# Patient Record
Sex: Female | Born: 1960 | Race: White | Hispanic: No | Smoking: Current some day smoker
Health system: Southern US, Community
[De-identification: ages and names within clinical notes are randomized; demographics above are authoritative.]

## PROBLEM LIST (undated history)

## (undated) DIAGNOSIS — Z5189 Encounter for other specified aftercare: Secondary | ICD-10-CM

## (undated) DIAGNOSIS — J189 Pneumonia, unspecified organism: Secondary | ICD-10-CM

## (undated) DIAGNOSIS — K279 Peptic ulcer, site unspecified, unspecified as acute or chronic, without hemorrhage or perforation: Secondary | ICD-10-CM

## (undated) DIAGNOSIS — M1990 Unspecified osteoarthritis, unspecified site: Secondary | ICD-10-CM

## (undated) DIAGNOSIS — F319 Bipolar disorder, unspecified: Secondary | ICD-10-CM

## (undated) DIAGNOSIS — G894 Chronic pain syndrome: Secondary | ICD-10-CM

## (undated) DIAGNOSIS — R519 Headache, unspecified: Secondary | ICD-10-CM

## (undated) DIAGNOSIS — B1920 Unspecified viral hepatitis C without hepatic coma: Secondary | ICD-10-CM

## (undated) DIAGNOSIS — D72819 Decreased white blood cell count, unspecified: Secondary | ICD-10-CM

## (undated) DIAGNOSIS — K219 Gastro-esophageal reflux disease without esophagitis: Secondary | ICD-10-CM

## (undated) DIAGNOSIS — I10 Essential (primary) hypertension: Secondary | ICD-10-CM

## (undated) DIAGNOSIS — Z8719 Personal history of other diseases of the digestive system: Secondary | ICD-10-CM

## (undated) DIAGNOSIS — R569 Unspecified convulsions: Secondary | ICD-10-CM

## (undated) DIAGNOSIS — T4145XA Adverse effect of unspecified anesthetic, initial encounter: Secondary | ICD-10-CM

## (undated) DIAGNOSIS — F1920 Other psychoactive substance dependence, uncomplicated: Secondary | ICD-10-CM

## (undated) DIAGNOSIS — F419 Anxiety disorder, unspecified: Secondary | ICD-10-CM

## (undated) DIAGNOSIS — R51 Headache: Secondary | ICD-10-CM

## (undated) DIAGNOSIS — T8859XA Other complications of anesthesia, initial encounter: Secondary | ICD-10-CM

## (undated) DIAGNOSIS — D696 Thrombocytopenia, unspecified: Secondary | ICD-10-CM

## (undated) DIAGNOSIS — M810 Age-related osteoporosis without current pathological fracture: Secondary | ICD-10-CM

## (undated) HISTORY — PX: CHOLECYSTECTOMY: SHX55

## (undated) HISTORY — DX: Thrombocytopenia, unspecified: D69.6

## (undated) HISTORY — DX: Gastro-esophageal reflux disease without esophagitis: K21.9

## (undated) HISTORY — PX: SPINE SURGERY: SHX786

## (undated) HISTORY — DX: Decreased white blood cell count, unspecified: D72.819

## (undated) HISTORY — PX: TONSILECTOMY, ADENOIDECTOMY, BILATERAL MYRINGOTOMY AND TUBES: SHX2538

## (undated) HISTORY — PX: ANTERIOR CERVICAL DECOMP/DISCECTOMY FUSION: SHX1161

## (undated) HISTORY — PX: OTHER SURGICAL HISTORY: SHX169

## (undated) HISTORY — DX: Chronic pain syndrome: G89.4

## (undated) HISTORY — DX: Bipolar disorder, unspecified: F31.9

## (undated) HISTORY — DX: Encounter for other specified aftercare: Z51.89

## (undated) HISTORY — PX: APPENDECTOMY: SHX54

## (undated) HISTORY — PX: ABDOMINAL HYSTERECTOMY: SHX81

## (undated) HISTORY — DX: Age-related osteoporosis without current pathological fracture: M81.0

## (undated) HISTORY — DX: Anxiety disorder, unspecified: F41.9

## (undated) HISTORY — DX: Unspecified osteoarthritis, unspecified site: M19.90

## (undated) HISTORY — DX: Unspecified viral hepatitis C without hepatic coma: B19.20

---

## 1999-04-24 ENCOUNTER — Ambulatory Visit (HOSPITAL_COMMUNITY): Admission: RE | Admit: 1999-04-24 | Discharge: 1999-04-24 | Payer: Self-pay | Admitting: *Deleted

## 2000-02-20 ENCOUNTER — Emergency Department (HOSPITAL_COMMUNITY): Admission: EM | Admit: 2000-02-20 | Discharge: 2000-02-20 | Payer: Self-pay | Admitting: Emergency Medicine

## 2005-11-13 ENCOUNTER — Emergency Department (HOSPITAL_COMMUNITY): Admission: EM | Admit: 2005-11-13 | Discharge: 2005-11-13 | Payer: Self-pay | Admitting: Emergency Medicine

## 2006-11-01 ENCOUNTER — Emergency Department (HOSPITAL_COMMUNITY): Admission: EM | Admit: 2006-11-01 | Discharge: 2006-11-01 | Payer: Self-pay | Admitting: Emergency Medicine

## 2007-04-18 ENCOUNTER — Emergency Department (HOSPITAL_COMMUNITY): Admission: EM | Admit: 2007-04-18 | Discharge: 2007-04-18 | Payer: Self-pay | Admitting: Emergency Medicine

## 2007-05-14 ENCOUNTER — Emergency Department (HOSPITAL_COMMUNITY): Admission: EM | Admit: 2007-05-14 | Discharge: 2007-05-14 | Payer: Self-pay | Admitting: Emergency Medicine

## 2007-06-21 ENCOUNTER — Encounter (INDEPENDENT_AMBULATORY_CARE_PROVIDER_SITE_OTHER): Payer: Self-pay | Admitting: Family Medicine

## 2007-06-21 ENCOUNTER — Ambulatory Visit: Payer: Self-pay | Admitting: Internal Medicine

## 2007-06-21 LAB — CONVERTED CEMR LAB
ALT: 75 units/L — ABNORMAL HIGH (ref 0–35)
AST: 75 units/L — ABNORMAL HIGH (ref 0–37)
Albumin: 4.4 g/dL (ref 3.5–5.2)
Basophils Absolute: 0 10*3/uL (ref 0.0–0.1)
Basophils Relative: 0 % (ref 0–1)
CO2: 16 meq/L — ABNORMAL LOW (ref 19–32)
Calcium: 10 mg/dL (ref 8.4–10.5)
Chloride: 108 meq/L (ref 96–112)
Cholesterol: 163 mg/dL (ref 0–200)
Helicobacter Pylori Antibody-IgG: 0.6
Hemoglobin: 14.8 g/dL (ref 12.0–15.0)
Lymphocytes Relative: 39 % (ref 12–46)
MCHC: 33 g/dL (ref 30.0–36.0)
Monocytes Absolute: 0.7 10*3/uL (ref 0.1–1.0)
Neutro Abs: 4.1 10*3/uL (ref 1.7–7.7)
Neutrophils Relative %: 50 % (ref 43–77)
Potassium: 4.4 meq/L (ref 3.5–5.3)
RBC: 4.46 M/uL (ref 3.87–5.11)
RDW: 13.3 % (ref 11.5–15.5)
Total Protein: 8.5 g/dL — ABNORMAL HIGH (ref 6.0–8.3)

## 2007-07-02 ENCOUNTER — Encounter: Admission: RE | Admit: 2007-07-02 | Discharge: 2007-07-02 | Payer: Self-pay | Admitting: Family Medicine

## 2007-09-01 ENCOUNTER — Ambulatory Visit: Payer: Self-pay | Admitting: Family Medicine

## 2007-09-15 ENCOUNTER — Emergency Department (HOSPITAL_COMMUNITY): Admission: EM | Admit: 2007-09-15 | Discharge: 2007-09-15 | Payer: Self-pay | Admitting: Emergency Medicine

## 2007-10-04 ENCOUNTER — Emergency Department (HOSPITAL_COMMUNITY): Admission: EM | Admit: 2007-10-04 | Discharge: 2007-10-04 | Payer: Self-pay | Admitting: Emergency Medicine

## 2007-10-15 ENCOUNTER — Emergency Department (HOSPITAL_COMMUNITY): Admission: EM | Admit: 2007-10-15 | Discharge: 2007-10-15 | Payer: Self-pay | Admitting: Emergency Medicine

## 2007-10-25 ENCOUNTER — Ambulatory Visit: Payer: Self-pay | Admitting: Family Medicine

## 2007-10-25 ENCOUNTER — Emergency Department (HOSPITAL_COMMUNITY): Admission: EM | Admit: 2007-10-25 | Discharge: 2007-10-25 | Payer: Self-pay | Admitting: Family Medicine

## 2007-12-18 ENCOUNTER — Ambulatory Visit: Payer: Self-pay | Admitting: Internal Medicine

## 2007-12-28 ENCOUNTER — Encounter: Admission: RE | Admit: 2007-12-28 | Discharge: 2007-12-28 | Payer: Self-pay | Admitting: Internal Medicine

## 2008-01-11 ENCOUNTER — Inpatient Hospital Stay (HOSPITAL_COMMUNITY): Admission: RE | Admit: 2008-01-11 | Discharge: 2008-01-13 | Payer: Self-pay | Admitting: Orthopedic Surgery

## 2008-04-24 ENCOUNTER — Emergency Department (HOSPITAL_COMMUNITY): Admission: EM | Admit: 2008-04-24 | Discharge: 2008-04-24 | Payer: Self-pay | Admitting: Emergency Medicine

## 2008-04-26 ENCOUNTER — Ambulatory Visit: Payer: Self-pay | Admitting: *Deleted

## 2008-06-23 ENCOUNTER — Ambulatory Visit: Payer: Self-pay | Admitting: Family Medicine

## 2008-07-31 ENCOUNTER — Ambulatory Visit (HOSPITAL_COMMUNITY): Admission: RE | Admit: 2008-07-31 | Discharge: 2008-07-31 | Payer: Self-pay | Admitting: Internal Medicine

## 2008-08-04 ENCOUNTER — Encounter (INDEPENDENT_AMBULATORY_CARE_PROVIDER_SITE_OTHER): Payer: Self-pay | Admitting: Adult Health

## 2008-08-04 ENCOUNTER — Ambulatory Visit: Payer: Self-pay | Admitting: Internal Medicine

## 2008-08-04 LAB — CONVERTED CEMR LAB
Amphetamine Screen, Ur: NEGATIVE
Benzodiazepines.: NEGATIVE
Cocaine Metabolites: NEGATIVE
Marijuana Metabolite: NEGATIVE
Methadone: NEGATIVE
Phencyclidine (PCP): NEGATIVE

## 2008-08-09 ENCOUNTER — Ambulatory Visit (HOSPITAL_COMMUNITY): Admission: RE | Admit: 2008-08-09 | Discharge: 2008-08-09 | Payer: Self-pay | Admitting: Internal Medicine

## 2008-08-11 ENCOUNTER — Emergency Department (HOSPITAL_COMMUNITY): Admission: EM | Admit: 2008-08-11 | Discharge: 2008-08-12 | Payer: Self-pay | Admitting: *Deleted

## 2008-09-07 ENCOUNTER — Ambulatory Visit: Payer: Self-pay | Admitting: Family Medicine

## 2008-09-11 ENCOUNTER — Emergency Department (HOSPITAL_COMMUNITY): Admission: EM | Admit: 2008-09-11 | Discharge: 2008-09-11 | Payer: Self-pay | Admitting: Emergency Medicine

## 2008-11-01 ENCOUNTER — Encounter (INDEPENDENT_AMBULATORY_CARE_PROVIDER_SITE_OTHER): Payer: Self-pay | Admitting: Adult Health

## 2008-11-01 ENCOUNTER — Ambulatory Visit: Payer: Self-pay | Admitting: Internal Medicine

## 2008-11-01 LAB — CONVERTED CEMR LAB
ALT: 49 units/L — ABNORMAL HIGH (ref 0–35)
CO2: 20 meq/L (ref 19–32)
Calcium: 9.6 mg/dL (ref 8.4–10.5)
Chloride: 106 meq/L (ref 96–112)
Creatinine, Ser: 0.76 mg/dL (ref 0.40–1.20)
Eosinophils Absolute: 0.1 10*3/uL (ref 0.0–0.7)
Glucose, Bld: 88 mg/dL (ref 70–99)
Hemoglobin: 13.8 g/dL (ref 12.0–15.0)
Lymphs Abs: 1.9 10*3/uL (ref 0.7–4.0)
MCV: 99 fL (ref 78.0–100.0)
Monocytes Absolute: 0.5 10*3/uL (ref 0.1–1.0)
Monocytes Relative: 9 % (ref 3–12)
Neutrophils Relative %: 54 % (ref 43–77)
RBC: 3.93 M/uL (ref 3.87–5.11)
Sodium: 140 meq/L (ref 135–145)
TSH: 1.912 microintl units/mL (ref 0.350–4.500)
Total Protein: 8.4 g/dL — ABNORMAL HIGH (ref 6.0–8.3)
WBC: 5.4 10*3/uL (ref 4.0–10.5)

## 2008-11-16 ENCOUNTER — Encounter: Admission: RE | Admit: 2008-11-16 | Discharge: 2008-11-16 | Payer: Self-pay | Admitting: Gastroenterology

## 2009-01-04 ENCOUNTER — Encounter: Admission: RE | Admit: 2009-01-04 | Discharge: 2009-01-04 | Payer: Self-pay | Admitting: Specialist

## 2009-01-15 ENCOUNTER — Ambulatory Visit: Payer: Self-pay | Admitting: Internal Medicine

## 2009-01-15 ENCOUNTER — Encounter (INDEPENDENT_AMBULATORY_CARE_PROVIDER_SITE_OTHER): Payer: Self-pay | Admitting: Adult Health

## 2009-01-15 LAB — CONVERTED CEMR LAB
ALT: 61 units/L — ABNORMAL HIGH (ref 0–35)
Albumin: 4.1 g/dL (ref 3.5–5.2)
CO2: 20 meq/L (ref 19–32)
Calcium: 9.2 mg/dL (ref 8.4–10.5)
Chloride: 110 meq/L (ref 96–112)
Cholesterol: 156 mg/dL (ref 0–200)
HCV Quantitative: 2130000 intl units/mL — ABNORMAL HIGH (ref <43)
INR: 1.1 (ref 0.0–1.5)
Sodium: 142 meq/L (ref 135–145)
Total Protein: 8.1 g/dL (ref 6.0–8.3)

## 2009-01-24 ENCOUNTER — Emergency Department (HOSPITAL_COMMUNITY): Admission: EM | Admit: 2009-01-24 | Discharge: 2009-01-24 | Payer: Self-pay | Admitting: Emergency Medicine

## 2009-02-02 ENCOUNTER — Ambulatory Visit (HOSPITAL_COMMUNITY): Admission: RE | Admit: 2009-02-02 | Discharge: 2009-02-02 | Payer: Self-pay | Admitting: Internal Medicine

## 2009-02-12 ENCOUNTER — Ambulatory Visit: Payer: Self-pay | Admitting: Internal Medicine

## 2009-02-13 ENCOUNTER — Encounter: Admission: RE | Admit: 2009-02-13 | Discharge: 2009-02-13 | Payer: Self-pay | Admitting: Gastroenterology

## 2009-03-01 ENCOUNTER — Ambulatory Visit: Payer: Self-pay | Admitting: Gastroenterology

## 2009-03-06 ENCOUNTER — Ambulatory Visit (HOSPITAL_COMMUNITY): Admission: RE | Admit: 2009-03-06 | Discharge: 2009-03-06 | Payer: Self-pay | Admitting: Internal Medicine

## 2009-03-17 ENCOUNTER — Emergency Department (HOSPITAL_COMMUNITY): Admission: EM | Admit: 2009-03-17 | Discharge: 2009-03-18 | Payer: Self-pay | Admitting: Emergency Medicine

## 2009-03-27 ENCOUNTER — Ambulatory Visit: Payer: Self-pay | Admitting: Internal Medicine

## 2009-03-28 ENCOUNTER — Ambulatory Visit (HOSPITAL_COMMUNITY): Admission: RE | Admit: 2009-03-28 | Discharge: 2009-03-28 | Payer: Self-pay | Admitting: Gastroenterology

## 2009-05-03 ENCOUNTER — Ambulatory Visit: Payer: Self-pay | Admitting: Gastroenterology

## 2009-06-04 ENCOUNTER — Ambulatory Visit: Payer: Self-pay | Admitting: Internal Medicine

## 2009-06-04 ENCOUNTER — Encounter (INDEPENDENT_AMBULATORY_CARE_PROVIDER_SITE_OTHER): Payer: Self-pay | Admitting: Adult Health

## 2009-06-04 LAB — CONVERTED CEMR LAB
Barbiturate Quant, Ur: NEGATIVE
Cocaine Metabolites: NEGATIVE
Creatinine,U: 316.9 mg/dL
Phencyclidine (PCP): NEGATIVE

## 2009-06-14 ENCOUNTER — Ambulatory Visit: Payer: Self-pay | Admitting: Internal Medicine

## 2009-06-14 ENCOUNTER — Ambulatory Visit: Payer: Self-pay | Admitting: Gastroenterology

## 2009-06-14 ENCOUNTER — Encounter (INDEPENDENT_AMBULATORY_CARE_PROVIDER_SITE_OTHER): Payer: Self-pay | Admitting: Adult Health

## 2009-06-14 LAB — CONVERTED CEMR LAB
ALT: 54 units/L — ABNORMAL HIGH (ref 0–35)
CO2: 21 meq/L (ref 19–32)
Chloride: 105 meq/L (ref 96–112)
Sodium: 136 meq/L (ref 135–145)
Total Bilirubin: 0.5 mg/dL (ref 0.3–1.2)
Total Protein: 7.5 g/dL (ref 6.0–8.3)

## 2009-07-31 ENCOUNTER — Ambulatory Visit (HOSPITAL_COMMUNITY): Admission: RE | Admit: 2009-07-31 | Discharge: 2009-07-31 | Payer: Self-pay | Admitting: *Deleted

## 2009-09-20 ENCOUNTER — Inpatient Hospital Stay (HOSPITAL_COMMUNITY): Admission: RE | Admit: 2009-09-20 | Discharge: 2009-09-21 | Payer: Self-pay | Admitting: *Deleted

## 2009-10-09 ENCOUNTER — Encounter: Admission: RE | Admit: 2009-10-09 | Discharge: 2009-10-09 | Payer: Self-pay | Admitting: *Deleted

## 2009-10-30 ENCOUNTER — Ambulatory Visit (HOSPITAL_COMMUNITY): Admission: RE | Admit: 2009-10-30 | Discharge: 2009-10-30 | Payer: Self-pay | Admitting: Gastroenterology

## 2009-11-15 ENCOUNTER — Ambulatory Visit (HOSPITAL_COMMUNITY): Admission: RE | Admit: 2009-11-15 | Discharge: 2009-11-15 | Payer: Self-pay | Admitting: *Deleted

## 2009-11-16 ENCOUNTER — Ambulatory Visit: Payer: Self-pay | Admitting: Gastroenterology

## 2009-12-20 ENCOUNTER — Ambulatory Visit: Payer: Self-pay | Admitting: Internal Medicine

## 2010-02-22 ENCOUNTER — Emergency Department (HOSPITAL_COMMUNITY): Admission: EM | Admit: 2010-02-22 | Discharge: 2010-02-22 | Payer: Self-pay | Admitting: Emergency Medicine

## 2010-02-28 ENCOUNTER — Ambulatory Visit: Payer: Self-pay | Admitting: Cardiology

## 2010-02-28 ENCOUNTER — Observation Stay (HOSPITAL_COMMUNITY): Admission: EM | Admit: 2010-02-28 | Discharge: 2010-03-01 | Payer: Self-pay | Admitting: Emergency Medicine

## 2010-03-07 ENCOUNTER — Emergency Department (HOSPITAL_COMMUNITY)
Admission: EM | Admit: 2010-03-07 | Discharge: 2010-03-07 | Payer: Self-pay | Source: Home / Self Care | Admitting: Emergency Medicine

## 2010-03-21 ENCOUNTER — Ambulatory Visit: Payer: Self-pay | Admitting: Oncology

## 2010-03-22 LAB — CBC WITH DIFFERENTIAL/PLATELET
BASO%: 0.3 % (ref 0.0–2.0)
EOS%: 0.5 % (ref 0.0–7.0)
MCH: 37.9 pg — ABNORMAL HIGH (ref 25.1–34.0)
MCHC: 34.2 g/dL (ref 31.5–36.0)
MCV: 110.9 fL — ABNORMAL HIGH (ref 79.5–101.0)
MONO%: 10.8 % (ref 0.0–14.0)
NEUT#: 1.8 10*3/uL (ref 1.5–6.5)
RBC: 3.93 10*6/uL (ref 3.70–5.45)
RDW: 13.7 % (ref 11.2–14.5)
nRBC: 0 % (ref 0–0)

## 2010-03-22 LAB — MORPHOLOGY

## 2010-03-25 ENCOUNTER — Encounter (INDEPENDENT_AMBULATORY_CARE_PROVIDER_SITE_OTHER): Payer: Self-pay | Admitting: *Deleted

## 2010-03-25 LAB — CONVERTED CEMR LAB
Barbiturate Quant, Ur: POSITIVE — AB
Benzodiazepines.: NEGATIVE
Marijuana Metabolite: NEGATIVE
Methadone: NEGATIVE
Opiate Screen, Urine: POSITIVE — AB
Propoxyphene: NEGATIVE

## 2010-03-26 LAB — COMPREHENSIVE METABOLIC PANEL
Albumin: 4.1 g/dL (ref 3.5–5.2)
BUN: 4 mg/dL — ABNORMAL LOW (ref 6–23)
CO2: 23 mEq/L (ref 19–32)
Calcium: 9.2 mg/dL (ref 8.4–10.5)
Chloride: 107 mEq/L (ref 96–112)
Creatinine, Ser: 0.56 mg/dL (ref 0.40–1.20)
Potassium: 3.9 mEq/L (ref 3.5–5.3)

## 2010-03-26 LAB — PROTEIN ELECTROPHORESIS, SERUM
Albumin ELP: 46.9 % — ABNORMAL LOW (ref 55.8–66.1)
Beta Globulin: 5.5 % (ref 4.7–7.2)
Total Protein, Serum Electrophoresis: 7.6 g/dL (ref 6.0–8.3)

## 2010-03-26 LAB — HEPATITIS B SURFACE ANTIBODY,QUALITATIVE: Hep B S Ab: NEGATIVE

## 2010-03-26 LAB — HEPATITIS B CORE ANTIBODY, TOTAL: Hep B Core Total Ab: NEGATIVE

## 2010-03-26 LAB — LACTATE DEHYDROGENASE: LDH: 155 U/L (ref 94–250)

## 2010-03-26 LAB — VITAMIN B12: Vitamin B-12: 389 pg/mL (ref 211–911)

## 2010-03-26 LAB — HEPATITIS B SURFACE ANTIGEN: Hepatitis B Surface Ag: NEGATIVE

## 2010-04-03 ENCOUNTER — Encounter: Admission: RE | Admit: 2010-04-03 | Discharge: 2010-04-03 | Payer: Self-pay | Admitting: Gastroenterology

## 2010-04-12 ENCOUNTER — Ambulatory Visit (HOSPITAL_COMMUNITY): Admission: RE | Admit: 2010-04-12 | Discharge: 2010-04-12 | Payer: Self-pay | Admitting: Oncology

## 2010-04-29 ENCOUNTER — Ambulatory Visit (HOSPITAL_COMMUNITY)
Admission: RE | Admit: 2010-04-29 | Discharge: 2010-04-29 | Payer: Self-pay | Source: Home / Self Care | Admitting: Family Medicine

## 2010-05-01 ENCOUNTER — Ambulatory Visit (HOSPITAL_BASED_OUTPATIENT_CLINIC_OR_DEPARTMENT_OTHER): Payer: Medicare Other | Admitting: Oncology

## 2010-05-03 LAB — CBC WITH DIFFERENTIAL/PLATELET
Basophils Absolute: 0 10*3/uL (ref 0.0–0.1)
EOS%: 1.2 % (ref 0.0–7.0)
HGB: 12.5 g/dL (ref 11.6–15.9)
MCH: 37.1 pg — ABNORMAL HIGH (ref 25.1–34.0)
MCV: 110.4 fL — ABNORMAL HIGH (ref 79.5–101.0)
MONO%: 13.3 % (ref 0.0–14.0)
RBC: 3.37 10*6/uL — ABNORMAL LOW (ref 3.70–5.45)
RDW: 13.1 % (ref 11.2–14.5)

## 2010-05-26 HISTORY — PX: JOINT REPLACEMENT: SHX530

## 2010-06-16 ENCOUNTER — Encounter: Payer: Self-pay | Admitting: Orthopedic Surgery

## 2010-06-16 ENCOUNTER — Encounter: Payer: Self-pay | Admitting: Gastroenterology

## 2010-06-17 ENCOUNTER — Encounter: Payer: Self-pay | Admitting: Internal Medicine

## 2010-06-17 ENCOUNTER — Encounter: Payer: Self-pay | Admitting: Gastroenterology

## 2010-07-01 ENCOUNTER — Ambulatory Visit: Payer: Medicare Other | Admitting: Oncology

## 2010-07-01 DIAGNOSIS — D696 Thrombocytopenia, unspecified: Secondary | ICD-10-CM

## 2010-07-01 LAB — CBC WITH DIFFERENTIAL/PLATELET
Basophils Absolute: 0 10*3/uL (ref 0.0–0.1)
HCT: 36.8 % (ref 34.8–46.6)
HGB: 12.6 g/dL (ref 11.6–15.9)
MONO#: 0.4 10*3/uL (ref 0.1–0.9)
NEUT#: 4.4 10*3/uL (ref 1.5–6.5)
NEUT%: 69.5 % (ref 38.4–76.8)
RDW: 12.9 % (ref 11.2–14.5)
WBC: 6.3 10*3/uL (ref 3.9–10.3)
lymph#: 1.4 10*3/uL (ref 0.9–3.3)

## 2010-07-01 LAB — MORPHOLOGY: PLT EST: DECREASED

## 2010-07-25 ENCOUNTER — Encounter: Payer: Self-pay | Admitting: Internal Medicine

## 2010-07-25 LAB — CONVERTED CEMR LAB
Creatinine,U: 108.5 mg/dL
Methadone: NEGATIVE
Opiate Screen, Urine: POSITIVE — AB
Phencyclidine (PCP): NEGATIVE
Propoxyphene: NEGATIVE

## 2010-08-06 LAB — CBC
Hemoglobin: 12.4 g/dL (ref 12.0–15.0)
MCHC: 35.2 g/dL (ref 30.0–36.0)
Platelets: 80 10*3/uL — ABNORMAL LOW (ref 150–400)
RDW: 13.3 % (ref 11.5–15.5)

## 2010-08-06 LAB — PROTIME-INR
INR: 1.1 (ref 0.00–1.49)
Prothrombin Time: 14.4 seconds (ref 11.6–15.2)

## 2010-08-07 LAB — POCT CARDIAC MARKERS
CKMB, poc: <1 ng/mL — ABNORMAL LOW (ref 1.0–8.0)
Myoglobin, poc: 50.6 ng/mL (ref 12–200)
Troponin i, poc: <0.05 ng/mL (ref 0.00–0.09)

## 2010-08-08 LAB — COMPREHENSIVE METABOLIC PANEL
ALT: 39 U/L — ABNORMAL HIGH (ref 0–35)
AST: 60 U/L — ABNORMAL HIGH (ref 0–37)
CO2: 26 mEq/L (ref 19–32)
Calcium: 8.7 mg/dL (ref 8.4–10.5)
Creatinine, Ser: 0.6 mg/dL (ref 0.4–1.2)
GFR calc Af Amer: >60 mL/min (ref >60)
GFR calc non Af Amer: >60 mL/min (ref >60)
Sodium: 140 mEq/L (ref 135–145)
Total Protein: 6.8 g/dL (ref 6.0–8.3)

## 2010-08-08 LAB — DIFFERENTIAL
Basophils Relative: 0 % (ref 0–1)
Eosinophils Absolute: 0 10*3/uL (ref 0.0–0.7)
Eosinophils Relative: 1 % (ref 0–5)
Lymphocytes Relative: 51 % — ABNORMAL HIGH (ref 12–46)
Neutrophils Relative %: 39 % — ABNORMAL LOW (ref 43–77)

## 2010-08-08 LAB — CBC
Hemoglobin: 11.6 g/dL — ABNORMAL LOW (ref 12.0–15.0)
Hemoglobin: 13.8 g/dL (ref 12.0–15.0)
MCH: 38.3 pg — ABNORMAL HIGH (ref 26.0–34.0)
MCHC: 34.5 g/dL (ref 30.0–36.0)
MCHC: 34.9 g/dL (ref 30.0–36.0)
Platelets: 77 10*3/uL — ABNORMAL LOW (ref 150–400)
Platelets: 90 10*3/uL — ABNORMAL LOW (ref 150–400)
RBC: 3.56 MIL/uL — ABNORMAL LOW (ref 3.87–5.11)
RDW: 15.5 % (ref 11.5–15.5)

## 2010-08-08 LAB — POCT I-STAT, CHEM 8
BUN: 3 mg/dL — ABNORMAL LOW (ref 6–23)
Calcium, Ion: 1.17 mmol/L (ref 1.12–1.32)
Creatinine, Ser: 0.5 mg/dL (ref 0.4–1.2)
Hemoglobin: 14.6 g/dL (ref 12.0–15.0)
Sodium: 141 mEq/L (ref 135–145)
TCO2: 25 mmol/L (ref 0–100)

## 2010-08-08 LAB — CARDIAC PANEL(CRET KIN+CKTOT+MB+TROPI)
CK, MB: 0.6 ng/mL (ref 0.3–4.0)
CK, MB: 0.7 ng/mL (ref 0.3–4.0)
Relative Index: INVALID (ref 0.0–2.5)
Relative Index: INVALID (ref 0.0–2.5)
Total CK: 58 U/L (ref 7–177)
Total CK: 63 U/L (ref 7–177)
Troponin I: 0.02 ng/mL (ref 0.00–0.06)
Troponin I: 0.02 ng/mL (ref 0.00–0.06)

## 2010-08-08 LAB — MAGNESIUM: Magnesium: 1.7 mg/dL (ref 1.5–2.5)

## 2010-08-08 LAB — POCT CARDIAC MARKERS: Myoglobin, poc: 53 ng/mL (ref 12–200)

## 2010-08-08 LAB — LIPID PANEL
LDL Cholesterol: 48 mg/dL (ref 0–99)
Total CHOL/HDL Ratio: 4.5 RATIO
VLDL: 60 mg/dL — ABNORMAL HIGH (ref 0–40)

## 2010-08-08 LAB — CK TOTAL AND CKMB (NOT AT ARMC): Total CK: 51 U/L (ref 7–177)

## 2010-08-08 LAB — PHOSPHORUS: Phosphorus: 2.6 mg/dL (ref 2.3–4.6)

## 2010-08-13 LAB — CBC
HCT: 40.2 % (ref 36.0–46.0)
Hemoglobin: 14.2 g/dL (ref 12.0–15.0)
MCV: 103.9 fL — ABNORMAL HIGH (ref 78.0–100.0)
RDW: 13.9 % (ref 11.5–15.5)

## 2010-08-13 LAB — URINALYSIS, ROUTINE W REFLEX MICROSCOPIC
Bilirubin Urine: NEGATIVE
Glucose, UA: NEGATIVE mg/dL
Hgb urine dipstick: NEGATIVE
Nitrite: NEGATIVE
Specific Gravity, Urine: 1.019 (ref 1.005–1.030)
pH: 6 (ref 5.0–8.0)

## 2010-08-13 LAB — COMPREHENSIVE METABOLIC PANEL
Alkaline Phosphatase: 73 U/L (ref 39–117)
BUN: 4 mg/dL — ABNORMAL LOW (ref 6–23)
Chloride: 105 mEq/L (ref 96–112)
Creatinine, Ser: 0.63 mg/dL (ref 0.4–1.2)
Glucose, Bld: 95 mg/dL (ref 70–99)
Potassium: 4.3 mEq/L (ref 3.5–5.1)
Total Bilirubin: 0.6 mg/dL (ref 0.3–1.2)

## 2010-08-13 LAB — APTT: aPTT: 31 seconds (ref 24–37)

## 2010-08-13 LAB — PROTIME-INR
INR: 1.08 (ref 0.00–1.49)
Prothrombin Time: 13.9 seconds (ref 11.6–15.2)

## 2010-08-13 LAB — DIFFERENTIAL
Basophils Absolute: 0 10*3/uL (ref 0.0–0.1)
Basophils Relative: 1 % (ref 0–1)
Lymphocytes Relative: 51 % — ABNORMAL HIGH (ref 12–46)
Neutro Abs: 1.1 10*3/uL — ABNORMAL LOW (ref 1.7–7.7)
Neutrophils Relative %: 38 % — ABNORMAL LOW (ref 43–77)

## 2010-08-13 LAB — TYPE AND SCREEN: ABO/RH(D): A POS

## 2010-08-28 LAB — PROTIME-INR
INR: 1.1 (ref 0.00–1.49)
Prothrombin Time: 14.1 seconds (ref 11.6–15.2)

## 2010-08-28 LAB — CBC
HCT: 37.7 % (ref 36.0–46.0)
Hemoglobin: 13.2 g/dL (ref 12.0–15.0)
MCHC: 35 g/dL (ref 30.0–36.0)
MCV: 103.5 fL — ABNORMAL HIGH (ref 78.0–100.0)
RBC: 3.64 MIL/uL — ABNORMAL LOW (ref 3.87–5.11)

## 2010-08-30 LAB — CBC
MCHC: 34.6 g/dL (ref 30.0–36.0)
MCV: 103 fL — ABNORMAL HIGH (ref 78.0–100.0)
Platelets: 108 10*3/uL — ABNORMAL LOW (ref 150–400)
RDW: 13.8 % (ref 11.5–15.5)

## 2010-08-30 LAB — COMPREHENSIVE METABOLIC PANEL
AST: 63 U/L — ABNORMAL HIGH (ref 0–37)
Albumin: 3.8 g/dL (ref 3.5–5.2)
BUN: 6 mg/dL (ref 6–23)
Calcium: 9.8 mg/dL (ref 8.4–10.5)
Creatinine, Ser: 0.64 mg/dL (ref 0.4–1.2)
GFR calc Af Amer: >60 mL/min (ref >60)

## 2010-08-30 LAB — DIFFERENTIAL
Eosinophils Relative: 1 % (ref 0–5)
Lymphocytes Relative: 31 % (ref 12–46)
Lymphs Abs: 1.3 10*3/uL (ref 0.7–4.0)
Monocytes Absolute: 0.4 10*3/uL (ref 0.1–1.0)
Neutro Abs: 2.5 10*3/uL (ref 1.7–7.7)

## 2010-08-30 LAB — URINALYSIS, ROUTINE W REFLEX MICROSCOPIC
Bilirubin Urine: NEGATIVE
Hgb urine dipstick: NEGATIVE
Ketones, ur: NEGATIVE mg/dL
Protein, ur: NEGATIVE mg/dL
Urobilinogen, UA: 0.2 mg/dL (ref 0.0–1.0)

## 2010-08-30 LAB — HEMOCCULT GUIAC POC 1CARD (OFFICE): Fecal Occult Bld: NEGATIVE

## 2010-09-04 LAB — COMPREHENSIVE METABOLIC PANEL
ALT: 40 U/L — ABNORMAL HIGH (ref 0–35)
Alkaline Phosphatase: 83 U/L (ref 39–117)
BUN: 8 mg/dL (ref 6–23)
CO2: 27 mEq/L (ref 19–32)
GFR calc non Af Amer: >60 mL/min (ref >60)
Glucose, Bld: 98 mg/dL (ref 70–99)
Potassium: 3.7 mEq/L (ref 3.5–5.1)
Sodium: 141 mEq/L (ref 135–145)
Total Bilirubin: 1 mg/dL (ref 0.3–1.2)
Total Protein: 8.2 g/dL (ref 6.0–8.3)

## 2010-09-04 LAB — URINALYSIS, ROUTINE W REFLEX MICROSCOPIC
Glucose, UA: NEGATIVE mg/dL
Ketones, ur: 40 mg/dL — AB
Nitrite: NEGATIVE
Protein, ur: NEGATIVE mg/dL
Urobilinogen, UA: 1 mg/dL (ref 0.0–1.0)

## 2010-09-04 LAB — CBC
HCT: 41.8 % (ref 36.0–46.0)
Hemoglobin: 14.6 g/dL (ref 12.0–15.0)
MCHC: 34.8 g/dL (ref 30.0–36.0)
RBC: 4.15 MIL/uL (ref 3.87–5.11)
RDW: 14 % (ref 11.5–15.5)

## 2010-09-04 LAB — DIFFERENTIAL
Basophils Absolute: 0 10*3/uL (ref 0.0–0.1)
Basophils Relative: 0 % (ref 0–1)
Eosinophils Absolute: 0 10*3/uL (ref 0.0–0.7)
Monocytes Relative: 8 % (ref 3–12)
Neutro Abs: 3.2 10*3/uL (ref 1.7–7.7)
Neutrophils Relative %: 61 % (ref 43–77)

## 2010-09-04 LAB — LIPASE, BLOOD: Lipase: 17 U/L (ref 11–59)

## 2010-09-04 LAB — HEMOCCULT GUIAC POC 1CARD (OFFICE): Fecal Occult Bld: POSITIVE

## 2010-09-04 LAB — APTT: aPTT: 28 seconds (ref 24–37)

## 2010-09-05 LAB — POCT CARDIAC MARKERS
Myoglobin, poc: 62.1 ng/mL (ref 12–200)
Troponin i, poc: <0.05 ng/mL (ref 0.00–0.09)

## 2010-09-05 LAB — DIFFERENTIAL
Basophils Relative: 0 % (ref 0–1)
Eosinophils Absolute: 0 10*3/uL (ref 0.0–0.7)
Eosinophils Relative: 1 % (ref 0–5)
Lymphs Abs: 1.4 10*3/uL (ref 0.7–4.0)
Monocytes Relative: 8 % (ref 3–12)

## 2010-09-05 LAB — CBC
HCT: 40.2 % (ref 36.0–46.0)
MCHC: 35 g/dL (ref 30.0–36.0)
MCV: 100.2 fL — ABNORMAL HIGH (ref 78.0–100.0)
Platelets: 128 10*3/uL — ABNORMAL LOW (ref 150–400)
WBC: 4.9 10*3/uL (ref 4.0–10.5)

## 2010-09-05 LAB — BASIC METABOLIC PANEL
BUN: 9 mg/dL (ref 6–23)
CO2: 23 mEq/L (ref 19–32)
Chloride: 103 mEq/L (ref 96–112)
Creatinine, Ser: 0.62 mg/dL (ref 0.4–1.2)
Potassium: 3.6 mEq/L (ref 3.5–5.1)

## 2010-09-12 ENCOUNTER — Encounter (HOSPITAL_BASED_OUTPATIENT_CLINIC_OR_DEPARTMENT_OTHER): Payer: Medicare Other | Admitting: Oncology

## 2010-09-12 ENCOUNTER — Other Ambulatory Visit: Payer: Self-pay | Admitting: Oncology

## 2010-09-12 DIAGNOSIS — K7460 Unspecified cirrhosis of liver: Secondary | ICD-10-CM

## 2010-09-12 DIAGNOSIS — D72819 Decreased white blood cell count, unspecified: Secondary | ICD-10-CM

## 2010-09-12 DIAGNOSIS — D696 Thrombocytopenia, unspecified: Secondary | ICD-10-CM

## 2010-09-12 DIAGNOSIS — B1920 Unspecified viral hepatitis C without hepatic coma: Secondary | ICD-10-CM

## 2010-09-12 LAB — MORPHOLOGY: RBC Comments: NORMAL

## 2010-09-12 LAB — COMPREHENSIVE METABOLIC PANEL
AST: 48 U/L — ABNORMAL HIGH (ref 0–37)
Alkaline Phosphatase: 82 U/L (ref 39–117)
BUN: 6 mg/dL (ref 6–23)
Calcium: 9.4 mg/dL (ref 8.4–10.5)
Chloride: 105 mEq/L (ref 96–112)
Creatinine, Ser: 0.68 mg/dL (ref 0.40–1.20)
Total Bilirubin: 0.5 mg/dL (ref 0.3–1.2)

## 2010-09-12 LAB — CBC WITH DIFFERENTIAL/PLATELET
BASO%: 0.5 % (ref 0.0–2.0)
Eosinophils Absolute: 0.1 10*3/uL (ref 0.0–0.5)
MCHC: 33.9 g/dL (ref 31.5–36.0)
MONO#: 0.3 10*3/uL (ref 0.1–0.9)
NEUT#: 1.9 10*3/uL (ref 1.5–6.5)
RBC: 3.84 10*6/uL (ref 3.70–5.45)
RDW: 14.3 % (ref 11.2–14.5)
WBC: 3.7 10*3/uL — ABNORMAL LOW (ref 3.9–10.3)
lymph#: 1.4 10*3/uL (ref 0.9–3.3)
nRBC: 0 % (ref 0–0)

## 2010-09-12 LAB — CHCC SMEAR

## 2010-10-01 ENCOUNTER — Ambulatory Visit: Payer: No Typology Code available for payment source | Admitting: Physical Therapy

## 2010-10-08 NOTE — Op Note (Signed)
[name], [name]                 ACCOUNT NO.:  [id number]   MEDICAL RECORD NO.:  [id number]          PATIENT TYPE:  INP   LOCATION:  1611                         FACILITY:  [facility]   PHYSICIAN:  Madlyn Frankel. Charlann Boxer, M.D.  DATE OF BIRTH:  [birth year]   DATE OF PROCEDURE:  01/11/2008  DATE OF DISCHARGE:                               OPERATIVE REPORT   PREOPERATIVE DIAGNOSIS:  Right hip avascular necrosis.   POSTOPERATIVE DIAGNOSIS:  Right hip avascular necrosis.   PROCEDURE:  Right total hip replacement.   COMPONENTS USED:  DePuy hip system, size 50 pinnacle cup, 36 metal liner  neutral.  A Tri-Lock size 8 high offset stem with a 36+5 ball.   SURGEON:  Madlyn Frankel. Charlann Boxer, M.D.   ASSISTANT:  Yetta Glassman. Mann, PA.   ANESTHESIA:  General.   BLOOD LOSS:  300 mL.   DRAINS:  One.   COMPLICATIONS:  None.   INDICATION FOR PROCEDURE:  [name] is a 50 year old female who  presented to the office for evaluation of bilateral right greater than  left hip pain with radiographic changes consistent with avascular  necrosis with advanced collapse.  She had significant pain.  She was  initially seen and evaluated earlier this year.  Once she was able to  establish some balance in her life, including a significant reduction in  the narcotic use, getting medical clearance and getting things  straightened, she was ready to proceed with surgery.  We had reviewed  the risks of infection, DVT, component failure, dislocation, need for  revision surgery for any issues.  We discussed her age and the bearing  surfaces available.  She consented for a metal-on-metal total hip  replacement.  Consent obtained.   PROCEDURE IN DETAIL:  The patient was brought to the operative theater.  Once adequate anesthesia, preoperative antibiotics, Ancef, administered,  the patient was positioned in the left lateral decubitus position with  the right side up.  Once positioned and bony prominence padded, she was  identified through a timeout.  The right lateral hip was pre-scrubbed  and prepped and draped in a sterile fashion.  A lateral based incision  was made for a posterior approach to the hip.  The iliotibial band and  gluteus fascia were then incised posteriorly.  The short external  rotators were taken down separate from the posterior capsule.  An L  capsulotomy was made, preserving the posterior leaflet for later  anatomic repair, as well as protection of the sciatic nerve from  retractors.  The hip was dislocated and severe avascular changes noted  in the femoral head.  A neck osteotomy was made based off anatomic  landmarks and preoperative templating.   Attention was first directed to the femur, where I had used a starting  drill to open the canal, irrigated the canal to prevent fat emboli.  I  then began broaching, initially broaching to a size 6.  This was at the  level of my neck cut.  I then used the calcar miller just to debride  some of the anterior bone left.  I  then packed off the femur with a  sponge and now attended to the acetabulum.  Following acetabular  exposure and placement of retractors, the labrum was removed.  I began  reaming with a 44 reamer, then 46, then 47 and 49.  There was very good  bone preparation.  Her acetabulum was a bit on the shallow side, but was  there still good coverage.   At this point, the final 50 mm pinnacle cup was impacted in  approximately 35-40 degrees of abduction and 20 degrees of forward  flexion.  It was beneath the anterior wall anteriorly at the level of  the ischium posteriorly.  With this, I placed two cancellous screws and  a neutral trial liner.  At this point, I did a trial reduction with 6  broach at the level where it sat at the level of the neck cut, using a  high offset neck and a 36+1.5 ball.  The combined anteversion was very  good at 45-50 degrees, but there was a significant amount of shuck  indicating length inequality.   At this point, I went ahead and removed  the trial components.  Based on my combined anteversion that was set, I  went ahead and placed a central hole eliminator in the acetabulum and  impacted the final 36 x 50 mm pinnacle metal liner.   This was done without complication.   At this point, I broached to a 7, which sat basically at the level where  the neck cut was.  Then I used __________needed a little bit of length.  I went ahead and did a trial reduction.  Based on this, I chose the 8  high offset stem.  I then impacted this.  It sat about 2-3 mm proud of  my neck cut, giving me a little bit of length.  I trialed again with a  +1.5 and then a 5 ball.  Again, the stability was very good with a  combined anteversion as previously noted and there was only 1-2 mm of  shuck.  The leg lengths appeared to be very similar to the way they were  positioned at the preoperative portion of the case.  Given this, I chose  a 36+5 ball.  It was impacted onto the clean and dry trunnion.   At this point, the hip was irrigated.  I placed a medium Hemovac drain  deep.  I reapproximated t6 posterior capsule to the superior leaflet  using #1 Ethibond.  The remaining wound was closed with #1 Vicryl on the  iliotibial band and running on the gluteal fascia.  A 2-0 Vicryl was  used in the subcu layer and a running 4-0 Monocryl on the skin.  The  skin was cleaned, dried and dressed sterilely with Steri-Strips and a  sterile wrap.  She was transferred from the recovery room, extubated in  stable condition, tolerating the procedure well.      Madlyn Frankel Charlann Boxer, M.D.  Electronically Signed     MDO/MEDQ  D:  01/11/2008  T:  01/12/2008  Job:  [id number]

## 2010-10-08 NOTE — H&P (Signed)
[name], [name]                 ACCOUNT NO.:  [id number]   MEDICAL RECORD NO.:  [id number]          PATIENT TYPE:  INP   LOCATION:  NA                           FACILITY:  [facility]   PHYSICIAN:  Madlyn Frankel. Charlann Boxer, M.D.  DATE OF BIRTH:  11/06/1960   DATE OF ADMISSION:  DATE OF DISCHARGE:                              HISTORY & PHYSICAL   PROCEDURE:  Right total hip arthroplasty.   ATTENDING PHYSICIAN:  Dr. Durene Romans.   CHIEF COMPLAINTS:  Right hip pain.   HISTORY OF PRESENT ILLNESS:  A 50 year old female with a history of  right hip pain secondary to avascular necrosis refractory to all  conservative treatment.  She has been presurgically assessed by her  primary care physician, Dr. Donia Guiles.   PAST MEDICAL HISTORY:  Includes:  1. Osteoarthritis.  2. Bipolar.   PAST SURGICAL HISTORY:  1. Hysterectomy.  2. Cholecystectomy,.  3. Appendectomy.   FAMILY HISTORY:  Coronary artery disease, breast cancer.   SOCIAL HISTORY:  Divorced.  Primary caregiver after surgery will be  family member in the home.   DRUG ALLERGIES:  ASPIRIN.   CURRENT MEDICATIONS:  Seroquel 400 mg 1 p.o. every night.   REVIEW OF SYSTEMS:  GENERAL:  She has some loss of memory, night sweats  and fatigue.  GASTROINTESTINAL:  She has intermittent heartburn.  MUSCULOSKELETAL:  Multiple joint pains, some back pain, morning  stiffness as well as muscular weakness.  Otherwise see HPI.   PHYSICAL EXAMINATION:  Pulse 72, respirations 16, blood pressure 122/84.  Height 5 feet 8, weight 189 pounds.  GENERAL:  Awake, alert and oriented, well-developed, well-nourished, no  acute distress.  NECK:  Supple.  No carotid bruits.  CHEST:  Lungs are clear to auscultation bilaterally.  BREASTS:  Deferred.  HEART:  Regular rate and rhythm.  S1-S2 distinct.  ABDOMEN:  Soft, nontender, bowel sounds present.  GENITOURINARY:  Deferred.  EXTREMITIES:  Right hip has increased pain with decreased range of  motion.  SKIN:   No cellulitis.  NEUROLOGIC:  Intact distal sensibilities.   Labs, EKG, chest x-ray all pending presurgical testing.   IMPRESSION:  Right hip avascular necrosis.   PLAN OF ACTION:  Right total hip arthroplasty at [facility]  January 11, 2008 by surgeon Dr. Durene Romans.  Risks and complications  were discussed.   Postoperative medications were provided which include Coumadin for DVT  prophylaxis, Robaxin, iron, Colace and MiraLax.  Pain medicines to be  dispensed in conjunction with the pain medicine contract.     ______________________________  [name]. [name], [name]      Madlyn Frankel. Charlann Boxer, M.D.  Electronically Signed    BLM/MEDQ  D:  01/07/2008  T:  01/07/2008  Job:  [id number]   cc:   Dineen Kid. Reche Dixon, M.D.  Fax: [phone]

## 2010-10-10 ENCOUNTER — Ambulatory Visit: Payer: No Typology Code available for payment source | Attending: Orthopedic Surgery | Admitting: Physical Therapy

## 2010-10-10 DIAGNOSIS — M542 Cervicalgia: Secondary | ICD-10-CM | POA: Insufficient documentation

## 2010-10-10 DIAGNOSIS — M2560 Stiffness of unspecified joint, not elsewhere classified: Secondary | ICD-10-CM | POA: Insufficient documentation

## 2010-10-10 DIAGNOSIS — IMO0001 Reserved for inherently not codable concepts without codable children: Secondary | ICD-10-CM | POA: Insufficient documentation

## 2010-10-10 DIAGNOSIS — C228 Malignant neoplasm of liver, primary, unspecified as to type: Secondary | ICD-10-CM | POA: Insufficient documentation

## 2010-10-10 DIAGNOSIS — M545 Low back pain, unspecified: Secondary | ICD-10-CM | POA: Insufficient documentation

## 2010-10-10 DIAGNOSIS — R209 Unspecified disturbances of skin sensation: Secondary | ICD-10-CM | POA: Insufficient documentation

## 2010-10-11 NOTE — Discharge Summary (Signed)
NAMEDALEYZA, GADOMSKI                 ACCOUNT NO.:  [id number]   MEDICAL RECORD NO.:  [id number]          PATIENT TYPE:  INP   LOCATION:  1611                         FACILITY:  [facility]   PHYSICIAN:  Madlyn Frankel. Charlann Boxer, M.D.  DATE OF BIRTH:  12-11-1960   DATE OF ADMISSION:  01/11/2008  DATE OF DISCHARGE:  01/13/2008                               DISCHARGE SUMMARY   ADMITTING DIAGNOSES:  1. Avascular necrosis.  2. Bipolar disease.   DISCHARGE DIAGNOSIS:  1. Avascular necrosis.  2. Bipolar disease.   HISTORY OF PRESENT ILLNESS:  A 50 year old female with a history of  right hip and groin pain secondary to osteoarthritis and avascular  necrosis.  Refractory to all conservative treatment.   CONSULTANTS:  Pharmacy Coumadin.   PROCEDURE:  Was right total hip replacement by surgeon Dr. Durene Romans.  Assistant [employer] PA-C.   LABS UPON ADMISSION:  CBC:  Hemoglobin 14.6, hematocrit 43.1, platelets  102.  At time of discharge her hematocrit was 28.7, platelets 87.  White  cell differential normal.  Her coagulations after starting Coumadin, her  PT was 17, INR was 1.3 prior to discharge.  Routine chemistry all within  normal limits on admission.  At the time of discharge, sodium 139,  potassium 4.2, glucose 129, and creatinine 0.68.  Her kidney function,  GFR greater than 60, calcium was 8.8 at discharge.  Her GI function, AST  60, ALT was 65.  UA was negative.   Cardiology:  EKG showed normal sinus rhythm.  No chest x-ray seen on  chart radiology-wise.  Pelvis 2-view showed right total hip arthroplasty  anatomically aligned.   HOSPITAL COURSE:  The patient underwent right total hip replacementand  admitted to the orthopedic floor.  She remained hemodynamically stable  throughout her course of stay.  Her dressing was changed on a daily  basis.  No significant drainage from the wound.  Hemovac was  discontinued on day 1.  She remained neurovascular intact to right lower  extremity.  She  was weightbearing as tolerated through physical therapy,  made adequate progress and met all functional criteria after 2 days.  Coumadin was started on day 1 for DVT prophylaxis.  Seen on day 2 she  was stable, wound was dry, making good progress with physical therapy.   DISCHARGE DISPOSITION:  Discharged home with in stable and improved  condition with home healthcare physical therapy.   DISCHARGE PHYSICAL THERAPY:  Weightbearing as tolerated.   DISCHARGE DIET:  Regular.   DISCHARGE WOUND CARE:  Keep dry.   DISCHARGE MEDICATIONS:  1. Coumadin 7.5 mg tab 1 daily, INR between 2-3.  2. Robaxin 500 mg p.o. every 6 muscle spasm pain.  3. Iron 325 mg p.o. daily.  4. Colace 100 mg p.o. b.i.d.  5. MiraLax 17 g p.o. daily.  6. Norco 7.5/325 one to two p.o. every 4-6 p.r.n. pain.  7. Seroquel 400 mg p.o. nightly.   DISCHARGE FOLLOWUP:  With Dr. Charlann Boxer at phone number [phone] in 2 weeks  for wound check.     ______________________________  Yetta Glassman. [address], [place]  Madlyn Frankel Charlann Boxer, M.D.  Electronically Signed    BLM/MEDQ  D:  02/14/2008  T:  02/15/2008  Job:  [id number]   cc:   Dineen Kid. Reche Dixon, M.D.  Fax: [phone]

## 2010-10-16 ENCOUNTER — Encounter: Payer: Medicaid Other | Admitting: Physical Therapy

## 2010-10-18 ENCOUNTER — Encounter: Payer: Medicaid Other | Admitting: Physical Therapy

## 2010-10-20 ENCOUNTER — Emergency Department (HOSPITAL_COMMUNITY): Payer: Medicare Other

## 2010-10-20 ENCOUNTER — Emergency Department (HOSPITAL_COMMUNITY)
Admission: EM | Admit: 2010-10-20 | Discharge: 2010-10-20 | Disposition: A | Discharge status: Home / Self Care | Payer: Medicare Other | Attending: Emergency Medicine | Admitting: Emergency Medicine

## 2010-10-20 DIAGNOSIS — R110 Nausea: Secondary | ICD-10-CM | POA: Insufficient documentation

## 2010-10-20 DIAGNOSIS — R000 Tachycardia, unspecified: Secondary | ICD-10-CM | POA: Insufficient documentation

## 2010-10-20 DIAGNOSIS — Z79899 Other long term (current) drug therapy: Secondary | ICD-10-CM | POA: Insufficient documentation

## 2010-10-20 DIAGNOSIS — F988 Other specified behavioral and emotional disorders with onset usually occurring in childhood and adolescence: Secondary | ICD-10-CM | POA: Insufficient documentation

## 2010-10-20 DIAGNOSIS — F319 Bipolar disorder, unspecified: Secondary | ICD-10-CM | POA: Insufficient documentation

## 2010-10-20 DIAGNOSIS — R51 Headache: Secondary | ICD-10-CM | POA: Insufficient documentation

## 2010-10-20 LAB — DIFFERENTIAL
Eosinophils Relative: 1 % (ref 0–5)
Lymphocytes Relative: 38 % (ref 12–46)
Lymphs Abs: 2 10*3/uL (ref 0.7–4.0)
Monocytes Relative: 12 % (ref 3–12)

## 2010-10-20 LAB — BASIC METABOLIC PANEL
CO2: 25 mEq/L (ref 19–32)
Calcium: 9.2 mg/dL (ref 8.4–10.5)
Creatinine, Ser: 0.55 mg/dL (ref 0.4–1.2)
GFR calc Af Amer: >60 mL/min (ref >60)
GFR calc non Af Amer: >60 mL/min (ref >60)
Sodium: 139 mEq/L (ref 135–145)

## 2010-10-20 LAB — CBC
MCH: 36.5 pg — ABNORMAL HIGH (ref 26.0–34.0)
MCHC: 34.7 g/dL (ref 30.0–36.0)
RDW: 14.9 % (ref 11.5–15.5)

## 2010-10-30 ENCOUNTER — Other Ambulatory Visit (HOSPITAL_COMMUNITY): Payer: Self-pay | Admitting: Orthopedic Surgery

## 2010-10-30 DIAGNOSIS — M75100 Unspecified rotator cuff tear or rupture of unspecified shoulder, not specified as traumatic: Secondary | ICD-10-CM

## 2010-10-30 DIAGNOSIS — R52 Pain, unspecified: Secondary | ICD-10-CM

## 2010-11-13 ENCOUNTER — Other Ambulatory Visit: Payer: Self-pay | Admitting: Oncology

## 2010-11-13 ENCOUNTER — Encounter (HOSPITAL_BASED_OUTPATIENT_CLINIC_OR_DEPARTMENT_OTHER): Payer: Medicare Other | Admitting: Oncology

## 2010-11-13 DIAGNOSIS — D696 Thrombocytopenia, unspecified: Secondary | ICD-10-CM

## 2010-11-13 LAB — CBC WITH DIFFERENTIAL/PLATELET
Basophils Absolute: 0 10*3/uL (ref 0.0–0.1)
EOS%: 0.7 % (ref 0.0–7.0)
Eosinophils Absolute: 0 10*3/uL (ref 0.0–0.5)
HGB: 13.8 g/dL (ref 11.6–15.9)
MCH: 36.8 pg — ABNORMAL HIGH (ref 25.1–34.0)
MCV: 106.9 fL — ABNORMAL HIGH (ref 79.5–101.0)
MONO%: 11 % (ref 0.0–14.0)
NEUT#: 2.4 10*3/uL (ref 1.5–6.5)
RBC: 3.75 10*6/uL (ref 3.70–5.45)
RDW: 14.8 % — ABNORMAL HIGH (ref 11.2–14.5)
lymph#: 1.4 10*3/uL (ref 0.9–3.3)
nRBC: 0 % (ref 0–0)

## 2010-11-13 LAB — MORPHOLOGY: RBC Comments: NORMAL

## 2010-11-13 LAB — CHCC SMEAR

## 2010-11-22 ENCOUNTER — Ambulatory Visit (HOSPITAL_COMMUNITY)
Admission: RE | Admit: 2010-11-22 | Discharge: 2010-11-22 | Disposition: A | Discharge status: Home / Self Care | Payer: Medicare Other | Source: Ambulatory Visit | Attending: Orthopedic Surgery | Admitting: Orthopedic Surgery

## 2010-11-22 DIAGNOSIS — M75100 Unspecified rotator cuff tear or rupture of unspecified shoulder, not specified as traumatic: Secondary | ICD-10-CM

## 2010-11-22 DIAGNOSIS — M25519 Pain in unspecified shoulder: Secondary | ICD-10-CM | POA: Insufficient documentation

## 2010-11-22 DIAGNOSIS — R52 Pain, unspecified: Secondary | ICD-10-CM

## 2010-11-28 ENCOUNTER — Emergency Department (HOSPITAL_COMMUNITY)
Admission: EM | Admit: 2010-11-28 | Discharge: 2010-11-28 | Disposition: A | Discharge status: Home / Self Care | Payer: Medicare Other | Attending: Emergency Medicine | Admitting: Emergency Medicine

## 2010-11-28 ENCOUNTER — Emergency Department (HOSPITAL_COMMUNITY): Payer: Medicare Other

## 2010-11-28 DIAGNOSIS — M79609 Pain in unspecified limb: Secondary | ICD-10-CM | POA: Insufficient documentation

## 2010-11-28 DIAGNOSIS — S92309A Fracture of unspecified metatarsal bone(s), unspecified foot, initial encounter for closed fracture: Secondary | ICD-10-CM | POA: Insufficient documentation

## 2010-11-28 DIAGNOSIS — F319 Bipolar disorder, unspecified: Secondary | ICD-10-CM | POA: Insufficient documentation

## 2010-11-28 DIAGNOSIS — Z79899 Other long term (current) drug therapy: Secondary | ICD-10-CM | POA: Insufficient documentation

## 2010-11-28 DIAGNOSIS — Y92009 Unspecified place in unspecified non-institutional (private) residence as the place of occurrence of the external cause: Secondary | ICD-10-CM | POA: Insufficient documentation

## 2010-11-28 DIAGNOSIS — W010XXA Fall on same level from slipping, tripping and stumbling without subsequent striking against object, initial encounter: Secondary | ICD-10-CM | POA: Insufficient documentation

## 2010-11-28 DIAGNOSIS — F988 Other specified behavioral and emotional disorders with onset usually occurring in childhood and adolescence: Secondary | ICD-10-CM | POA: Insufficient documentation

## 2010-12-15 ENCOUNTER — Emergency Department (HOSPITAL_COMMUNITY): Payer: Medicare Other

## 2010-12-15 ENCOUNTER — Inpatient Hospital Stay (HOSPITAL_COMMUNITY)
Admission: EM | Admit: 2010-12-15 | Discharge: 2010-12-20 | DRG: 871 | Disposition: A | Discharge status: Home / Self Care | Payer: Medicare Other | Attending: Internal Medicine | Admitting: Internal Medicine

## 2010-12-15 DIAGNOSIS — D6959 Other secondary thrombocytopenia: Secondary | ICD-10-CM | POA: Diagnosis present

## 2010-12-15 DIAGNOSIS — E876 Hypokalemia: Secondary | ICD-10-CM | POA: Diagnosis not present

## 2010-12-15 DIAGNOSIS — F319 Bipolar disorder, unspecified: Secondary | ICD-10-CM | POA: Diagnosis present

## 2010-12-15 DIAGNOSIS — K2980 Duodenitis without bleeding: Secondary | ICD-10-CM | POA: Diagnosis present

## 2010-12-15 DIAGNOSIS — K921 Melena: Secondary | ICD-10-CM | POA: Diagnosis present

## 2010-12-15 DIAGNOSIS — Z79899 Other long term (current) drug therapy: Secondary | ICD-10-CM

## 2010-12-15 DIAGNOSIS — G43909 Migraine, unspecified, not intractable, without status migrainosus: Secondary | ICD-10-CM | POA: Diagnosis present

## 2010-12-15 DIAGNOSIS — I10 Essential (primary) hypertension: Secondary | ICD-10-CM | POA: Diagnosis present

## 2010-12-15 DIAGNOSIS — J189 Pneumonia, unspecified organism: Secondary | ICD-10-CM | POA: Diagnosis present

## 2010-12-15 DIAGNOSIS — K449 Diaphragmatic hernia without obstruction or gangrene: Secondary | ICD-10-CM | POA: Diagnosis present

## 2010-12-15 DIAGNOSIS — Z96649 Presence of unspecified artificial hip joint: Secondary | ICD-10-CM

## 2010-12-15 DIAGNOSIS — K219 Gastro-esophageal reflux disease without esophagitis: Secondary | ICD-10-CM | POA: Diagnosis present

## 2010-12-15 DIAGNOSIS — A419 Sepsis, unspecified organism: Principal | ICD-10-CM | POA: Diagnosis present

## 2010-12-15 DIAGNOSIS — K319 Disease of stomach and duodenum, unspecified: Secondary | ICD-10-CM | POA: Diagnosis present

## 2010-12-15 DIAGNOSIS — K2960 Other gastritis without bleeding: Secondary | ICD-10-CM | POA: Diagnosis present

## 2010-12-15 DIAGNOSIS — R0902 Hypoxemia: Secondary | ICD-10-CM | POA: Diagnosis present

## 2010-12-15 DIAGNOSIS — I959 Hypotension, unspecified: Secondary | ICD-10-CM | POA: Diagnosis present

## 2010-12-15 DIAGNOSIS — F17200 Nicotine dependence, unspecified, uncomplicated: Secondary | ICD-10-CM | POA: Diagnosis present

## 2010-12-15 DIAGNOSIS — F909 Attention-deficit hyperactivity disorder, unspecified type: Secondary | ICD-10-CM | POA: Diagnosis present

## 2010-12-15 DIAGNOSIS — K766 Portal hypertension: Secondary | ICD-10-CM | POA: Diagnosis present

## 2010-12-15 DIAGNOSIS — M1990 Unspecified osteoarthritis, unspecified site: Secondary | ICD-10-CM | POA: Diagnosis present

## 2010-12-15 DIAGNOSIS — K5900 Constipation, unspecified: Secondary | ICD-10-CM | POA: Diagnosis present

## 2010-12-15 DIAGNOSIS — K7460 Unspecified cirrhosis of liver: Secondary | ICD-10-CM | POA: Diagnosis present

## 2010-12-15 DIAGNOSIS — G8929 Other chronic pain: Secondary | ICD-10-CM | POA: Diagnosis present

## 2010-12-15 DIAGNOSIS — R109 Unspecified abdominal pain: Secondary | ICD-10-CM | POA: Diagnosis present

## 2010-12-15 DIAGNOSIS — B1920 Unspecified viral hepatitis C without hepatic coma: Secondary | ICD-10-CM | POA: Diagnosis present

## 2010-12-15 LAB — DIFFERENTIAL
Basophils Absolute: 0 10*3/uL (ref 0.0–0.1)
Basophils Relative: 0 % (ref 0–1)
Eosinophils Absolute: 0 10*3/uL (ref 0.0–0.7)
Eosinophils Relative: 0 % (ref 0–5)
Lymphs Abs: 1.5 10*3/uL (ref 0.7–4.0)
Metamyelocytes Relative: 0 %
Monocytes Absolute: 0 10*3/uL — ABNORMAL LOW (ref 0.1–1.0)
Myelocytes: 0 %
Neutro Abs: 2.9 10*3/uL (ref 1.7–7.7)
Promyelocytes Absolute: 0 %

## 2010-12-15 LAB — CBC
MCHC: 34.5 g/dL (ref 30.0–36.0)
Platelets: 51 10*3/uL — ABNORMAL LOW (ref 150–400)
RDW: 14.9 % (ref 11.5–15.5)
WBC: 4.4 10*3/uL (ref 4.0–10.5)

## 2010-12-15 LAB — COMPREHENSIVE METABOLIC PANEL
AST: 64 U/L — ABNORMAL HIGH (ref 0–37)
Albumin: 2.8 g/dL — ABNORMAL LOW (ref 3.5–5.2)
Alkaline Phosphatase: 72 U/L (ref 39–117)
BUN: 7 mg/dL (ref 6–23)
Potassium: 3.5 mEq/L (ref 3.5–5.1)
Sodium: 133 mEq/L — ABNORMAL LOW (ref 135–145)
Total Protein: 6.8 g/dL (ref 6.0–8.3)

## 2010-12-15 LAB — MRSA PCR SCREENING: MRSA by PCR: NEGATIVE

## 2010-12-15 LAB — URINALYSIS, ROUTINE W REFLEX MICROSCOPIC
Glucose, UA: NEGATIVE mg/dL
Hgb urine dipstick: NEGATIVE
Ketones, ur: NEGATIVE mg/dL
Leukocytes, UA: NEGATIVE
pH: 5.5 (ref 5.0–8.0)

## 2010-12-15 MED ORDER — IOHEXOL 300 MG/ML  SOLN
125.0000 mL | Freq: Once | INTRAMUSCULAR | Status: AC | PRN
  Administered 2010-12-15: 125 mL via INTRAVENOUS

## 2010-12-15 NOTE — Op Note (Signed)
[name], SIGNER NO.:  [id number]  MEDICAL RECORD NO.:  [id number]  LOCATION:  WLED                         FACILITY:  [facility]  PHYSICIAN:  Tana Felts, MD     DATE OF BIRTH:  Apr 18, 1961  DATE OF PROCEDURE: DATE OF DISCHARGE:                              OPERATIVE REPORT   TEAM:  [facility] [facility]  PRIMARY CARE PHYSICIAN:  None.  HEPATOLOGIST:  Brooke Dare, M.D. at [facility].  CHIEF COMPLAINT:  Left chest and flank pain.  HISTORY OF PRESENT ILLNESS:  This is a 50 year old woman with past medical history significant for bipolar disorder and hepatitis C, who presents today with left-sided chest pain and vomiting.  She says her symptoms started rather abruptly yesterday, became worse around 2:00 a.m. this morning.  She was vomiting most of the night and then developed some substernal chest pain associated with the vomiting.  She also has severe pain mainly in her left flank and also been in her left chest.  She is coughing a bit and some green phlegm comes up when she coughs.  Her grandson has a cold, but she has no other recent contacts. She does get short of breath and in the Emergency Room was noted to be hypoxemic.  She came to the Emergency Room today for workup of the symptoms, was found to be febrile to 102.  She then became hypotensive with MAP down into the 50s, which improved somewhat with fluid such that her last blood pressure was 100/53.  She does get her baseline pressures of about 135/80 and she feels dizzy when stands up, but otherwise is mentating well.  She was started on oxygen and sats remained around 90. Otherwise, she notes that she broke her foot about 1 week ago.  She has headache when she has dry hives, but otherwise 10-system review of system is negative.  PAST MEDICAL HISTORY: 1. Hepatitis C.  She is supposed to start therapy with interferon and     an another drug in September. 2. Bipolar disorder. 3. ADHD. 4. Migraines. 5.  GERD. 6. Osteoarthritis. 7. Degenerative joint disease. 8. History of cervical radiculopathy. 9. Chronic pain. 10.Chronic tobacco use. 11.Thrombocytopenia. 12.Avascular necrosis of the right hip.  SOCIAL HISTORY:  Patient lives alone, though she is on the care of her grandchildren.  She smokes about a pack per day.  No alcohol in the past.  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  She is on disability.  FAMILY HISTORY:  Noncontributory.  PAST SURGICAL HISTORY: 1. Cervical decompression and fusion. 2. Total right hip arthroplasty. 3. Hysterectomy. 4. Cholecystectomy. 5. Appendectomy.  ALLERGIES:  No true allergies, but ASPIRIN causes her GI distress, ADHESIVE TAPE cause a rash.  MEDICATIONS: 1. Seroquel 300 mg every night. 2. Adderall 20 mg daily. 3. Hydrocodone. 4. Acetaminophen 10 mg as needed for her foot.  PHYSICAL EXAMINATION:  VITAL SIGNS:  Temperature on arrival was 102.3, most recently 98 6; pulse 104; blood pressure  100/53; respiratory rate 16; satting about 90% on supplemental oxygen. GENERAL:  This is a well-nourished, well-developed woman, who appears acutely ill.  There was not any distress.  HEENT:  Pupils are equal.  Extraocular movements are intact.  She has no scleral icterus. NECK:  Supple.  She has moist mucous membranes.  No thrush. LUNGS:  Right lung is clear.  Left lung, there was bronchial sounds on the lower two-thirds as well as egophony in that area. CARDIAC:  Regular rate and rhythm.  No murmurs, rubs or gallops. ABDOMEN:  Soft, is tender along the left lateral side, nondistended. Normal bowel sounds, perhaps slightly decreased. EXTREMITIES:  Warm and well-perfused.  No cyanosis, clubbing or edema. She has a bandage on her right foot, which I did not examine closely, now what is the cause of her pain.  She had good pulses in the left foot.  Has a tattoo around that ankle as well as an IV in that place.  LABORATORY DATA:   White count 4.4, hemoglobin 11.8, platelet 51,000. Creatinine 0.73.  Sodium slightly low at 133, glucose 114.  AST 64, ALT 34, alk phos 72.  Total bill 0.7, total protein 6.8, albumin 2.8. Urinalysis was unremarkable.  DIAGNOSTIC STUDIES:  CT scan of the chest revealed a confluent posterior left lower lobe infiltrate consistent with pneumonia, moderate hiatal hernia, cirrhosis and splenomegaly.  IMPRESSION:  This is a 50 year old woman with hepatitis C as well as several other medical problems, who presents today with left chest pain due to lobar pneumonia.  I suspect this is community-acquired.  She has not been in the hospital recently, but she is somewhat immunocompromised due to her liver disease.  She meets criteria for systemic inflammatory response syndrome and as such I think she is to be admitted to the step- down unit at least until her blood pressure and tachycardia improve.  PLAN: 1. Pneumonia:  Patient has been started on ceftriaxone, azithromycin     in the Emergency Room.  We will continue these considering addition     of vancomycin if she does not improve. We will send off sputum     culture, Legionella and Strep pneumoniae urinary antigens and blood     cultures as well. 2. Hypotension:  I believe this is due to sepsis from her infection.     Continue IV fluids and watch her carefully in the MPCU.  Hopefully,     once antibiotics have been initiated and she is getting hydration,     she will improve quickly. 3. Liver disease:  We will check an INR and PTT. Thrombocytopenia and     imaging suggest cirrhosis, but the patient does not have any     evidence of hepatic decompensation at this time. 4. Bipolar disorder and ADHD:  Patient says she is under control and     we will continue her home medications of Adderall and Seroquel. 5. Pain control:  Since the patient is having trouble keeping things     down, we will give her IV Dilaudid and Tylenol as needed for pain,      which can be transitioned back to oral pain medications, so pain     improves.  We will also give her Zofran for nausea.     Tana Felts, MD     NB/MEDQ  D:  12/15/2010  T:  12/15/2010  Job:  [id number]  Electronically Signed by Tana Felts M.D. on 12/15/2010 11:09:38 PM

## 2010-12-16 LAB — COMPREHENSIVE METABOLIC PANEL
ALT: 24 U/L (ref 0–35)
AST: 45 U/L — ABNORMAL HIGH (ref 0–37)
Calcium: 7.8 mg/dL — ABNORMAL LOW (ref 8.4–10.5)
Sodium: 134 mEq/L — ABNORMAL LOW (ref 135–145)
Total Protein: 6.1 g/dL (ref 6.0–8.3)

## 2010-12-16 LAB — URINE CULTURE
Colony Count: 25000
Culture  Setup Time: 201207221122

## 2010-12-16 LAB — PROTIME-INR: Prothrombin Time: 18.3 seconds — ABNORMAL HIGH (ref 11.6–15.2)

## 2010-12-16 LAB — CBC
MCV: 109.1 fL — ABNORMAL HIGH (ref 78.0–100.0)
Platelets: 45 10*3/uL — ABNORMAL LOW (ref 150–400)
RBC: 2.87 MIL/uL — ABNORMAL LOW (ref 3.87–5.11)
WBC: 7.3 10*3/uL (ref 4.0–10.5)

## 2010-12-16 LAB — APTT: aPTT: 52 seconds — ABNORMAL HIGH (ref 24–37)

## 2010-12-16 LAB — RAPID URINE DRUG SCREEN, HOSP PERFORMED
Amphetamines: NOT DETECTED
Tetrahydrocannabinol: NOT DETECTED

## 2010-12-16 LAB — LEGIONELLA ANTIGEN, URINE

## 2010-12-17 LAB — CBC
MCH: 36.2 pg — ABNORMAL HIGH (ref 26.0–34.0)
Platelets: 57 10*3/uL — ABNORMAL LOW (ref 150–400)
RBC: 2.76 MIL/uL — ABNORMAL LOW (ref 3.87–5.11)
WBC: 5.7 10*3/uL (ref 4.0–10.5)

## 2010-12-17 LAB — BASIC METABOLIC PANEL
CO2: 23 mEq/L (ref 19–32)
Calcium: 8 mg/dL — ABNORMAL LOW (ref 8.4–10.5)
Sodium: 137 mEq/L (ref 135–145)

## 2010-12-18 LAB — BASIC METABOLIC PANEL
Calcium: 8.6 mg/dL (ref 8.4–10.5)
GFR calc Af Amer: >60 mL/min (ref >60)
GFR calc non Af Amer: >60 mL/min (ref >60)
Glucose, Bld: 83 mg/dL (ref 70–99)
Potassium: 3.2 mEq/L — ABNORMAL LOW (ref 3.5–5.1)
Sodium: 139 mEq/L (ref 135–145)

## 2010-12-18 LAB — OCCULT BLOOD X 1 CARD TO LAB, STOOL: Fecal Occult Bld: NEGATIVE

## 2010-12-18 LAB — CBC
MCH: 37 pg — ABNORMAL HIGH (ref 26.0–34.0)
MCHC: 35 g/dL (ref 30.0–36.0)
Platelets: 68 10*3/uL — ABNORMAL LOW (ref 150–400)
RDW: 14.9 % (ref 11.5–15.5)

## 2010-12-18 LAB — HEMOGLOBIN AND HEMATOCRIT, BLOOD
HCT: 31.7 % — ABNORMAL LOW (ref 36.0–46.0)
Hemoglobin: 10.9 g/dL — ABNORMAL LOW (ref 12.0–15.0)

## 2010-12-18 NOTE — Consult Note (Signed)
  [name], [name] NO.:  [id number]  MEDICAL RECORD NO.:  [id number]  LOCATION:  1339                         FACILITY:  [facility]  PHYSICIAN:  Graylin Shiver, M.D.   DATE OF BIRTH:  [birth year]  DATE OF CONSULTATION:  12/18/2010 DATE OF DISCHARGE:                                CONSULTATION   REASON FOR CONSULTATION:  The patient is a 50 year old female with a history of hepatitis C, who has been under the care of Dr. Brooke Dare at [facility].  She is supposed to start on treatment in a couple of months for her hepatitis C.  We are asked to see this patient in regards to melena which began yesterday.  She tells me that she started to notice black tarry stools yesterday and has noticed some today as well.  On admission to the hospital, her hemoglobin and hematocrit were 11.8 and 34.2, respectively.  Today, her hemoglobin and hematocrit are 10 and 29.9.  She denies vomiting blood.  She has been experiencing generalized abdominal discomfort.  She has been having heartburn as well.  She states that years ago she had an endoscopy and had multiple ulcers.  PAST HISTORY:  Hepatitis C, bipolar disorder, ADHD, migraine headaches, GERD, osteoarthritis, degenerative joint disease, cervical radiculopathy, history of chronic pain, chronic tobacco use, thrombocytopenia, avascular necrosis of right hip.  SOCIAL HISTORY:  She smokes, does not drink alcohol, used to use cocaine.  PAST SURGICAL HISTORY:  Cervical decompression and fusion, total right hip arthroplasty, hysterectomy, cholecystectomy, appendectomy.  ALLERGIES:  ASPIRIN causes GI ulcer.  ADHESIVE TAPE causes rash.  MEDICATIONS PRIOR TO ADMISSION:  Seroquel, Adderall, hydrocodone, acetaminophen.  REVIEW OF SYSTEMS:  No chest pain.  She has been somewhat short of breath and having some cough.  PHYSICAL EXAMINATION:  GENERAL:  She does not appear in any acute distress. VITAL SIGNS:  Stable. HEENT:   Nonicteric. HEART:  Regular rhythm.  No murmurs. LUNGS:  Decreased breath sounds. ABDOMEN:  Bowel sounds are present.  It is soft.  There is some mild diffuse vague tenderness.  LABORATORY DATA:  CT scan shows a posterior left lobe infiltrate consistent with pneumonia and moderate hiatal hernia, cirrhosis of the liver and splenomegaly.  Most recent platelet count is 68,000 and prothrombin time on July 23 was 18.3 with an INR of 1.49.  IMPRESSION: 1. Cirrhosis of the liver. 2. Hepatitis C. 3. Melena. 4. Pneumonia.  PLAN:  To evaluate her melena at this time.  We will proceed with endoscopy.  I will hold her subcu heparin.  IV Protonix was started today.          ______________________________ Graylin Shiver, M.D.     SFG/MEDQ  D:  12/18/2010  T:  12/18/2010  Job:  [id number]  Electronically Signed by Herbert Moors MD on 12/18/2010 03:44:57 PM

## 2010-12-19 LAB — COMPREHENSIVE METABOLIC PANEL
Alkaline Phosphatase: 72 U/L (ref 39–117)
BUN: 4 mg/dL — ABNORMAL LOW (ref 6–23)
Chloride: 100 mEq/L (ref 96–112)
Creatinine, Ser: <0.47 mg/dL — ABNORMAL LOW (ref 0.50–1.10)
Glucose, Bld: 90 mg/dL (ref 70–99)
Potassium: 3.7 mEq/L (ref 3.5–5.1)
Total Bilirubin: 0.8 mg/dL (ref 0.3–1.2)
Total Protein: 7 g/dL (ref 6.0–8.3)

## 2010-12-19 LAB — MAGNESIUM: Magnesium: 1.6 mg/dL (ref 1.5–2.5)

## 2010-12-19 LAB — CBC
HCT: 32.6 % — ABNORMAL LOW (ref 36.0–46.0)
Hemoglobin: 10.9 g/dL — ABNORMAL LOW (ref 12.0–15.0)
MCHC: 33.4 g/dL (ref 30.0–36.0)
MCV: 107.2 fL — ABNORMAL HIGH (ref 78.0–100.0)

## 2010-12-22 LAB — CULTURE, BLOOD (ROUTINE X 2)
Culture  Setup Time: 201207230100
Culture: NO GROWTH

## 2010-12-22 LAB — CULTURE, RESPIRATORY W GRAM STAIN

## 2010-12-22 NOTE — Discharge Summary (Signed)
NAMELAMIAH, [name] NO.:  [id number]  MEDICAL RECORD NO.:  [id number]  LOCATION:  1339                         FACILITY:  [facility]  PHYSICIAN:  Baltazar Najjar, MD     DATE OF BIRTH:  [birth year]  DATE OF ADMISSION:  12/15/2010 DATE OF DISCHARGE:                              DISCHARGE SUMMARY   FINAL DISCHARGE DIAGNOSES: 1. Community-acquired pneumonia. 2. Seizures, resolved. 3. Nonspecific abdominal pain with negative workup. 4. Liver cirrhosis. 5. Melena with negative esophagogastroduodenoscopy. 6. History of hepatitis C. 7. 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 8. Constipation. 9. Gastropathy/gastritis possibly secondary to portal hypertension. 10.History of bipolar disorder. 11.History of attention-deficit hyperactivity. 12.Hypertension.  CONSULTATIONS:  Gastroenterology.  The patient was seen by Dr. Evette Cristal  RADIOLOGY/IMAGING:  CT abdomen and pelvis showed confluent posterior left lower lobe infiltrate consistent with pneumonia, moderate hiatal hernia, cirrhosis and splenomegaly consistent with portal venous hypertension.  No evidence of ascites or any other acute finding within the abdomen or pelvis.  PROCEDURES:  EGD done on December 15, 2010, showed moderate hiatal hernia. Diffuse erythematous gastropathy.  Erosive antral gastritis.  Mild duodenitis.  No evidence of active bleeding.  BRIEF ADMITTING HISTORY:  Please refer to H and P for more details.  In summary, Ms. [name] is a 50 year old Caucasian woman with history of liver cirrhosis/hepatitis C, followed by Dr. Brooke Dare at [facility], presented to the ER with chief complaint of left chest and left flank pain.  She was also complaining of cough productive of greenish sputum.  HOSPITAL COURSE:  Workup in the ED revealed evidence of pneumonia.  The patient was also hypotensive in the ER.  She was initially admitted to the  ICU and resuscitated with IV fluid.  Her blood pressure  quickly improved and she was covered with antibiotics including Rocephin and Zithromax.  The patient was stabilized and transferred out of the ICU. Her antibiotic was changed to Levaquin.  The patient's symptoms have improved, however, she continued to complain of abdominal pain.  CT scan of the abdomen and pelvis was done on admission that did not show any acute events.  The patient was treated for constipation with a slight improvement in her abdominal pain and she started to complain of black stools, so GI Service was consulted.  The patient underwent EGD, showed the above findings.  She was getting Protonix and there was no plan to do a colonoscopy.  There was no rectal bleeding noted and no active bleeding.  The patient was requesting narcotics for pain, however, I talked with her about the contraindication of Tylenol containing narcotics given her liver cirrhosis.  The patient is followed by [facility] as an outpatient.  We will discharge her on tramadol and for her to follow with [facility] for any further adjustment of her pain regimen.  The patient's pain has currently improved, however, not completely subsided.  She will follow with her PCP on [facility] and with her pain clinic management specialist as well as hepatologist on discharge. 1. Liver cirrhosis/hep C/thrombocytopenia secondary to liver     cirrhosis.  The patient to follow up with her hematologist as  scheduled. 2. Bipolar disorder/ADH.  The patient to continue her medication and     follow with her psychiatrist 3. Chronic pain syndrome.  The patient to continue with tramadol,     avoid any Tylenol containing products and follow with pain     management specialist. 4. Hypertension.  The patient does not have a history of hypertension,     however, her blood pressure was constantly elevated during this     hospitalization.  I started her on amlodipine 2.5 mg daily.  The     patient to  continue and follow with her PCP for further adjustment     of her antihypertensive regimen.  Her blood pressure elevation     could be exacerbated by pain as well.  DISCHARGE MEDICATIONS: 1. Amlodipine 2.5 mg p.o. daily. 2. Robitussin syrup 10 mL p.o. q.4 h p.r.n. 3. Levaquin 750 mg p.o. daily for 4 more days to complete 10 days of     antibiotics. 4. MiraLax 17 g p.o. daily as needed. 5. Tramadol 50 mg 1 tablet p.o. q.8 h p.r.n. for 7 more days p.r.n. 6. Protonix 40 mg p.o. daily. 7. Senokot-S 2 tablets p.o. daily as needed. 8. Adderall 20 mg 1 tablet p.o. twice daily. 9. Seroquel 300 mg 1 tablet p.o. daily at bedtime.  DISCHARGE INSTRUCTIONS: 1. The patient to continue the above medications as prescribed. 2. The patient to follow with her hepatologist, Dr. Brooke Dare, at     [facility] as scheduled. 3. The patient to follow with [facility] as scheduled. 4. The patient to follow with [facility] ASAP.  She does have an     appointment in August. 5. The patient to avoid any Tylenol containing narcotics given her     liver disease. 6. The patient to report any worsening of symptoms or any new symptoms     to her PCP or come to the ER if needed.  CONDITION ON DISCHARGE:  Improved/stable.          ______________________________ Baltazar Najjar, MD     SA/MEDQ  D:  12/20/2010  T:  12/20/2010  Job:  [id number]  cc:   Brooke Dare, MD Fax: [phone]  Clinic [facility] Fax: [phone]  Electronically Signed by Hannah Beat MD on 12/22/2010 01:19:45 PM

## 2011-01-02 NOTE — Discharge Summary (Signed)
  NAMEROSIELEE, CORPORAN NO.:  [id number]  MEDICAL RECORD NO.:  [id number]  LOCATION:  1339                         FACILITY:  [facility]  PHYSICIAN:  Baltazar Najjar, MD     DATE OF BIRTH:  Dec 27, 1960  DATE OF ADMISSION:  12/15/2010 DATE OF DISCHARGE:  12/20/2010                              DISCHARGE SUMMARY   ADDENDUM/CORRECTION: For final discharge diagnoses, correction on problem #2, which was an typed/transcribed in error as seizures, resolved.  However, the correct diagnosis was systemic inflammatory response syndrome(SIRS), resolved.Patient has no seizures during this hospitalization and no history of seizure reported .          ______________________________ Baltazar Najjar, MD     SA/MEDQ  D:  12/31/2010  T:  12/31/2010  Job:  [id number]  Electronically Signed by Hannah Beat MD on 01/02/2011 04:11:39 PM

## 2011-01-10 ENCOUNTER — Other Ambulatory Visit: Payer: Self-pay | Admitting: Oncology

## 2011-01-10 ENCOUNTER — Encounter (HOSPITAL_BASED_OUTPATIENT_CLINIC_OR_DEPARTMENT_OTHER): Payer: Medicare Other | Admitting: Oncology

## 2011-01-10 DIAGNOSIS — K7460 Unspecified cirrhosis of liver: Secondary | ICD-10-CM

## 2011-01-10 DIAGNOSIS — D72819 Decreased white blood cell count, unspecified: Secondary | ICD-10-CM

## 2011-01-10 DIAGNOSIS — D696 Thrombocytopenia, unspecified: Secondary | ICD-10-CM

## 2011-01-10 DIAGNOSIS — B1920 Unspecified viral hepatitis C without hepatic coma: Secondary | ICD-10-CM

## 2011-01-10 LAB — CBC WITH DIFFERENTIAL/PLATELET
Basophils Absolute: 0 10*3/uL (ref 0.0–0.1)
Eosinophils Absolute: 0 10*3/uL (ref 0.0–0.5)
HCT: 33.2 % — ABNORMAL LOW (ref 34.8–46.6)
HGB: 11.4 g/dL — ABNORMAL LOW (ref 11.6–15.9)
MONO#: 0.4 10*3/uL (ref 0.1–0.9)
NEUT%: 44 % (ref 38.4–76.8)
WBC: 3.3 10*3/uL — ABNORMAL LOW (ref 3.9–10.3)
lymph#: 1.4 10*3/uL (ref 0.9–3.3)

## 2011-01-10 LAB — COMPREHENSIVE METABOLIC PANEL
ALT: 29 U/L (ref 0–35)
BUN: 5 mg/dL — ABNORMAL LOW (ref 6–23)
CO2: 23 mEq/L (ref 19–32)
Calcium: 8.8 mg/dL (ref 8.4–10.5)
Chloride: 107 mEq/L (ref 96–112)
Creatinine, Ser: 0.58 mg/dL (ref 0.50–1.10)

## 2011-02-05 NOTE — Op Note (Signed)
  NAMECALIOPE, RUPPERT NO.:  [id number]  MEDICAL RECORD NO.:  [id number]  LOCATION:  1339                         FACILITY:  [facility]  PHYSICIAN:  Graylin Shiver, M.D.   DATE OF BIRTH:  11-09-1960  DATE OF PROCEDURE:  12/18/2010 DATE OF DISCHARGE:                              OPERATIVE REPORT   PROCEDURE PERFORMED:  Esophagogastroduodenoscopy.  INDICATIONS FOR PROCEDURE:  Melena.  CONSENT:  Informed consent was obtained after explanation of the risks of bleeding, infection, and perforation.  PREMEDICATIONS:  Fentanyl 50 mcg IV, Versed 5 mg IV, Benadryl 50 mg IV.  PROCEDURE IN DETAIL:  With the patient in the left lateral decubitus position, the Pentax gastroscope was inserted into the oropharynx and passed into the esophagus.  It was advanced down the esophagus then into the stomach and into the duodenum.  The second portion of the duodenum looked normal.  The bulb of the duodenum showed some erythema, compatible with a mild duodenitis.  The stomach showed antral erosive gastritis but no evidence of active bleeding.  There was diffuse erythematous gastropathy noted with scattered punctate reddish spots, and this is probably a component of portal hypertensive gastropathy. There was a moderate-sized hiatal hernia.  The esophagus looked normal with no evidence of esophageal varices.  She tolerated the procedure well without complications.  IMPRESSION: 1. Moderate hiatal hernia. 2. Diffuse erythematous gastropathy. 3. Erosive antral gastritis. 4. Mild duodenitis. 5. No evidence of active bleeding.          ______________________________ Graylin Shiver, M.D.     SFG/MEDQ  D:  12/19/2010  T:  12/19/2010  Job:  [id number]  Electronically Signed by Herbert Moors MD on 02/05/2011 08:48:47 AM

## 2011-02-20 LAB — POCT I-STAT, CHEM 8
Calcium, Ion: 1.18
Glucose, Bld: 125 — ABNORMAL HIGH
HCT: 41
Hemoglobin: 13.9
Potassium: 3.1 — ABNORMAL LOW
TCO2: 21

## 2011-02-20 LAB — CBC
HCT: 40.2
Hemoglobin: 13.7
Platelets: 103 — ABNORMAL LOW
WBC: 5.4

## 2011-02-20 LAB — POCT CARDIAC MARKERS
CKMB, poc: <1 — ABNORMAL LOW
CKMB, poc: <1 — ABNORMAL LOW
Myoglobin, poc: 38.8

## 2011-02-21 LAB — BASIC METABOLIC PANEL
CO2: 26
Chloride: 108
Glucose, Bld: 86
Potassium: 3.8
Sodium: 142

## 2011-02-21 LAB — DIFFERENTIAL
Basophils Absolute: 0
Basophils Relative: 0
Eosinophils Relative: 2
Monocytes Absolute: 0.5
Monocytes Relative: 10

## 2011-02-21 LAB — APTT: aPTT: 31

## 2011-02-21 LAB — CBC
HCT: 43.1
Hemoglobin: 14.6
MCHC: 33.9
RDW: 12.5

## 2011-02-21 LAB — PROTIME-INR: INR: 1.1

## 2011-02-21 LAB — URINALYSIS, ROUTINE W REFLEX MICROSCOPIC
Bilirubin Urine: NEGATIVE
Glucose, UA: NEGATIVE
Hgb urine dipstick: NEGATIVE
Specific Gravity, Urine: 1.014
pH: 5.5

## 2011-02-21 LAB — HEPATIC FUNCTION PANEL
ALT: 65 — ABNORMAL HIGH
Bilirubin, Direct: 0.1
Indirect Bilirubin: 0.5
Total Bilirubin: 0.6

## 2011-03-04 LAB — DIFFERENTIAL
Basophils Absolute: 0
Basophils Relative: 1
Eosinophils Absolute: 0.1 — ABNORMAL LOW
Eosinophils Relative: 2
Lymphocytes Relative: 39
Lymphs Abs: 2
Monocytes Absolute: 0.5
Monocytes Relative: 10
Neutro Abs: 2.4
Neutrophils Relative %: 48

## 2011-03-04 LAB — COMPREHENSIVE METABOLIC PANEL
AST: 55 — ABNORMAL HIGH
Albumin: 3.2 — ABNORMAL LOW
CO2: 26
Calcium: 9.3
Creatinine, Ser: 0.76
GFR calc Af Amer: >60
GFR calc non Af Amer: >60
Total Protein: 7

## 2011-03-04 LAB — URINALYSIS, ROUTINE W REFLEX MICROSCOPIC
Bilirubin Urine: NEGATIVE
Glucose, UA: NEGATIVE
Hgb urine dipstick: NEGATIVE
Ketones, ur: NEGATIVE
Nitrite: NEGATIVE
Protein, ur: NEGATIVE
Specific Gravity, Urine: 1.028
Urobilinogen, UA: 0.2
pH: 6

## 2011-03-04 LAB — CBC
HCT: 36
Hemoglobin: 12.4
MCHC: 34.5
MCV: 98.2
Platelets: 120 — ABNORMAL LOW
RBC: 3.66 — ABNORMAL LOW
RDW: 13
WBC: 5

## 2011-03-04 LAB — LIPASE, BLOOD: Lipase: 29

## 2011-03-04 LAB — POCT CARDIAC MARKERS
CKMB, poc: <1 — ABNORMAL LOW
Troponin i, poc: <0.05

## 2011-03-13 ENCOUNTER — Other Ambulatory Visit: Payer: Self-pay | Admitting: Oncology

## 2011-03-13 ENCOUNTER — Encounter (HOSPITAL_BASED_OUTPATIENT_CLINIC_OR_DEPARTMENT_OTHER): Payer: Medicare Other | Admitting: Oncology

## 2011-03-13 DIAGNOSIS — D696 Thrombocytopenia, unspecified: Secondary | ICD-10-CM

## 2011-03-13 DIAGNOSIS — K7460 Unspecified cirrhosis of liver: Secondary | ICD-10-CM

## 2011-03-13 DIAGNOSIS — D72819 Decreased white blood cell count, unspecified: Secondary | ICD-10-CM

## 2011-03-13 DIAGNOSIS — B1920 Unspecified viral hepatitis C without hepatic coma: Secondary | ICD-10-CM

## 2011-03-13 LAB — CBC WITH DIFFERENTIAL/PLATELET
BASO%: 0.3 % (ref 0.0–2.0)
Basophils Absolute: 0 10*3/uL (ref 0.0–0.1)
EOS%: 1.4 % (ref 0.0–7.0)
HGB: 12.3 g/dL (ref 11.6–15.9)
MCH: 35.4 pg — ABNORMAL HIGH (ref 25.1–34.0)
MCHC: 33.9 g/dL (ref 31.5–36.0)
RDW: 12.7 % (ref 11.2–14.5)
lymph#: 1.3 10*3/uL (ref 0.9–3.3)

## 2011-05-14 ENCOUNTER — Encounter: Payer: Self-pay | Admitting: *Deleted

## 2011-05-18 ENCOUNTER — Encounter: Payer: Self-pay | Admitting: Oncology

## 2011-05-18 DIAGNOSIS — D72819 Decreased white blood cell count, unspecified: Secondary | ICD-10-CM | POA: Insufficient documentation

## 2011-05-18 DIAGNOSIS — D696 Thrombocytopenia, unspecified: Secondary | ICD-10-CM | POA: Insufficient documentation

## 2011-05-18 DIAGNOSIS — B182 Chronic viral hepatitis C: Secondary | ICD-10-CM | POA: Insufficient documentation

## 2011-05-22 ENCOUNTER — Other Ambulatory Visit: Payer: Self-pay | Admitting: Gastroenterology

## 2011-05-23 ENCOUNTER — Other Ambulatory Visit: Payer: Medicare Other | Admitting: Lab

## 2011-05-23 ENCOUNTER — Ambulatory Visit: Payer: Medicare Other | Admitting: Oncology

## 2011-05-23 ENCOUNTER — Telehealth: Payer: Self-pay | Admitting: Oncology

## 2011-05-23 NOTE — Telephone Encounter (Signed)
lmonvm adviisngh the pt of her r/s appts to [place]. Pt missed her appts for dec

## 2011-05-27 DIAGNOSIS — J189 Pneumonia, unspecified organism: Secondary | ICD-10-CM

## 2011-05-27 HISTORY — DX: Pneumonia, unspecified organism: J18.9

## 2011-06-05 ENCOUNTER — Telehealth: Payer: Self-pay | Admitting: Gastroenterology

## 2011-06-05 ENCOUNTER — Ambulatory Visit (HOSPITAL_COMMUNITY)
Admission: RE | Admit: 2011-06-05 | Discharge: 2011-06-05 | Disposition: A | Discharge status: Home / Self Care | Payer: Medicare Other | Source: Ambulatory Visit | Attending: Oncology | Admitting: Oncology

## 2011-06-05 ENCOUNTER — Ambulatory Visit (HOSPITAL_BASED_OUTPATIENT_CLINIC_OR_DEPARTMENT_OTHER): Payer: Medicare Other | Admitting: Oncology

## 2011-06-05 ENCOUNTER — Other Ambulatory Visit: Payer: Medicare Other | Admitting: Lab

## 2011-06-05 DIAGNOSIS — R05 Cough: Secondary | ICD-10-CM | POA: Insufficient documentation

## 2011-06-05 DIAGNOSIS — D72819 Decreased white blood cell count, unspecified: Secondary | ICD-10-CM

## 2011-06-05 DIAGNOSIS — R071 Chest pain on breathing: Secondary | ICD-10-CM | POA: Insufficient documentation

## 2011-06-05 DIAGNOSIS — B1920 Unspecified viral hepatitis C without hepatic coma: Secondary | ICD-10-CM

## 2011-06-05 DIAGNOSIS — R0602 Shortness of breath: Secondary | ICD-10-CM | POA: Insufficient documentation

## 2011-06-05 DIAGNOSIS — R059 Cough, unspecified: Secondary | ICD-10-CM | POA: Insufficient documentation

## 2011-06-05 DIAGNOSIS — D696 Thrombocytopenia, unspecified: Secondary | ICD-10-CM

## 2011-06-05 DIAGNOSIS — F17200 Nicotine dependence, unspecified, uncomplicated: Secondary | ICD-10-CM | POA: Insufficient documentation

## 2011-06-05 DIAGNOSIS — R0789 Other chest pain: Secondary | ICD-10-CM

## 2011-06-05 LAB — COMPREHENSIVE METABOLIC PANEL
AST: 82 U/L — ABNORMAL HIGH (ref 0–37)
Albumin: 3.7 g/dL (ref 3.5–5.2)
BUN: 8 mg/dL (ref 6–23)
Calcium: 8.5 mg/dL (ref 8.4–10.5)
Chloride: 107 mEq/L (ref 96–112)
Glucose, Bld: 93 mg/dL (ref 70–99)
Potassium: 3.7 mEq/L (ref 3.5–5.3)
Total Protein: 7.5 g/dL (ref 6.0–8.3)

## 2011-06-05 LAB — CBC WITH DIFFERENTIAL/PLATELET
Basophils Absolute: 0 10*3/uL (ref 0.0–0.1)
Eosinophils Absolute: 0.1 10*3/uL (ref 0.0–0.5)
HGB: 11.9 g/dL (ref 11.6–15.9)
NEUT#: 1.5 10*3/uL (ref 1.5–6.5)
RDW: 14 % (ref 11.2–14.5)
WBC: 3.1 10*3/uL — ABNORMAL LOW (ref 3.9–10.3)
lymph#: 1.2 10*3/uL (ref 0.9–3.3)

## 2011-06-05 NOTE — Telephone Encounter (Signed)
I spoke with [name] who has not heard from [facility] since I submitted the prescriptions to them on 05/22/11.  I called and left a voicemail at [facility] and asked that [name] call them and then call me to inform me what she finds out.

## 2011-06-05 NOTE — Progress Notes (Signed)
[facility] OFFICE PROGRESS NOTE  Cc:  Jearld Lesch, MD  DIAGNOSIS:  Hepatitis C-induced pancytopenia.   CURRENT THERAPY: pending start of treatment for HepC.   INTERVAL HISTORY: [name] 51 y.o. female returns for regular follow up. She reports that she has been having sharp left chest wall pain for the last few weeks.  The pain is intermittent, not associated with activity, food.  The pain completely resolves spontaneously after a few minutes without doing anything.  She denies cough, SOB, palpitation, diaphoresis.  With regard to her HepC, she still has not started treatment yet.  She has been under lots of stress of needing to take care of her grandchildren since her daughter is going through a divorce.  She is seeing her therapist twice a week to monitor her Seroquel.  She denies suicidal/homocidal ideation.  She has not been drinking for a while. Beside Methadone, she is not taking any other pain medication.  She still smokes and is not ready to quit at this time.  She could not tolerate nicotine patch or Chantek.  She denies jaundice, fever, mucositis, abdominal pain, abdominal swelling, bleeding sx.    MEDICAL HISTORY: Past Medical History  Diagnosis Date  . Hepatitis C infection   . Chronic pain syndrome   . Cervical radiculopathy   . Osteoarthritis   . Bipolar affective disorder   . GERD (gastroesophageal reflux disease)   . Thrombocytopenia   . Leukocytopenia     SURGICAL HISTORY:  Past Surgical History  Procedure Date  . Tonsilectomy, adenoidectomy, bilateral myringotomy and tubes   . Right knee arthroscopic surgery   . Total hip replacement   . Appendectomy   . Hyperectomy     due to obstructive ovarian cytst    MEDICATIONS: Current Outpatient Prescriptions  Medication Sig Dispense Refill  . amphetamine-dextroamphetamine (ADDERALL) 20 MG tablet Take 20 mg by mouth 2 (two) times daily.        Marland Kitchen METHADONE HCL PO Take 110 mg by mouth daily.        . QUEtiapine Fumarate (SEROQUEL PO) Take 600 mg by mouth at bedtime.          ALLERGIES:  is allergic to aspirin.  REVIEW OF SYSTEMS:  The rest of the 14-point review of system was negative.   Filed Vitals:   06/05/11 1510  BP: 157/83  Pulse: 84  Temp: 98.3 F (36.8 C)   Wt Readings from Last 3 Encounters:  06/05/11 189 lb (85.73 kg)  01/10/11 169 lb 8 oz (76.885 kg)   ECOG Performance status: 1  PHYSICAL EXAMINATION:   General:  well-nourished in no acute distress.  Eyes:  no scleral icterus.  ENT:  There were no oropharyngeal lesions.  Neck was without thyromegaly.  Lymphatics:  Negative cervical, supraclavicular or axillary adenopathy.  Respiratory: lungs were clear bilaterally without wheezing or crackles.  Cardiovascular:  Regular rate and rhythm, S1/S2, without murmur, rub or gallop.  There was no pedal edema.  GI:  abdomen was soft, flat, nontender, nondistended, without organomegaly.  Muscoloskeletal:  no spinal tenderness of palpation of vertebral spine.  Skin exam was without echymosis, petichae.  Neuro exam was nonfocal.  Patient was able to get on and off exam table without assistance.  Gait was normal.  Patient was alerted and oriented.  Attention was good.   Language was appropriate.  Mood was normal without depression.  Speech was not pressured.  Thought content was not tangential.  Breast exam with  Cameo Porter as chaperone did not show abnormal left breast nodule or pain.  There was no left chest wall pain on palpation.        LABORATORY/RADIOLOGY DATA:  Lab Results  Component Value Date   WBC 3.1* 06/05/2011   HGB 11.9 06/05/2011   HCT 34.6* 06/05/2011   PLT 73* 06/05/2011   GLUCOSE 93 06/05/2011   CHOL  Value: 139        ATP III CLASSIFICATION:  <200     mg/dL   Desirable  161-096  mg/dL   Borderline High  >=045    mg/dL   High        40/01/8118   TRIG 302* 03/01/2010   HDL 31* 03/01/2010   LDLCALC  Value: 48        Total Cholesterol/HDL:CHD Risk Coronary Heart  Disease Risk Table                     Men   Women  1/2 Average Risk   3.4   3.3  Average Risk       5.0   4.4  2 X Average Risk   9.6   7.1  3 X Average Risk  23.4   11.0        Use the calculated Patient Ratio above and the CHD Risk Table to determine the patient's CHD Risk.        ATP III CLASSIFICATION (LDL):  <100     mg/dL   Optimal  147-829  mg/dL   Near or Above                    Optimal  130-159  mg/dL   Borderline  562-130  mg/dL   High  >865     mg/dL   Very High 78/08/6960   ALT 42* 06/05/2011   AST 82* 06/05/2011   NA 141 06/05/2011   K 3.7 06/05/2011   CL 107 06/05/2011   CREATININE 0.60 06/05/2011   BUN 8 06/05/2011   CO2 22 06/05/2011   TSH 1.800 03/01/2010   INR 1.49 12/16/2010    Dg Chest 2 View  06/05/2011  *RADIOLOGY REPORT*  Clinical Data: Left chest wall pain, shortness of breath, smoker, cough  CHEST - 2 VIEW  Comparison: March 07, 2010  Findings: The cardiac silhouette, mediastinum, pulmonary vasculature are within normal limits.  Both lungs are clear. There is no acute bony abnormality.  IMPRESSION: There is no evidence of acute cardiac or pulmonary process.  Original Report Authenticated By: Brandon Melnick, M.D.   ASSESSMENT AND PLAN:  1. Depression:  On Seroquel and therapy.  She is trying to optimize her depression control prior to starting therapy for HepC.  2.  HepC:  Again, I urged her to contact Dr. Ferd Hibbs office as soon as possible to start therapy as he deems appropriate.  3.  Cirrhosis:  Compensated.  There is no evidence of encephalopathy, GI bleed, or ascites.  She no longer drinks EtOH.  4.  Pancytopenia:  From cirrhosis, HepC.  Stable without sign of bleeding.  Past BM bx was negative.  Continue observation.  5.  Left chest wall pain:  I referred to PA/lat CXR.  I personally reviewed the images which was negative for infiltrate, edema, effusion, lung mass.  Breast exam was negative.  She said that she is up to date with mammogram which was negative last time.   Her symptoms are not typical of cardiac  origin.  However, I advised her that if her symptoms persist or worsen, she must contact her PCP for further work up as appropriate.    6.  Follow up:  CBC in 4 and 8 months.  I will see her in one year.   The length of time of the face-to-face encounter was 15  minutes. More than 50% of time was spent counseling and coordination of care.

## 2011-06-19 ENCOUNTER — Ambulatory Visit (INDEPENDENT_AMBULATORY_CARE_PROVIDER_SITE_OTHER): Payer: Medicare Other | Admitting: Gastroenterology

## 2011-06-19 DIAGNOSIS — K7460 Unspecified cirrhosis of liver: Secondary | ICD-10-CM

## 2011-06-19 DIAGNOSIS — B182 Chronic viral hepatitis C: Secondary | ICD-10-CM

## 2011-06-19 LAB — HEPATIC FUNCTION PANEL
ALT: 47 U/L — ABNORMAL HIGH (ref 0–35)
Albumin: 3.7 g/dL (ref 3.5–5.2)
Bilirubin, Direct: 0.1 mg/dL (ref 0.0–0.3)
Total Bilirubin: 0.7 mg/dL (ref 0.3–1.2)

## 2011-06-19 NOTE — Progress Notes (Signed)
REFERRING PHYSICIAN: Mia Creek, M.D., [place], [address] [place], [place]  [address] Fax [phone]   PRIMARY CARE PHYSICIAN: Same.   CONSULTING PHYSICIAN: Charna Elizabeth, M.D., [address], [place], [place] [address]   Jethro Bolus, M.D., [facility], [address] [place], [place] [address].   [facility], [address] [place], [place] [address].   Lucile Crater, M.D., [facility], [address], [place], [place] [address].    REASON FOR VISIT: Follow up 1a HCV cirrhosis, commencement of therapy.   HISTORY OF PRESENT ILLNESS: [name] returns unaccompanied and  has no symptoms referrable to her hepatitis C. There are no symptoms of decompensated liver disease or cryoglobulin mediated disease. She has never bled from varices, and was screened for varices on 03/04/11.  She will need another endoscopy in three years.  An ultrasound on 03/04/11, at [facility] showed changes of cirrhosis. She was seen at the [facility] for pancytopenia and her labs are available on Epic.   In terms of treatment of her 1a hepatitis C, she was assessed by Dr Marney Doctor associate, Dr Colvin Caroli, and was thought to be a candidate for treatment if a number of issues that Dr Colvin Caroli mentioned were addressed. Of note she was seen in the pain clinic last month and she attends [facility] every other month.   Having passed through all the assessments, Ms. Castleman is ready to commence treatment of her HCV.   PAST MEDICAL HISTORY:  No significant change.   MEDICATIONS:  Seroquel 300 mg p.o. daily, Adderall 20 mg p.o. b.i.d., methadone 110 mg p.o. daily.   ALLERGIES:  No true allergies.   SMOKING:  1/2 pack per day.   ALCOHOL:  Denies interval consumption.   REVIEW OF SYSTEMS:  All ten systems were reviewed and were negative other than which was mentioned above.  CES-D 22.  PHYSICAL EXAMINATION:  CONSTITUTIONAL: Appears stated age. VITAL SIGNS: Height 67 inches,  weight 187 lbs, blood pressure 151/81, pulse 80, temperature 98.0 F. EAR,  NOSE, MOUTH AND THROAT: Unremarkable oropharynx. No thyromegaly  or neck masses. CHEST: Resonant to percussion. Clear to auscultation.  CARDIOVASCULAR: Heart sounds normal S1, S2, without murmurs or  rubs. There was no peripheral edema. ABDOMEN: Normal bowel sounds.  No masses or tenderness. I could not appreciate liver edge or spleen  tip. There was no ascites or hernias. LYMPHATICS: No cervical or inguinal  lymphadenopathy. CNS: No asterixis or focal neurologic findings. DERMATOLOGIC: Anicteric.  No palmar erythema. EYES: Anicteric sclera. Pupils were equal and reactive to light.   LABORATORY DATA:  Reviewed in EPIC.    ASSESSMENT: Ms Meloy is a 51 year old woman genotype 1a, IL28B CC, cirrhosis by imaging and labs.  There are no absolute contraindications to treating Ms Reinecke according her psychiatric assessment.   In terms of her hepatitis C, with normal synthetic function and other issues being addressed, she will start on therapy on 06/20/11.  She will need 48 weeks because  of her cirrhosis.  In terms of her cirrhosis care, she was screened by EGD on 03/04/11, though she never bled from varices.  A repeat would be needed by 10/15.  She was screened by ultrasound on 03/04/11, and would need a repeat study by 4/13.  There is no encephalopathy or ascites to treat. She is hepatitis A immune from the [place] office but negative at [facility] - will consider her immune. She received  her hepatitis B series at the [place] office in late 2010 to early 2011. She will  need Pneumovax and annual flu shots from her primary MD.   In my discussion today, I discussed treatment of Ms Doxtater 1a HCV. I reviewed our treatment protocol and the success rate. I demonstrated the self injection techniques for the Pegasys.  I discussed dosing of the ribavirin and boceprevir.   I gave her reading material about boceprevir in terms of side effect management and dosing.  We discussed the schedule of lab work.  PLAN:  1. EGD done. 2  Ultrasound to be done in April 2013 in [place], will order at subsequent visit.  3. Hepatitis A immune. Hepatitis B vaccine done in 2010-2011. 4. Will need Pneumovax at her primary if not already done. 5. Baseline labs today. 6. Will return in two weeks for her week 2 on therapy visit.  Her start date will be 06/20/11.  ______________________________   Aris Lot MD, MPH, FRCPC   ADDENDUM Baseline HCV RNA [id number] IU/mL  6.25 log IU/mL

## 2011-06-23 LAB — HEPATITIS C RNA QUANTITATIVE
HCV Quantitative Log: 6.25 {Log} — ABNORMAL HIGH (ref <1.63)
HCV Quantitative: 1770654 IU/mL — ABNORMAL HIGH (ref <43)

## 2011-07-03 ENCOUNTER — Ambulatory Visit (INDEPENDENT_AMBULATORY_CARE_PROVIDER_SITE_OTHER): Payer: Medicare Other | Admitting: Gastroenterology

## 2011-07-03 DIAGNOSIS — B182 Chronic viral hepatitis C: Secondary | ICD-10-CM

## 2011-07-04 LAB — CBC WITH DIFFERENTIAL/PLATELET
Basophils Absolute: 0 10*3/uL (ref 0.0–0.1)
Basophils Relative: 0 % (ref 0–1)
Hemoglobin: 11.9 g/dL — ABNORMAL LOW (ref 12.0–15.0)
MCHC: 32.7 g/dL (ref 30.0–36.0)
Monocytes Relative: 9 % (ref 3–12)
Neutro Abs: 1.6 10*3/uL — ABNORMAL LOW (ref 1.7–7.7)
Neutrophils Relative %: 49 % (ref 43–77)
Platelets: 51 10*3/uL — ABNORMAL LOW (ref 150–400)

## 2011-07-10 ENCOUNTER — Ambulatory Visit: Payer: Medicare Other | Admitting: Gastroenterology

## 2011-07-10 NOTE — Progress Notes (Signed)
[name], [name]  MR#:  [id number]      DATE:  07/03/2011  DOB:  [birth year]    cc: Lucile Crater, MD, [facility], [address], [place], [place] [address]  Manuela Neptune, PMHNP-BC, [facility], [place], [address] [place], [place] [address], Fax [phone]  Consulting Physician:  Naaman Plummer, MD, [address], [place], [place] [address]  Jethro Bolus, MD, [facility], [address] [place], [place], [place] [address]  Primary Care Physician: Same  Referring Physician:  Dineen Kid. Reche Dixon, MD, [address], [address] [place], [place] [address], Fax [phone]    REASON FOR VISIT:  Follow up of genotype 1a hepatitis C cirrhosis, week 2 of therapy.    History:  The patient returns today unaccompanied. Her start date for therapy was 06/20/2011 so that she is now approximately 2 weeks into treatment. Her boceprevir is due to be started on 07/18/2011. She  complains of myalgias in her legs. She is not using anything for this. There is no skin rash. There are no psychiatric complaints. She  reports that she only uses Adderall on a p.r.n. basis. She plans to taper her methadone over the course of next few months.   Past medical history:  No interval change. The patient continues to see Reggie Pile the psychiatric nurse practitioner at [facility] on an ongoing basis.    CURRENT MEDICATIONS:  Adderall 20 mg b.i.d. p.r.n., methadone 10 mg daily, Seroquel 200 mg daily, Pegasys 180 mcg daily, ribavirin 600 mg b.i.d., boceprevir 800 mg q. 8 hours, will start on 07/18/2011.   ALLERGIES:  No true allergies.    Habits:  Smoking, half pack of cigarettes per day. Alcohol denies.   REVIEW OF SYSTEMS:  All 10 systems reviewed today with the patient and they are negative other than which is mentioned above. CES-D was 27.   PHYSICAL EXAMINATION:   Constitutional:  Well-appearing without stigmata of chronic liver  disease. Vital signs: Height 67 inches, weight 188 pounds. Blood pressure 135/75, pulse of 82, temperature 98 Fahrenheit.   ASSESSMENT:  The patient is a 51 year old woman with genotype 1a HCV, IL 28 B CC,  cirrhosis by imaging and labs. She commenced therapy on 06/20/2011 with Pegasys, ribavirin, and boceprevir will be added on 07/18/2011.  She is currently week 2 of treatment. Currently, the patient is tolerating therapy well.  In terms of her cirrhosis care, she was screened by EGD on 03/04/2011, though she has never blood from varices. A repeat would be needed by October 2015. She was screened by ultrasound on 03/04/2011 and a  repeat would be needed by April 2013. There is no encephalopathy or ascites to treat. She is hepatitis A immune from the [place] office but negative at [facility]. I will consider her immune. She received  hepatitis B vaccination through [place] office in late 2010-early 2011. Her primary MD, will need to give her Pneumovax and annual flu shots.  In terms of her hepatitis C care, she is doing well at week 2, with the usual side effects.  In my discussion today, we discussed side effect management. I have explained she can use nonsteroidal antiinflammatory drugs to manage  the myalgias in her legs. We discussed the milestones at which point we will measure her HCV RNA and their significance.   plan:  1. Hepatitis A immune. Hepatitis B vaccination done in 2010/2011. 2. Ultrasound to be done April 2013 in [place]. Will order  at subsequent visit. 3. EGD will not need to be repeated until October 2015. 4. Pneumovax and flu shot through her primary physician if not already done. 5. Standard labs today. 6. Return in 2 weeks' time for week 4 visit at which time, she will need to have her HCV RNA measured.            Brooke Dare, MD   [phone]  D:  Thu Feb 07 17:44:03 2013 ; T:  Thu Feb 07 19:22:09 2013  Job #:  [id number]

## 2011-07-17 ENCOUNTER — Ambulatory Visit (INDEPENDENT_AMBULATORY_CARE_PROVIDER_SITE_OTHER): Payer: Medicare Other | Admitting: Gastroenterology

## 2011-07-17 DIAGNOSIS — B182 Chronic viral hepatitis C: Secondary | ICD-10-CM

## 2011-07-17 LAB — CBC WITH DIFFERENTIAL/PLATELET
Basophils Absolute: 0 10*3/uL (ref 0.0–0.1)
Lymphocytes Relative: 47 % — ABNORMAL HIGH (ref 12–46)
Lymphs Abs: 1.2 10*3/uL (ref 0.7–4.0)
MCV: 102.9 fL — ABNORMAL HIGH (ref 78.0–100.0)
Neutro Abs: 1.2 10*3/uL — ABNORMAL LOW (ref 1.7–7.7)
Neutrophils Relative %: 47 % (ref 43–77)
Platelets: 68 10*3/uL — ABNORMAL LOW (ref 150–400)
RBC: 3.42 MIL/uL — ABNORMAL LOW (ref 3.87–5.11)
RDW: 14.5 % (ref 11.5–15.5)
WBC: 2.5 10*3/uL — ABNORMAL LOW (ref 4.0–10.5)

## 2011-07-17 LAB — HEPATIC FUNCTION PANEL
Albumin: 3.6 g/dL (ref 3.5–5.2)
Bilirubin, Direct: 0.3 mg/dL (ref 0.0–0.3)
Total Bilirubin: 0.9 mg/dL (ref 0.3–1.2)

## 2011-07-21 LAB — HEPATITIS C RNA QUANTITATIVE
HCV Quantitative Log: 5.45 {Log} — ABNORMAL HIGH (ref <1.63)
HCV Quantitative: 284470 IU/mL — ABNORMAL HIGH (ref <43)

## 2011-07-24 NOTE — Progress Notes (Signed)
NAMEALEKHYA, [name]  MR#:  [id number]      DATE:  07/17/2011  DOB:  1960-08-12    cc: Lucile Crater, MD, [facility], [address], [address], [place], [place] [address]  Manuela Neptune, PMHNP-BC, [facility], [place], [address], [place], [place] [address], Fax [phone]   Consulting Physician: Naaman Plummer, MD, [address], [address], [place], [place] [address]  Jethro Bolus, MD, [facility], [address] [place], [place], [place] [address]   Primary Care Physician: Same   Referring Physician: Dineen Kid. Reche Dixon, MD, [address], [address], [place], [place] [address], Fax [phone]     REASON FOR VISIT:  Follow up of genotype 1a hepatitis C, cirrhosis, week 4 of therapy.   History:  The patient returns accompanied by her grandchildren. Her start date for therapy was 06/20/2011, so she is now approximately 4 weeks into treatment. Her boceprevir is to be started tomorrow on 07/16/2011. She  reports today that although I counseled her on the proper dosing of medication for the first week and a half she only took ribavirin 200 mg b.i.d. as opposed to 600 mg b.i.d. She then increased the dose to  the appropriate level. She complains of significant fatigue however, there are no psychiatric complaints nor is there a complaint of rash. She remains on Adderall on a p.r.n. basis.   PAST MEDICAL HISTORY:  No interval change.   CURRENT MEDICATIONS ARE:  Adderall 20 mg b.i.d. p.r.n., methadone 10 mg daily, Seroquel 200 mg daily, Pegasys 180 mcg weekly, ribavirin 600 mg b.i.d., boceprevir 800 mg q. 8 hours.   ALLERGIES:  Denies.   HABITS:  Smokes approximately half pack of cigarettes per day. Alcohol denies interval consumption.   REVIEW OF SYSTEMS:  All 10 systems reviewed today with the patient and they are negative other than which is mentioned above. CES-D was 24.   PHYSICAL EXAMINATION:   Constitutional:  Stated age without  significant peripheral wasting. Vital signs: Height 67 inches, weight 189 pounds up 1 pound from previous.  Blood pressure 136/80, pulse 79, temperature 97.8  Fahrenheit.  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   ASSESSMENT:  The patient is a 51 year old woman with a history of genotype 1a HCV, IL 28 B CC, cirrhosis by imaging labs. Her commencement for therapy was 06/20/2011, of Pegasys and ribavirin, putting her at approximately  week 4 of treatment. Boceprevir will be added tomorrow on 07/16/2011. Will need to draw an HCV RNA at this point for comparison.  Unfortunately, she compromised her dose of ribavirin. There is nothing that can be done to change this at this point. Hopefully, her viral load will be low enough in response to the therapy she received.  In terms of her cirrhosis care, she was screened for varices by EGD on 03/04/2011, and she has never bled from them. Repeat would be needed by October 2015. She was screened by ultrasound on 03/04/2011, and a  repeat ultrasound will be needed by April 2013. There is no encephalopathy or ascites to treat. She was previously hepatitis A immune in the [place] office but negative at [facility]. I previously  considered her immune. She received hepatitis B vaccination through [place] office in early 2010 to early 2011. She will need to receive Pneumovax and flu shot, at her primary MD.  In my discussion today with the patient, I discussed her progress to date. We  discussed the dose adjustments to medications based on side  effects.  She is using polysporin for her stomatitis and I encouraged her to do so.   plan:  1. Hepatitis A immune. Hepatitis B vaccination completed on 07/05/2009. 2. Ultrasound to be done April 1013, which can be ordered in March at a subsequent visit. 3. EGD will not need to be repeated until October 2015. 4. Flu shot and Pneumovax through her primary physician. 5. Standard  labs today on therapy. 6. To return in 2 weeks' time at week 6.            Brooke Dare, MD   ADDENDUM:  Week 4 HCV RNA [id number] IU/mL (5.45)  Baseline [id number] IU/mL (6.25)   [id number]  D:  Thu Feb 21 19:36:50 2013 ; T:  Thu Feb 21 21:33:45 2013  Job #:  [id number]

## 2011-07-25 ENCOUNTER — Other Ambulatory Visit: Payer: Self-pay | Admitting: Oncology

## 2011-07-31 ENCOUNTER — Ambulatory Visit (INDEPENDENT_AMBULATORY_CARE_PROVIDER_SITE_OTHER): Payer: Medicare Other | Admitting: Gastroenterology

## 2011-07-31 DIAGNOSIS — K7460 Unspecified cirrhosis of liver: Secondary | ICD-10-CM

## 2011-07-31 DIAGNOSIS — B182 Chronic viral hepatitis C: Secondary | ICD-10-CM

## 2011-07-31 NOTE — Progress Notes (Signed)
[name], [name]  MR#: [id number]      DATE: 07/31/2011  DOB: 06-Dec-1960    cc:  Mia Crater, MD, [facility], [address], [address], [place], [place] [address]   [name], PMHNP-BC, [facility], [place], [address] [place], [place] [address], Fax [phone]   Consulting Physician: Mia Plummer, MD, [address], [address], [place], [place] [address]   Mia Bolus, MD, [facility], [address] [place], [place], [place] [address]   Primary Care Physician: Same   Referring Physician: Dineen Kid. Reche Dixon, MD, [address], [place], [place] [address], Fax [phone]    REASON FOR VISIT:  Follow up of genotype 1a hepatitis C, cirrhosis, week 6 of therapy.   HISTORY:  The patient returns unaccompanied.  Her start date for therapy was 06/20/2011, so she is now approximately 6 weeks into treatment. Her boceprevir istarted on 07/16/2011.  It should be recalled that that although I  counseled her on the proper dosing of medication for the first week and a half she only took ribavirin 200 mg b.i.d. as opposed to 600 mg b.i.d. She then increased the dose to the appropriate level.  She complains of subjective fevers and myalgias two days after starting the boceprevir, but she has not taken her temperature. She thinks these symptoms are independent of the interferon.  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  She does not have a rash, but has scratches from her dog.  There are no psychiatric complaints. She remains on Adderall on a p.r.n. basis.   PAST MEDICAL HISTORY:  No interval change.   CURRENT MEDICATIONS ARE:  Adderall 20 mg b.i.d. p.r.n., methadone 10 mg daily, Seroquel 200 mg daily, Pegasys 180 mcg weekly, ribavirin 600 mg b.i.d., boceprevir 800 mg q. 8 hours.   ALLERGIES:  Denies.   HABITS:  Smokes approximately half pack of cigarettes per day. Alcohol denies interval consumption.     REVIEW OF SYSTEMS:  All 10 systems reviewed today with the patient and they are negative other than which is mentioned above. CES-D was 24.   PHYSICAL EXAMINATION:  Constitutional: Stated age without significant peripheral wasting. Vital signs: Height 67 inches, weight 191 pounds. Blood pressure 152/80, pulse 86, temperature 97.6 Fahrenheit. The angular stomatitis is better and there is no rash, rather scratches from her dog.  ASSESSMENT:  The patient is a 87 year old woman with a history of genotype 1a HCV, IL 28 B CC, cirrhosis by imaging labs. Her commencement for therapy was 06/20/2011, of Pegasys and ribavirin, putting her at approximately  week 6 of treatment. Boceprevir was added on 07/16/2011.  Week 4 HCV RNA [id number] IU/mL (5.45 log IU/mL), baseline was 8756433 IU/mL (6.25 log IU/mL).  Unfortunately, she compromised her initial dose of ribavirin. There is nothing that can be done to change this at this point, but it may explain the less than 1 log decline in the viral load from baseline.  I doubt the fevers indicate DRESS from the boceprevir because she has no rash.  Will see what her eosinophil count is.  In terms of her cirrhosis care, she was screened for varices by EGD on 03/04/2011, and she has never bled from them. Repeat would be needed by October 2015. She was screened by ultrasound on 03/04/2011, and a repeat ultrasound will be needed by April 2013. There is no encephalopathy or ascites to treat. She was previously hepatitis A immune in the [facility]  office but negative at [facility]. I previously  considered her immune. She received hepatitis B vaccination through [place] office in early 2010 to early 2011. She will need to receive Pneumovax and flu shot, at her primary MD.  In my discussion today with the patient, I discussed her progress to date and her viral kinetics.  I reinforced the need to comply with dosing.  PLAN:  1. Hepatitis A immune. Hepatitis B vaccination completed  on 07/05/2009. 2. Ultrasound to be done April 2013. She wants me to order it at the next visit. 3. EGD will not need to be repeated until October 2015. 4. Flu shot and Pneumovax through her primary physician. 5. CBC today. 6. To return in 2 weeks' time at week 8, at which time another HCV RNA is due.  Brooke Dare, MD   ADDENDUM HgB 9.  Told to reduce the Ribavirin to 400 mg AM 200 mg PM from 600 mg BID on 08/07/11.

## 2011-08-01 LAB — CBC WITH DIFFERENTIAL/PLATELET
Basophils Absolute: 0 10*3/uL (ref 0.0–0.1)
Eosinophils Relative: 0 % (ref 0–5)
HCT: 29.4 % — ABNORMAL LOW (ref 36.0–46.0)
Lymphocytes Relative: 44 % (ref 12–46)
Lymphs Abs: 1 10*3/uL (ref 0.7–4.0)
MCV: 105 fL — ABNORMAL HIGH (ref 78.0–100.0)
Monocytes Absolute: 0.2 10*3/uL (ref 0.1–1.0)
Neutro Abs: 1.2 10*3/uL — ABNORMAL LOW (ref 1.7–7.7)
Platelets: 34 10*3/uL — ABNORMAL LOW (ref 150–400)
RBC: 2.8 MIL/uL — ABNORMAL LOW (ref 3.87–5.11)
RDW: 15.2 % (ref 11.5–15.5)
WBC: 2.4 10*3/uL — ABNORMAL LOW (ref 4.0–10.5)

## 2011-08-12 ENCOUNTER — Ambulatory Visit
Admission: RE | Admit: 2011-08-12 | Discharge: 2011-08-12 | Disposition: A | Discharge status: Home / Self Care | Payer: Self-pay | Source: Ambulatory Visit | Attending: Infectious Diseases | Admitting: Infectious Diseases

## 2011-08-12 ENCOUNTER — Other Ambulatory Visit: Payer: Self-pay | Admitting: Infectious Diseases

## 2011-08-12 DIAGNOSIS — R7611 Nonspecific reaction to tuberculin skin test without active tuberculosis: Secondary | ICD-10-CM

## 2011-08-14 ENCOUNTER — Ambulatory Visit (INDEPENDENT_AMBULATORY_CARE_PROVIDER_SITE_OTHER): Payer: Self-pay | Admitting: Gastroenterology

## 2011-08-14 DIAGNOSIS — B182 Chronic viral hepatitis C: Secondary | ICD-10-CM

## 2011-08-14 DIAGNOSIS — K7460 Unspecified cirrhosis of liver: Secondary | ICD-10-CM

## 2011-08-14 LAB — HEPATIC FUNCTION PANEL
Alkaline Phosphatase: 55 U/L (ref 39–117)
Bilirubin, Direct: 0.3 mg/dL (ref 0.0–0.3)
Indirect Bilirubin: 0.3 mg/dL (ref 0.0–0.9)
Total Protein: 7.1 g/dL (ref 6.0–8.3)

## 2011-08-14 NOTE — Progress Notes (Signed)
[name], [name]  MR#: [id number]      DATE: 08/14/2011  DOB: [birth year]    cc:  Mia Crater, MD, [facility], [address], [address], [place], [place] [address]   [name], PMHNP-BC, [facility], [place], [address] [place], [place] [address], Fax [phone]   Consulting Physician: Mia Plummer, MD, [address], [place], [place] [address]   Mia Bolus, MD, [facility], [address] [place], [place], [place] [address]   Primary Care Physician: Same   Referring Physician: Dineen Kid. Reche Dixon, MD, [address] [place], [place] [address], Fax [phone]    REASON FOR VISIT:  Follow up of genotype 1a hepatitis C, cirrhosis, week 8 of therapy.   HISTORY:  The patient returns unaccompanied. Her start date for therapy was 06/20/2011, so she is now approximately 8 weeks into treatment. Her boceprevir istarted on 07/16/2011. It should be recalled that that although I counseled her on the proper dosing of medication for the first week and a half she only took ribavirin 200 mg b.i.d. as opposed to 600 mg b.i.d. She then increased the dose to the appropriate level.   [name] has no new complaints related to her treatment.  Her skin rash is controlled to her satisfacion.  She has some nausea with the boceprevir but does not want antiemetics.  There are no psychiatric complaints. She remains on Adderall on a p.r.n. basis.  [name] is motivated to remain on therapy.  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  Her CXR was negative so she will not be treated.  CURRENT MEDICATIONS ARE:  Adderall 20 mg b.i.d. p.r.n., methadone 10 mg daily, Seroquel 200 mg daily, Pegasys 180 mcg weekly, ribavirin 400 mg AM and 200 mg PM, boceprevir 800 mg q. 8 hours.   ALLERGIES:  Denies.   HABITS:  Smokes approximately a quarter of a pack of cigarettes per day. Alcohol denies interval consumption.     REVIEW OF SYSTEMS:  All 10 systems reviewed today with the patient and they are negative other than which is mentioned above. CES-D was 29.    PHYSICAL EXAMINATION:  Constitutional: Stated age without significant peripheral wasting. Vital signs: Height 67 inches, weight 183 pounds. Blood pressure 143/81, pulse 89, temperature 96.5 Fahrenheit.    ASSESSMENT:  The patient is a 51 year old woman with a history of genotype 1a HCV, IL 28 B CC, cirrhosis by imaging labs. Her commencement for therapy was 06/20/2011, of Pegasys and ribavirin, putting her at approximately week 8 of treatment. Boceprevir was added on 07/16/2011. Week 4 HCV RNA 284470 IU/mL (5.45 log IU/mL), baseline was 0109323 IU/mL (6.25 log IU/mL). Unfortunately, she compromised her initial dose of ribavirin. There is nothing that can be done to change this at this point, but it may explain the less than 1 log decline in the viral load from baseline. It is time for the week 8 viral load.  In terms of her cirrhosis care, she was screened for varices by EGD on 03/04/2011, and she has never bled from them. Repeat would be needed by October 2015. She was screened by ultrasound on 03/04/2011, and a repeat ultrasound will be needed by April 2013. There is no encephalopathy or ascites to treat. She was previously hepatitis A immune in the [place] office but negative at [facility]. I previously considered her immune. She received hepatitis B vaccination through [place] office in early 2010 to early  2011. She will need to receive Pneumovax and flu shot, at her primary MD.  In my discussion today with the patient, I discussed her progress to date and her viral kinetics.  Marland Kitchen  PLAN:  1. Hepatitis A immune. Hepatitis B vaccination completed on 07/05/2009. 2. Ultrasound ordered for April 2013. 3. EGD will not need to be repeated until October 2015. 4. Flu shot and Pneumovax through her primary physician. 5. CBC today. 6. Week 8 HCV RNA. 7.   Given orders for weekly CBC to be done between visits at [facility].  Mia Dare, MD    ADDENDUM  Week 8 HCV RNA No detectable level of HCV RNA.

## 2011-08-15 LAB — CBC WITH DIFFERENTIAL/PLATELET
Eosinophils Relative: 1 % (ref 0–5)
HCT: 28.7 % — ABNORMAL LOW (ref 36.0–46.0)
Hemoglobin: 8.5 g/dL — ABNORMAL LOW (ref 12.0–15.0)
Lymphocytes Relative: 38 % (ref 12–46)
Lymphs Abs: 0.7 10*3/uL (ref 0.7–4.0)
MCV: 108.7 fL — ABNORMAL HIGH (ref 78.0–100.0)
Monocytes Absolute: 0.1 10*3/uL (ref 0.1–1.0)
Monocytes Relative: 5 % (ref 3–12)
Neutro Abs: 1.1 10*3/uL — ABNORMAL LOW (ref 1.7–7.7)
WBC: 1.9 10*3/uL — ABNORMAL LOW (ref 4.0–10.5)

## 2011-08-18 LAB — HEPATITIS C RNA QUANTITATIVE

## 2011-08-25 ENCOUNTER — Ambulatory Visit (HOSPITAL_COMMUNITY)
Admission: RE | Admit: 2011-08-25 | Discharge: 2011-08-25 | Disposition: A | Discharge status: Home / Self Care | Payer: Medicare Other | Source: Ambulatory Visit | Attending: Gastroenterology | Admitting: Gastroenterology

## 2011-08-25 DIAGNOSIS — B182 Chronic viral hepatitis C: Secondary | ICD-10-CM | POA: Insufficient documentation

## 2011-08-25 DIAGNOSIS — K7460 Unspecified cirrhosis of liver: Secondary | ICD-10-CM | POA: Insufficient documentation

## 2011-08-28 ENCOUNTER — Other Ambulatory Visit: Payer: Self-pay | Admitting: Gastroenterology

## 2011-08-29 LAB — CBC WITH DIFFERENTIAL/PLATELET
Eosinophils Relative: 0 % (ref 0–5)
HCT: 30.7 % — ABNORMAL LOW (ref 36.0–46.0)
Hemoglobin: 9 g/dL — ABNORMAL LOW (ref 12.0–15.0)
Lymphocytes Relative: 36 % (ref 12–46)
Lymphs Abs: 0.8 10*3/uL (ref 0.7–4.0)
MCH: 32.6 pg (ref 26.0–34.0)
MCV: 111.2 fL — ABNORMAL HIGH (ref 78.0–100.0)
Monocytes Absolute: 0.2 10*3/uL (ref 0.1–1.0)
Monocytes Relative: 9 % (ref 3–12)
Platelets: 22 10*3/uL — CL (ref 150–400)
RBC: 2.76 MIL/uL — ABNORMAL LOW (ref 3.87–5.11)
WBC: 2.1 10*3/uL — ABNORMAL LOW (ref 4.0–10.5)

## 2011-09-03 ENCOUNTER — Emergency Department (HOSPITAL_COMMUNITY): Payer: Medicare Other

## 2011-09-03 ENCOUNTER — Encounter (HOSPITAL_COMMUNITY): Payer: Self-pay | Admitting: *Deleted

## 2011-09-03 ENCOUNTER — Inpatient Hospital Stay (HOSPITAL_COMMUNITY)
Admission: EM | Admit: 2011-09-03 | Discharge: 2011-09-08 | DRG: 194 | Disposition: A | Discharge status: Home Health | Payer: Medicare Other | Source: Ambulatory Visit | Attending: Internal Medicine | Admitting: Internal Medicine

## 2011-09-03 DIAGNOSIS — E876 Hypokalemia: Secondary | ICD-10-CM | POA: Diagnosis present

## 2011-09-03 DIAGNOSIS — E871 Hypo-osmolality and hyponatremia: Secondary | ICD-10-CM | POA: Diagnosis present

## 2011-09-03 DIAGNOSIS — K219 Gastro-esophageal reflux disease without esophagitis: Secondary | ICD-10-CM | POA: Diagnosis present

## 2011-09-03 DIAGNOSIS — F17200 Nicotine dependence, unspecified, uncomplicated: Secondary | ICD-10-CM | POA: Diagnosis present

## 2011-09-03 DIAGNOSIS — D649 Anemia, unspecified: Secondary | ICD-10-CM | POA: Diagnosis present

## 2011-09-03 DIAGNOSIS — D72819 Decreased white blood cell count, unspecified: Secondary | ICD-10-CM

## 2011-09-03 DIAGNOSIS — B182 Chronic viral hepatitis C: Secondary | ICD-10-CM | POA: Diagnosis present

## 2011-09-03 DIAGNOSIS — Z96649 Presence of unspecified artificial hip joint: Secondary | ICD-10-CM

## 2011-09-03 DIAGNOSIS — M1990 Unspecified osteoarthritis, unspecified site: Secondary | ICD-10-CM | POA: Diagnosis present

## 2011-09-03 DIAGNOSIS — R5383 Other fatigue: Secondary | ICD-10-CM | POA: Diagnosis present

## 2011-09-03 DIAGNOSIS — D61818 Other pancytopenia: Secondary | ICD-10-CM

## 2011-09-03 DIAGNOSIS — Z79899 Other long term (current) drug therapy: Secondary | ICD-10-CM

## 2011-09-03 DIAGNOSIS — K279 Peptic ulcer, site unspecified, unspecified as acute or chronic, without hemorrhage or perforation: Secondary | ICD-10-CM

## 2011-09-03 DIAGNOSIS — J189 Pneumonia, unspecified organism: Principal | ICD-10-CM | POA: Diagnosis present

## 2011-09-03 DIAGNOSIS — F319 Bipolar disorder, unspecified: Secondary | ICD-10-CM | POA: Diagnosis present

## 2011-09-03 DIAGNOSIS — F1120 Opioid dependence, uncomplicated: Secondary | ICD-10-CM | POA: Diagnosis present

## 2011-09-03 DIAGNOSIS — G894 Chronic pain syndrome: Secondary | ICD-10-CM | POA: Diagnosis present

## 2011-09-03 DIAGNOSIS — M5412 Radiculopathy, cervical region: Secondary | ICD-10-CM | POA: Diagnosis present

## 2011-09-03 DIAGNOSIS — B1920 Unspecified viral hepatitis C without hepatic coma: Secondary | ICD-10-CM

## 2011-09-03 DIAGNOSIS — D696 Thrombocytopenia, unspecified: Secondary | ICD-10-CM | POA: Diagnosis present

## 2011-09-03 HISTORY — DX: Pneumonia, unspecified organism: J18.9

## 2011-09-03 HISTORY — DX: Peptic ulcer, site unspecified, unspecified as acute or chronic, without hemorrhage or perforation: K27.9

## 2011-09-03 LAB — COMPREHENSIVE METABOLIC PANEL
Alkaline Phosphatase: 59 U/L (ref 39–117)
BUN: 6 mg/dL (ref 6–23)
CO2: 24 mEq/L (ref 19–32)
Calcium: 8.6 mg/dL (ref 8.4–10.5)
GFR calc Af Amer: >90 mL/min (ref >90)
GFR calc non Af Amer: >90 mL/min (ref >90)
Glucose, Bld: 97 mg/dL (ref 70–99)
Total Protein: 7.8 g/dL (ref 6.0–8.3)

## 2011-09-03 LAB — URINALYSIS, ROUTINE W REFLEX MICROSCOPIC
Glucose, UA: NEGATIVE mg/dL
Ketones, ur: NEGATIVE mg/dL
Leukocytes, UA: NEGATIVE
Protein, ur: NEGATIVE mg/dL

## 2011-09-03 LAB — AMMONIA: Ammonia: 44 umol/L (ref 11–60)

## 2011-09-03 LAB — CBC
MCH: 33.7 pg (ref 26.0–34.0)
MCHC: 31.4 g/dL (ref 30.0–36.0)
MCV: 107.3 fL — ABNORMAL HIGH (ref 78.0–100.0)
Platelets: 36 10*3/uL — ABNORMAL LOW (ref 150–400)
RDW: 17.2 % — ABNORMAL HIGH (ref 11.5–15.5)

## 2011-09-03 LAB — DIFFERENTIAL
Basophils Absolute: 0 10*3/uL (ref 0.0–0.1)
Eosinophils Absolute: 0 10*3/uL (ref 0.0–0.7)
Lymphs Abs: 0.7 10*3/uL (ref 0.7–4.0)
Neutro Abs: 4.5 10*3/uL (ref 1.7–7.7)

## 2011-09-03 LAB — PROTIME-INR
INR: 1.14 (ref 0.00–1.49)
Prothrombin Time: 14.8 seconds (ref 11.6–15.2)

## 2011-09-03 LAB — RAPID URINE DRUG SCREEN, HOSP PERFORMED
Cocaine: NOT DETECTED
Opiates: NOT DETECTED

## 2011-09-03 MED ORDER — SODIUM CHLORIDE 0.9 % IV SOLN
Freq: Once | INTRAVENOUS | Status: AC
  Administered 2011-09-03: via INTRAVENOUS

## 2011-09-03 MED ORDER — DEXTROSE 5 % IV SOLN
500.0000 mg | Freq: Once | INTRAVENOUS | Status: AC
  Administered 2011-09-04: 500 mg via INTRAVENOUS
  Filled 2011-09-03: qty 500

## 2011-09-03 MED ORDER — SODIUM CHLORIDE 0.9 % IV BOLUS (SEPSIS)
1000.0000 mL | INTRAVENOUS | Status: AC
  Administered 2011-09-03: 1000 mL via INTRAVENOUS

## 2011-09-03 MED ORDER — ONDANSETRON HCL 4 MG/2ML IJ SOLN
4.0000 mg | INTRAMUSCULAR | Status: DC | PRN
  Administered 2011-09-03 – 2011-09-08 (×7): 4 mg via INTRAVENOUS
  Filled 2011-09-03 (×7): qty 2

## 2011-09-03 MED ORDER — CEFTRIAXONE SODIUM 1 G IJ SOLR
1.0000 g | Freq: Once | INTRAMUSCULAR | Status: AC
  Administered 2011-09-04: 1 g via INTRAVENOUS
  Filled 2011-09-03: qty 10

## 2011-09-03 NOTE — ED Notes (Signed)
Pt reports n/v that began today however states she "hasn't felt well all week." Pt w/ hx of liver disease and currently undergoing tx for this. Pt states this past week she has felt weak like her legs were shaky.

## 2011-09-03 NOTE — ED Notes (Signed)
Patient transported to X-ray 

## 2011-09-03 NOTE — ED Notes (Signed)
Pt taking her own prescribed seroquel 300mg  - Dr. Lynelle Doctor EDP aware and ok w/ pt taking her home med.

## 2011-09-03 NOTE — ED Provider Notes (Signed)
History     CSN: [id number]  Arrival date & time 09/03/11  1911   First MD Initiated Contact with Patient 09/03/11 2102      Chief Complaint  Patient presents with  . Emesis    (Consider location/radiation/quality/duration/timing/severity/associated sxs/prior treatment) HPI  Patient is very hard to get to focus and give a size history. Patient relates over the past 5 days she has "felt herbal". She states she can't stand because she is weak. She states she has shortness of breath and nausea and vomiting but only started today. She states she's had diarrhea and a central chest pain and states "everything gets excited and my head is scalding hot". Patient states "I felt like it was killing me" she denies having suicidal ideation. She states "I just wanted to stop" she states she's been very sleepy and shaky. She states she has her usual smoker's cough. She also has a sore throat and some rhinorrhea at times. She states she feels like her ear is her "blowing up". She denies dysuria or frequency.  PCP [facility] management doctor to [facility] Liver doctor Dr. Jacqualine Mau from [facility]  Past Medical History  Diagnosis Date  . Hepatitis C infection   . Chronic pain syndrome   . Cervical radiculopathy   . Osteoarthritis   . Bipolar affective disorder   . GERD (gastroesophageal reflux disease)   . Thrombocytopenia   . Leukocytopenia    ADHD  Past Surgical History  Procedure Date  . Tonsilectomy, adenoidectomy, bilateral myringotomy and tubes   . Right knee arthroscopic surgery   . Total hip replacement   . Appendectomy   . Hyperectomy     due to obstructive ovarian cytst    History reviewed. No pertinent family history.  History   Social History  . Marital Status: Divorced    Spouse Name: N/A    Number of Children: N/A  . Years of Education: N/A   Occupational History  . Not on file.   Social History Main Topics  . Smoking status:  smokes one half pack a day   .  Smokeless tobacco: Not on file  . Alcohol Use: No  . Drug Use: Not on file  . Sexually Active: Not on file  Patient on disability for "crazy"    OB History    Grav Para Term Preterm Abortions TAB SAB Ect Mult Living                  Review of Systems  All other systems reviewed and are negative.    Allergies  Aspirin  Home Medications   Current Outpatient Rx  Name Route Sig Dispense Refill  . AMPHETAMINE-DEXTROAMPHETAMINE 20 MG PO TABS Oral Take 20 mg by mouth 2 (two) times daily as needed. For add    . BOCEPREVIR 200 MG PO CAPS Oral Take 800 mg by mouth every 8 (eight) hours.    . METHADONE HCL 10 MG/ML PO CONC Oral Take 110 mg by mouth daily.    Marland Kitchen PEGINTERFERON ALFA-2A 180 MCG/ML [place] SOLN Subcutaneous Inject 180 mcg into the skin every 7 (seven) days. Monday    . QUETIAPINE FUMARATE 300 MG PO TABS Oral Take 600 mg by mouth at bedtime.    Marland Kitchen RIBAVIRIN 200 MG PO CAPS Oral Take 200-400 mg by mouth 2 (two) times daily. 400mg  in the morning; 200mg  in the evening      BP 144/85  Pulse 103  Temp(Src) 101.9 F (38.8 C) (Oral)  Resp 16  SpO2 92%  Vital signs normal except tachycardia   Physical Exam  Nursing note and vitals reviewed. Constitutional: She is oriented to person, place, and time. She appears well-developed and well-nourished.  Non-toxic appearance. She does not appear ill. No distress.       Patient seems very restless.  HENT:  Head: Normocephalic and atraumatic.  Right Ear: External ear normal.  Left Ear: External ear normal.  Nose: Nose normal. No mucosal edema or rhinorrhea.  Mouth/Throat: Mucous membranes are normal. No dental abscesses or uvula swelling.       Mucous membranes are dry  Eyes: Conjunctivae and EOM are normal. Pupils are equal, round, and reactive to light.  Neck: Normal range of motion and full passive range of motion without pain. Neck supple.  Cardiovascular: Normal rate, regular rhythm and normal heart sounds.  Exam reveals no gallop  and no friction rub.   No murmur heard. Pulmonary/Chest: Effort normal and breath sounds normal. No respiratory distress. She has no wheezes. She has no rhonchi. She has no rales. She exhibits no tenderness and no crepitus.       Despite patient denying coughing she is noted to have a very deep cough  Abdominal: Soft. Normal appearance and bowel sounds are normal. She exhibits no distension. There is no tenderness. There is no rebound and no guarding.  Musculoskeletal: Normal range of motion. She exhibits no edema and no tenderness.       Moves all extremities well.   Neurological: She is alert and oriented to person, place, and time. She has normal strength. No cranial nerve deficit.  Skin: Skin is warm, dry and intact. No rash noted. No erythema. No pallor.  Psychiatric: She has a normal mood and affect. Her speech is normal and behavior is normal. Her mood appears not anxious.       Patient seems very restless and agitated at times. She states she thinks her methadone dose is too low states she's been on it for the past year so that she doesn't take pain medicines off the street.    ED Course  Procedures (including critical care time)   Medications  0.9 %  sodium chloride infusion (not administered)  ondansetron (ZOFRAN) injection 4 mg (4 mg Intravenous Given 09/03/11 2203)  cefTRIAXone (ROCEPHIN) 1 g in dextrose 5 % 50 mL IVPB (not administered)  azithromycin (ZITHROMAX) 500 mg in dextrose 5 % 250 mL IVPB (not administered)  sodium chloride 0.9 % bolus 1,000 mL (1000 mL Intravenous Given 09/03/11 2203)     Patient given IV fluids and Zofran for nausea. When she was rechecked at 2300 she appears to be more lucid and able to talk more succinctly. I reviewed her chest x-ray she appears to have a left lower lobe infiltrate. She was started on appropriate antibiotics. Patient denies vaginal bleeding, rectal bleeding. Her hemoglobin in the first part of February was 11.0 and gradually was  getting lower until the marked drop this visit.  23:35 Dr Adela Glimpse in ED and will admit.   Results for orders placed during the hospital encounter of 09/03/11  CBC      Component Value Range   WBC 5.6  4.0 - 10.5 (K/uL)   RBC 2.05 (*) 3.87 - 5.11 (MIL/uL)   Hemoglobin 6.9 (*) 12.0 - 15.0 (g/dL)   HCT 63.8 (*) 75.6 - 46.0 (%)   MCV 107.3 (*) 78.0 - 100.0 (fL)   MCH 33.7  26.0 - 34.0 (pg)  MCHC 31.4  30.0 - 36.0 (g/dL)   RDW 04.5 (*) 40.9 - 15.5 (%)   Platelets 36 (*) 150 - 400 (K/uL)  DIFFERENTIAL      Component Value Range   Neutrophils Relative 79 (*) 43 - 77 (%)   Lymphocytes Relative 13  12 - 46 (%)   Monocytes Relative 8  3 - 12 (%)   Eosinophils Relative 0  0 - 5 (%)   Basophils Relative 0  0 - 1 (%)   nRBC 1 (*) 0 (/100 WBC)   Neutro Abs 4.5  1.7 - 7.7 (K/uL)   Lymphs Abs 0.7  0.7 - 4.0 (K/uL)   Monocytes Absolute 0.4  0.1 - 1.0 (K/uL)   Eosinophils Absolute 0.0  0.0 - 0.7 (K/uL)   Basophils Absolute 0.0  0.0 - 0.1 (K/uL)   RBC Morphology POLYCHROMASIA PRESENT     WBC Morphology INCREASED BANDS (>20% BANDS)    COMPREHENSIVE METABOLIC PANEL      Component Value Range   Sodium 132 (*) 135 - 145 (mEq/L)   Potassium 3.4 (*) 3.5 - 5.1 (mEq/L)   Chloride 98  96 - 112 (mEq/L)   CO2 24  19 - 32 (mEq/L)   Glucose, Bld 97  70 - 99 (mg/dL)   BUN 6  6 - 23 (mg/dL)   Creatinine, Ser 8.11  0.50 - 1.10 (mg/dL)   Calcium 8.6  8.4 - 91.4 (mg/dL)   Total Protein 7.8  6.0 - 8.3 (g/dL)   Albumin 3.3 (*) 3.5 - 5.2 (g/dL)   AST 30  0 - 37 (U/L)   ALT 14  0 - 35 (U/L)   Alkaline Phosphatase 59  39 - 117 (U/L)   Total Bilirubin 0.7  0.3 - 1.2 (mg/dL)   GFR calc non Af Amer >90  >90 (mL/min)   GFR calc Af Amer >90  >90 (mL/min)  AMMONIA      Component Value Range   Ammonia 44  11 - 60 (umol/L)  APTT      Component Value Range   aPTT 34  24 - 37 (seconds)  PROTIME-INR      Component Value Range   Prothrombin Time 14.8  11.6 - 15.2 (seconds)   INR 1.14  0.00 - 1.49   URINALYSIS,  ROUTINE W REFLEX MICROSCOPIC      Component Value Range   Color, Urine AMBER (*) YELLOW    APPearance CLEAR  CLEAR    Specific Gravity, Urine 1.019  1.005 - 1.030    pH 6.5  5.0 - 8.0    Glucose, UA NEGATIVE  NEGATIVE (mg/dL)   Hgb urine dipstick NEGATIVE  NEGATIVE    Bilirubin Urine SMALL (*) NEGATIVE    Ketones, ur NEGATIVE  NEGATIVE (mg/dL)   Protein, ur NEGATIVE  NEGATIVE (mg/dL)   Urobilinogen, UA >7.8 (*) 0.0 - 1.0 (mg/dL)   Nitrite NEGATIVE  NEGATIVE    Leukocytes, UA NEGATIVE  NEGATIVE   URINE RAPID DRUG SCREEN (HOSP PERFORMED)      Component Value Range   Opiates NONE DETECTED  NONE DETECTED    Cocaine NONE DETECTED  NONE DETECTED    Benzodiazepines NONE DETECTED  NONE DETECTED    Amphetamines NONE DETECTED  NONE DETECTED    Tetrahydrocannabinol NONE DETECTED  NONE DETECTED    Barbiturates NONE DETECTED  NONE DETECTED    Dg Chest 1 View  08/12/2011  *RADIOLOGY REPORT*  Clinical Data: Positive PPD, history of liver disease  CHEST -  1 VIEW  Comparison: Chest x-ray of 06/05/2011  Findings: The lungs are clear.  The heart is within upper limits of normal.  The retrocardiac opacity probably represents a small hiatal hernia.  A lower anterior cervical spine fusion plate is present.  IMPRESSION: No active lung disease.  Probable small hiatal hernia.  Original Report Authenticated By: Juline Patch, M.D.    No results found.   1. Community acquired pneumonia   2. Anemia     Plan admission   Devoria Albe, MD, FACEP  MDM          Ward Givens, MD 09/03/11 [phone]

## 2011-09-03 NOTE — ED Notes (Signed)
Pt reports N/V since yesterday.  Pt noted to be asleep in triage.  Pt also reports a productive cough that is brown.  Pt very vague reports that 'she just doesn't feel good'.

## 2011-09-03 NOTE — H&P (Signed)
PCP:   Jearld Lesch, MD, MD    Chief Complaint:   weakness  HPI: [name] is a 52 y.o. female   has a past medical history of Hepatitis C infection; Chronic pain syndrome; Cervical radiculopathy; Osteoarthritis; Bipolar affective disorder; GERD (gastroesophageal reflux disease); Thrombocytopenia; Leukocytopenia; Peptic ulcer disease; and Pneumonia.   Presented with  Feeling poorly for few weeks, coughing in the past few days she developed fever. She is currently being treated by Dr. Marnee Spring and ks at [facility] for hepatitis C  with interferon injections.  Her PPD was positive 2 weeks ago but CXR was negative. She had remote history of peptic ulcer disease but not currently denies any bases. She denies melena or hematemesis. But her hemoglobin has trended down to 6.9. She had been feeling weak and poorly for the past few weeks. Of note she regularly gets methadone on a daily basis 110 mg in the morning. Methadone clinic number [phone]  Review of Systems:    Pertinent positives include:Fevers, chills, fatigue, vomiting,  Constitutional:  No weight loss, night sweats, , weight loss  HEENT:  No headaches, Difficulty swallowing,Tooth/dental problems,Sore throat,  No sneezing, itching, ear ache, nasal congestion, post nasal drip,  Cardio-vascular:  No chest pain, Orthopnea, PND, anasarca, dizziness, palpitations.no Bilateral lower extremity swelling  GI:  No heartburn, indigestion, abdominal pain, nausea,  diarrhea, change in bowel habits, loss of appetite, melena, blood in stool, hematoemesis Resp:  no shortness of breath at rest. No dyspnea on exertion, No excess mucus, no productive cough, No non-productive cough, No coughing up of blood.No change in color of mucus.No wheezing. Skin:  no rash or lesions. No jaundice GU:  no dysuria, change in color of urine, no urgency or frequency. No straining to urinate.  No flank pain.  Musculoskeletal:  No joint pain or no joint  swelling. No decreased range of motion. No back pain.  Psych:  No change in mood or affect. No depression or anxiety. No memory loss.  Neuro: no localizing neurological complaints, no tingling, no weakness, no double vision, no gait abnormality, no slurred speech, no confusion  Otherwise ROS are negative except for above, 10 systems were reviewed  Past Medical History: Past Medical History  Diagnosis Date  . Hepatitis C infection   . Chronic pain syndrome   . Cervical radiculopathy   . Osteoarthritis   . Bipolar affective disorder   . GERD (gastroesophageal reflux disease)   . Thrombocytopenia   . Leukocytopenia   . Peptic ulcer disease   . Pneumonia    Past Surgical History  Procedure Date  . Tonsilectomy, adenoidectomy, bilateral myringotomy and tubes   . Right knee arthroscopic surgery   . Total hip replacement   . Appendectomy   . Hyperectomy     due to obstructive ovarian cytst     Medications: Prior to Admission medications   Medication Sig Start Date End Date Taking? Authorizing Provider  amphetamine-dextroamphetamine (ADDERALL) 20 MG tablet Take 20 mg by mouth 2 (two) times daily as needed. For add   Yes Historical Provider, MD  Boceprevir 200 MG CAPS Take 800 mg by mouth every 8 (eight) hours.   Yes Historical Provider, MD  methadone (DOLOPHINE) 10 MG/ML solution Take 110 mg by mouth daily.   Yes Historical Provider, MD  peginterferon alfa-2a (PEGASYS) 180 MCG/ML injection Inject 180 mcg into the skin every 7 (seven) days. Monday   Yes Historical Provider, MD  QUEtiapine (SEROQUEL) 300 MG tablet Take 600  mg by mouth at bedtime.   Yes Historical Provider, MD  ribavirin (REBETOL) 200 MG capsule Take 200-400 mg by mouth 2 (two) times daily. 400mg  in the morning; 200mg  in the evening   Yes Historical Provider, MD    Allergies:   Allergies  Allergen Reactions  . Aspirin     Vomits blood    Social History:  Ambulatory  Independently  Lives at  home   reports  that she has been smoking.  She does not have any smokeless tobacco history on file. She reports that she does not drink alcohol or use illicit drugs.   Family History: family history includes Breast cancer in her sister.    Physical Exam: Patient Vitals for the past 24 hrs:  BP Temp Temp src Pulse Resp SpO2  09/03/11 2225 119/64 mmHg 100.2 F (37.9 C) Oral 96  20  95 %  09/03/11 1928 - - - - - 92 %  09/03/11 1925 144/85 mmHg 101.9 F (38.8 C) Oral 103  16  90 %    1. General:  in No Acute distress 2. Psychological: Alert and  Oriented slightly agitated 3. Head/ENT:    Dry Mucous Membranes                          Head Non traumatic, neck supple                          Normal  Dentition 4. SKIN:  decreased Skin turgor,  Skin clean Dry and intact no rash 5. Heart: Regular rate and rhythm no Murmur, Rub or gallop 6. Lungs: Occasional crackles   7. Abdomen: Soft, non-tender, Non distended obese no ascites present 8. Lower extremities: no clubbing, cyanosis, or edema 9. Neurologically Grossly intact, moving all 4 extremities equally 10. MSK: Normal range of motion  body mass index is unknown because there is no height or weight on file.   Labs on Admission:   [facility] 09/03/11 2201  NA 132*  K 3.4*  CL 98  CO2 24  GLUCOSE 97  BUN 6  CREATININE 0.66  CALCIUM 8.6  MG --  PHOS --    Basename 09/03/11 2201  AST 30  ALT 14  ALKPHOS 59  BILITOT 0.7  PROT 7.8  ALBUMIN 3.3*   No results found for this basename: LIPASE:2,AMYLASE:2 in the last 72 hours  Basename 09/03/11 2201  WBC 5.6  NEUTROABS 4.5  HGB 6.9*  HCT 22.0*  MCV 107.3*  PLT 36*   No results found for this basename: CKTOTAL:3,CKMB:3,CKMBINDEX:3,TROPONINI:3 in the last 72 hours No results found for this basename: TSH,T4TOTAL,FREET3,T3FREE,THYROIDAB in the last 72 hours No results found for this basename: VITAMINB12:2,FOLATE:2,FERRITIN:2,TIBC:2,IRON:2,RETICCTPCT:2 in the last 72 hours No results found  for this basename: HGBA1C    The CrCl is unknown because both a height and weight (above a minimum accepted value) are required for this calculation. ABG    Component Value Date/Time   TCO2 25 02/28/2010 1603     Lab Results  Component Value Date   DDIMER  Value: 0.28        AT THE INHOUSE ESTABLISHED CUTOFF VALUE OF 0.48 ug/mL FEU, THIS ASSAY HAS BEEN DOCUMENTED IN THE LITERATURE TO HAVE 10/25/2007     Other results:  I have pearsonaly reviewed this: ECG REPORT  Rate: 99  Rhythm: Sinus rhythm ST&T Change: No ischemic changes  UA no evidence of infection  Ammonia 44  Cultures:    Component Value Date/Time   SDES SPUTUM 12/19/2010 0747   SPECREQUEST NONE 12/19/2010 0747   CULT NORMAL OROPHARYNGEAL FLORA 12/19/2010 0747   REPTSTATUS 12/22/2010 FINAL 12/19/2010 0747       Radiological Exams on Admission: Dg Chest 2 View  09/04/2011  *RADIOLOGY REPORT*  Clinical Data: Fever, cough and congestion; body aches.  History of smoking.  CHEST - 2 VIEW  Comparison: Chest radiograph performed 08/12/2011  Findings: The lungs are well-aerated.  Dense retrocardiac and patchy bilateral airspace opacification raises concern for multifocal pneumonia.  There is no evidence of pleural effusion or pneumothorax.  The heart is normal in size; calcification is noted in the aortic arch.  Cervical spinal fusion hardware is partially imaged.  No acute osseous abnormalities are seen.  IMPRESSION: Dense retrocardiac and patchy bilateral airspace opacities raise concern for multifocal pneumonia.  Original Report Authenticated By: Tonia Ghent, M.D.    Assessment/Plan  This is a 51 year old female history of hepatitis C on interferon presents with fatigue and fever likely secondary to pneumonia but also progressive anemia with hemoglobin down to 6.9.  Present on Admission:  .Pneumonia - for right now and treat as community-acquired pneumonia with Rocephin and azythro this could be father broadend if needed    .Thrombocytopenia - chronic and stable  .Hepatitis C infection - continue home medications  .Hyponatremia - likely related to chronic liver disease but will give gentle fluids and check urine lites  .Hypokalemia - will replace  .Anemia - given elevated MCV obtain an anemia panel will Hemoccult stools, transfuse 2 units given her hemoglobin is below 7, she may benefit from GI evaluation if there's any evidence of Hemoccult positive stools Methadone use chronic - in the morning please contact The methadone clinic to confirm her dose and she could receive her regular schedule methadone while in the hospital   Prophylaxis: SCD  Protonix  CODE STATUS: FULL CODE  I have spent a total of 65 min on this admition  Patricio Popwell 09/03/2011, 11:50 PM

## 2011-09-03 NOTE — ED Notes (Signed)
Pt refusing to take medication for her fever.  Reports that she does not want any medication because she does not feel well.

## 2011-09-04 ENCOUNTER — Encounter (HOSPITAL_COMMUNITY): Payer: Self-pay | Admitting: General Practice

## 2011-09-04 ENCOUNTER — Inpatient Hospital Stay (HOSPITAL_COMMUNITY): Payer: Medicare Other

## 2011-09-04 ENCOUNTER — Ambulatory Visit: Payer: Medicare Other | Admitting: Gastroenterology

## 2011-09-04 DIAGNOSIS — D649 Anemia, unspecified: Secondary | ICD-10-CM | POA: Diagnosis present

## 2011-09-04 DIAGNOSIS — E876 Hypokalemia: Secondary | ICD-10-CM | POA: Diagnosis present

## 2011-09-04 DIAGNOSIS — E871 Hypo-osmolality and hyponatremia: Secondary | ICD-10-CM | POA: Diagnosis present

## 2011-09-04 LAB — DIFFERENTIAL
Blasts: 0 %
Eosinophils Absolute: 0 10*3/uL (ref 0.0–0.7)
Eosinophils Relative: 0 % (ref 0–5)
Lymphocytes Relative: 21 % (ref 12–46)
Lymphs Abs: 1.2 10*3/uL (ref 0.7–4.0)
Monocytes Absolute: 0.1 10*3/uL (ref 0.1–1.0)
Monocytes Relative: 2 % — ABNORMAL LOW (ref 3–12)
Neutro Abs: 4.3 10*3/uL (ref 1.7–7.7)
Neutrophils Relative %: 77 % (ref 43–77)
nRBC: 1 /100 WBC — ABNORMAL HIGH

## 2011-09-04 LAB — CBC
MCV: 102 fL — ABNORMAL HIGH (ref 78.0–100.0)
Platelets: 30 10*3/uL — ABNORMAL LOW (ref 150–400)
RBC: 2.48 MIL/uL — ABNORMAL LOW (ref 3.87–5.11)
RDW: 19.8 % — ABNORMAL HIGH (ref 11.5–15.5)
WBC: 5.6 10*3/uL (ref 4.0–10.5)

## 2011-09-04 LAB — IRON AND TIBC
Iron: 26 ug/dL — ABNORMAL LOW (ref 42–135)
Saturation Ratios: 12 % — ABNORMAL LOW (ref 20–55)
TIBC: 225 ug/dL — ABNORMAL LOW (ref 250–470)
UIBC: 199 ug/dL (ref 125–400)

## 2011-09-04 LAB — URINALYSIS, MICROSCOPIC ONLY
Bilirubin Urine: NEGATIVE
Glucose, UA: NEGATIVE mg/dL
Hgb urine dipstick: NEGATIVE
Ketones, ur: NEGATIVE mg/dL
Protein, ur: NEGATIVE mg/dL
pH: 6 (ref 5.0–8.0)

## 2011-09-04 LAB — COMPREHENSIVE METABOLIC PANEL
Albumin: 2.6 g/dL — ABNORMAL LOW (ref 3.5–5.2)
Alkaline Phosphatase: 48 U/L (ref 39–117)
BUN: 5 mg/dL — ABNORMAL LOW (ref 6–23)
Chloride: 98 mEq/L (ref 96–112)
Creatinine, Ser: 0.61 mg/dL (ref 0.50–1.10)
GFR calc Af Amer: >90 mL/min (ref >90)
Glucose, Bld: 89 mg/dL (ref 70–99)
Potassium: 3.6 mEq/L (ref 3.5–5.1)
Total Bilirubin: 0.6 mg/dL (ref 0.3–1.2)

## 2011-09-04 LAB — TRANSFUSION REACTION
DAT C3: NEGATIVE
Post RXN DAT IgG: NEGATIVE

## 2011-09-04 LAB — FERRITIN: Ferritin: 435 ng/mL — ABNORMAL HIGH (ref 10–291)

## 2011-09-04 LAB — URINE CULTURE
Colony Count: 10000
Culture  Setup Time: 201304102200

## 2011-09-04 LAB — OSMOLALITY, URINE: Osmolality, Ur: 370 mOsm/kg — ABNORMAL LOW (ref 390–1090)

## 2011-09-04 LAB — RETICULOCYTES: Retic Ct Pct: 2.3 % (ref 0.4–3.1)

## 2011-09-04 LAB — PREPARE RBC (CROSSMATCH)

## 2011-09-04 MED ORDER — VANCOMYCIN HCL IN DEXTROSE 1-5 GM/200ML-% IV SOLN
1000.0000 mg | Freq: Three times a day (TID) | INTRAVENOUS | Status: DC
  Administered 2011-09-04 – 2011-09-06 (×7): 1000 mg via INTRAVENOUS
  Filled 2011-09-04 (×11): qty 200

## 2011-09-04 MED ORDER — BOCEPREVIR 200 MG PO CAPS
800.0000 mg | ORAL_CAPSULE | Freq: Three times a day (TID) | ORAL | Status: DC
  Administered 2011-09-04 – 2011-09-08 (×5): 800 mg via ORAL
  Filled 2011-09-04 (×8): qty 1

## 2011-09-04 MED ORDER — RIBAVIRIN 200 MG PO CAPS
200.0000 mg | ORAL_CAPSULE | Freq: Every day | ORAL | Status: DC
  Administered 2011-09-04: 200 mg via ORAL
  Filled 2011-09-04 (×2): qty 1

## 2011-09-04 MED ORDER — PANTOPRAZOLE SODIUM 40 MG PO TBEC
40.0000 mg | DELAYED_RELEASE_TABLET | Freq: Two times a day (BID) | ORAL | Status: DC
  Administered 2011-09-05 – 2011-09-08 (×5): 40 mg via ORAL
  Filled 2011-09-04 (×6): qty 1

## 2011-09-04 MED ORDER — ACETAMINOPHEN 325 MG PO TABS
650.0000 mg | ORAL_TABLET | Freq: Once | ORAL | Status: AC
  Administered 2011-09-06: 650 mg via ORAL
  Filled 2011-09-04: qty 2

## 2011-09-04 MED ORDER — PIPERACILLIN-TAZOBACTAM 3.375 G IVPB
3.3750 g | Freq: Three times a day (TID) | INTRAVENOUS | Status: DC
  Administered 2011-09-04 – 2011-09-06 (×6): 3.375 g via INTRAVENOUS
  Filled 2011-09-04 (×11): qty 50

## 2011-09-04 MED ORDER — AZITHROMYCIN 500 MG PO TABS
500.0000 mg | ORAL_TABLET | Freq: Every day | ORAL | Status: DC
  Filled 2011-09-04: qty 1

## 2011-09-04 MED ORDER — DEXTROSE 5 % IV SOLN
500.0000 mg | INTRAVENOUS | Status: AC
  Administered 2011-09-04 – 2011-09-06 (×3): 500 mg via INTRAVENOUS
  Filled 2011-09-04 (×3): qty 500

## 2011-09-04 MED ORDER — METHADONE HCL 40 MG PO TBSO: 120.0000 mg | ORAL_TABLET | Freq: Every day | ORAL | Status: DC

## 2011-09-04 MED ORDER — PANTOPRAZOLE SODIUM 40 MG IV SOLR
40.0000 mg | Freq: Two times a day (BID) | INTRAVENOUS | Status: DC
  Filled 2011-09-04 (×2): qty 40

## 2011-09-04 MED ORDER — METHADONE HCL 10 MG PO TABS
120.0000 mg | ORAL_TABLET | Freq: Every day | ORAL | Status: DC
  Administered 2011-09-04: 120 mg via ORAL
  Filled 2011-09-04: qty 12

## 2011-09-04 MED ORDER — POTASSIUM CHLORIDE 20 MEQ/15ML (10%) PO LIQD
40.0000 meq | Freq: Once | ORAL | Status: DC
  Filled 2011-09-04: qty 30

## 2011-09-04 MED ORDER — QUETIAPINE FUMARATE 300 MG PO TABS
600.0000 mg | ORAL_TABLET | Freq: Every day | ORAL | Status: DC
  Administered 2011-09-04: 600 mg via ORAL
  Administered 2011-09-05: 150 mg via ORAL
  Filled 2011-09-04 (×3): qty 2

## 2011-09-04 MED ORDER — POTASSIUM CHLORIDE CRYS ER 20 MEQ PO TBCR
40.0000 meq | EXTENDED_RELEASE_TABLET | Freq: Once | ORAL | Status: DC
  Filled 2011-09-04: qty 2

## 2011-09-04 MED ORDER — SODIUM CHLORIDE 0.9 % IJ SOLN
10.0000 mL | INTRAMUSCULAR | Status: DC | PRN
  Administered 2011-09-07 (×2): 10 mL

## 2011-09-04 MED ORDER — PIPERACILLIN-TAZOBACTAM 3.375 G IVPB 30 MIN: 3.3750 g | Freq: Three times a day (TID) | INTRAVENOUS | Status: DC

## 2011-09-04 MED ORDER — DEXTROSE 5 % IV SOLN: 1.0000 g | Freq: Every day | INTRAVENOUS | Status: DC

## 2011-09-04 MED ORDER — AMPHETAMINE-DEXTROAMPHETAMINE 20 MG PO TABS: 20.0000 mg | ORAL_TABLET | Freq: Two times a day (BID) | ORAL | Status: DC

## 2011-09-04 MED ORDER — SODIUM CHLORIDE 0.9 % IV SOLN
INTRAVENOUS | Status: DC
  Administered 2011-09-06: 20 mL/h via INTRAVENOUS

## 2011-09-04 MED ORDER — RIBAVIRIN 200 MG PO CAPS
400.0000 mg | ORAL_CAPSULE | Freq: Every day | ORAL | Status: DC
  Filled 2011-09-04 (×3): qty 2

## 2011-09-04 MED ORDER — ONDANSETRON HCL 4 MG/2ML IJ SOLN: 4.0000 mg | Freq: Four times a day (QID) | INTRAMUSCULAR | Status: AC | PRN

## 2011-09-04 MED ORDER — METHADONE HCL 5 MG PO TABS: 120.0000 mg | ORAL_TABLET | Freq: Every day | ORAL | Status: DC

## 2011-09-04 NOTE — Progress Notes (Signed)
Utilization Review Completed.[name] T4/03/2012   

## 2011-09-04 NOTE — Progress Notes (Signed)
[name] is a 51 y.o. female admitted with chills and cough, and was found to have multilobar pna. She is on immunosuppressants for hep c. She says her ppd was negative(although there is suggestion in notes that it was positive. I have reviewed her chart, seen and examined her at bed side. She says she has been occasionally confused in the last 2 weeks. She feels about the same this morning. She developed fever during prbc transfusion but this seems likely just pna related fever.    1. Community acquired pneumonia   2. Anemia     Past Medical History  Diagnosis Date  . Hepatitis C infection   . Chronic pain syndrome   . Cervical radiculopathy   . Osteoarthritis   . Bipolar affective disorder   . GERD (gastroesophageal reflux disease)   . Thrombocytopenia   . Leukocytopenia   . Peptic ulcer disease   . Pneumonia    Current Facility-Administered Medications  Medication Dose Route Frequency Provider Last Rate Last Dose  . 0.9 %  sodium chloride infusion   Intravenous Once Ward Givens, MD 100 mL/hr at 09/03/11 2333    . 0.9 %  sodium chloride infusion   Intravenous Continuous Viki Carrera, MD      . acetaminophen (TYLENOL) tablet 650 mg  650 mg Oral Once Ward Givens, MD      . amphetamine-dextroamphetamine (ADDERALL) tablet 20 mg  20 mg Oral BID WC Therisa Doyne, MD      . azithromycin (ZITHROMAX) 500 mg in dextrose 5 % 250 mL IVPB  500 mg Intravenous Once Ward Givens, MD   500 mg at 09/04/11 0108  . azithromycin (ZITHROMAX) 500 mg in dextrose 5 % 250 mL IVPB  500 mg Intravenous Q24H Azaylah Stailey, MD      . Boceprevir CAPS 800 mg  800 mg Oral Q8H Anastassia Doutova, MD      . cefTRIAXone (ROCEPHIN) 1 g in dextrose 5 % 50 mL IVPB  1 g Intravenous Once Ward Givens, MD   1 g at 09/04/11 0015  . methadone (METHADOSE) disintegrating tablet 120 mg  120 mg Oral Daily Karl Knarr, MD      . ondansetron (ZOFRAN) injection 4 mg  4 mg Intravenous Q20 Min PRN Ward Givens, MD   4 mg at  09/03/11 2203  . ondansetron (ZOFRAN) injection 4 mg  4 mg Intravenous Q6H PRN Ward Givens, MD      . pantoprazole (PROTONIX) injection 40 mg  40 mg Intravenous Q12H Therisa Doyne, MD      . piperacillin-tazobactam (ZOSYN) IVPB 3.375 g  3.375 g Intravenous Q8H Medha Pippen, MD      . potassium chloride 20 MEQ/15ML (10%) liquid 40 mEq  40 mEq Oral Once Melquisedec Journey, MD      . potassium chloride SA (K-DUR,KLOR-CON) CR tablet 40 mEq  40 mEq Oral Once Therisa Doyne, MD      . QUEtiapine (SEROQUEL) tablet 600 mg  600 mg Oral QHS Therisa Doyne, MD      . ribavirin (REBETOL) capsule 200 mg  200 mg Oral QHS Adalina Dopson, MD      . ribavirin (REBETOL) capsule 400 mg  400 mg Oral Daily Anastassia Doutova, MD      . sodium chloride 0.9 % bolus 1,000 mL  1,000 mL Intravenous STAT Ward Givens, MD   1,000 mL at 09/03/11 2203  . DISCONTD: azithromycin (ZITHROMAX) tablet 500 mg  500 mg  Oral QHS Therisa Doyne, MD      . DISCONTD: cefTRIAXone (ROCEPHIN) 1 g in dextrose 5 % 50 mL IVPB  1 g Intravenous QHS Therisa Doyne, MD      . DISCONTD: piperacillin-tazobactam (ZOSYN) IVPB 3.375 g  3.375 g Intravenous Q8H Analea Muller, MD       Allergies  Allergen Reactions  . Aspirin     Vomits blood   Principal Problem:  *Pneumonia Active Problems:  Thrombocytopenia  Hepatitis C infection  Fatigue  Hyponatremia  Hypokalemia  Anemia   Vital signs in last 24 hours: Temp:  [98.3 F (36.8 C)-102.3 F (39.1 C)] 101.2 F (38.4 C) (04/11 0715) Pulse Rate:  [74-103] 74  (04/11 0715) Resp:  [16-20] 18  (04/11 0715) BP: (110-144)/(53-85) 117/65 mmHg (04/11 0715) SpO2:  [90 %-97 %] 93 % (04/11 0602) Weight:  [81 kg (178 lb 9.2 oz)] 81 kg (178 lb 9.2 oz) (04/11 0132) Weight change:     Intake/Output from previous day: 04/10 0701 - 04/11 0700 In: 340 [Blood:340] Out: -  Intake/Output this shift:    Lab Results:  Basename 09/04/11 0520 09/03/11 2201  WBC 5.6 5.6  HGB 8.2* 6.9*    HCT 25.3* 22.0*  PLT PENDING 36*   BMET  Basename 09/04/11 0520 09/03/11 2201  NA 132* 132*  K 3.6 3.4*  CL 98 98  CO2 24 24  GLUCOSE 89 97  BUN 5* 6  CREATININE 0.61 0.66  CALCIUM 7.8* 8.6    Studies/Results: Dg Chest 2 View  09/04/2011  *RADIOLOGY REPORT*  Clinical Data: Fever, cough and congestion; body aches.  History of smoking.  CHEST - 2 VIEW  Comparison: Chest radiograph performed 08/12/2011  Findings: The lungs are well-aerated.  Dense retrocardiac and patchy bilateral airspace opacification raises concern for multifocal pneumonia.  There is no evidence of pleural effusion or pneumothorax.  The heart is normal in size; calcification is noted in the aortic arch.  Cervical spinal fusion hardware is partially imaged.  No acute osseous abnormalities are seen.  IMPRESSION: Dense retrocardiac and patchy bilateral airspace opacities raise concern for multifocal pneumonia.  Original Report Authenticated By: Tonia Ghent, M.D.    Medications: I have reviewed the patient's current medications.   Physical exam GENERAL- alert HEAD- normal atraumatic, no neck masses, normal thyroid, no jvd RESPIRATORY-basilar rhochi, more on the right side.CVS- regular rate and rhythm, S1, S2 normal, no murmur, click, rub or gallop ABDOMEN- abdomen is soft without significant tenderness, masses, organomegaly or guarding NEURO- Grossly normal EXTREMITIES- extremities normal, atraumatic, no cyanosis or edema  Plan   * Pneumonia- multilobar, in immunocompromised. Still febrile. Will change abx to vanc/zosyn/zithromax, then deescalate with improvement. Ppd was negative per patient.  * Pancytopenia- hep c related. Transfuse as necessary.  * AMS- unclear cause. ?cerebral edema. Ct brain/ rpr/folllow tsh/vitamin b12.  * Hepatitis C infection- on ribavirin per gi  * Hyponatremia/ Hypokalemia- related to cirrhosis. 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 * Poor iv access- place PICC line. Condition very closely guarded.    Royalti Schauf 09/04/2011 7:41 AM Pager: [phone].

## 2011-09-04 NOTE — Progress Notes (Signed)
ANTIBIOTIC CONSULT NOTE - INITIAL  Pharmacy Consult for Vancomycin Indication: pneumonia  Allergies  Allergen Reactions  . Aspirin     Vomits blood    Patient Measurements: Weight: 178 lb 9.2 oz (81 kg)  Vital Signs: Temp: 101.2 F (38.4 C) (04/11 0715) Temp src: Oral (04/11 0640) BP: 117/65 mmHg (04/11 0715) Pulse Rate: 74  (04/11 0715) Intake/Output from previous day: 04/10 0701 - 04/11 0700 In: 340 [Blood:340] Out: 275 [Urine:275]  Labs:  [facility] 09/04/11 0520 09/03/11 2201  WBC 5.6 5.6  HGB 8.2* 6.9*  PLT 30* 36*  LABCREA -- --  CREATININE 0.61 0.66   The CrCl is unknown because both a height and weight (above a minimum accepted value) are required for this calculation. No results found for this basename: VANCOTROUGH:2,VANCOPEAK:2,VANCORANDOM:2,GENTTROUGH:2,GENTPEAK:2,GENTRANDOM:2,TOBRATROUGH:2,TOBRAPEAK:2,TOBRARND:2,AMIKACINPEAK:2,AMIKACINTROU:2,AMIKACIN:2, in the last 72 hours   Microbiology: No results found for this or any previous visit (from the past 720 hour(s)).  Medical History: Past Medical History  Diagnosis Date  . Hepatitis C infection   . Chronic pain syndrome   . Cervical radiculopathy   . Osteoarthritis   . Bipolar affective disorder   . GERD (gastroesophageal reflux disease)   . Thrombocytopenia   . Leukocytopenia   . Peptic ulcer disease   . Pneumonia     Assessment: [name] presenting with weakness/fatigue, chills, and fever. CXR demonstrated multifocal community acquire PNA.  Pharmacy consulted to dose vanc in patient who is immunocompromised from treatment for Hep C.  Patient with stable renal function.  Vanc 4/11 >> Zosyn 4/11 >> Azith 4/11 >> Rocephin 4/11 > 4/11  4/10 blood cx - pending 4/10 urine cx - pending   Goal of Therapy:  Vancomycin trough level 15-20 mcg/ml   Plan:  - Begin vancomycin 1000 mg IV Q8H - Continue Zosyn 3.375gm IV Q8H, azithromycin 500mg  IV Q24H - Monitor temperature, other s/sx of infection,  clinical course to de-escalate abx - Monitor vanc level if indicated, renal function, C/S    [name], PharmD Candidate [name], PharmD, BCPS 09/04/2011,8:47 AM

## 2011-09-04 NOTE — Progress Notes (Signed)
Late entry  Patient spiked fever 101.2 oral. Blood stopped, MD notified. Orders to stop blood and proceed with blood transfusion reaction protocol. At this time Dr Venetia Constable at bedside.

## 2011-09-05 LAB — TYPE AND SCREEN
ABO/RH(D): A POS
Antibody Screen: NEGATIVE
Unit division: 0
Unit division: 0

## 2011-09-05 LAB — COMPREHENSIVE METABOLIC PANEL
ALT: 14 U/L (ref 0–35)
Albumin: 2.8 g/dL — ABNORMAL LOW (ref 3.5–5.2)
Alkaline Phosphatase: 54 U/L (ref 39–117)
Calcium: 8.5 mg/dL (ref 8.4–10.5)
Chloride: 94 mEq/L — ABNORMAL LOW (ref 96–112)
Total Protein: 7.2 g/dL (ref 6.0–8.3)

## 2011-09-05 LAB — CBC
HCT: 28.7 % — ABNORMAL LOW (ref 36.0–46.0)
Hemoglobin: 9 g/dL — ABNORMAL LOW (ref 12.0–15.0)
MCH: 32.3 pg (ref 26.0–34.0)
MCV: 102.9 fL — ABNORMAL HIGH (ref 78.0–100.0)
Platelets: 27 10*3/uL — CL (ref 150–400)
RBC: 2.79 MIL/uL — ABNORMAL LOW (ref 3.87–5.11)
WBC: 7 10*3/uL (ref 4.0–10.5)

## 2011-09-05 LAB — MAGNESIUM: Magnesium: 1.9 mg/dL (ref 1.5–2.5)

## 2011-09-05 MED ORDER — RIBAVIRIN 200 MG PO CAPS
400.0000 mg | ORAL_CAPSULE | Freq: Every day | ORAL | Status: DC
  Administered 2011-09-08: 400 mg via ORAL

## 2011-09-05 MED ORDER — RIBAVIRIN 200 MG PO CAPS: 200.0000 mg | ORAL_CAPSULE | Freq: Every day | ORAL | Status: DC

## 2011-09-05 MED ORDER — METHADONE HCL 10 MG PO TABS
110.0000 mg | ORAL_TABLET | Freq: Every day | ORAL | Status: DC
  Administered 2011-09-06 – 2011-09-08 (×3): 110 mg via ORAL
  Filled 2011-09-05 (×3): qty 11

## 2011-09-05 NOTE — Progress Notes (Signed)
SUBJECTIVE Feels ok, no complaints.   1. Community acquired pneumonia   2. Anemia     Past Medical History  Diagnosis Date  . Hepatitis C infection   . Chronic pain syndrome   . Cervical radiculopathy   . Osteoarthritis   . Bipolar affective disorder   . GERD (gastroesophageal reflux disease)   . Thrombocytopenia   . Leukocytopenia   . Peptic ulcer disease   . Pneumonia    Current Facility-Administered Medications  Medication Dose Route Frequency Provider Last Rate Last Dose  . 0.9 %  sodium chloride infusion   Intravenous Continuous Romello Hoehn, MD      . acetaminophen (TYLENOL) tablet 650 mg  650 mg Oral Once Ward Givens, MD      . amphetamine-dextroamphetamine (ADDERALL) tablet 20 mg  20 mg Oral BID WC Therisa Doyne, MD      . azithromycin (ZITHROMAX) 500 mg in dextrose 5 % 250 mL IVPB  500 mg Intravenous Q24H Taitum Menton, MD   500 mg at 09/04/11 0929  . Boceprevir CAPS 800 mg  800 mg Oral Q8H Therisa Doyne, MD   800 mg at 09/04/11 2302  . methadone (DOLOPHINE) tablet 120 mg  120 mg Oral Daily Kelon Easom, MD   120 mg at 09/04/11 1103  . ondansetron (ZOFRAN) injection 4 mg  4 mg Intravenous Q20 Min PRN Ward Givens, MD   4 mg at 09/04/11 2321  . ondansetron (ZOFRAN) injection 4 mg  4 mg Intravenous Q6H PRN Ward Givens, MD      . pantoprazole (PROTONIX) EC tablet 40 mg  40 mg Oral BID WC Casyn Becvar, MD   40 mg at 09/05/11 0644  . piperacillin-tazobactam (ZOSYN) IVPB 3.375 g  3.375 g Intravenous Q8H Tinsley Lomas, MD   3.375 g at 09/05/11 0449  . potassium chloride 20 MEQ/15ML (10%) liquid 40 mEq  40 mEq Oral Once Yarely Bebee, MD      . potassium chloride SA (K-DUR,KLOR-CON) CR tablet 40 mEq  40 mEq Oral Once Therisa Doyne, MD      . QUEtiapine (SEROQUEL) tablet 600 mg  600 mg Oral QHS Therisa Doyne, MD   600 mg at 09/04/11 2303  . ribavirin (REBETOL) capsule 200 mg  200 mg Oral QHS Maela Takeda, MD   200 mg at 09/04/11 2303  . ribavirin (REBETOL)  capsule 400 mg  400 mg Oral Daily Therisa Doyne, MD      . sodium chloride 0.9 % injection 10-40 mL  10-40 mL Intracatheter PRN Lolamae Voisin, MD      . vancomycin (VANCOCIN) IVPB 1000 mg/200 mL premix  1,000 mg Intravenous Q8H Thuy Dien Dang, PHARMD   1,000 mg at 09/05/11 0224  . DISCONTD: methadone (DOLOPHINE) tablet 120 mg  120 mg Oral Daily Angeleigh Chiasson, MD      . DISCONTD: pantoprazole (PROTONIX) injection 40 mg  40 mg Intravenous Q12H Therisa Doyne, MD       Allergies  Allergen Reactions  . Aspirin     Vomits blood   Principal Problem:  *Pneumonia Active Problems:  Thrombocytopenia  Hepatitis C infection  Fatigue  Hyponatremia  Hypokalemia  Anemia   Vital signs in last 24 hours: Temp:  [96.9 F (36.1 C)-99.6 F (37.6 C)] 96.9 F (36.1 C) (04/12 0513) Pulse Rate:  [82-95] 95  (04/12 0513) Resp:  [18-22] 18  (04/12 0513) BP: (128-148)/(62-72) 142/72 mmHg (04/12 0513) SpO2:  [92 %-95 %] 93 % (04/12 0513) Weight change:  Last BM Date: 09/05/11  Intake/Output from previous day: 04/11 0701 - 04/12 0700 In: 0  Out: 550 [Urine:550] Intake/Output this shift:    Lab Results:  Basename 09/05/11 0520 09/04/11 0520  WBC 7.0 5.6  HGB 9.0* 8.2*  HCT 28.7* 25.3*  PLT 27* 30*   BMET  Basename 09/05/11 0520 09/04/11 0520  NA 131* 132*  K 3.7 3.6  CL 94* 98  CO2 27 24  GLUCOSE 122* 89  BUN 7 5*  CREATININE 0.59 0.61  CALCIUM 8.5 7.8*    Studies/Results: Dg Chest 2 View  09/04/2011  *RADIOLOGY REPORT*  Clinical Data: Fever, cough and congestion; body aches.  History of smoking.  CHEST - 2 VIEW  Comparison: Chest radiograph performed 08/12/2011  Findings: The lungs are well-aerated.  Dense retrocardiac and patchy bilateral airspace opacification raises concern for multifocal pneumonia.  There is no evidence of pleural effusion or pneumothorax.  The heart is normal in size; calcification is noted in the aortic arch.  Cervical spinal fusion hardware is  partially imaged.  No acute osseous abnormalities are seen.  IMPRESSION: Dense retrocardiac and patchy bilateral airspace opacities raise concern for multifocal pneumonia.  Original Report Authenticated By: Tonia Ghent, M.D.   Ct Head Wo Contrast  09/04/2011  *RADIOLOGY REPORT*  Clinical Data: Altered mental status  CT HEAD WITHOUT CONTRAST  Technique:  Contiguous axial images were obtained from the base of the skull through the vertex without contrast.  Comparison: 10/20/2010  Findings: Motion degraded images.  No evidence of parenchymal hemorrhage or extra-axial fluid collection. No mass lesion, mass effect, or midline shift.  No CT evidence of acute infarction.  Intracranial atherosclerosis.  Cerebral volume is age appropriate.  No ventriculomegaly.  Mild mucosal thickening in the bilateral sphenoid sinuses.  The mastoid air cells are clear.  No evidence of calvarial fracture.  IMPRESSION: Motion degraded images.  No evidence of acute intracranial abnormality.  Original Report Authenticated By: Charline Bills, M.D.   Dg Chest Port 1 View  09/04/2011  *RADIOLOGY REPORT*  Clinical Data: Line placement.  PORTABLE CHEST - 1 VIEW  Comparison: 09/03/2011.  Findings: Lower cervical ACDF noted.  Right upper extremity PICC is present with the tip in the upper SVC.  Lower lung volumes with more prominent basilar atelectasis.  Basilar opacity is little changed compared to prior allowing for volumes.  IMPRESSION: Interval placement of right upper extremity PICC with the tip in the upper SVC.  Lower lung volumes with basilar atelectasis superimposed on previously seen basilar opacities.  Original Report Authenticated By: Andreas Newport, M.D.    Medications: I have reviewed the patient's current medications.   Physical exam GENERAL- somnolent. HEAD- normal atraumatic, no neck masses, normal thyroid, no jvd RESPIRATORY- appears well, vitals normal, no respiratory distress, acyanotic, normal RR, ear and throat  exam is normal, neck free of mass or lymphadenopathy, chest clear, no wheezing, crepitations, rhonchi, normal symmetric air entry CVS- regular rate and rhythm, S1, S2 normal, no murmur, click, rub or gallop ABDOMEN- abdomen is soft without significant tenderness, masses, organomegaly or guarding NEURO- Grossly normal EXTREMITIES- extremities normal, atraumatic, no cyanosis or edema  Plan  * Pneumonia- multilobar, in immunocompromised. Seems defervescing. Continue current abx coverage, follow septic w/u. * Pancytopenia- hep c related. Transfuse as necessary.  * AMS-ct brain negative. May be meds related. * Hepatitis C infection- on ribavirin per gi  * Hyponatremia/ Hypokalemia- related to cirrhosis. Replenish as necessary.  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  Condition very closely guarded.      Kazuko Clemence 09/05/2011 9:26 AM Pager: [phone].

## 2011-09-05 NOTE — Progress Notes (Signed)
Pt very difficult to arouse. Will open eyes when her name is called. Unsafe to give medications to at present time. Dr. Paged aware of pts current state. Vs stable will continue to monitor.

## 2011-09-06 LAB — BASIC METABOLIC PANEL
BUN: 11 mg/dL (ref 6–23)
CO2: 29 mEq/L (ref 19–32)
GFR calc non Af Amer: 70 mL/min — ABNORMAL LOW (ref >90)
Glucose, Bld: 91 mg/dL (ref 70–99)
Potassium: 3.6 mEq/L (ref 3.5–5.1)

## 2011-09-06 LAB — CBC
HCT: 28.1 % — ABNORMAL LOW (ref 36.0–46.0)
Hemoglobin: 9 g/dL — ABNORMAL LOW (ref 12.0–15.0)
MCHC: 32 g/dL (ref 30.0–36.0)
RBC: 2.74 MIL/uL — ABNORMAL LOW (ref 3.87–5.11)

## 2011-09-06 MED ORDER — ENSURE COMPLETE PO LIQD
237.0000 mL | Freq: Three times a day (TID) | ORAL | Status: DC
  Administered 2011-09-06 – 2011-09-08 (×3): 237 mL via ORAL

## 2011-09-06 MED ORDER — VANCOMYCIN HCL IN DEXTROSE 1-5 GM/200ML-% IV SOLN
1000.0000 mg | Freq: Two times a day (BID) | INTRAVENOUS | Status: DC
  Administered 2011-09-07: 1000 mg via INTRAVENOUS
  Filled 2011-09-06 (×2): qty 200

## 2011-09-06 MED ORDER — QUETIAPINE FUMARATE 300 MG PO TABS
300.0000 mg | ORAL_TABLET | Freq: Every day | ORAL | Status: DC
  Administered 2011-09-06 – 2011-09-07 (×2): 150 mg via ORAL
  Filled 2011-09-06 (×3): qty 1

## 2011-09-06 MED ORDER — LEVOFLOXACIN IN D5W 750 MG/150ML IV SOLN
750.0000 mg | INTRAVENOUS | Status: DC
  Administered 2011-09-06: 750 mg via INTRAVENOUS
  Filled 2011-09-06 (×2): qty 150

## 2011-09-06 NOTE — Progress Notes (Signed)
ANTIBIOTIC CONSULT NOTE - FOLLOW UP  Pharmacy Consult for Vanco/Zosyn  Indication: pneumonia  Allergies  Allergen Reactions  . Aspirin     Vomits blood    Patient Measurements: Height: 5' 6.93" (170 cm) Weight: 178 lb 9.2 oz (81 kg) IBW/kg (Calculated) : 61.44  Adjusted Body Weight:   Vital Signs: Temp: 98.5 F (36.9 C) (04/13 0500) Temp src: Oral (04/13 0500) BP: 150/71 mmHg (04/13 0500) Pulse Rate: 80  (04/13 0500) Intake/Output from previous day: 04/12 0701 - 04/13 0700 In: 120 [P.O.:120] Out: 300 [Urine:300] Intake/Output from this shift:    Labs:  Basename 09/06/11 0500 09/05/11 0520 09/04/11 0750 09/04/11 0520  WBC 5.1 7.0 -- 5.6  HGB 9.0* 9.0* -- 8.2*  PLT 26* 27* -- 30*  LABCREA -- -- 84.20 --  CREATININE 0.94 0.59 -- 0.61   Estimated Creatinine Clearance: 78.2 ml/min (by C-G formula based on Cr of 0.94).  Basename 09/06/11 1300  VANCOTROUGH 22.8*  VANCOPEAK --  Drue Dun --  GENTTROUGH --  GENTPEAK --  GENTRANDOM --  TOBRATROUGH --  TOBRAPEAK --  TOBRARND --  AMIKACINPEAK --  AMIKACINTROU --  AMIKACIN --     Microbiology: Recent Results (from the past 720 hour(s))  URINE CULTURE     Status: Normal   Collection Time   09/03/11  9:37 PM      Component Value Range Status Comment   Specimen Description URINE, CLEAN CATCH   Final    Special Requests NONE   Final    Culture  Setup Time [id number]   Final    Colony Count 10,000 COLONIES/ML   Final    Culture     Final    Value: Multiple bacterial morphotypes present, none predominant. Suggest appropriate recollection if clinically indicated.   Report Status 09/04/2011 FINAL   Final   CULTURE, BLOOD (ROUTINE X 2)     Status: Normal (Preliminary result)   Collection Time   09/03/11 10:02 PM      Component Value Range Status Comment   Specimen Description BLOOD HAND LEFT   Final    Special Requests BOTTLES DRAWN AEROBIC ONLY 2CC   Final    Culture  Setup Time [id number]   Final    Culture      Final    Value:        BLOOD CULTURE RECEIVED NO GROWTH TO DATE CULTURE WILL BE HELD FOR 5 DAYS BEFORE ISSUING A FINAL NEGATIVE REPORT   Report Status PENDING   Incomplete   CULTURE, BLOOD (ROUTINE X 2)     Status: Normal (Preliminary result)   Collection Time   09/03/11 10:17 PM      Component Value Range Status Comment   Specimen Description BLOOD HAND RIGHT   Final    Special Requests BOTTLES DRAWN AEROBIC ONLY 5CC   Final    Culture  Setup Time [id number]   Final    Culture     Final    Value:        BLOOD CULTURE RECEIVED NO GROWTH TO DATE CULTURE WILL BE HELD FOR 5 DAYS BEFORE ISSUING A FINAL NEGATIVE REPORT   Report Status PENDING   Incomplete     Anti-infectives     Start     Dose/Rate Route Frequency Ordered Stop   09/06/11 1000   ribavirin (REBETOL) capsule 400 mg        400 mg Oral Daily 09/05/11 1014     09/05/11 2200   ribavirin (REBETOL) capsule  200 mg        200 mg Oral Daily at bedtime 09/05/11 1014     09/04/11 2200   cefTRIAXone (ROCEPHIN) 1 g in dextrose 5 % 50 mL IVPB  Status:  Discontinued        1 g 100 mL/hr over 30 Minutes Intravenous Daily at bedtime 09/04/11 0355 09/04/11 0737   09/04/11 2200   azithromycin (ZITHROMAX) tablet 500 mg  Status:  Discontinued        500 mg Oral Daily at bedtime 09/04/11 0355 09/04/11 0737   09/04/11 2200   ribavirin (REBETOL) capsule 200 mg  Status:  Discontinued        200 mg Oral Daily at bedtime 09/04/11 0410 09/05/11 1014   09/04/11 1100   vancomycin (VANCOCIN) IVPB 1000 mg/200 mL premix        1,000 mg 200 mL/hr over 60 Minutes Intravenous Every 8 hours 09/04/11 0946     09/04/11 1000   ribavirin (REBETOL) capsule 400 mg  Status:  Discontinued        400 mg Oral Daily 09/04/11 0355 09/05/11 1014   09/04/11 0900   azithromycin (ZITHROMAX) 500 mg in dextrose 5 % 250 mL IVPB        500 mg 250 mL/hr over 60 Minutes Intravenous Every 24 hours 09/04/11 0737 09/06/11 1143   09/04/11 0900  piperacillin-tazobactam  (ZOSYN) IVPB 3.375 g       3.375 g 12.5 mL/hr over 240 Minutes Intravenous Every 8 hours 09/04/11 0739     09/04/11 0745   piperacillin-tazobactam (ZOSYN) IVPB 3.375 g  Status:  Discontinued        3.375 g 100 mL/hr over 30 Minutes Intravenous 3 times per day 09/04/11 0737 09/04/11 0738   09/04/11 0600   Boceprevir CAPS 800 mg        800 mg Oral 3 times per day 09/04/11 0355     09/03/11 2315   cefTRIAXone (ROCEPHIN) 1 g in dextrose 5 % 50 mL IVPB        1 g 100 mL/hr over 30 Minutes Intravenous  Once 09/03/11 2315 09/04/11 0045   09/03/11 2315   azithromycin (ZITHROMAX) 500 mg in dextrose 5 % 250 mL IVPB        500 mg 250 mL/hr over 60 Minutes Intravenous  Once 09/03/11 2315 09/04/11 0208          Assessment: Admit Complaint: 51 yo presenting with weakness/fatigue, feeling poorly for weeks, fever.Pt. immunocompromised d/t treatment for Hep C.  ID: Vanc Rx#34 Zosyn/Azith MD#4 for CAP, Afebrile. WBC down to 5.1, Scr up to 0.94. -CXR shows dense retrocardiac+patchy bilateral airspace opacities indicative of possible multifocal CAP, immunocompromised, -Hx of Hep C infection being tx at [facility] w/ interferon injections+ribavirin+boceprevir. Negative PPD per pt.-Bocepravir 800 tid, ribivirin 400/200 (dose reduced in march d/t compliance issues/AEs) Pegasys q mondays(follow for inpt order and have pt bring in if here Monday) negative ppd, immunizations UTD. -Vanco trough 22.8 >goal  Vanc 1gm q8h 4/11 >> Zosyn 4/11 >> Azith 4/11 >> Rocephin 4/11 > 4/11  4/10 blood cx - NGTD 4/10 urine cx -NEG   Goal of Therapy:  Vancomycin trough level 15-20 mcg/ml  Plan:  Hold Vanco x 1 dose and change to 1g IV q12h.  Hunner Garcon S. Merilynn Finland, PharmD, BCPS Clinical Staff Pharmacist Pager [phone]  09/06/2011,2:15 PM

## 2011-09-06 NOTE — Progress Notes (Signed)
ANTIBIOTIC CONSULT NOTE - Initial Consult Pharmacy Consult for Levaquin  Indication: pneumonia  Allergies  Allergen Reactions  . Aspirin     Vomits blood    Patient Measurements: Height: 5' 6.93" (170 cm) Weight: 178 lb 9.2 oz (81 kg) IBW/kg (Calculated) : 61.44    Vital Signs: Temp: 98.5 F (36.9 C) (04/13 0500) Temp src: Oral (04/13 0500) BP: 150/71 mmHg (04/13 0500) Pulse Rate: 80  (04/13 0500) Intake/Output from previous day: 04/12 0701 - 04/13 0700 In: 120 [P.O.:120] Out: 300 [Urine:300] Intake/Output from this shift:    Labs:  Basename 09/06/11 0500 09/05/11 0520 09/04/11 0750 09/04/11 0520  WBC 5.1 7.0 -- 5.6  HGB 9.0* 9.0* -- 8.2*  PLT 26* 27* -- 30*  LABCREA -- -- 84.20 --  CREATININE 0.94 0.59 -- 0.61   Estimated Creatinine Clearance: 78.2 ml/min (by C-G formula based on Cr of 0.94).  Basename 09/06/11 1300  VANCOTROUGH 22.8*  VANCOPEAK --  Drue Dun --  GENTTROUGH --  GENTPEAK --  GENTRANDOM --  TOBRATROUGH --  TOBRAPEAK --  TOBRARND --  AMIKACINPEAK --  AMIKACINTROU --  AMIKACIN --     Microbiology: Recent Results (from the past 720 hour(s))  URINE CULTURE     Status: Normal   Collection Time   09/03/11  9:37 PM      Component Value Range Status Comment   Specimen Description URINE, CLEAN CATCH   Final    Special Requests NONE   Final    Culture  Setup Time [id number]   Final    Colony Count 10,000 COLONIES/ML   Final    Culture     Final    Value: Multiple bacterial morphotypes present, none predominant. Suggest appropriate recollection if clinically indicated.   Report Status 09/04/2011 FINAL   Final   CULTURE, BLOOD (ROUTINE X 2)     Status: Normal (Preliminary result)   Collection Time   09/03/11 10:02 PM      Component Value Range Status Comment   Specimen Description BLOOD HAND LEFT   Final    Special Requests BOTTLES DRAWN AEROBIC ONLY 2CC   Final    Culture  Setup Time [id number]   Final    Culture     Final    Value:         BLOOD CULTURE RECEIVED NO GROWTH TO DATE CULTURE WILL BE HELD FOR 5 DAYS BEFORE ISSUING A FINAL NEGATIVE REPORT   Report Status PENDING   Incomplete   CULTURE, BLOOD (ROUTINE X 2)     Status: Normal (Preliminary result)   Collection Time   09/03/11 10:17 PM      Component Value Range Status Comment   Specimen Description BLOOD HAND RIGHT   Final    Special Requests BOTTLES DRAWN AEROBIC ONLY 5CC   Final    Culture  Setup Time [id number]   Final    Culture     Final    Value:        BLOOD CULTURE RECEIVED NO GROWTH TO DATE CULTURE WILL BE HELD FOR 5 DAYS BEFORE ISSUING A FINAL NEGATIVE REPORT   Report Status PENDING   Incomplete     Anti-infectives     Start     Dose/Rate Route Frequency Ordered Stop   09/07/11 0230   vancomycin (VANCOCIN) IVPB 1000 mg/200 mL premix        1,000 mg 200 mL/hr over 60 Minutes Intravenous Every 12 hours 09/06/11 1425     09/06/11 1630  levofloxacin (LEVAQUIN) IVPB 750 mg        750 mg 100 mL/hr over 90 Minutes Intravenous Every 24 hours 09/06/11 1519     09/06/11 1000   ribavirin (REBETOL) capsule 400 mg        400 mg Oral Daily 09/05/11 1014     09/05/11 2200   ribavirin (REBETOL) capsule 200 mg        200 mg Oral Daily at bedtime 09/05/11 1014     09/04/11 2200   cefTRIAXone (ROCEPHIN) 1 g in dextrose 5 % 50 mL IVPB  Status:  Discontinued        1 g 100 mL/hr over 30 Minutes Intravenous Daily at bedtime 09/04/11 0355 09/04/11 0737   09/04/11 2200   azithromycin (ZITHROMAX) tablet 500 mg  Status:  Discontinued        500 mg Oral Daily at bedtime 09/04/11 0355 09/04/11 0737   09/04/11 2200   ribavirin (REBETOL) capsule 200 mg  Status:  Discontinued        200 mg Oral Daily at bedtime 09/04/11 0410 09/05/11 1014   09/04/11 1100   vancomycin (VANCOCIN) IVPB 1000 mg/200 mL premix  Status:  Discontinued        1,000 mg 200 mL/hr over 60 Minutes Intravenous Every 8 hours 09/04/11 0946 09/06/11 1425   09/04/11 1000   ribavirin (REBETOL)  capsule 400 mg  Status:  Discontinued        400 mg Oral Daily 09/04/11 0355 09/05/11 1014   09/04/11 0900   azithromycin (ZITHROMAX) 500 mg in dextrose 5 % 250 mL IVPB        500 mg 250 mL/hr over 60 Minutes Intravenous Every 24 hours 09/04/11 0737 09/06/11 1143   09/04/11 0900   piperacillin-tazobactam (ZOSYN) IVPB 3.375 g  Status:  Discontinued        3.375 g 12.5 mL/hr over 240 Minutes Intravenous Every 8 hours 09/04/11 0739 09/06/11 1450   09/04/11 0745   piperacillin-tazobactam (ZOSYN) IVPB 3.375 g  Status:  Discontinued        3.375 g 100 mL/hr over 30 Minutes Intravenous 3 times per day 09/04/11 0737 09/04/11 0738   09/04/11 0600   Boceprevir CAPS 800 mg        800 mg Oral 3 times per day 09/04/11 0355     09/03/11 2315   cefTRIAXone (ROCEPHIN) 1 g in dextrose 5 % 50 mL IVPB        1 g 100 mL/hr over 30 Minutes Intravenous  Once 09/03/11 2315 09/04/11 0045   09/03/11 2315   azithromycin (ZITHROMAX) 500 mg in dextrose 5 % 250 mL IVPB        500 mg 250 mL/hr over 60 Minutes Intravenous  Once 09/03/11 2315 09/04/11 0208          Assessment: PNA (CAP).  Afebrile for more than 48 hours.  Continuing IV Vancomycin D#3.  MD has discontiuned the Zosyn and Azithromycin. Changing to Levaquin.   CrCl ~ 56ml/min but SCr increased from 0.59 to 0.94 today. No adjustment needed for liver dysfunction.   Vanc 4/11 >> Zosyn 4/11 >>DC'd 4/13 Azith 4/11 >>DC'd 4/13 Rocephin 4/11 > 4/11  4/10 blood CX x 2 - NGTD 4/10 urine cx -NEG   Goal of Therapy:  Vancomycin trough level 15-20 mcg/ml  Plan:  Levaquin 750 mg IV q24hr  Noah Delaine, RPh 09/06/2011,3:19 PM

## 2011-09-06 NOTE — Progress Notes (Signed)
Pt. Refused to take full dose of seroquel 600mg . She only took 150mg  . She refused to take the other 450mg  of seroquel.  Kamiyah Kindel E

## 2011-09-06 NOTE — Progress Notes (Addendum)
PATIENT DETAILS Name: [name] Age: 51 y.o. Sex: female Date of Birth: 1960-05-28 Admit Date: 09/03/2011 ZOX:[id number] M, MD, MD  Subjective: Still feels weak, but better than what she came in with. "The Hep C medications are messing with me Doc"  Objective: Vital signs in last 24 hours: Filed Vitals:   09/05/11 0513 09/05/11 1300 09/05/11 2255 09/06/11 0500  BP: 142/72 138/66 111/66 150/71  Pulse: 95 93 63 80  Temp: 96.9 F (36.1 C) 97.4 F (36.3 C) 98.6 F (37 C) 98.5 F (36.9 C)  TempSrc: Oral Oral Oral Oral  Resp: 18 16 20 20   Height:      Weight:      SpO2: 93% 93% 97% 97%    Weight change:   Body mass index is 28.03 kg/(m^2).  Intake/Output from previous day:  Intake/Output Summary (Last 24 hours) at 09/06/11 1446 Last data filed at 09/06/11 0900  Gross per 24 hour  Intake      0 ml  Output      0 ml  Net      0 ml    PHYSICAL EXAM: Gen Exam: Awake and alert with clear speech. Neck: Supple, No JVD.  Chest: B/L Clear.   CVS: S1 S2 Regular, no murmurs.  Abdomen: soft, BS +, non tender, non distended.  Extremities: no edema, lower extremities warm to touch. Neurologic: Non Focal.   Skin: No Rash.   Wounds: N/A.    CONSULTS:  None  LAB RESULTS: CBC  Lab 09/06/11 0500 09/05/11 0520 09/04/11 0520 09/03/11 2201  WBC 5.1 7.0 5.6 5.6  HGB 9.0* 9.0* 8.2* 6.9*  HCT 28.1* 28.7* 25.3* 22.0*  PLT 26* 27* 30* 36*  MCV 102.6* 102.9* 102.0* 107.3*  MCH 32.8 32.3 33.1 33.7  MCHC 32.0 31.4 32.4 31.4  RDW 18.3* 19.5* 19.8* 17.2*  LYMPHSABS -- -- 1.2 0.7  MONOABS -- -- 0.1 0.4  EOSABS -- -- 0.0 0.0  BASOSABS -- -- 0.0 0.0  BANDABS -- -- -- --    Chemistries   Lab 09/06/11 0500 09/05/11 0520 09/04/11 0520 09/03/11 2201  NA 134* 131* 132* 132*  K 3.6 3.7 3.6 3.4*  CL 96 94* 98 98  CO2 29 27 24 24   GLUCOSE 91 122* 89 97  BUN 11 7 5* 6  CREATININE 0.94 0.59 0.61 0.66  CALCIUM 8.9 8.5 7.8* 8.6  MG -- 1.9 -- --    GFR Estimated  Creatinine Clearance: 78.2 ml/min (by C-G formula based on Cr of 0.94).  Coagulation profile  Lab 09/03/11 2201  INR 1.14  PROTIME --    Cardiac Enzymes No results found for this basename: CK:3,CKMB:3,TROPONINI:3,MYOGLOBIN:3 in the last 168 hours  No components found with this basename: POCBNP:3 No results found for this basename: DDIMER:2 in the last 72 hours No results found for this basename: HGBA1C:2 in the last 72 hours No results found for this basename: CHOL:2,HDL:2,LDLCALC:2,TRIG:2,CHOLHDL:2,LDLDIRECT:2 in the last 72 hours No results found for this basename: TSH,T4TOTAL,FREET3,T3FREE,THYROIDAB in the last 72 hours  Basename 09/04/11 0520  VITAMINB12 521  FOLATE >20.0  FERRITIN 435*  TIBC 225*  IRON 26*  RETICCTPCT 2.3   No results found for this basename: LIPASE:2,AMYLASE:2 in the last 72 hours  Urine Studies No results found for this basename: UACOL:2,UAPR:2,USPG:2,UPH:2,UTP:2,UGL:2,UKET:2,UBIL:2,UHGB:2,UNIT:2,UROB:2,ULEU:2,UEPI:2,UWBC:2,URBC:2,UBAC:2,CAST:2,CRYS:2,UCOM:2,BILUA:2 in the last 72 hours  MICROBIOLOGY: Recent Results (from the past 240 hour(s))  URINE CULTURE     Status: Normal   Collection Time   09/03/11  9:37 PM  Component Value Range Status Comment   Specimen Description URINE, CLEAN CATCH   Final    Special Requests NONE   Final    Culture  Setup Time [id number]   Final    Colony Count 10,000 COLONIES/ML   Final    Culture     Final    Value: Multiple bacterial morphotypes present, none predominant. Suggest appropriate recollection if clinically indicated.   Report Status 09/04/2011 FINAL   Final   CULTURE, BLOOD (ROUTINE X 2)     Status: Normal (Preliminary result)   Collection Time   09/03/11 10:02 PM      Component Value Range Status Comment   Specimen Description BLOOD HAND LEFT   Final    Special Requests BOTTLES DRAWN AEROBIC ONLY 2CC   Final    Culture  Setup Time [id number]   Final    Culture     Final    Value:         BLOOD CULTURE RECEIVED NO GROWTH TO DATE CULTURE WILL BE HELD FOR 5 DAYS BEFORE ISSUING A FINAL NEGATIVE REPORT   Report Status PENDING   Incomplete   CULTURE, BLOOD (ROUTINE X 2)     Status: Normal (Preliminary result)   Collection Time   09/03/11 10:17 PM      Component Value Range Status Comment   Specimen Description BLOOD HAND RIGHT   Final    Special Requests BOTTLES DRAWN AEROBIC ONLY 5CC   Final    Culture  Setup Time [id number]   Final    Culture     Final    Value:        BLOOD CULTURE RECEIVED NO GROWTH TO DATE CULTURE WILL BE HELD FOR 5 DAYS BEFORE ISSUING A FINAL NEGATIVE REPORT   Report Status PENDING   Incomplete     RADIOLOGY STUDIES/RESULTS: Dg Chest 1 View  08/12/2011  *RADIOLOGY REPORT*  Clinical Data: Positive PPD, history of liver disease  CHEST - 1 VIEW  Comparison: Chest x-ray of 06/05/2011  Findings: The lungs are clear.  The heart is within upper limits of normal.  The retrocardiac opacity probably represents a small hiatal hernia.  A lower anterior cervical spine fusion plate is present.  IMPRESSION: No active lung disease.  Probable small hiatal hernia.  Original Report Authenticated By: Juline Patch, M.D.   Dg Chest 2 View  09/04/2011  *RADIOLOGY REPORT*  Clinical Data: Fever, cough and congestion; body aches.  History of smoking.  CHEST - 2 VIEW  Comparison: Chest radiograph performed 08/12/2011  Findings: The lungs are well-aerated.  Dense retrocardiac and patchy bilateral airspace opacification raises concern for multifocal pneumonia.  There is no evidence of pleural effusion or pneumothorax.  The heart is normal in size; calcification is noted in the aortic arch.  Cervical spinal fusion hardware is partially imaged.  No acute osseous abnormalities are seen.  IMPRESSION: Dense retrocardiac and patchy bilateral airspace opacities raise concern for multifocal pneumonia.  Original Report Authenticated By: Tonia Ghent, M.D.   Ct Head Wo Contrast  09/04/2011   *RADIOLOGY REPORT*  Clinical Data: Altered mental status  CT HEAD WITHOUT CONTRAST  Technique:  Contiguous axial images were obtained from the base of the skull through the vertex without contrast.  Comparison: 10/20/2010  Findings: Motion degraded images.  No evidence of parenchymal hemorrhage or extra-axial fluid collection. No mass lesion, mass effect, or midline shift.  No CT evidence of acute infarction.  Intracranial atherosclerosis.  Cerebral volume is age  appropriate.  No ventriculomegaly.  Mild mucosal thickening in the bilateral sphenoid sinuses.  The mastoid air cells are clear.  No evidence of calvarial fracture.  IMPRESSION: Motion degraded images.  No evidence of acute intracranial abnormality.  Original Report Authenticated By: Charline Bills, M.D.   US Abdomen Complete  08/25/2011  *RADIOLOGY REPORT*  Clinical Data:  Chronic hepatitis C, cirrhosis  COMPLETE ABDOMINAL ULTRASOUND  Comparison:  CT scan 12/15/2010  Findings:  Gallbladder:  Surgically absent  Common bile duct:  Measures 10.8 mm in diameter mild prominent size  Liver:    Within normal limits in parenchymal echogenicity.There is focal fatty infiltration in the porta hepatic region.  No intrahepatic biliary ductal dilatation.  IVC:  Appears normal.  Pancreas:  No focal abnormality seen.  Spleen:  Measures 12.6 cm and borderline enlarged.  Normal echogenicity.  Right Kidney:  Measures 10 cm in length.  No hydronephrosis, mass or diagnostic renal calculus  Left Kidney:  Measures 10 cm in length.  No hydronephrosis, mass or diagnostic renal calculus  Abdominal aorta:  No aneurysm identified. Measures up to 1.6 cm in diameter.  IMPRESSION: 1.  Surgically absent gallbladder.  Mild prominent in size CBD measures 10.8 mm in diameter. 2.  No focal hepatic mass.  Mild focal fatty infiltration in the porta hepatis region. 3.  No hydronephrosis or diagnostic renal calculus. 4.  Borderline splenomegaly.  Original Report Authenticated By: Natasha Mead,  M.D.   Dg Chest Port 1 View  09/04/2011  *RADIOLOGY REPORT*  Clinical Data: Line placement.  PORTABLE CHEST - 1 VIEW  Comparison: 09/03/2011.  Findings: Lower cervical ACDF noted.  Right upper extremity PICC is present with the tip in the upper SVC.  Lower lung volumes with more prominent basilar atelectasis.  Basilar opacity is little changed compared to prior allowing for volumes.  IMPRESSION: Interval placement of right upper extremity PICC with the tip in the upper SVC.  Lower lung volumes with basilar atelectasis superimposed on previously seen basilar opacities.  Original Report Authenticated By: Andreas Newport, M.D.    MEDICATIONS: Scheduled Meds:   . acetaminophen  650 mg Oral Once  . amphetamine-dextroamphetamine  20 mg Oral BID WC  . azithromycin  500 mg Intravenous Q24H  . Boceprevir  800 mg Oral Q8H  . feeding supplement  237 mL Oral TID WC  . methadone  110 mg Oral Daily  . pantoprazole  40 mg Oral BID WC  . piperacillin-tazobactam (ZOSYN)  IV  3.375 g Intravenous Q8H  . potassium chloride  40 mEq Oral Once  . potassium chloride  40 mEq Oral Once  . QUEtiapine  600 mg Oral QHS  . ribavirin  200 mg Oral QHS  . ribavirin  400 mg Oral Daily  . vancomycin  1,000 mg Intravenous Q12H  . DISCONTD: vancomycin  1,000 mg Intravenous Q8H   Continuous Infusions:   . sodium chloride 20 mL/hr (09/06/11 0609)   PRN Meds:.ondansetron (ZOFRAN) IV, sodium chloride  Antibiotics: Anti-infectives     Start     Dose/Rate Route Frequency Ordered Stop   09/07/11 0230   vancomycin (VANCOCIN) IVPB 1000 mg/200 mL premix        1,000 mg 200 mL/hr over 60 Minutes Intravenous Every 12 hours 09/06/11 1425     09/06/11 1000   ribavirin (REBETOL) capsule 400 mg        400 mg Oral Daily 09/05/11 1014     09/05/11 2200   ribavirin (REBETOL) capsule 200 mg  200 mg Oral Daily at bedtime 09/05/11 1014     09/04/11 2200   cefTRIAXone (ROCEPHIN) 1 g in dextrose 5 % 50 mL IVPB  Status:   Discontinued        1 g 100 mL/hr over 30 Minutes Intravenous Daily at bedtime 09/04/11 0355 09/04/11 0737   09/04/11 2200   azithromycin (ZITHROMAX) tablet 500 mg  Status:  Discontinued        500 mg Oral Daily at bedtime 09/04/11 0355 09/04/11 0737   09/04/11 2200   ribavirin (REBETOL) capsule 200 mg  Status:  Discontinued        200 mg Oral Daily at bedtime 09/04/11 0410 09/05/11 1014   09/04/11 1100   vancomycin (VANCOCIN) IVPB 1000 mg/200 mL premix  Status:  Discontinued        1,000 mg 200 mL/hr over 60 Minutes Intravenous Every 8 hours 09/04/11 0946 09/06/11 1425   09/04/11 1000   ribavirin (REBETOL) capsule 400 mg  Status:  Discontinued        400 mg Oral Daily 09/04/11 0355 09/05/11 1014   09/04/11 0900   azithromycin (ZITHROMAX) 500 mg in dextrose 5 % 250 mL IVPB        500 mg 250 mL/hr over 60 Minutes Intravenous Every 24 hours 09/04/11 0737 09/06/11 1143   09/04/11 0900  piperacillin-tazobactam (ZOSYN) IVPB 3.375 g       3.375 g 12.5 mL/hr over 240 Minutes Intravenous Every 8 hours 09/04/11 0739     09/04/11 0745   piperacillin-tazobactam (ZOSYN) IVPB 3.375 g  Status:  Discontinued        3.375 g 100 mL/hr over 30 Minutes Intravenous 3 times per day 09/04/11 0737 09/04/11 0738   09/04/11 0600   Boceprevir CAPS 800 mg        800 mg Oral 3 times per day 09/04/11 0355     09/03/11 2315   cefTRIAXone (ROCEPHIN) 1 g in dextrose 5 % 50 mL IVPB        1 g 100 mL/hr over 30 Minutes Intravenous  Once 09/03/11 2315 09/04/11 0045   09/03/11 2315   azithromycin (ZITHROMAX) 500 mg in dextrose 5 % 250 mL IVPB        500 mg 250 mL/hr over 60 Minutes Intravenous  Once 09/03/11 2315 09/04/11 0208          Assessment/Plan: Patient Active Hospital Problem List: Pneumonia -Afebrile for more than 48 hours now -Continue with Vancomycin, change Zosyn to Levaquin 750 mg -Encourage patient to ambulate and eat, will follow clinical course    Thrombocytopenia  -chronic from Hep  C/Cirrhosis, but ?worsened by Ribavirin therapy -no signs of bleeding -will need to monitor closely   Hepatitis C infection -is getting treatment with Ribavirin, Boceprevir and Pegasys -currently claims she cannot tolerate these meds-and is not motivated to continue on them, I have advised her to see her Hepatologist Dr Timothy Lasso at the next appointment, and take up the matter with him. She seems agreeable to this  Anemia -chronic, no signs of bleeding or hemolysis -is S/P 2 units of PRBC transfusion this admit, monitor H/H this admit  Fatigue -?related to depression (since on Anti Hep C meds) vs meds -will get PT eval -Psych eval either this admit or as outpatient  Depression -see's Psych as outpatient -takes 300mg  of Seroquel and not 600mg , will decrease to 300 mg -psych eval as needed, but currently not suicidal or homicidal  Chronic Pain Syndrome -continue with Methadone  Hyponatremia -mild -monitor periodically  Disposition: Remain inpatient  DVT Prophylaxis: B/L SCD's given thrombocytopenia  Code Status: Full Code  Maretta Bees, MD. 09/06/2011, 2:46 PM  Addendum  -Spoke with Dr Susanne Borders Fellow on call at [facility], labs and current meds discussed in detail, advised to continue with current management.

## 2011-09-07 DIAGNOSIS — F1120 Opioid dependence, uncomplicated: Secondary | ICD-10-CM

## 2011-09-07 LAB — CBC
MCHC: 31.4 g/dL (ref 30.0–36.0)
Platelets: 25 10*3/uL — CL (ref 150–400)
RDW: 17.6 % — ABNORMAL HIGH (ref 11.5–15.5)
WBC: 2.8 10*3/uL — ABNORMAL LOW (ref 4.0–10.5)

## 2011-09-07 MED ORDER — LEVOFLOXACIN IN D5W 250 MG/50ML IV SOLN
250.0000 mg | INTRAVENOUS | Status: DC
  Administered 2011-09-07: 250 mg via INTRAVENOUS
  Filled 2011-09-07 (×2): qty 50

## 2011-09-07 MED ORDER — SODIUM CHLORIDE 0.9 % IV SOLN
750.0000 mg | Freq: Two times a day (BID) | INTRAVENOUS | Status: DC
  Administered 2011-09-07 – 2011-09-08 (×2): 750 mg via INTRAVENOUS
  Filled 2011-09-07 (×4): qty 750

## 2011-09-07 NOTE — Progress Notes (Signed)
ANTIBIOTIC CONSULT NOTE - Initial Consult Pharmacy Consult for Levaquin  Indication: pneumonia  Allergies  Allergen Reactions  . Aspirin     Vomits blood    Patient Measurements: Height: 5' 6.93" (170 cm) Weight: 178 lb 9.2 oz (81 kg) IBW/kg (Calculated) : 61.44    Vital Signs: Temp: 98.2 F (36.8 C) (04/14 0525) Temp src: Oral (04/14 0525) BP: 131/73 mmHg (04/14 0525) Pulse Rate: 78  (04/14 0525) Intake/Output from previous day: 04/13 0701 - 04/14 0700 In: 827 [I.V.:477; IV Piggyback:350] Out: -  Intake/Output from this shift:    Labs:  Basename 09/07/11 0658 09/06/11 0500 09/05/11 0520  WBC 2.8* 5.1 7.0  HGB 8.9* 9.0* 9.0*  PLT 25* 26* 27*  LABCREA -- -- --  CREATININE 1.47* 0.94 0.59   Estimated Creatinine Clearance: 50 ml/min (by C-G formula based on Cr of 1.47).  Basename 09/06/11 1300  VANCOTROUGH 22.8*  VANCOPEAK --  Drue Dun --  GENTTROUGH --  GENTPEAK --  GENTRANDOM --  TOBRATROUGH --  TOBRAPEAK --  TOBRARND --  AMIKACINPEAK --  AMIKACINTROU --  AMIKACIN --     Microbiology: Recent Results (from the past 720 hour(s))  URINE CULTURE     Status: Normal   Collection Time   09/03/11  9:37 PM      Component Value Range Status Comment   Specimen Description URINE, CLEAN CATCH   Final    Special Requests NONE   Final    Culture  Setup Time [id number]   Final    Colony Count 10,000 COLONIES/ML   Final    Culture     Final    Value: Multiple bacterial morphotypes present, none predominant. Suggest appropriate recollection if clinically indicated.   Report Status 09/04/2011 FINAL   Final   CULTURE, BLOOD (ROUTINE X 2)     Status: Normal (Preliminary result)   Collection Time   09/03/11 10:02 PM      Component Value Range Status Comment   Specimen Description BLOOD HAND LEFT   Final    Special Requests BOTTLES DRAWN AEROBIC ONLY 2CC   Final    Culture  Setup Time [id number]   Final    Culture     Final    Value:        BLOOD CULTURE  RECEIVED NO GROWTH TO DATE CULTURE WILL BE HELD FOR 5 DAYS BEFORE ISSUING A FINAL NEGATIVE REPORT   Report Status PENDING   Incomplete   CULTURE, BLOOD (ROUTINE X 2)     Status: Normal (Preliminary result)   Collection Time   09/03/11 10:17 PM      Component Value Range Status Comment   Specimen Description BLOOD HAND RIGHT   Final    Special Requests BOTTLES DRAWN AEROBIC ONLY 5CC   Final    Culture  Setup Time [id number]   Final    Culture     Final    Value:        BLOOD CULTURE RECEIVED NO GROWTH TO DATE CULTURE WILL BE HELD FOR 5 DAYS BEFORE ISSUING A FINAL NEGATIVE REPORT   Report Status PENDING   Incomplete     Anti-infectives     Start     Dose/Rate Route Frequency Ordered Stop   09/07/11 1800   vancomycin (VANCOCIN) 750 mg in sodium chloride 0.9 % 150 mL IVPB        750 mg 150 mL/hr over 60 Minutes Intravenous Every 12 hours 09/07/11 1234     09/07/11 1630  Levofloxacin (LEVAQUIN) IVPB 250 mg        250 mg 50 mL/hr over 60 Minutes Intravenous Every 24 hours 09/07/11 1235     09/07/11 0230   vancomycin (VANCOCIN) IVPB 1000 mg/200 mL premix  Status:  Discontinued        1,000 mg 200 mL/hr over 60 Minutes Intravenous Every 12 hours 09/06/11 1425 09/07/11 1235   09/06/11 1630   levofloxacin (LEVAQUIN) IVPB 750 mg  Status:  Discontinued        750 mg 100 mL/hr over 90 Minutes Intravenous Every 24 hours 09/06/11 1519 09/07/11 1236   09/06/11 1000   ribavirin (REBETOL) capsule 400 mg        400 mg Oral Daily 09/05/11 1014     09/05/11 2200   ribavirin (REBETOL) capsule 200 mg        200 mg Oral Daily at bedtime 09/05/11 1014     09/04/11 2200   cefTRIAXone (ROCEPHIN) 1 g in dextrose 5 % 50 mL IVPB  Status:  Discontinued        1 g 100 mL/hr over 30 Minutes Intravenous Daily at bedtime 09/04/11 0355 09/04/11 0737   09/04/11 2200   azithromycin (ZITHROMAX) tablet 500 mg  Status:  Discontinued        500 mg Oral Daily at bedtime 09/04/11 0355 09/04/11 0737   09/04/11  2200   ribavirin (REBETOL) capsule 200 mg  Status:  Discontinued        200 mg Oral Daily at bedtime 09/04/11 0410 09/05/11 1014   09/04/11 1100   vancomycin (VANCOCIN) IVPB 1000 mg/200 mL premix  Status:  Discontinued        1,000 mg 200 mL/hr over 60 Minutes Intravenous Every 8 hours 09/04/11 0946 09/06/11 1425   09/04/11 1000   ribavirin (REBETOL) capsule 400 mg  Status:  Discontinued        400 mg Oral Daily 09/04/11 0355 09/05/11 1014   09/04/11 0900   azithromycin (ZITHROMAX) 500 mg in dextrose 5 % 250 mL IVPB        500 mg 250 mL/hr over 60 Minutes Intravenous Every 24 hours 09/04/11 0737 09/06/11 1143   09/04/11 0900   piperacillin-tazobactam (ZOSYN) IVPB 3.375 g  Status:  Discontinued        3.375 g 12.5 mL/hr over 240 Minutes Intravenous Every 8 hours 09/04/11 0739 09/06/11 1450   09/04/11 0745   piperacillin-tazobactam (ZOSYN) IVPB 3.375 g  Status:  Discontinued        3.375 g 100 mL/hr over 30 Minutes Intravenous 3 times per day 09/04/11 0737 09/04/11 0738   09/04/11 0600   Boceprevir CAPS 800 mg        800 mg Oral 3 times per day 09/04/11 0355     09/03/11 2315   cefTRIAXone (ROCEPHIN) 1 g in dextrose 5 % 50 mL IVPB        1 g 100 mL/hr over 30 Minutes Intravenous  Once 09/03/11 2315 09/04/11 0045   09/03/11 2315   azithromycin (ZITHROMAX) 500 mg in dextrose 5 % 250 mL IVPB        500 mg 250 mL/hr over 60 Minutes Intravenous  Once 09/03/11 2315 09/04/11 0208          Assessment: Vanc Rx#4 /levaquin -Rx D#2 (D4 ABX) for CAP, Afeb. WBC down to 2.8 <-- 5.1, Scr up 1.47 (<--0.94<-- 0.59). Estimated CrCl decreased to ~38ml/min in this 51 y.o. Immunocompromised female due to treatment for Hepatitis C  infection. Afebrile. 09/03/11 blood cultures x2 No growth to date. Urine cx 4/10 was negative.  Will adjust Levaquin and vancomycin doses for reduced renal function.   Goal of Therapy:  Vancomycin trough level 15-20 mcg/ml  Plan:  Reduce Levaquin to 250 mg IV q24h  and reduce Vancomycin to 750mg  IV q12h (CrCL ~27ml/min, wt 81kg).  Monitor steady state vancomycin trough level early this week.  Noah Delaine, RPh 09/07/2011 12:38PM

## 2011-09-07 NOTE — Progress Notes (Signed)
PATIENT DETAILS Name: [name] Age: 51 y.o. Sex: female Date of Birth: 1960/07/25 Admit Date: 09/03/2011 ZOX:[id number] M, MD, MD  Subjective: Continues to feel weak Refused Boceprevir and Ribavirin this am  Objective: Vital signs in last 24 hours: Filed Vitals:   09/06/11 0500 09/06/11 1500 09/06/11 2200 09/07/11 0525  BP: 150/71 118/67 116/64 131/73  Pulse: 80 65 72 78  Temp: 98.5 F (36.9 C) 97.6 F (36.4 C) 98.2 F (36.8 C) 98.2 F (36.8 C)  TempSrc: Oral Axillary Oral Oral  Resp: 20 20 18 20   Height:      Weight:      SpO2: 97% 95% 96% 92%    Weight change:   Body mass index is 28.03 kg/(m^2).  Intake/Output from previous day:  Intake/Output Summary (Last 24 hours) at 09/07/11 1422 Last data filed at 09/07/11 0600  Gross per 24 hour  Intake    827 ml  Output      0 ml  Net    827 ml    PHYSICAL EXAM: Gen Exam: Awake and alert with clear speech. Neck: Supple, No JVD.  Chest: B/L Clear.   CVS: S1 S2 Regular, no murmurs.  Abdomen: soft, BS +, non tender, non distended.  Extremities: no edema, lower extremities warm to touch. Neurologic: Non Focal.   Skin: No Rash.   Wounds: N/A.    CONSULTS:  None  LAB RESULTS: CBC  Lab 09/07/11 0658 09/06/11 0500 09/05/11 0520 09/04/11 0520 09/03/11 2201  WBC 2.8* 5.1 7.0 5.6 5.6  HGB 8.9* 9.0* 9.0* 8.2* 6.9*  HCT 28.3* 28.1* 28.7* 25.3* 22.0*  PLT 25* 26* 27* 30* 36*  MCV 102.2* 102.6* 102.9* 102.0* 107.3*  MCH 32.1 32.8 32.3 33.1 33.7  MCHC 31.4 32.0 31.4 32.4 31.4  RDW 17.6* 18.3* 19.5* 19.8* 17.2*  LYMPHSABS -- -- -- 1.2 0.7  MONOABS -- -- -- 0.1 0.4  EOSABS -- -- -- 0.0 0.0  BASOSABS -- -- -- 0.0 0.0  BANDABS -- -- -- -- --    Chemistries   Lab 09/07/11 0658 09/06/11 0500 09/05/11 0520 09/04/11 0520 09/03/11 2201  NA 137 134* 131* 132* 132*  K 3.4* 3.6 3.7 3.6 3.4*  CL 100 96 94* 98 98  CO2 29 29 27 24 24   GLUCOSE 74 91 122* 89 97  BUN 13 11 7  5* 6  CREATININE 1.47* 0.94 0.59 0.61  0.66  CALCIUM 9.0 8.9 8.5 7.8* 8.6  MG -- -- 1.9 -- --    GFR Estimated Creatinine Clearance: 50 ml/min (by C-G formula based on Cr of 1.47).  Coagulation profile  Lab 09/03/11 2201  INR 1.14  PROTIME --    Cardiac Enzymes No results found for this basename: CK:3,CKMB:3,TROPONINI:3,MYOGLOBIN:3 in the last 168 hours  No components found with this basename: POCBNP:3 No results found for this basename: DDIMER:2 in the last 72 hours No results found for this basename: HGBA1C:2 in the last 72 hours No results found for this basename: CHOL:2,HDL:2,LDLCALC:2,TRIG:2,CHOLHDL:2,LDLDIRECT:2 in the last 72 hours No results found for this basename: TSH,T4TOTAL,FREET3,T3FREE,THYROIDAB in the last 72 hours No results found for this basename: VITAMINB12:2,FOLATE:2,FERRITIN:2,TIBC:2,IRON:2,RETICCTPCT:2 in the last 72 hours No results found for this basename: LIPASE:2,AMYLASE:2 in the last 72 hours  Urine Studies No results found for this basename: UACOL:2,UAPR:2,USPG:2,UPH:2,UTP:2,UGL:2,UKET:2,UBIL:2,UHGB:2,UNIT:2,UROB:2,ULEU:2,UEPI:2,UWBC:2,URBC:2,UBAC:2,CAST:2,CRYS:2,UCOM:2,BILUA:2 in the last 72 hours  MICROBIOLOGY: Recent Results (from the past 240 hour(s))  URINE CULTURE     Status: Normal   Collection Time   09/03/11  9:37 PM  Component Value Range Status Comment   Specimen Description URINE, CLEAN CATCH   Final    Special Requests NONE   Final    Culture  Setup Time [id number]   Final    Colony Count 10,000 COLONIES/ML   Final    Culture     Final    Value: Multiple bacterial morphotypes present, none predominant. Suggest appropriate recollection if clinically indicated.   Report Status 09/04/2011 FINAL   Final   CULTURE, BLOOD (ROUTINE X 2)     Status: Normal (Preliminary result)   Collection Time   09/03/11 10:02 PM      Component Value Range Status Comment   Specimen Description BLOOD HAND LEFT   Final    Special Requests BOTTLES DRAWN AEROBIC ONLY 2CC   Final    Culture   Setup Time [id number]   Final    Culture     Final    Value:        BLOOD CULTURE RECEIVED NO GROWTH TO DATE CULTURE WILL BE HELD FOR 5 DAYS BEFORE ISSUING A FINAL NEGATIVE REPORT   Report Status PENDING   Incomplete   CULTURE, BLOOD (ROUTINE X 2)     Status: Normal (Preliminary result)   Collection Time   09/03/11 10:17 PM      Component Value Range Status Comment   Specimen Description BLOOD HAND RIGHT   Final    Special Requests BOTTLES DRAWN AEROBIC ONLY 5CC   Final    Culture  Setup Time [id number]   Final    Culture     Final    Value:        BLOOD CULTURE RECEIVED NO GROWTH TO DATE CULTURE WILL BE HELD FOR 5 DAYS BEFORE ISSUING A FINAL NEGATIVE REPORT   Report Status PENDING   Incomplete     RADIOLOGY STUDIES/RESULTS: Dg Chest 1 View  08/12/2011  *RADIOLOGY REPORT*  Clinical Data: Positive PPD, history of liver disease  CHEST - 1 VIEW  Comparison: Chest x-ray of 06/05/2011  Findings: The lungs are clear.  The heart is within upper limits of normal.  The retrocardiac opacity probably represents a small hiatal hernia.  A lower anterior cervical spine fusion plate is present.  IMPRESSION: No active lung disease.  Probable small hiatal hernia.  Original Report Authenticated By: Juline Patch, M.D.   Dg Chest 2 View  09/04/2011  *RADIOLOGY REPORT*  Clinical Data: Fever, cough and congestion; body aches.  History of smoking.  CHEST - 2 VIEW  Comparison: Chest radiograph performed 08/12/2011  Findings: The lungs are well-aerated.  Dense retrocardiac and patchy bilateral airspace opacification raises concern for multifocal pneumonia.  There is no evidence of pleural effusion or pneumothorax.  The heart is normal in size; calcification is noted in the aortic arch.  Cervical spinal fusion hardware is partially imaged.  No acute osseous abnormalities are seen.  IMPRESSION: Dense retrocardiac and patchy bilateral airspace opacities raise concern for multifocal pneumonia.  Original Report  Authenticated By: Tonia Ghent, M.D.   Ct Head Wo Contrast  09/04/2011  *RADIOLOGY REPORT*  Clinical Data: Altered mental status  CT HEAD WITHOUT CONTRAST  Technique:  Contiguous axial images were obtained from the base of the skull through the vertex without contrast.  Comparison: 10/20/2010  Findings: Motion degraded images.  No evidence of parenchymal hemorrhage or extra-axial fluid collection. No mass lesion, mass effect, or midline shift.  No CT evidence of acute infarction.  Intracranial atherosclerosis.  Cerebral volume is age  appropriate.  No ventriculomegaly.  Mild mucosal thickening in the bilateral sphenoid sinuses.  The mastoid air cells are clear.  No evidence of calvarial fracture.  IMPRESSION: Motion degraded images.  No evidence of acute intracranial abnormality.  Original Report Authenticated By: Charline Bills, M.D.   US Abdomen Complete  08/25/2011  *RADIOLOGY REPORT*  Clinical Data:  Chronic hepatitis C, cirrhosis  COMPLETE ABDOMINAL ULTRASOUND  Comparison:  CT scan 12/15/2010  Findings:  Gallbladder:  Surgically absent  Common bile duct:  Measures 10.8 mm in diameter mild prominent size  Liver:    Within normal limits in parenchymal echogenicity.There is focal fatty infiltration in the porta hepatic region.  No intrahepatic biliary ductal dilatation.  IVC:  Appears normal.  Pancreas:  No focal abnormality seen.  Spleen:  Measures 12.6 cm and borderline enlarged.  Normal echogenicity.  Right Kidney:  Measures 10 cm in length.  No hydronephrosis, mass or diagnostic renal calculus  Left Kidney:  Measures 10 cm in length.  No hydronephrosis, mass or diagnostic renal calculus  Abdominal aorta:  No aneurysm identified. Measures up to 1.6 cm in diameter.  IMPRESSION: 1.  Surgically absent gallbladder.  Mild prominent in size CBD measures 10.8 mm in diameter. 2.  No focal hepatic mass.  Mild focal fatty infiltration in the porta hepatis region. 3.  No hydronephrosis or diagnostic renal calculus.  4.  Borderline splenomegaly.  Original Report Authenticated By: Natasha Mead, M.D.   Dg Chest Port 1 View  09/04/2011  *RADIOLOGY REPORT*  Clinical Data: Line placement.  PORTABLE CHEST - 1 VIEW  Comparison: 09/03/2011.  Findings: Lower cervical ACDF noted.  Right upper extremity PICC is present with the tip in the upper SVC.  Lower lung volumes with more prominent basilar atelectasis.  Basilar opacity is little changed compared to prior allowing for volumes.  IMPRESSION: Interval placement of right upper extremity PICC with the tip in the upper SVC.  Lower lung volumes with basilar atelectasis superimposed on previously seen basilar opacities.  Original Report Authenticated By: Andreas Newport, M.D.    MEDICATIONS: Scheduled Meds:    . amphetamine-dextroamphetamine  20 mg Oral BID WC  . Boceprevir  800 mg Oral Q8H  . feeding supplement  237 mL Oral TID WC  . levofloxacin (LEVAQUIN) IV  250 mg Intravenous Q24H  . methadone  110 mg Oral Daily  . pantoprazole  40 mg Oral BID WC  . potassium chloride  40 mEq Oral Once  . potassium chloride  40 mEq Oral Once  . QUEtiapine  300 mg Oral QHS  . ribavirin  200 mg Oral QHS  . ribavirin  400 mg Oral Daily  . vancomycin  750 mg Intravenous Q12H  . DISCONTD: levofloxacin (LEVAQUIN) IV  750 mg Intravenous Q24H  . DISCONTD: piperacillin-tazobactam (ZOSYN)  IV  3.375 g Intravenous Q8H  . DISCONTD: QUEtiapine  600 mg Oral QHS  . DISCONTD: vancomycin  1,000 mg Intravenous Q8H  . DISCONTD: vancomycin  1,000 mg Intravenous Q12H   Continuous Infusions:    . DISCONTD: sodium chloride 20 mL/hr (09/06/11 0609)   PRN Meds:.ondansetron (ZOFRAN) IV, sodium chloride  Antibiotics: Anti-infectives     Start     Dose/Rate Route Frequency Ordered Stop   09/07/11 1800   vancomycin (VANCOCIN) 750 mg in sodium chloride 0.9 % 150 mL IVPB        750 mg 150 mL/hr over 60 Minutes Intravenous Every 12 hours 09/07/11 1234     09/07/11 1630   Levofloxacin (  LEVAQUIN)  IVPB 250 mg        250 mg 50 mL/hr over 60 Minutes Intravenous Every 24 hours 09/07/11 1235     09/07/11 0230   vancomycin (VANCOCIN) IVPB 1000 mg/200 mL premix  Status:  Discontinued        1,000 mg 200 mL/hr over 60 Minutes Intravenous Every 12 hours 09/06/11 1425 09/07/11 1235   09/06/11 1630   levofloxacin (LEVAQUIN) IVPB 750 mg  Status:  Discontinued        750 mg 100 mL/hr over 90 Minutes Intravenous Every 24 hours 09/06/11 1519 09/07/11 1236   09/06/11 1000   ribavirin (REBETOL) capsule 400 mg        400 mg Oral Daily 09/05/11 1014     09/05/11 2200   ribavirin (REBETOL) capsule 200 mg        200 mg Oral Daily at bedtime 09/05/11 1014     09/04/11 2200   cefTRIAXone (ROCEPHIN) 1 g in dextrose 5 % 50 mL IVPB  Status:  Discontinued        1 g 100 mL/hr over 30 Minutes Intravenous Daily at bedtime 09/04/11 0355 09/04/11 0737   09/04/11 2200   azithromycin (ZITHROMAX) tablet 500 mg  Status:  Discontinued        500 mg Oral Daily at bedtime 09/04/11 0355 09/04/11 0737   09/04/11 2200   ribavirin (REBETOL) capsule 200 mg  Status:  Discontinued        200 mg Oral Daily at bedtime 09/04/11 0410 09/05/11 1014   09/04/11 1100   vancomycin (VANCOCIN) IVPB 1000 mg/200 mL premix  Status:  Discontinued        1,000 mg 200 mL/hr over 60 Minutes Intravenous Every 8 hours 09/04/11 0946 09/06/11 1425   09/04/11 1000   ribavirin (REBETOL) capsule 400 mg  Status:  Discontinued        400 mg Oral Daily 09/04/11 0355 09/05/11 1014   09/04/11 0900   azithromycin (ZITHROMAX) 500 mg in dextrose 5 % 250 mL IVPB        500 mg 250 mL/hr over 60 Minutes Intravenous Every 24 hours 09/04/11 0737 09/06/11 1143   09/04/11 0900   piperacillin-tazobactam (ZOSYN) IVPB 3.375 g  Status:  Discontinued        3.375 g 12.5 mL/hr over 240 Minutes Intravenous Every 8 hours 09/04/11 0739 09/06/11 1450   09/04/11 0745   piperacillin-tazobactam (ZOSYN) IVPB 3.375 g  Status:  Discontinued        3.375 g 100  mL/hr over 30 Minutes Intravenous 3 times per day 09/04/11 0737 09/04/11 0738   09/04/11 0600   Boceprevir CAPS 800 mg        800 mg Oral 3 times per day 09/04/11 0355     09/03/11 2315   cefTRIAXone (ROCEPHIN) 1 g in dextrose 5 % 50 mL IVPB        1 g 100 mL/hr over 30 Minutes Intravenous  Once 09/03/11 2315 09/04/11 0045   09/03/11 2315   azithromycin (ZITHROMAX) 500 mg in dextrose 5 % 250 mL IVPB        500 mg 250 mL/hr over 60 Minutes Intravenous  Once 09/03/11 2315 09/04/11 0208          Assessment/Plan: Patient Active Hospital Problem List: Pneumonia -Afebrile for more than 72 hours  -Continue with Vancomycin and Levaquin 750 mg -Encourage patient to ambulate and eat, will follow clinical course    Thrombocytopenia  -chronic from Hep  C/Cirrhosis, but ?worsened by Ribavirin therapy -no signs of bleeding -will need to monitor closely   Hepatitis C infection -is getting treatment with Ribavirin, Boceprevir and Pegasys -currently claims she cannot tolerate these meds-and is not motivated to continue on them, I have advised her to see her Hepatologist Dr Timothy Lasso at the next appointment, and take up the matter with him. She seems agreeable to this -I will try to get in touch with Dr Timothy Lasso in am  Anemia -chronic, no signs of bleeding or hemolysis -is S/P 2 units of PRBC transfusion this admit, monitor H/H this admit  Fatigue -?related to depression (since on Anti Hep C meds) vs meds -await PT eval -Psych eval either this admit or as outpatient  Depression -see's Psych as outpatient -takes 300mg  of Seroquel and not 600mg , will decrease to 300 mg -psych eval as needed, but currently not suicidal or homicidal  Chronic Pain Syndrome -continue with Methadone-patient wants to see if she can come off this-I have advised her to take this issue up with her pain/methadone clinic  Hyponatremia -resolved -monitor periodically  Disposition: Remain inpatient  DVT  Prophylaxis: B/L SCD's given thrombocytopenia  Code Status: Full Code  Maretta Bees, MD. 09/07/2011, 2:22 PM

## 2011-09-07 NOTE — Evaluation (Signed)
Physical Therapy Evaluation Patient Details Name: [name] MRN: [id number] DOB: [birth year] Today's Date: 09/07/2011  Problem List:  Patient Active Problem List  Diagnoses  . Thrombocytopenia  . Leukocytopenia  . Hepatitis C infection  . Fatigue  . Peptic ulcer disease  . Hyponatremia  . Hypokalemia  . Pneumonia  . Anemia    Past Medical History:  Past Medical History  Diagnosis Date  . Hepatitis C infection   . Chronic pain syndrome   . Cervical radiculopathy   . Osteoarthritis   . Bipolar affective disorder   . GERD (gastroesophageal reflux disease)   . Thrombocytopenia   . Leukocytopenia   . Peptic ulcer disease   . Pneumonia    Past Surgical History:  Past Surgical History  Procedure Date  . Tonsilectomy, adenoidectomy, bilateral myringotomy and tubes   . Right knee arthroscopic surgery   . Total hip replacement   . Appendectomy   . Hyperectomy     due to obstructive ovarian cytst    PT Assessment/Plan/Recommendation PT Assessment Clinical Impression Statement: 51 year old admitted for Pneumonia, multiple medical complications. Pt also with very low platelets and WBC, evaluation proceeded with caution. Limited ambulation in room, PT donned mask for precaution. Pt presents with fatigue, nausea, and dizziness. Pt has reports of room spinning, would like to evaluate for vestibular dysfunction once pt able to tolerate (unable today). Dizziness likely medicine related however would like to assess to see if we can decreased symptoms. Will seek to also address weakness, balance, and endurance deficits with acute and f/u PT.   PT Recommendation/Assessment: Patient will need skilled PT in the acute care venue PT Problem List: Decreased strength;Decreased activity tolerance;Decreased balance;Decreased mobility;Decreased knowledge of use of DME Barriers to Discharge Comments: Would be best if pt could locate some support at home, as of now she just has daughter when off of  work.  PT Therapy Diagnosis : Abnormality of gait;Difficulty walking;Generalized weakness PT Plan PT Frequency: Min 3X/week PT Treatment/Interventions: DME instruction;Gait training;Functional mobility training;Therapeutic activities;Therapeutic exercise;Balance training;Patient/family education PT Recommendation Follow Up Recommendations: Home health PT;Supervision - Intermittent Equipment Recommended: Tub/shower seat;Toilet riser (riser with handles and 3n1 will not fit per pt.) PT Goals  Acute Rehab PT Goals PT Goal Formulation: With patient Time For Goal Achievement: 2 weeks Pt will go Sit to Stand: Independently PT Goal: Sit to Stand - Progress: Goal set today Pt will go Stand to Sit: Independently PT Goal: Stand to Sit - Progress: Goal set today Pt will Ambulate: >150 feet;Independently;with least restrictive assistive device PT Goal: Ambulate - Progress: Goal set today Additional Goals Additional Goal #1: Score >/= 52/56 on Berg Balance Test to indicated decreased risk for falls. PT Goal: Additional Goal #1 - Progress: Goal set today  PT Evaluation Precautions/Restrictions  Precautions Precautions: Fall Precaution Comments: Dizziness in standing Prior Functioning  Home Living Lives With: Alone Type of Home: Apartment Home Access: Level entry Home Layout: One level Bathroom Shower/Tub: Tub/shower unit;Curtain Firefighter: Standard Bathroom Accessibility: No Home Adaptive Equipment: Walker - rolling Prior Function Level of Independence: Independent Able to Take Stairs?: Yes Driving: Yes Vocation: On disability Cognition Cognition Arousal/Alertness: Awake/alert Overall Cognitive Status: Appears within functional limits for tasks assessed Orientation Level: Oriented X4 Sensation/Coordination Sensation Light Touch: Appears Intact Proprioception: Appears Intact Coordination Gross Motor Movements are Fluid and Coordinated: Yes Fine Motor Movements are Fluid  and Coordinated: Yes Extremity Assessment RLE Assessment RLE Assessment: Within Functional Limits (functional weakness evident) LLE Assessment LLE Assessment:  Within Functional Limits (functional weakness evident) Mobility (including Balance) Bed Mobility Bed Mobility: Yes Supine to Sit: 6: Modified independent (Device/Increase time) Sit to Supine: 6: Modified independent (Device/Increase time) Transfers Transfers: Yes Sit to Stand: 5: Supervision;From chair/3-in-1;From bed Sit to Stand Details (indicate cue type and reason): Supervision for safety secondary to pt reports of weakness and feeling dizzy. No physical assist needed. Pt prefers 3n1 over toilet.  Stand to Sit: 5: Supervision;To chair/3-in-1;To bed Stand to Sit Details: Supervision for safety, pt with decreased control of descent onto 3n1 Ambulation/Gait Ambulation/Gait: Yes Ambulation/Gait Assistance: 5: Supervision Ambulation/Gait Assistance Details (indicate cue type and reason): Supervision for safety secondary to slight increase in sway. Limited secondary to pt feeling nauseated.  Ambulation Distance (Feet): 30 Feet Assistive device: None Gait Pattern: Step-through pattern;Decreased stride length (limited foot clearance bilaterally) Stairs: No  Posture/Postural Control Posture/Postural Control: No significant limitations Balance Balance Assessed: Yes Static Standing Balance Static Standing - Balance Support: No upper extremity supported Static Standing - Level of Assistance: Other (comment) (min-guard assist) Static Standing - Comment/# of Minutes: Unable to fully assess secondary to pt refusal, desire to return to supine.  Exercise   Pt declines x1 viewing exercises End of Session PT - End of Session Equipment Utilized During Treatment: Gait belt Activity Tolerance: Patient limited by fatigue;Other (comment) (nauea) Patient left: in bed;with call bell in reach (pt declines chair) Nurse Communication: Mobility  status for ambulation;Other (comment) (pt request for nausea medication) General Behavior During Session: Flat affect Cognition: WFL for tasks performed Patient Class: Inpatient Wilhemina Bonito 09/07/2011, 6:34 PM  Sherie Don) Carleene Mains PT, DPT Acute Rehabilitation [phone]

## 2011-09-07 NOTE — Consult Note (Signed)
Reason for Consult:Depression Referring Physician: Dr. Jetty Peeks is an 51 y.o. female.  HPI: [name] is a 51 y.o. female  has a past medical history of Hepatitis C infection; Chronic pain syndrome; Cervical radiculopathy; Osteoarthritis; reported Bipolar affective disorder; Opiods Dep, GERD (gastroesophageal reflux disease); Thrombocytopenia; Leukocytopenia; Peptic ulcer disease; and Pneumonia.  Presented with feeling more tired for few weeks, coughing in the past few days she developed fever. She is currently being treated by Dr. Marnee Spring and ks at [facility] for hepatitis C with interferon injections. Her PPD was positive 2 weeks ago but CXR was negative.   Per she is admitted for Pneumonia this time. Follows at [facility] for her mental illness and reports hx of Bipolar disorder. Currently says she is depressed for last many years but not on any antidepressants. Per pt she never got any benefits from antidepressant but likes Seroquel and it helps her depression and mood. More sad these days secondary to her chronic medical problems (Hep C,  Chronic pain), her prognoses outcome and methadone dependence. Wanted to stop methadone now. Follows with methadone clinic right now in [place]. No SI,  HI, NO AVH. Was non complaints with her meds before this admission and cold not tell why. No manic or psychotic symptoms right now.   Past Medical History  Diagnosis Date  . Hepatitis C infection   . Chronic pain syndrome   . Cervical radiculopathy   . Osteoarthritis   . Bipolar affective disorder   . GERD (gastroesophageal reflux disease)   . Thrombocytopenia   . Leukocytopenia   . Peptic ulcer disease   . Pneumonia     Past Surgical History  Procedure Date  . Tonsilectomy, adenoidectomy, bilateral myringotomy and tubes   . Right knee arthroscopic surgery   . Total hip replacement   . Appendectomy   . Hyperectomy     due to obstructive ovarian cytst    Family History    Problem Relation Age of Onset  . Breast cancer Sister     Social History:  reports that she has been smoking Cigarettes.  She has a 10 pack-year smoking history. She has never used smokeless tobacco. She reports that she does not drink alcohol or use illicit drugs.  Allergies:  Allergies  Allergen Reactions  . Aspirin     Vomits blood    Medications: I have reviewed the patient's current medications.  Results for orders placed during the hospital encounter of 09/03/11 (from the past 48 hour(s))  CBC     Status: Abnormal   Collection Time   09/06/11  5:00 AM      Component Value Range Comment   WBC 5.1  4.0 - 10.5 (K/uL)    RBC 2.74 (*) 3.87 - 5.11 (MIL/uL)    Hemoglobin 9.0 (*) 12.0 - 15.0 (g/dL)    HCT 16.1 (*) 09.6 - 46.0 (%)    MCV 102.6 (*) 78.0 - 100.0 (fL)    MCH 32.8  26.0 - 34.0 (pg)    MCHC 32.0  30.0 - 36.0 (g/dL)    RDW 04.5 (*) 40.9 - 15.5 (%)    Platelets 26 (*) 150 - 400 (K/uL)   BASIC METABOLIC PANEL     Status: Abnormal   Collection Time   09/06/11  5:00 AM      Component Value Range Comment   Sodium 134 (*) 135 - 145 (mEq/L)    Potassium 3.6  3.5 - 5.1 (mEq/L)  Chloride 96  96 - 112 (mEq/L)    CO2 29  19 - 32 (mEq/L)    Glucose, Bld 91  70 - 99 (mg/dL)    BUN 11  6 - 23 (mg/dL)    Creatinine, Ser 1.61  0.50 - 1.10 (mg/dL) DELTA CHECK NOTED   Calcium 8.9  8.4 - 10.5 (mg/dL)    GFR calc non Af Amer 70 (*) >90 (mL/min)    GFR calc Af Amer 81 (*) >90 (mL/min)   VANCOMYCIN, TROUGH     Status: Abnormal   Collection Time   09/06/11  1:00 PM      Component Value Range Comment   Vancomycin Tr 22.8 (*) 10.0 - 20.0 (ug/mL)   COMPREHENSIVE METABOLIC PANEL     Status: Abnormal   Collection Time   09/07/11  6:58 AM      Component Value Range Comment   Sodium 137  135 - 145 (mEq/L)    Potassium 3.4 (*) 3.5 - 5.1 (mEq/L)    Chloride 100  96 - 112 (mEq/L)    CO2 29  19 - 32 (mEq/L)    Glucose, Bld 74  70 - 99 (mg/dL)    BUN 13  6 - 23 (mg/dL)    Creatinine,  Ser 0.96 (*) 0.50 - 1.10 (mg/dL)    Calcium 9.0  8.4 - 10.5 (mg/dL)    Total Protein 6.8  6.0 - 8.3 (g/dL)    Albumin 2.6 (*) 3.5 - 5.2 (g/dL)    AST 38 (*) 0 - 37 (U/L)    ALT 14  0 - 35 (U/L)    Alkaline Phosphatase 40  39 - 117 (U/L)    Total Bilirubin 0.6  0.3 - 1.2 (mg/dL)    GFR calc non Af Amer 41 (*) >90 (mL/min)    GFR calc Af Amer 47 (*) >90 (mL/min)   CBC     Status: Abnormal   Collection Time   09/07/11  6:58 AM      Component Value Range Comment   WBC 2.8 (*) 4.0 - 10.5 (K/uL)    RBC 2.77 (*) 3.87 - 5.11 (MIL/uL)    Hemoglobin 8.9 (*) 12.0 - 15.0 (g/dL)    HCT 04.5 (*) 40.9 - 46.0 (%)    MCV 102.2 (*) 78.0 - 100.0 (fL)    MCH 32.1  26.0 - 34.0 (pg)    MCHC 31.4  30.0 - 36.0 (g/dL)    RDW 81.1 (*) 91.4 - 15.5 (%)    Platelets 25 (*) 150 - 400 (K/uL)     No results found.  ROS Blood pressure 129/70, pulse 75, temperature 97.9 F (36.6 C), temperature source Oral, resp. rate 18, height 5' 6.93" (1.7 m), weight 81 kg (178 lb 9.2 oz), SpO2 91.00%. Physical Exam   MSE:  Objective: Appearance: Fairly Groomed Psychomotor Activity: Normal Eye Contact:: Fair Speech: Clear and Coherent Volume: slow Mood:  depressed Affect: ristricted Thought Process: goal oriented Orientation: Full Thought Content: Suicidal Thoughts: yes no specific plans Homicidal Thoughts: No Judgement: fair Insight: poor  Axis I: Opiod Dep, r/o mood d/o secondary to Opiods, hx of Bipolar d/o  Axis II: Deferred Axis III: see med hx Axis IV: non-cpmpliant with meds Axis V: 50    Plan:   1. Recommend out psy follow up and therapy. Pt is following at [facility] and her last follow up was about 1 month ago. Pt agreed. Her worsening of depression may be secondary to mutiple factors like being  non-compliants with meds, methadone use or antivirals.   2. Continue current psychotorpics and will recommend to follow up at [facility] to adjust meds as needed  3. Recommend to talk with  Methadone clinic for stopping the [facility] and also with [facility] for that to see the available resources. Pt agreed  4. Will sign off. plz re-consult if needed.    Wonda Cerise 09/07/2011, 8:45 PM

## 2011-09-08 DIAGNOSIS — D61818 Other pancytopenia: Secondary | ICD-10-CM

## 2011-09-08 LAB — COMPREHENSIVE METABOLIC PANEL
ALT: 14 U/L (ref 0–35)
Alkaline Phosphatase: 40 U/L (ref 39–117)
BUN: 13 mg/dL (ref 6–23)
CO2: 29 mEq/L (ref 19–32)
Chloride: 100 mEq/L (ref 96–112)
GFR calc Af Amer: 47 mL/min — ABNORMAL LOW (ref >90)
GFR calc non Af Amer: 41 mL/min — ABNORMAL LOW (ref >90)
Glucose, Bld: 74 mg/dL (ref 70–99)
Potassium: 3.4 mEq/L — ABNORMAL LOW (ref 3.5–5.1)
Sodium: 137 mEq/L (ref 135–145)
Total Bilirubin: 0.6 mg/dL (ref 0.3–1.2)
Total Protein: 6.8 g/dL (ref 6.0–8.3)

## 2011-09-08 LAB — CBC
Hemoglobin: 8.9 g/dL — ABNORMAL LOW (ref 12.0–15.0)
MCH: 33.6 pg (ref 26.0–34.0)
Platelets: 25 10*3/uL — CL (ref 150–400)
RBC: 2.65 MIL/uL — ABNORMAL LOW (ref 3.87–5.11)
WBC: 2.2 10*3/uL — ABNORMAL LOW (ref 4.0–10.5)

## 2011-09-08 MED ORDER — LEVOFLOXACIN 500 MG PO TABS: 500.0000 mg | ORAL_TABLET | Freq: Every day | ORAL | Status: AC

## 2011-09-08 MED ORDER — AMPHETAMINE-DEXTROAMPHETAMINE 10 MG PO TABS
20.0000 mg | ORAL_TABLET | Freq: Two times a day (BID) | ORAL | Status: DC
  Filled 2011-09-08: qty 2

## 2011-09-08 MED ORDER — LEVOFLOXACIN 500 MG PO TABS
500.0000 mg | ORAL_TABLET | Freq: Every day | ORAL | Status: DC
  Administered 2011-09-08: 500 mg via ORAL
  Filled 2011-09-08: qty 1

## 2011-09-08 MED ORDER — POTASSIUM CHLORIDE CRYS ER 20 MEQ PO TBCR
40.0000 meq | EXTENDED_RELEASE_TABLET | Freq: Once | ORAL | Status: AC
  Administered 2011-09-08: 40 meq via ORAL

## 2011-09-08 NOTE — Progress Notes (Signed)
   CARE MANAGEMENT NOTE 09/08/2011  Patient:  [name], [name]   Account Number:  [id number]  Date Initiated:  09/04/2011  Documentation initiated by:  MAYO,HENRIETTA  Subjective/Objective Assessment:   51 yr-old female adm with PNA; lives alone, independent PTA.     Action/Plan:   Anticipated DC Date:  09/08/2011   Anticipated DC Plan:  HOME W HOME HEALTH SERVICES      DC Planning Services  CM consult      Choice offered to / List presented to:  C-1 Patient   DME arranged  [employer]      DME agency  [facility]     HH arranged  HH-3 OT  HH-2 PT  HH-6 SOCIAL WORKER      HH agency  [facility]   Status of service:  Completed, signed off Medicare Important Message given?   (If response is "NO", the following Medicare IM given date fields will be blank) Date Medicare IM given:   Date Additional Medicare IM given:    Discharge Disposition:  HOME W HOME HEALTH SERVICES  Per UR Regulation:    If discussed at Long Length of Stay Meetings, dates discussed:    Comments:  PCP:  Dr. Lerry Liner  09/08/11 11:56 Letha Cape RN, BSN [phone] patient lives alone, per physical therapy recs hhpt, patient chose [facility] to work with.  Referral made to [facility] for hhpt/ot Metallurgist.  Patient for dc today.  Soc will begin 24-48 hrs post discharge. Made referral to [employer] , but patient does not meet criteria.

## 2011-09-08 NOTE — Progress Notes (Signed)
PICC Line dc'd per MD order. Line intact at 41 cm. Manual pressure held x 5 minutes. No active bleeding noted. Vaseline gauze pressure dressing applied and secured. Pt. Instructed to keep dressing dry and intact x 24 hours.

## 2011-09-08 NOTE — Progress Notes (Signed)
Chart review complete. Thank you to Letha Cape RN CM for this referral.  [name] does not meet admission criteria for [facility] Care Management services at this time. For any additional questions or new referrals please contact Anibal Henderson BSN RN [facility] Liaison at [phone].

## 2011-09-08 NOTE — Discharge Summary (Signed)
Patient ID: [name] MRN: [id number] DOB/AGE: [birth year] 51 y.o.  Admit date: 09/03/2011 Discharge date: 09/08/2011  Primary Care Physician:  Mia Lesch, MD, MD  Discharge Diagnoses:   Present on Admission Principal Problem:  *Pneumonia Active Problems:  Thrombocytopenia  Hepatitis C infection  Fatigue  Hyponatremia  Hypokalemia  Anemia  Pancytopenia   Medication List  As of 09/08/2011  2:48 PM   TAKE these medications         amphetamine-dextroamphetamine 20 MG tablet   Commonly known as: ADDERALL   Take 20 mg by mouth 2 (two) times daily as needed. For add      Boceprevir 200 MG Caps   Take 800 mg by mouth every 8 (eight) hours.      levofloxacin 500 MG tablet   Commonly known as: LEVAQUIN   Take 1 tablet (500 mg total) by mouth daily.      methadone 10 MG/ML solution   Commonly known as: DOLOPHINE   Take 110 mg by mouth daily.      peginterferon alfa-2a 180 MCG/ML injection   Commonly known as: PEGASYS   Inject 180 mcg into the skin every 7 (seven) days. Monday      QUEtiapine 300 MG tablet   Commonly known as: SEROQUEL   Take 600 mg by mouth at bedtime.      ribavirin 200 MG capsule   Commonly known as: REBETOL   Take 200-400 mg by mouth 2 (two) times daily. 400mg  in the morning; 200mg  in the evening            Consultation:  Psychiatry, Mia. Wonda [name] Procedures:  Transfusion 2 units Packed Red Blood Cells without complication.  Brief H and P, from the admission note:[name] is a 51 y.o. female has a past medical history of Hepatitis C infection; Chronic pain syndrome; Cervical radiculopathy; Osteoarthritis; Bipolar affective disorder; GERD (gastroesophageal reflux disease); Thrombocytopenia; Leukocytopenia; Peptic ulcer disease; and Pneumonia.  Presented with feeling poorly for few weeks, coughing in the past few days she developed fever. She is currently being treated by Mia. Jacqualine [name] at [facility] for hepatitis C with interferon  injections. Her PPD was positive 2 weeks ago but CXR was negative.  Her hemoglobin has trended down to 6.9. Of note, she regularly gets methadone on a daily basis 110 mg in the morning.   Hospital Course: Pneumonia  Chest xray at the time of admission demonstrated dense retrocardiac and patchy bilateral airspace opacities. The dense retrocardiac opacity was seen previously but the bilateral opacities were felt to be pneumonia.  The patient was treated with broad spectrum IV antibiotics and quickly improved.  Her antibiotics were narrowed to Vancomycin and Levaquin.  She will be discharged to home on oral Levaquin for 5 more days - for a total of 10 days of antibiotic therapy.  She has be afebrile for over 72 hours, and is able to get up and move about the room easily.  Thrombocytopenia/Pancytopenia Chronic from Hep C/Cirrhosis, but possibly worsened by Hep C medications.  Current platelet count is 25.  This is monitored by Mia. Brooke [name] who will see her in 1 week in follow up. -no signs of bleeding   Hepatitis C infection  Receiving treatment with Ribavirin, Boceprevir and Pegasys managed by Mia. Jacqualine [name] -[name] was consulted on the phone, and he advised to continue on current regimen, I did tell him that the patient was not very motivated to continue treatment. He also reviewed patient's labs-and  advised no changes in the dosing of these meds as well. He will follow up patient this Thursday and talk with patient. He will also follow her CBC as well.Since her platelets were holding steady at >20 thousand, no further recommendation were made by him.   Anemia  Chronic, no signs of bleeding or hemolysis  She received 2 units of PRBC transfusion this admit, hemoglobin has remained stable after transfusion.  Depression  Patient was evaluated by psychiatry who recommended continued outpatient follow up at [facility].  It was felt that the worsening of her depression may be secondary to mutiple factors  like being non-compliants with meds, methadone use or antivirals. Continuation of current psychotropics was recommended. Further they recommend that the patient talk with the methadone clinic ([facility]) and [facility] about stopping the methadone to find out what resources are available to help her through the process.  Chronic Pain Syndrome  Continue with Methadone-patient wants to see if she can come off of it.  We have advised her to continue the methadone until she can take this issue up with her pain/methadone clinic.   Hyponatremia -  Was mild, now resolved.  Physical Exam on Discharge: General: Alert, awake, oriented x3, in no acute distress. Gets up and walks to the bathroom with no difficulty. HEENT: No bruits, no goiter. Heart: Regular rate and rhythm, without murmurs, rubs, gallops. Lungs: Clear to auscultation bilaterally. Abdomen: obese Soft, nontender, nondistended, positive bowel sounds. Extremities: No clubbing cyanosis or edema with positive pedal pulses. Neuro: Grossly intact, nonfocal.  Filed Vitals:   09/07/11 2114 09/08/11 0238 09/08/11 0444 09/08/11 1425  BP: 136/74 128/74 130/75 106/64  Pulse: 70 66 63 78  Temp: 98.3 F (36.8 C) 97.9 F (36.6 C) 98 F (36.7 C) 98 F (36.7 C)  TempSrc: Oral Oral Oral Oral  Resp: 18 18 18 18   Height:      Weight:      SpO2: 92% 91% 94% 95%     Intake/Output Summary (Last 24 hours) at 09/08/11 1448 Last data filed at 09/08/11 1425  Gross per 24 hour  Intake 1210.67 ml  Output      0 ml  Net 1210.67 ml    Basic Metabolic Panel:  Lab 09/07/11 1610 09/06/11 0500 09/05/11 0520  NA 137 134* --  K 3.4* 3.6 --  CL 100 96 --  CO2 29 29 --  GLUCOSE 74 91 --  BUN 13 11 --  CREATININE 1.47* 0.94 --  CALCIUM 9.0 8.9 --  MG -- -- 1.9  PHOS -- -- --   Liver Function Tests:  Lab 09/07/11 0658 09/05/11 0520  AST 38* 35  ALT 14 14  ALKPHOS 40 54  BILITOT 0.6 0.7  PROT 6.8 7.2  ALBUMIN 2.6* 2.8*    Lab 09/03/11 2202    AMMONIA 44   CBC:  Lab 09/08/11 0835 09/07/11 0658 09/04/11 0520 09/03/11 2201  WBC 2.2* 2.8* -- --  NEUTROABS -- -- 4.3 4.5  HGB 8.9* 8.9* -- --  HCT 27.3* 28.3* -- --  MCV 103.0* 102.2* -- --  PLT 25* 25* -- --   Coagulation:  Lab 09/03/11 2201  LABPROT 14.8  INR 1.14   Anemia Panel:  Lab 09/04/11 0520  VITAMINB12 521  FOLATE >20.0  FERRITIN 435*  TIBC 225*  IRON 26*  RETICCTPCT 2.3   Urine Drug Screen: Drugs of Abuse     Component Value Date/Time   LABOPIA NONE DETECTED 09/03/2011 2137   LABOPIA POS*  07/25/2010 2038   COCAINSCRNUR NONE DETECTED 09/03/2011 2137   COCAINSCRNUR NEG 07/25/2010 2038   LABBENZ NONE DETECTED 09/03/2011 2137   LABBENZ NEG 07/25/2010 2038   AMPHETMU NONE DETECTED 09/03/2011 2137   AMPHETMU POS* 07/25/2010 2038   THCU NONE DETECTED 09/03/2011 2137   LABBARB NONE DETECTED 09/03/2011 2137      Significant Diagnostic Studies:    Dg Chest 2 View  09/04/2011  *RADIOLOGY REPORT*  Clinical Data: Fever, cough and congestion; body aches.  History of smoking.  CHEST - 2 VIEW  Comparison: Chest radiograph performed 08/12/2011  Findings: The lungs are well-aerated.  Dense retrocardiac and patchy bilateral airspace opacification raises concern for multifocal pneumonia.  There is no evidence of pleural effusion or pneumothorax.  The heart is normal in size; calcification is noted in the aortic arch.  Cervical spinal fusion hardware is partially imaged.  No acute osseous abnormalities are seen.  IMPRESSION: Dense retrocardiac and patchy bilateral airspace opacities raise concern for multifocal pneumonia.  Original Report Authenticated By: Tonia Ghent, M.D.   Ct Head Wo Contrast  09/04/2011  *RADIOLOGY REPORT*  Clinical Data: Altered mental status  CT HEAD WITHOUT CONTRAST  Technique:  Contiguous axial images were obtained from the base of the skull through the vertex without contrast.  Comparison: 10/20/2010  Findings: Motion degraded images.  No evidence of  parenchymal hemorrhage or extra-axial fluid collection. No mass lesion, mass effect, or midline shift.  No CT evidence of acute infarction.  Intracranial atherosclerosis.  Cerebral volume is age appropriate.  No ventriculomegaly.  Mild mucosal thickening in the bilateral sphenoid sinuses.  The mastoid air cells are clear.  No evidence of calvarial fracture.  IMPRESSION: Motion degraded images.  No evidence of acute intracranial abnormality.  Original Report Authenticated By: Charline Bills, M.D.    Dg Chest Port 1 View  09/04/2011  *RADIOLOGY REPORT*  Clinical Data: Line placement.  PORTABLE CHEST - 1 VIEW  Comparison: 09/03/2011.  Findings: Lower cervical ACDF noted.  Right upper extremity PICC is present with the tip in the upper SVC.  Lower lung volumes with more prominent basilar atelectasis.  Basilar opacity is little changed compared to prior allowing for volumes.  IMPRESSION: Interval placement of right upper extremity PICC with the tip in the upper SVC.  Lower lung volumes with basilar atelectasis superimposed on previously seen basilar opacities.  Original Report Authenticated By: Andreas Newport, M.D.    Disposition and Follow-up: Stable for discharge to home.  Will request home health PT/OT.  Patient has transportation difficulties and has been given the information on transportation provided via medicaid as a back up if her family is not available to take her to follow up with Mia. Jacqualine [name], and [name].  Discharge Orders    Future Appointments: Provider: Department: Dept Phone: Center:   10/03/2011 10:00 AM [name] [facility] [phone] None   01/29/2012 1:00 PM [name] [facility] [phone] None   06/04/2012 10:00 AM [name] [facility] [phone] None   06/04/2012 10:30 AM Mia Parody, MD [facility] [phone] None     Future Orders Please Complete By Expires   Diet - low sodium heart healthy      Increase activity slowly      Discharge  instructions      Comments:   Follow up with Mia. Jacqualine [name] regarding blood counts and Hepatitis C medications.   Follow up with [facility] regarding depression and discontinuation of Methadone.  Follow up with [facility]  regarding tapering off Methodone.     Follow-up Information    Schedule an appointment as soon as possible for a visit with Mia Lesch, MD.   Contact information:   [phone]  [place] [address] [phone]       Follow up with ZACKS,STEVEN L, MD. Schedule an appointment as soon as possible for a visit on 09/11/2011.   Contact information:   [address] [place] [address] [phone]          Time spent on Discharge: 40  Min.  SignedJeoffrey Massed 09/08/2011, 2:48 PM [phone]

## 2011-09-08 NOTE — Progress Notes (Signed)
09/08/11 NSG 1530 Patient discharged to home with family, discharged instructions given and reviewed with patient.  Patient verbalized understanding, care notes given for new meds and pertinent education. Skin intact at discharge, IV discharged and intact. Patient escorted to car via wheelchair by NT.

## 2011-09-10 LAB — CULTURE, BLOOD (ROUTINE X 2)
Culture  Setup Time: 201304110132
Culture: NO GROWTH

## 2011-09-11 ENCOUNTER — Ambulatory Visit (INDEPENDENT_AMBULATORY_CARE_PROVIDER_SITE_OTHER): Payer: Medicare Other | Admitting: Gastroenterology

## 2011-09-11 DIAGNOSIS — B182 Chronic viral hepatitis C: Secondary | ICD-10-CM

## 2011-09-11 MED ORDER — PROMETHAZINE HCL 12.5 MG PO TABS: 25.0000 mg | ORAL_TABLET | Freq: Four times a day (QID) | ORAL | Status: DC | PRN

## 2011-09-13 LAB — CBC WITH DIFFERENTIAL/PLATELET
Basophils Relative: 0 % (ref 0–1)
Eosinophils Absolute: 0 10*3/uL (ref 0.0–0.7)
Eosinophils Relative: 1 % (ref 0–5)
Hemoglobin: 8.8 g/dL — ABNORMAL LOW (ref 12.0–15.0)
Lymphs Abs: 0.8 10*3/uL (ref 0.7–4.0)
MCH: 32.8 pg (ref 26.0–34.0)
MCHC: 30.9 g/dL (ref 30.0–36.0)
MCV: 106.3 fL — ABNORMAL HIGH (ref 78.0–100.0)
Monocytes Relative: 11 % (ref 3–12)
Neutrophils Relative %: 46 % (ref 43–77)

## 2011-09-13 LAB — HEPATIC FUNCTION PANEL
ALT: 9 U/L (ref 0–35)
Alkaline Phosphatase: 46 U/L (ref 39–117)
Bilirubin, Direct: 0.2 mg/dL (ref 0.0–0.3)
Indirect Bilirubin: 0.4 mg/dL (ref 0.0–0.9)
Total Protein: 7 g/dL (ref 6.0–8.3)

## 2011-09-15 LAB — PATHOLOGIST SMEAR REVIEW

## 2011-09-16 LAB — HEPATITIS C RNA QUANTITATIVE

## 2011-09-18 NOTE — Progress Notes (Signed)
[name], [name]  MR#:  [id number]      DATE:  09/11/2011  DOB:  02/01/1961    cc: Referring Physician: Dineen Kid. Reche Dixon, MD, [address], [place], [place] [address], Fax [phone]   Primary Care Physician: Same   Lucile Crater, MD, [facility], [address] [place] [address], [place], [place] [address]   Consulting Physicians: Manuela Neptune, PMHNP-BC, [facility] [place], [address], [place], [place] [address], Fax [phone]   Naaman Plummer, MD, [address], [address], [place], [place] [address]   Jethro Bolus, MD, [facility], [address] [place], [place], [place] [address]     REASON FOR VISIT:  Follow up of genotype 1a hepatitis C, cirrhosis, week 12 of therapy.   HISTORY:  The patient returns today accompanied by her daughter. Her start date for therapy was 06/20/2011. She is approximately week 12 (rounded up) into treatment. Her boceprevir started on 07/16/2011. It should be recalled that her initial dosing of ribavirin was 200 mg b.i.d., despite being counseled on 600 mg b.i.d., for the first few weeks.   Since last being seen on 08/14/2011, the patient was hospitalized at [facility] from 09/03/2011-09/08/2011 with "pneumonia." It was also noted that she had the typical cytopenias as a consequence of treatment. The patient was treated with vancomycin and Levaquin in hospital, and Levaquin as an outpatient. She also was given 2 units of packed cells during the hospitalization. During hospitalization, she was expressed a lack of motivation to continue with treatment, and may have missed 1 days' worth of medication. The hospitalist, who was caring for her, contacted me and we discussed this by phone. He reported that by the time he contacted me that she wanted to continue with therapy, particularly after being told that her HCV RNA was negative. Week 8 HCV RNA was negative. Today again the patient confirmed that she only  missed dose 1 days' worth of ribavirin and boceprevir in the hospital. She has, while back on medications, she has nausea and retroorbital headache. She has not tried anything for either at this time. Otherwise, there are no new complaints.   PAST MEDICAL HISTORY:  There has been no interval change.   CURRENT MEDICATIONS:  1. Boceprevir 800 mg every 8 hours.  2. Ribavirin 400 mg a.m., 200 mg p.m. 3. Quetiapine 600 mg at bedtime.  4. Pegasys 180 mcg weekly.   5. Methadone 110 mg daily.  6. Levothyroxine 500 mg daily, that is due to finish.  7. Adderall 20 mg b.i.d. p.r.n.   ALLERGIES:  Denies.   HABITS:  Smoking a quarter pack of cigarettes per day. Alcohol denies interval consumption.   REVIEW OF SYSTEMS:  All 10 systems reviewed today with the patient and they are negative other than which was mentioned above. CES-D was 47.   PHYSICAL EXAMINATION:  Constitutional: Stated age. There is some pallor. There is no significant wasting. Vital Signs: Height 67 inches, weight 177 pounds, down 6 pounds from previous, blood pressure 130/77, pulse 76, temperature 98.2 Fahrenheit.   LABORATORIES:  I have reviewed her labs within [facility]. Ultrasound on 08/25/2011, was as expected.   ASSESSMENT:  The patient is a 51 year old woman with history of genotype 1a HCV, IL28B CC, cirrhosis by imaging and labs. Her commencement date for therapy was 06/20/2011, with Pegasys and ribavirin, putting her at week approximately 12 of treatment. Boceprevir was added on02/20/2013. Her week 4 HCV RNA was 284,470 international units  per mL (5.45 log international units per mL) with a baseline of 1,610,960 international units per mL (6.25 log international units per mL). Her week 8 HCV RNA was undetectable despite her compromised dose of ribavirin.   More recently with her hospitalization, she was significantly anemic, as well as generally pancytopenic. She has dose reduced her ribavirin. I may need to use of  erythropoietin, as well depending on how she does. Because of cirrhosis, despite the negative HCV RNA at week 8, she will need to stay on therapy for 48 weeks in total. Hopefully her HCV RNA at this point will be still negative despite dose interruptions. If this is positive, she will need to stop treatment.   In terms of her cirrhosis care, she was screened for varices by EGD on 03/04/2011, and she has never bled. She would need a repeat in October 2015. She was screened by imaging on 08/25/2011, and would need a repeat ultrasound by October 2013. There is no encephalopathy or ascites to treat. She was previously hepatitis A immune in the [facility] office, but negative at [facility] [place]. I consider her to be immune. She has also received hepatitis B vaccination through [facility] in early 2010 to early 2011. She needs need to receive Pneumovax and flu shot through her primary physician.    In my discussion today with the patient and her daughter, we discussed management of her symptoms. We discussed using Phenergan, which she is agreeable to. We discussed the significance of viral loads and at which time points. I have asked her to now start getting her CBC done on a weekly basis with a standing order to [facility] rather than every other week as she was doing.   PLAN:  1. CBC with liver enzymes, and HCV RNA for week 12 today.  2. Hepatitis A immune and hepatitis B vaccination completed on 07/05/2009.  3. Ultrasound due by October 2013.  4. EGD, we will need to repeat October 2015.  5. Flu shot and Pneumovax through primary physician.  6. She is to go on a weekly basis for CBC through [facility].  7. Return in 2 weeks' time.              Brooke Dare, MD  ADDENDUM Week 12 HCV RNA undetectable.   HgB 8.8 Will initiate Procrit.   403 .[id number]  D:  Thu Apr 18 14:14:55 2013 ; T:  Thu Apr 18 20:07:17 2013  Job #:  [id number]

## 2011-09-19 ENCOUNTER — Other Ambulatory Visit: Payer: Self-pay | Admitting: Gastroenterology

## 2011-09-19 ENCOUNTER — Telehealth: Payer: Self-pay | Admitting: Gastroenterology

## 2011-09-19 DIAGNOSIS — R112 Nausea with vomiting, unspecified: Secondary | ICD-10-CM

## 2011-09-19 MED ORDER — ONDANSETRON HCL 4 MG PO TABS
8.0000 mg | ORAL_TABLET | Freq: Three times a day (TID) | ORAL | Status: DC | PRN
Start: 2011-09-19 — End: 2012-05-24

## 2011-09-19 NOTE — Telephone Encounter (Signed)
Verified insurance benefits to file for prior auth for Procrit.  [employer] rx bin [phone] rx pcn meddadv rx grp  rxcvsd Issuer [id number]: [id number] ID [id number] Name [name]   Also because of the time it will take to get the Procrit, I instructed Ms Herbig to reduce the ribavirin to 200 mg AM.  She reports that the boceprevir is still making her nauseated despite the phenergan.  So I have submitted a prescription to [facility] for ondansetron 8 mg Q8H PRN

## 2011-09-21 ENCOUNTER — Telehealth: Payer: Self-pay | Admitting: Gastroenterology

## 2011-09-21 NOTE — Telephone Encounter (Signed)
I spoke with Ms Tuel, who says she was approved for half the ondansetron I ordered but she does not need it at this time, so she has not obtained it from her pharmacy.  Ms Hair thought her symptoms were worse on Friday because of the effect of the interferon.  Also she has not heard from [facility] regarding the Procrit, a prescription for which I faxed to [facility] with the prior auth letter on 09/19/11.  I explained that Ms. Matte should self administer the Procrit on the day of receipt.  She will also present for her labs this week ahead of her appt next Thursday.

## 2011-09-22 ENCOUNTER — Other Ambulatory Visit: Payer: Self-pay | Admitting: Gastroenterology

## 2011-09-22 LAB — CBC WITH DIFFERENTIAL/PLATELET
Basophils Absolute: 0 10*3/uL (ref 0.0–0.1)
Basophils Relative: 0 % (ref 0–1)
Eosinophils Absolute: 0.1 10*3/uL (ref 0.0–0.7)
Eosinophils Relative: 3 % (ref 0–5)
HCT: 30.1 % — ABNORMAL LOW (ref 36.0–46.0)
Hemoglobin: 9.2 g/dL — ABNORMAL LOW (ref 12.0–15.0)
MCH: 32.6 pg (ref 26.0–34.0)
MCHC: 30.6 g/dL (ref 30.0–36.0)
MCV: 106.7 fL — ABNORMAL HIGH (ref 78.0–100.0)
Monocytes Absolute: 0.3 10*3/uL (ref 0.1–1.0)
Monocytes Relative: 14 % — ABNORMAL HIGH (ref 3–12)
Neutro Abs: 0.6 10*3/uL — ABNORMAL LOW (ref 1.7–7.7)
RDW: 17.1 % — ABNORMAL HIGH (ref 11.5–15.5)

## 2011-09-25 ENCOUNTER — Ambulatory Visit (INDEPENDENT_AMBULATORY_CARE_PROVIDER_SITE_OTHER): Payer: Medicare Other | Admitting: Gastroenterology

## 2011-09-25 DIAGNOSIS — B182 Chronic viral hepatitis C: Secondary | ICD-10-CM

## 2011-10-02 ENCOUNTER — Other Ambulatory Visit: Payer: Self-pay | Admitting: Gastroenterology

## 2011-10-02 NOTE — Progress Notes (Signed)
[name], [name]  MR#:  [id number]      DATE:  09/25/2011  DOB:  March 07, 1961    cc: Referring Physician: Dineen Kid. Reche Dixon, MD, [address] [place], [place] [address], Fax [phone]   Primary Care Physician: Same   Lucile Crater, MD, [facility], [address], [place], [place] [address]   Consulting Physicians: Manuela Neptune, PMHNP-BC, [facility], [place], [address], [place], [place] [address], Fax [phone]   Naaman Plummer, MD, [address], [address], [place], [place] [address]   Jethro Bolus, MD, [facility], [address] [place], [place], [place] [address]     REASON FOR VISIT:  Follow up of genotype 1a hepatitis C, cirrhosis, week 14 of therapy.   HISTORY:  The patient returns today unaccompanied. Her start date for therapy was 06/20/2011. She is approximately week 14 into treatment. Her boceprevir started on 07/16/2011. It should be recalled that her initial dosing of ribavirin was 200 mg b.i.d., despite being counseled on 600 mg b.i.d. for the first 2 weeks of therapy.   Since I last saw her on 09/11/2011, the patient has continued to have significant anemia most likely as a result of the ribavirin with an element of boceprevir. I contacted her last week on 09/19/2011, with instructions to dose reduce the ribavirin to 200 mg daily. In addition, I obtained prior authorization from her insurer for erythropoietin at 60,000 units weekly and faxed it to [facility] within an hour of receiving the authorization on 09/19/2011. Today, however, the patient states that she is still not taken delivery of the erythropoietin. It is suppose to come tomorrow. In addition, when I spoke to her that day she complained of significant nausea. She reported this was not responding to Phenergan, because of this I obtaining authorization for ondansetron. She reports that her insurer would only approved 15 tablets a month of  8 mg q. 8 hours p.r.n.   Today, the patient reports that a lot of the symptoms she is experiencing, on 09/19/2011, is a result of the acute worsening symptoms she experiences right after the interferon. I had contacted her after injection last Friday. Today, the patient feels that her energy is feeling somewhat better. Her nausea is significantly better even without using the ondansetron. However, she still complains of significant fatigue. It will be recalled that she was told that she had "pneumonia" when she was first hospitalized at [facility]. She has a cough that is still somewhat productive of sputum, but has no fever, chills, or chest pain. She has shortness of breath. Of course, this is also with fatigue.   PAST MEDICAL HISTORY:  There is no other interval change.   CURRENT MEDICATIONS:  1. Boceprevir 800 mg every 8 hours.  2. Ribavirin 200 mg daily.  3. Quetiapine 600 mg at bedtime. 4. Pegasys 180 mcg weekly. 5. Methadone 110 mg daily.  6. Adderall 20 mg b.i.d. p.r.n.  7. Ondansetron 8 mg q. 8 hours p.r.n.  8. Erythropoietin as Procrit 60,000 units subcu weekly, suppose to start tomorrow.   ALLERGIES:  Denies.   HABITS:  Smoking quarter pack of cigarettes per day. Alcohol denies interval consumption.   REVIEW OF SYSTEMS:  All 10 systems reviewed today with the patient and they are negative other than which is mentioned above. Her CES-D was 48.   PHYSICAL EXAMINATION:  Constitutional: Pallor without significant peripheral wasting. Vital signs: Height 67 inches, weight 174 pounds, blood  pressure 105/64, pulse of 88, temperature 98.1 Fahrenheit. Ears, nose, mouth and throat: Unremarkable oropharynx. No thyromegaly or neck masses. Chest resonant to percussion. Clear to auscultation. Cardiovascular: Heart sounds normal S1, S2 without murmurs, rubs. There was no peripheral edema. Abdominal: Normal bowel sounds. No masses or tenderness. I could not appreciate liver edge or spleen tip.     ASSESSMENT:  This is a 51 year old woman with history of genotype 1a hepatitis C, IL28B CC, cirrhosis by imaging and labs. Her commencement date for therapy was 06/20/2011, with Pegasys and ribavirin. She is at approximately week 14 of treatment. Her boceprevir was added on 07/16/2011. Her week 4 HCV RNA was 284,470 international units per mL or 5.45 log international units per mL with a baseline of 1,610,960 international units per mL, or 6.25 log international units per mL. Her week 8 HCV RNA was undetectable, despite her compromised dose of ribavirin. Her week 12 HCV RNA was also undetectable. However, because of imaging to suggest cirrhosis, despite the virologic response today, I do not think she will qualify for response-guided therapy to reduce the duration of treatment below 48 weeks.   In terms of anemia, this is the result of the ribavirin principally. I have dose reduced her ribavirin, all that I would like to. I am afraid that if I eliminate the ribavirin altogether, it will compromise her chance of responding. When she starts on erythropoietin, again, I can gradually bring the ribavirin up to 600 mg daily. The CBC this week showed some slight increase in the hemoglobin to 9.2 from 8.8, suggesting that reduction of ribavirin may be helping.  In terms of her cirrhosis care, she was screened for varices by EGD on 03/04/2011, and she has never bled. She would need a repeat EGD in October 2015. She was screened by imaging on 08/25/2011, and would need another ultrasound in October 2013. There is no ascites or encephalopathy to treat. She was previously hepatitis A immune in [place], but negative at [facility] [place]. I previously established, based on this, I would consider her immune. She has also received hepatitis B vaccine through [place] in early 2010-2011. She will need Pneumovax and flu shot through her primary physician, if not already done, as well as annual influenza  vaccine.   In my discussion today with the patient, we discussed her progress to date. We discussed the management of her anemia. I have reassured her that I do not think there is any evidence to suggest active pulmonary infection, but that she is symptomatically anemic.    plan: 1. Continue with weekly CBCs next week. A standing order was placed in EPIC.  2. Hepatitis A immune, hepatitis B vaccination completed 07/05/2009.  3. Ultrasound due by October 2015.  4. EGD to be repeated by October 2015.  5. Pneumovax and flu shot through primary physician.  6. She will return in approximately 2 weeks' time.  7. I have reassured her that her chest symptoms are likely related to anemia, and that she does not need another course of antibiotics for chronic obstructive lung disease.               Brooke Dare, MD   [facility] 10/02/11 CBC today pending.  [phone] .[id number]  D:  Thu May 02 14:28:46 2013 ; T:  Thu May 02 20:41:34 2013  Job #:  [id number]

## 2011-10-03 ENCOUNTER — Other Ambulatory Visit: Payer: Medicare Other | Admitting: Lab

## 2011-10-03 LAB — CBC WITH DIFFERENTIAL/PLATELET
Basophils Absolute: 0 10*3/uL (ref 0.0–0.1)
Eosinophils Absolute: 0 10*3/uL (ref 0.0–0.7)
Eosinophils Relative: 1 % (ref 0–5)
HCT: 25.7 % — ABNORMAL LOW (ref 36.0–46.0)
Lymphocytes Relative: 61 % — ABNORMAL HIGH (ref 12–46)
MCH: 32.5 pg (ref 26.0–34.0)
MCHC: 29.2 g/dL — ABNORMAL LOW (ref 30.0–36.0)
MCV: 111 fL — ABNORMAL HIGH (ref 78.0–100.0)
Monocytes Absolute: 0.1 10*3/uL (ref 0.1–1.0)
Platelets: 24 10*3/uL — CL (ref 150–400)
RDW: 18.4 % — ABNORMAL HIGH (ref 11.5–15.5)
WBC: 1.8 10*3/uL — ABNORMAL LOW (ref 4.0–10.5)

## 2011-10-06 ENCOUNTER — Encounter (HOSPITAL_COMMUNITY): Payer: Self-pay | Admitting: Emergency Medicine

## 2011-10-06 ENCOUNTER — Emergency Department (HOSPITAL_COMMUNITY)
Admission: EM | Admit: 2011-10-06 | Discharge: 2011-10-06 | Disposition: A | Discharge status: Home / Self Care | Payer: Medicare Other | Attending: Emergency Medicine | Admitting: Emergency Medicine

## 2011-10-06 DIAGNOSIS — M542 Cervicalgia: Secondary | ICD-10-CM | POA: Insufficient documentation

## 2011-10-06 DIAGNOSIS — M549 Dorsalgia, unspecified: Secondary | ICD-10-CM | POA: Insufficient documentation
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
## 2011-10-07 ENCOUNTER — Emergency Department (HOSPITAL_COMMUNITY)
Admission: EM | Admit: 2011-10-07 | Discharge: 2011-10-07 | Disposition: A | Discharge status: Home / Self Care | Payer: No Typology Code available for payment source | Attending: Emergency Medicine | Admitting: Emergency Medicine

## 2011-10-07 ENCOUNTER — Encounter (HOSPITAL_COMMUNITY): Payer: Self-pay

## 2011-10-07 DIAGNOSIS — M545 Low back pain, unspecified: Secondary | ICD-10-CM | POA: Insufficient documentation

## 2011-10-07 DIAGNOSIS — F319 Bipolar disorder, unspecified: Secondary | ICD-10-CM | POA: Insufficient documentation

## 2011-10-07 DIAGNOSIS — T148XXA Other injury of unspecified body region, initial encounter: Secondary | ICD-10-CM

## 2011-10-07 DIAGNOSIS — M542 Cervicalgia: Secondary | ICD-10-CM | POA: Insufficient documentation

## 2011-10-07 DIAGNOSIS — M546 Pain in thoracic spine: Secondary | ICD-10-CM | POA: Insufficient documentation

## 2011-10-07 DIAGNOSIS — G894 Chronic pain syndrome: Secondary | ICD-10-CM | POA: Insufficient documentation

## 2011-10-07 DIAGNOSIS — B1920 Unspecified viral hepatitis C without hepatic coma: Secondary | ICD-10-CM | POA: Insufficient documentation

## 2011-10-07 DIAGNOSIS — K219 Gastro-esophageal reflux disease without esophagitis: Secondary | ICD-10-CM | POA: Insufficient documentation

## 2011-10-07 HISTORY — DX: Unspecified osteoarthritis, unspecified site: M19.90

## 2011-10-07 HISTORY — DX: Other psychoactive substance dependence, uncomplicated: F19.20

## 2011-10-07 MED ORDER — IPRATROPIUM BROMIDE 0.02 % IN SOLN
RESPIRATORY_TRACT | Status: AC
  Filled 2011-10-07: qty 2.5

## 2011-10-07 MED ORDER — CYCLOBENZAPRINE HCL 10 MG PO TABS: 10.0000 mg | ORAL_TABLET | Freq: Two times a day (BID) | ORAL | Status: AC | PRN

## 2011-10-07 MED ORDER — ALBUTEROL SULFATE (5 MG/ML) 0.5% IN NEBU
INHALATION_SOLUTION | RESPIRATORY_TRACT | Status: AC
  Filled 2011-10-07: qty 1
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
## 2011-10-07 NOTE — ED Notes (Signed)
Spoke with EDPA Melvenia Beam about breathing TX- wrong pt
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
## 2011-10-08 NOTE — ED Provider Notes (Signed)
Medical screening examination/treatment/procedure(s) were performed by non-physician practitioner and as supervising physician I was immediately available for consultation/collaboration.  Juliet Rude. Rubin Payor, MD 10/08/11 1536

## 2011-10-09 ENCOUNTER — Other Ambulatory Visit: Payer: Self-pay | Admitting: Gastroenterology

## 2011-10-10 LAB — CBC WITH DIFFERENTIAL/PLATELET
Basophils Absolute: 0 10*3/uL (ref 0.0–0.1)
Basophils Relative: 0 % (ref 0–1)
Eosinophils Absolute: 0 10*3/uL (ref 0.0–0.7)
HCT: 25.8 % — ABNORMAL LOW (ref 36.0–46.0)
Hemoglobin: 7.5 g/dL — ABNORMAL LOW (ref 12.0–15.0)
MCH: 33.9 pg (ref 26.0–34.0)
MCHC: 31.6 g/dL (ref 30.0–36.0)
Monocytes Absolute: 0.1 10*3/uL (ref 0.1–1.0)
Monocytes Relative: 4 % (ref 3–12)
Neutro Abs: 0.4 10*3/uL — ABNORMAL LOW (ref 1.7–7.7)
Neutrophils Relative %: 33 % — ABNORMAL LOW (ref 43–77)
RDW: 19 % — ABNORMAL HIGH (ref 11.5–15.5)

## 2011-10-16 ENCOUNTER — Ambulatory Visit (INDEPENDENT_AMBULATORY_CARE_PROVIDER_SITE_OTHER): Payer: Medicare Other | Admitting: Gastroenterology

## 2011-10-16 DIAGNOSIS — B182 Chronic viral hepatitis C: Secondary | ICD-10-CM

## 2011-10-16 LAB — CBC WITH DIFFERENTIAL/PLATELET
Basophils Absolute: 0 10*3/uL (ref 0.0–0.1)
Basophils Relative: 0 % (ref 0–1)
Eosinophils Absolute: 0 10*3/uL (ref 0.0–0.7)
Eosinophils Relative: 1 % (ref 0–5)
HCT: 26.3 % — ABNORMAL LOW (ref 36.0–46.0)
MCHC: 33.1 g/dL (ref 30.0–36.0)
MCV: 104.8 fL — ABNORMAL HIGH (ref 78.0–100.0)
Monocytes Absolute: 0.2 10*3/uL (ref 0.1–1.0)
Platelets: 19 10*3/uL — CL (ref 150–400)
RDW: 16.8 % — ABNORMAL HIGH (ref 11.5–15.5)

## 2011-10-16 LAB — HEPATIC FUNCTION PANEL
ALT: 16 U/L (ref 0–35)
Albumin: 3.4 g/dL — ABNORMAL LOW (ref 3.5–5.2)
Total Protein: 6.8 g/dL (ref 6.0–8.3)

## 2011-10-16 NOTE — Patient Instructions (Signed)
1. Take the Pegasys syringe and push the plunger down to 135 (waste the small amount that comes out), and then inject the 135.  By reducing the Pegasys this will help with the fatigue and anemia 2.  Stop the ribavirin completely as of now until told otherwise 3.  Continue with weekly CBCs 4.  Take the ondansetron for nausea in morning before it gets bad 5.  Return in four weeks

## 2011-10-23 ENCOUNTER — Other Ambulatory Visit: Payer: Self-pay | Admitting: Gastroenterology

## 2011-10-23 NOTE — Progress Notes (Signed)
[name], [name]  MR#:  [id number]      DATE:  10/16/2011  DOB:  [birth year]    cc: Referring Physician: Dineen Kid. Reche Dixon, MD, [address], [place], [place] [address], Fax [phone]   Primary Care Physician: Same   Mia Crater, MD, [facility], [address], [place], [place] [address]   Consulting Physicians: [name], PMHNP-BC, [facility], [place], [address] [place], [place] [address], Fax [phone]   Mia Plummer, MD, [address], [place], [place] [address]   Mia Bolus, MD, [facility] [place], [address] 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   HISTORY: The patient returns today unaccompanied. She complains of significant fatigue as well as nausea. She is not using the ondansetron. She admits that she waits too long before taking anything for nausea. Otherwise, she denies any psychiatric side effects of therapy. There are no symptoms to suggest cryoglobulin mediated or decompensated liver disease.   It will be recalled that she has developed significant anemia on therapy. Therefore, I dose-reduced her ribavirin and added erythropoietin at 60,000 units weekly starting on 09/26/2011. In addition, the patient continues to have weekly CBCs.   PAST MEDICAL HISTORY: No interval change.   CURRENT MEDICATIONS:  1. Boceprevir 800 mg q.8 h. 2. Ribavirin 200 mg daily. 3. Quetiapine 600 mg at bedtime.  4. Pegasys 180 mcg weekly.  5. Methadone 110 mg daily.  6. Adderall 20 mg b.i.d. p.r.n.  7. Ondansetron 8 mg q.8 h. p.r.n.  8. Erythropoietin 60,000 units subcu weekly on Fridays.   ALLERGIES: Denies.   HABITS: Smoking: One quarter pack of cigarettes per day. Alcohol: Denies interval consumption.   REVIEW OF SYSTEMS: All 10 systems reviewed today with the patient and they are negative other which  is mentioned above. Her CES-D was 48.   ALLERGIES: Denies.   PHYSICAL EXAMINATION: Constitutional: Significant pallor. There is no significant peripheral wasting. Vital signs: Height 67 inches, weight 177 pounds, blood pressure 126/73, pulse of 76, temperature 98.1 Fahrenheit.   ASSESSMENT: The patient is a 51 year old woman with a history of genotype 1a hepatitis C, IL28B CC, cirrhosis by imaging and labs. Her commencement date for therapy was 06/20/2011 with Pegasys and ribavirin. She is at approximately week 16 of treatment. Her boceprevir started on 07/16/2011. Her week 4 HCV RNA was 284,470 international units per mL or 5.45 log international units per mL with a baseline of 3.329518 international units per mL, or 6.25 log international units per mL, representing a 0.8 log international unit decline within the first month. Her week 8 HCV RNA was undetectable, despite her compromised dose of ribavirin, as was her week 12 HCV RNA.  She will need another one at week 24. Because she has cirrhosis she will need a full 48 weeks of therapy.   In terms of the anemia, it continues to be a result of the ribavirin principally. She continues to have significant anemia despite the injection of Procrit, which may have only stabilized things. I think, therefore, I have to hold the ribavirin temporarily and drop the Pegasys to 135 from 180. Of course, this may compromise her chance of SVR, but on the other hand she is quite symptomatic from anemia.   In terms of her cirrhosis care, she was screened for varices on 03/04/2011, and she has never bled. Repeat  EGD in 02/2014. She was screened by imaging on 08/25/2011 and needs another ultrasound in 02/2012. There is no ascites or encephalopathy to treat. She was previously hepatitis A immune in [place] and negative at [facility]. I would consider her immune, however. She received hepatitis B vaccine through [place] in early 2010 to 2011. She will need a flu shot and  Pneumovax through her primary physician if not already done.   In my discussion today with the patient, we discussed side effect management. We discussed the continuing use of the erythropoietin while holding the ribavirin altogether, as well as dropping the Pegasys dose until her hemoglobin recovers.   PLAN:  1. Will reduce the Pegasys to 135 mcg weekly from 180.  2. Hold the ribavirin altogether until otherwise told.  3. Continue on boceprevir at full dose, as this cannot be reduced.  4. CBC with differential and liver enzymes today.  5. Encouraged to take ondansetron in the morning before the nausea becomes a problem.  6. She is to continue with weekly CBCs.  7. Repeat imaging of liver in 02/2014.  8. Repeat EGD in 02/2014.  9. Return to clinic in 4 weeks' time for week 20 visit.               Mia Dare, MD   ADDENDUM HgB 8.7  On 10/23/11, I spoke to [name] and she is feeling much better believing this to be because she was off the ribavirin.  A CBC is pending from today.  I told her that she will need to resume ribavirin based on today's CBC even though she was reluctant.  [id number] .[id number]  D:  Thu May 23 21:29:53 2013 ; T:  Fri May 24 12:41:16 2013  Job #:  [id number]

## 2011-10-24 LAB — CBC WITH DIFFERENTIAL/PLATELET
Eosinophils Absolute: 0 10*3/uL (ref 0.0–0.7)
Lymphocytes Relative: 59 % — ABNORMAL HIGH (ref 12–46)
Lymphs Abs: 0.8 10*3/uL (ref 0.7–4.0)
MCH: 34.6 pg — ABNORMAL HIGH (ref 26.0–34.0)
Neutro Abs: 0.4 10*3/uL — ABNORMAL LOW (ref 1.7–7.7)
Neutrophils Relative %: 32 % — ABNORMAL LOW (ref 43–77)
Platelets: 19 10*3/uL — CL (ref 150–400)
RBC: 2.6 MIL/uL — ABNORMAL LOW (ref 3.87–5.11)
WBC: 1.4 10*3/uL — ABNORMAL LOW (ref 4.0–10.5)

## 2011-10-27 ENCOUNTER — Telehealth: Payer: Self-pay | Admitting: Gastroenterology

## 2011-10-27 NOTE — Telephone Encounter (Signed)
Left a message on voicemail.  I would like to resume Ribavirin at 200 mg daily and obtain weekly CBC's as already ordered.

## 2011-11-06 ENCOUNTER — Telehealth: Payer: Self-pay | Admitting: Gastroenterology

## 2011-11-06 ENCOUNTER — Other Ambulatory Visit: Payer: Self-pay | Admitting: Gastroenterology

## 2011-11-06 NOTE — Telephone Encounter (Signed)
Called and left a voicemail at the number to call either [facility] or [place] back.

## 2011-11-07 LAB — CBC WITH DIFFERENTIAL/PLATELET
Basophils Absolute: 0 10*3/uL (ref 0.0–0.1)
HCT: 33.3 % — ABNORMAL LOW (ref 36.0–46.0)
Lymphocytes Relative: 61 % — ABNORMAL HIGH (ref 12–46)
Monocytes Absolute: 0.1 10*3/uL (ref 0.1–1.0)
Neutro Abs: 0.5 10*3/uL — ABNORMAL LOW (ref 1.7–7.7)
RBC: 2.84 MIL/uL — ABNORMAL LOW (ref 3.87–5.11)
RDW: 17.2 % — ABNORMAL HIGH (ref 11.5–15.5)
WBC: 1.7 10*3/uL — ABNORMAL LOW (ref 4.0–10.5)

## 2011-11-13 ENCOUNTER — Ambulatory Visit (INDEPENDENT_AMBULATORY_CARE_PROVIDER_SITE_OTHER): Payer: Medicare Other | Admitting: Gastroenterology

## 2011-11-13 ENCOUNTER — Other Ambulatory Visit: Payer: Self-pay | Admitting: Gastroenterology

## 2011-11-13 DIAGNOSIS — D649 Anemia, unspecified: Secondary | ICD-10-CM

## 2011-11-13 DIAGNOSIS — B182 Chronic viral hepatitis C: Secondary | ICD-10-CM

## 2011-11-13 LAB — CBC WITH DIFFERENTIAL/PLATELET
Hemoglobin: 10.7 g/dL — ABNORMAL LOW (ref 12.0–15.0)
Lymphs Abs: 1 10*3/uL (ref 0.7–4.0)
Monocytes Relative: 8 % (ref 3–12)
Neutro Abs: 0.4 10*3/uL — ABNORMAL LOW (ref 1.7–7.7)
Neutrophils Relative %: 24 % — ABNORMAL LOW (ref 43–77)
RBC: 3.05 MIL/uL — ABNORMAL LOW (ref 3.87–5.11)

## 2011-11-13 LAB — TSH: TSH: 3.255 u[IU]/mL (ref 0.350–4.500)

## 2011-11-13 LAB — HEPATIC FUNCTION PANEL
Bilirubin, Direct: 0.2 mg/dL (ref 0.0–0.3)
Indirect Bilirubin: 0.3 mg/dL (ref 0.0–0.9)

## 2011-11-14 LAB — HEPATITIS C RNA QUANTITATIVE

## 2011-11-20 ENCOUNTER — Other Ambulatory Visit: Payer: Self-pay | Admitting: Gastroenterology

## 2011-11-20 LAB — CBC WITH DIFFERENTIAL/PLATELET
Eosinophils Relative: 1 % (ref 0–5)
HCT: 33.3 % — ABNORMAL LOW (ref 36.0–46.0)
Lymphocytes Relative: 52 % — ABNORMAL HIGH (ref 12–46)
Lymphs Abs: 1 10*3/uL (ref 0.7–4.0)
MCV: 107.1 fL — ABNORMAL HIGH (ref 78.0–100.0)
Monocytes Absolute: 0.3 10*3/uL (ref 0.1–1.0)
Platelets: 15 10*3/uL — CL (ref 150–400)
RBC: 3.11 MIL/uL — ABNORMAL LOW (ref 3.87–5.11)
WBC: 1.8 10*3/uL — ABNORMAL LOW (ref 4.0–10.5)

## 2011-11-20 NOTE — Progress Notes (Signed)
NAMEMARIANE, [name]  MR#:  [id number]      DATE:  11/13/2011  DOB:  1961/02/14    cc: Referring Physician: Dineen Kid. Reche Dixon, MD, [address], [place], [place] [address], Fax [phone]   Primary Care Physician: Same   Lucile Crater, MD, [facility], [address], [place], [place] [address]   Consulting Physicians: Manuela Neptune, PMHNP-BC, [facility] [place], [address] [place], [place] [address], Fax [phone]   Naaman Plummer, MD, [address], [place], [place] [address]   Jethro Bolus, MD, [facility], [address] [place], [place], [place] [address]    REASON FOR VISIT:  Followup of genotype 1a hepatitis C induced cirrhosis, week 22 of therapy.   HISTORY:  The patient returns today unaccompanied. She continues to complain of significant fatigue and nausea. When last seen, I had to discontinue the ribavirin because of significant anemia. My intention was to get her started with erythropoietin at 60,000 units weekly and get her back on ribavirin as soon as possible. I was able to obtain erythropoietin for her, which she started back on 09/26/2011. I have called her on 2 occasions, 10/27/2011 and 11/06/2011 to discuss the results of her CBCs and to get her started back on the ribavirin. She claims she did not receive the first message. She claimed that she could not understand number from the second message, and did not call me back. Today, she reports that she has remained off the ribavirin since last seen on 10/16/2011, and feels better off of it. She continues to take the boceprevir and the pegylated interferon.   As mentioned, she continues to complain of fatigue and depressive symptoms, although she denies any suicidal or homicidal ideation. The patient reports that she is still being seen at [facility] where she is seen every 3 months or so by a therapist. Her next appointment is in approximately  2 months. She reports that she is suppose to be on Adderall and Seroquel, but typically under doses Seroquel because it is very sedating.   PAST MEDICAL HISTORY:  No interval change.   CURRENT MEDICATIONS:  1. Boceprevir 800 mg at bedtime 2. Quetiapine 50 at bedtime.  3. Pegasys 135 mcg weekly. 4. Methadone 110 mg daily.  5. Adderall 20 mg b.i.d. p.r.n.  6. Ondansetron 8 mg every 8 hours p.r.n.  7. Erythropoietin 60,000 units subcu weekly on Fridays.   ALLERGIES:  Denies.   HABITS:  Smoking, quarter of a pack of cigarettes per day. Alcohol denies interval consumption.   REVIEW OF SYSTEMS:  All 10 systems reviewed today with the patient and they are negative other than which is mentioned above. Her CES-D was 60.   PHYSICAL EXAMINATION:  Constitutional: Stated age without significant stigmata of chronic liver disease. Vital signs: Height 67 inches, weight 174, blood pressure 120/80, pulse of 86, temperature 97.8 Fahrenheit.   LABORATORIES: On 11/06/2011, Showed a white count of 1.7, hemoglobin 9.9, MCV 109.8, platelet count of 17,000.   ASSESSMENT:  This is a 51 year old woman with history of genotype 1a hepatitis C, IL28B CC, cirrhosis by imaging and labs. Her commencement date for therapy was 06/20/2011, with Pegasys and ribavirin. She is approximately week 22 of therapy. Her boceprevir started on 07/16/2011. Her week 4 HCV RNA was 284,470 international units per mL or 5.45 log international units per mL with a baseline of 4,742595 international units per mL, or 6.25 log international units per  mL, representing 0.8 log decline on the first month of therapy.  Week 8 HCV RNA was undetectable, though her dosing of ribavirin was originally compromised. Her week 12 HCV RNA was also undetectable. She is due for week 24 HCV RNA, but now that she has been off the ribavirin for several weeks, and she is here today, I will do the week 24 HCV RNA at week 22. Ideally because of cirrhosis, she would   require a full 48 weeks of therapy; however, she is not tolerating therapy, and she may need to discontinue this.   In terms of anemia, this is most likely on the basis of ribavirin induction, which she would benefit from erythropoietin, which has been ordered. In terms of her cirrhosis care, she has been screened for varices on 03/04/2011, and has never bled. Repeat EGD is called for and in October 2015. She was screened by imaging on 08/25/2011, and needs another ultrasound in October 2013. There is no ascites or encephalopathy to treat. She was previously hepatitis A immune in [place], and negative at [facility]. I would consider her hepatitis A immune. She received hepatitis B vaccine through [place] in early 2010 and 2011. She will need a flu shot and Pneumovax through her primary physician if not already done.   In my discussion today with the patient, we discussed discontinuing all therapy as she has compromised her treatment with elimination of the ribavirin. I have explained why ribavirin would be important to continue. She was reluctant to stop the boceprevir and the interferon. We agreed that we would recheck her HCV RNA today, and if it was positive, she would discontinue all therapies. She also discussed the fact that she is thinking about moving to [place], [place] to rejoin other family. I have explained to her that if she is to do so, she would need to be sure that she arrange for followup in a hepatology, such as [facility] for long term management.   PLAN:  1. CBC, TSH, and HCV RNA today.  2. She continues to hold the ribavirin despite my advice to resume it.  3. Continue with boceprevir at full dose.  4. If her HCV RNA is positive, then will contact her to discontinue  therapy.  5. If the HCV RNA is negative, as I said, she will need a full 48 weeks of therapy and I will impress upon her the need to restart the ribavirin.  6. She will need a repeat EGD in October 2015.    7. She will need a repeat ultrasound October 2015.  8. She is to return in approximately 4 weeks' time in followup.               Brooke Dare, MD   ADDENDUM:  HCV RNA undetectable.  CBC HgB coming up.  I called Ms Weich on 11/20/11, and told her she must restart the ribavirin.  She agreed to start 200 mg daily.  With that I do not want to reduce the Procrit dose.  She will present for labs on 11/28/11.  [phone] .[id number]  D:  Thu Jun 20 14:39:19 2013 ; T:  Thu Jun 20 21:58:09 2013  Job #:  [id number]

## 2011-11-25 ENCOUNTER — Other Ambulatory Visit: Payer: Self-pay | Admitting: Physician Assistant

## 2011-11-25 ENCOUNTER — Ambulatory Visit
Admission: RE | Admit: 2011-11-25 | Discharge: 2011-11-25 | Disposition: A | Discharge status: Home / Self Care | Payer: Medicare Other | Source: Ambulatory Visit | Attending: Physician Assistant | Admitting: Physician Assistant

## 2011-11-25 DIAGNOSIS — R05 Cough: Secondary | ICD-10-CM

## 2011-12-02 ENCOUNTER — Other Ambulatory Visit: Payer: Self-pay | Admitting: Gastroenterology

## 2011-12-02 LAB — CBC WITH DIFFERENTIAL/PLATELET
Basophils Relative: 0 % (ref 0–1)
HCT: 37.7 % (ref 36.0–46.0)
Hemoglobin: 11.5 g/dL — ABNORMAL LOW (ref 12.0–15.0)
Lymphs Abs: 0.9 10*3/uL (ref 0.7–4.0)
MCHC: 30.5 g/dL (ref 30.0–36.0)
Monocytes Absolute: 0.2 10*3/uL (ref 0.1–1.0)
Monocytes Relative: 9 % (ref 3–12)
Neutro Abs: 0.8 10*3/uL — ABNORMAL LOW (ref 1.7–7.7)
RBC: 3.26 MIL/uL — ABNORMAL LOW (ref 3.87–5.11)

## 2011-12-18 ENCOUNTER — Ambulatory Visit: Payer: Medicare Other | Admitting: Gastroenterology

## 2011-12-19 ENCOUNTER — Emergency Department (HOSPITAL_COMMUNITY): Payer: No Typology Code available for payment source

## 2011-12-19 ENCOUNTER — Emergency Department (HOSPITAL_COMMUNITY)
Admission: EM | Admit: 2011-12-19 | Discharge: 2011-12-19 | Disposition: A | Discharge status: Home / Self Care | Payer: No Typology Code available for payment source | Attending: Emergency Medicine | Admitting: Emergency Medicine

## 2011-12-19 DIAGNOSIS — M1990 Unspecified osteoarthritis, unspecified site: Secondary | ICD-10-CM | POA: Insufficient documentation

## 2011-12-19 DIAGNOSIS — Z043 Encounter for examination and observation following other accident: Secondary | ICD-10-CM | POA: Insufficient documentation

## 2011-12-19 DIAGNOSIS — B1920 Unspecified viral hepatitis C without hepatic coma: Secondary | ICD-10-CM | POA: Insufficient documentation

## 2011-12-19 DIAGNOSIS — G894 Chronic pain syndrome: Secondary | ICD-10-CM | POA: Insufficient documentation

## 2011-12-19 DIAGNOSIS — F17200 Nicotine dependence, unspecified, uncomplicated: Secondary | ICD-10-CM | POA: Insufficient documentation

## 2011-12-19 DIAGNOSIS — F319 Bipolar disorder, unspecified: Secondary | ICD-10-CM | POA: Insufficient documentation
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
## 2011-12-19 NOTE — ED Provider Notes (Signed)
Medical screening examination/treatment/procedure(s) were performed by non-physician practitioner and as supervising physician I was immediately available for consultation/collaboration.  Cheri Guppy, MD 12/19/11 [phone]

## 2011-12-25 ENCOUNTER — Ambulatory Visit (INDEPENDENT_AMBULATORY_CARE_PROVIDER_SITE_OTHER): Payer: Medicare Other | Admitting: Gastroenterology

## 2011-12-25 DIAGNOSIS — B182 Chronic viral hepatitis C: Secondary | ICD-10-CM

## 2011-12-25 DIAGNOSIS — D649 Anemia, unspecified: Secondary | ICD-10-CM

## 2011-12-25 DIAGNOSIS — K7460 Unspecified cirrhosis of liver: Secondary | ICD-10-CM

## 2011-12-25 LAB — CBC WITH DIFFERENTIAL/PLATELET
Basophils Relative: 0 % (ref 0–1)
HCT: 40.4 % (ref 36.0–46.0)
Hemoglobin: 12.5 g/dL (ref 12.0–15.0)
Lymphs Abs: 0.9 10*3/uL (ref 0.7–4.0)
MCH: 35.4 pg — ABNORMAL HIGH (ref 26.0–34.0)
MCHC: 30.9 g/dL (ref 30.0–36.0)
Monocytes Absolute: 0.2 10*3/uL (ref 0.1–1.0)
Monocytes Relative: 11 % (ref 3–12)
Neutro Abs: 1 10*3/uL — ABNORMAL LOW (ref 1.7–7.7)
RBC: 3.53 MIL/uL — ABNORMAL LOW (ref 3.87–5.11)

## 2011-12-25 LAB — HEPATIC FUNCTION PANEL
ALT: 24 U/L (ref 0–35)
AST: 55 U/L — ABNORMAL HIGH (ref 0–37)
Bilirubin, Direct: 0.3 mg/dL (ref 0.0–0.3)
Indirect Bilirubin: 0.4 mg/dL (ref 0.0–0.9)

## 2011-12-30 LAB — HEPATITIS C RNA QUANTITATIVE
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
## 2012-01-09 ENCOUNTER — Other Ambulatory Visit: Payer: Self-pay | Admitting: Gastroenterology

## 2012-01-09 LAB — CBC WITH DIFFERENTIAL/PLATELET
Basophils Absolute: 0 10*3/uL (ref 0.0–0.1)
Eosinophils Relative: 2 % (ref 0–5)
HCT: 38.8 % (ref 36.0–46.0)
Hemoglobin: 13 g/dL (ref 12.0–15.0)
Lymphocytes Relative: 58 % — ABNORMAL HIGH (ref 12–46)
Lymphs Abs: 1 10*3/uL (ref 0.7–4.0)
MCV: 105.7 fL — ABNORMAL HIGH (ref 78.0–100.0)
Monocytes Absolute: 0.2 10*3/uL (ref 0.1–1.0)
Monocytes Relative: 12 % (ref 3–12)
Neutro Abs: 0.5 10*3/uL — ABNORMAL LOW (ref 1.7–7.7)
RDW: 15 % (ref 11.5–15.5)
WBC: 1.8 10*3/uL — ABNORMAL LOW (ref 4.0–10.5)

## 2012-01-11 ENCOUNTER — Ambulatory Visit (INDEPENDENT_AMBULATORY_CARE_PROVIDER_SITE_OTHER): Payer: Medicare Other | Admitting: Emergency Medicine

## 2012-01-11 ENCOUNTER — Ambulatory Visit: Payer: Medicare Other

## 2012-01-11 VITALS — HR 84 | Temp 98.0°F | Resp 17 | Ht 66.0 in | Wt 176.0 lb

## 2012-01-11 DIAGNOSIS — B1920 Unspecified viral hepatitis C without hepatic coma: Secondary | ICD-10-CM

## 2012-01-11 DIAGNOSIS — R05 Cough: Secondary | ICD-10-CM

## 2012-01-11 DIAGNOSIS — J40 Bronchitis, not specified as acute or chronic: Secondary | ICD-10-CM

## 2012-01-11 DIAGNOSIS — R059 Cough, unspecified: Secondary | ICD-10-CM

## 2012-01-11 DIAGNOSIS — F17200 Nicotine dependence, unspecified, uncomplicated: Secondary | ICD-10-CM

## 2012-01-11 MED ORDER — CEFDINIR 300 MG PO CAPS: 600.0000 mg | ORAL_CAPSULE | Freq: Every day | ORAL | Status: AC

## 2012-01-11 NOTE — Progress Notes (Signed)
  Subjective:    Patient ID: [name], female    DOB: 01-22-1961, 51 y.o.   MRN: [id number]  HPI patient presents with a rather complicated history. She has a history of hepatitis C and is under treatment by Dr. Scot Jun with interferon. She had trouble 2 months ago with a productive cough and was treated by her regular physician with amoxicillin. Apparently she had a chest x-ray but does not know the results the she then received treatment with another course of antibiotics but is unsure what that was . She enters today with persistent productive cough chest tightness. She states she had an albuterol treatment while at the hospital and "had a seizure.     Review of Systems     Objective:   Physical Exam HEENT exam is remarkable for scarring of both TMs from previous tubes. The neck is supple. Chest exam reveals rhonchi present over the right anterior and posterior chest. There is good air exchange.        Assessment & Plan:

## 2012-01-11 NOTE — Patient Instructions (Addendum)

## 2012-01-11 NOTE — Progress Notes (Signed)
  Subjective:    Patient ID: [name], female    DOB: Feb 17, 1961, 51 y.o.   MRN: [id number]  HPI Patient reports seeing Dr. Scot Jun ([address] originally for URI. Pt had CXR about 6 weeks ago and was given ABX. I called pharmacy to verify ABX's. Pt was given Augmentin by Dr. Lerry Liner which did not help. Pt followed up with Dr. Velda Shell about 3 weeks later and he switched her to Clarithromycin 500mg  BID * 7 days. Pt states she felt about 75% better but the cough has never resolved Pt does noes not use an inhaler. Pt states she is wheezing at night. Pt states she has had a seizure due to an albuterol nebulizer and she was hospitalized for it.    Review of Systems     Objective:   Physical Exam HEENT exam is unremarkable. Neck is supple. Chest exam reveals rhonchi on the right anterior and posterior chest. Cardiac exam is unremarkable.  [facility] reading (PRIMARY) by  Dr Cleta Alberts no acute disease         Assessment & Plan:   We'll treat with Omnicef 300 twice a day x10 days and have her take Mucinex twice a day for 10 days advised her to quit smoking

## 2012-01-22 ENCOUNTER — Ambulatory Visit: Payer: Medicare Other | Admitting: Gastroenterology

## 2012-01-29 ENCOUNTER — Other Ambulatory Visit: Payer: Medicare Other | Admitting: Lab

## 2012-01-29 ENCOUNTER — Ambulatory Visit: Payer: Medicare Other | Admitting: Oncology

## 2012-02-01 NOTE — Progress Notes (Signed)
Reschedule prn. 

## 2012-02-02 ENCOUNTER — Telehealth: Payer: Self-pay | Admitting: Oncology

## 2012-02-02 NOTE — Telephone Encounter (Signed)
called to r/s the 9/5 missed appt    aom

## 2012-02-05 ENCOUNTER — Ambulatory Visit (INDEPENDENT_AMBULATORY_CARE_PROVIDER_SITE_OTHER): Payer: Medicare Other | Admitting: Gastroenterology

## 2012-02-05 DIAGNOSIS — B182 Chronic viral hepatitis C: Secondary | ICD-10-CM

## 2012-02-05 DIAGNOSIS — D649 Anemia, unspecified: Secondary | ICD-10-CM

## 2012-02-05 LAB — CBC WITH DIFFERENTIAL/PLATELET
Basophils Absolute: 0 10*3/uL (ref 0.0–0.1)
Basophils Relative: 0 % (ref 0–1)
Eosinophils Relative: 2 % (ref 0–5)
HCT: 43.1 % (ref 36.0–46.0)
Hemoglobin: 14.8 g/dL (ref 12.0–15.0)
MCH: 36.2 pg — ABNORMAL HIGH (ref 26.0–34.0)
MCHC: 34.3 g/dL (ref 30.0–36.0)
MCV: 105.4 fL — ABNORMAL HIGH (ref 78.0–100.0)
Monocytes Absolute: 0.3 10*3/uL (ref 0.1–1.0)
Monocytes Relative: 8 % (ref 3–12)
Neutro Abs: 2.4 10*3/uL (ref 1.7–7.7)
RDW: 15 % (ref 11.5–15.5)

## 2012-02-05 LAB — HEPATIC FUNCTION PANEL
ALT: 28 U/L (ref 0–35)
AST: 52 U/L — ABNORMAL HIGH (ref 0–37)
Alkaline Phosphatase: 91 U/L (ref 39–117)
Bilirubin, Direct: 0.3 mg/dL (ref 0.0–0.3)
Indirect Bilirubin: 0.6 mg/dL (ref 0.0–0.9)
Total Protein: 7.3 g/dL (ref 6.0–8.3)

## 2012-02-12 ENCOUNTER — Other Ambulatory Visit: Payer: Self-pay | Admitting: Lab

## 2012-02-12 ENCOUNTER — Ambulatory Visit: Payer: Medicare Other | Admitting: Oncology

## 2012-02-12 NOTE — Progress Notes (Signed)
NAMETRANIECE, BOFFA    MR#:  [id number]      DATE:  02/05/2012  DOB:  11/02/1958    cc:     [facility] MEDICAL NUMBER: [id number].   REFERRING PHYSICIAN: Dineen Kid. Reche Dixon, MD, [place], [address] [place], [place] [address], fax [phone].   PRIMARY CARE PHYSICIAN: Lerry Liner, MD, [facility], [address] [place], [place] [address], phone [phone].   CONSULTING PHYSICIANS: Lucile Crater, MD, [facility], [address], [place], [place] [address].   Manuela Neptune, PMHNP-BC, [facility] [place], [address] [place], [place] [address], fax [phone].   Charna Elizabeth, MD, [address], [address] [place], [place], [place] [address].  Jethro Bolus, MD, [facility], [address] [place], [place], [place] [address].    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   history of present illness: The patient returns today unaccompanied. She returns today stating that she had to go to [place] to care for her sister, who is undergoing treatment for cancer. She ran out of her Pegasys, ribavirin and boceprevir 3 days ago, primarily because she was in [place] and she could not be reached by the pharmacy to confirm her address and ship out her medication. She feels better in the last 3 days off medications. There are no symptoms to suggest decompensated or cryoglobulin mediated disease. She mentions concern about depression but she reports that it is primarily because she is lonely rather than feeling depressed and certainly there is no suicidal ideation. It will be recalled that she had significant nausea with ribavirin to the point where she was reluctant to take it. This is significantly better off her medications. She is concerned about restarting the medications because of the concern of the ribavirin.    PAST MEDICAL HISTORY:  No interval change.   CURRENT MEDICATIONS:    1. Quetiapine 50 mg at bedtime.  2. Methadone 110 mg daily.  3. Adderall 20 mg b.i.d. p.r.n.  4. Ondansetron 8 mg p.r.n.  5. Phenergan 25 mg every 6 hours p.r.n.  6. Erythropoietin 60,000 units Fridays.  7. She is supposed to be on ribavirin 200 mg b.i.d. and Boceprevir 800 mg every 8 hours and Pegasys 135 mcg weekly.   ALLERGIES:  Denies.   HABITS:  Smoking one third a pack of cigarettes per day. Alcohol denies interval consumption.   REVIEW OF SYSTEMS:  All 10 systems reviewed today with the patient and they are negative other than which was mentioned above. CES-D was 40.   PHYSICAL EXAMINATION:  Constitutional: Stated age, without significant peripheral wasting.  Vital signs: Height 72 inches, weight 179 pounds, blood pressure 130/90, pulse 78, temperature 98.0 Fahrenheit.   LABORATORIES:  Laboratory studies 01/09/2012. Her CBC revealed a white count of 1.8, hemoglobin 13, MCV 105.7, platelet count 25,000.   ASSESSMENT:  The patient is a 51 year old woman with history of genotype 1A hepatitis C, IL28B CC, with cirrhosis by imaging and labs. Her commencement date for therapy was 06/20/2011 with Pegasys and ribavirin. She is approximately week 32 of therapy. Her boceprevir started on 07/16/2011. Her week 4 HCV RNA was 284,470 international units per mL or 5.45 log international units per mL from a baseline pretreatment of 1,660,630 international units per mL or 6.25 log international units per mL for an approximately 0.8 log international units decline in viral load on the first month of therapy. Her week 8 HCV RNA  was undetectable, though the original ribavirin dosing was compromised  A week 12 HCV RNA was also undetectable. Her week 24 HCV RNA was undetectable as well, though she had discontinued the ribavirin because of nausea. Managed to get her back on ribavirin again; however, I suspect that she particularly enjoyed the drug holiday in the last 3 days because of significant side  effects.  Though it would probably be her inclination to stop therapy at this point and indeed she reported when the pharmacy contacted her today she wanted to talk to me before agreeing to accept further medications, considering that she has come this far with the virologic response that she has had and she only has 16 more weeks to go, I would like to continue therapy as long as possible to 48 weeks.   Her anemia has responded to a combination of erythropoietin and ribavirin dose reduction.   Regarding her cirrhosis care, she was screened for varices on 03/04/2011 and has never bled. She needs a repeat endoscopy around 02/2014. Her last imaging of the liver was 08/25/2011. She needs another ultrasound by 02/2012. Because this clinic is closing, I would rather have this done this month. There is no ascites or encephalopathy to treat. She has previously been hepatitis A immune in [place] and negative at [facility]. I have considered her to be A immune. She received hepatitis B vaccines in [place] in early 2010 and 2011. She will need Pneumovax and flu vaccines locally through her primary care physician.  In my discussion today with the patient, which took the majority of the clinic appointment, we discussed her progress to date. I have explained to her that because she is virologically negative, it would be a shame to waste this opportunity to finish off therapy. I have asked her to go back on up to 600 mg daily ribavirin. She agreed to gradually go from 200 mg to 200 b.i.d. to 400 a.m., 200 p.m. of ribavirin while resuming the Pegasys and the boceprevir.   PLAN:  1. CBC, TSH, HCV RNA and liver enzymes today.  2. Repeat EGD in 02/2014.  3. Hepatitis A immune, hepatitis B vaccination completed.  4. Resume Pegasys at 135 mcg weekly, Ribavirin at 200 mg daily to build up to 600 mg daily, and Boceprevir 800 mg every 8 hours.  5. Ultrasound this month in [place].  6. Smoking cessation reinforced.   7. Return in 4 weeks' time in [place].               Brooke Dare, MD   ADDENDUM Viral load 458 IU/mL, which indicates a breakthrough on therapy, likely due to underdosing of meds, and grounds for stopping.  I have called her and left a voicemail to stop treatment and keep her next appointment.  [phone]  D:  Thu Sep 12 16:59:44 2013 ; T:  Fri Sep 13 08:44:14 2013  Job #:  [id number]

## 2012-02-16 ENCOUNTER — Other Ambulatory Visit (HOSPITAL_COMMUNITY): Payer: Medicare Other

## 2012-02-19 ENCOUNTER — Other Ambulatory Visit: Payer: Self-pay | Admitting: Gastroenterology

## 2012-02-19 LAB — CBC WITH DIFFERENTIAL/PLATELET
Eosinophils Absolute: 0.1 10*3/uL (ref 0.0–0.7)
Eosinophils Relative: 3 % (ref 0–5)
HCT: 46.3 % — ABNORMAL HIGH (ref 36.0–46.0)
Lymphocytes Relative: 33 % (ref 12–46)
Lymphs Abs: 1.1 10*3/uL (ref 0.7–4.0)
MCH: 36 pg — ABNORMAL HIGH (ref 26.0–34.0)
MCV: 108.9 fL — ABNORMAL HIGH (ref 78.0–100.0)
Monocytes Absolute: 0.2 10*3/uL (ref 0.1–1.0)
Platelets: 60 10*3/uL — ABNORMAL LOW (ref 150–400)
RDW: 14 % (ref 11.5–15.5)
WBC: 3.2 10*3/uL — ABNORMAL LOW (ref 4.0–10.5)

## 2012-02-23 ENCOUNTER — Other Ambulatory Visit: Payer: Self-pay | Admitting: Gastroenterology

## 2012-02-23 ENCOUNTER — Ambulatory Visit (HOSPITAL_COMMUNITY): Payer: Medicare Other

## 2012-02-23 DIAGNOSIS — K739 Chronic hepatitis, unspecified: Secondary | ICD-10-CM

## 2012-02-24 ENCOUNTER — Ambulatory Visit (HOSPITAL_COMMUNITY)
Admission: RE | Admit: 2012-02-24 | Discharge: 2012-02-24 | Disposition: A | Discharge status: Home / Self Care | Payer: Medicare Other | Source: Ambulatory Visit | Attending: Gastroenterology | Admitting: Gastroenterology

## 2012-02-24 DIAGNOSIS — Z9089 Acquired absence of other organs: Secondary | ICD-10-CM | POA: Insufficient documentation

## 2012-02-24 DIAGNOSIS — K7460 Unspecified cirrhosis of liver: Secondary | ICD-10-CM | POA: Insufficient documentation

## 2012-02-24 DIAGNOSIS — K739 Chronic hepatitis, unspecified: Secondary | ICD-10-CM

## 2012-05-10 ENCOUNTER — Other Ambulatory Visit: Payer: Self-pay | Admitting: Oncology

## 2012-05-11 ENCOUNTER — Telehealth: Payer: Self-pay | Admitting: Oncology

## 2012-05-11 NOTE — Telephone Encounter (Signed)
s.w. pt and advised on 12.30.13 appt...pt OK and aware

## 2012-05-24 ENCOUNTER — Ambulatory Visit (HOSPITAL_BASED_OUTPATIENT_CLINIC_OR_DEPARTMENT_OTHER): Payer: Medicare Other | Admitting: Oncology

## 2012-05-24 ENCOUNTER — Encounter: Payer: Self-pay | Admitting: Oncology

## 2012-05-24 ENCOUNTER — Other Ambulatory Visit (HOSPITAL_BASED_OUTPATIENT_CLINIC_OR_DEPARTMENT_OTHER): Payer: Medicare Other | Admitting: Lab

## 2012-05-24 ENCOUNTER — Ambulatory Visit: Payer: Self-pay | Admitting: Oncology

## 2012-05-24 ENCOUNTER — Telehealth: Payer: Self-pay | Admitting: Oncology

## 2012-05-24 ENCOUNTER — Other Ambulatory Visit: Payer: Self-pay | Admitting: Lab

## 2012-05-24 VITALS — BP 140/81 | HR 78 | Temp 98.1°F | Resp 20 | Ht 66.93 in | Wt 194.1 lb

## 2012-05-24 DIAGNOSIS — K7460 Unspecified cirrhosis of liver: Secondary | ICD-10-CM

## 2012-05-24 DIAGNOSIS — R0789 Other chest pain: Secondary | ICD-10-CM

## 2012-05-24 DIAGNOSIS — D696 Thrombocytopenia, unspecified: Secondary | ICD-10-CM

## 2012-05-24 DIAGNOSIS — B1920 Unspecified viral hepatitis C without hepatic coma: Secondary | ICD-10-CM

## 2012-05-24 DIAGNOSIS — R5381 Other malaise: Secondary | ICD-10-CM

## 2012-05-24 DIAGNOSIS — D61818 Other pancytopenia: Secondary | ICD-10-CM

## 2012-05-24 DIAGNOSIS — R5383 Other fatigue: Secondary | ICD-10-CM

## 2012-05-24 DIAGNOSIS — D72819 Decreased white blood cell count, unspecified: Secondary | ICD-10-CM

## 2012-05-24 LAB — CBC WITH DIFFERENTIAL/PLATELET
BASO%: 0.2 % (ref 0.0–2.0)
Basophils Absolute: 0 10*3/uL (ref 0.0–0.1)
EOS%: 1.3 % (ref 0.0–7.0)
HCT: 37.5 % (ref 34.8–46.6)
HGB: 13.1 g/dL (ref 11.6–15.9)
LYMPH%: 31.2 % (ref 14.0–49.7)
MCH: 35.4 pg — ABNORMAL HIGH (ref 25.1–34.0)
MCHC: 35 g/dL (ref 31.5–36.0)
MCV: 101.1 fL — ABNORMAL HIGH (ref 79.5–101.0)
MONO%: 8.2 % (ref 0.0–14.0)
NEUT%: 59.1 % (ref 38.4–76.8)

## 2012-05-24 LAB — COMPREHENSIVE METABOLIC PANEL (CC13)
ALT: 43 U/L (ref 0–55)
AST: 53 U/L — ABNORMAL HIGH (ref 5–34)
Alkaline Phosphatase: 71 U/L (ref 40–150)
BUN: 9 mg/dL (ref 7.0–26.0)
Calcium: 9.1 mg/dL (ref 8.4–10.4)
Creatinine: 0.9 mg/dL (ref 0.6–1.1)
Total Bilirubin: 0.9 mg/dL (ref 0.20–1.20)

## 2012-05-24 NOTE — Telephone Encounter (Signed)
Gave patient appt for April, June and December 2014 labs and MD

## 2012-05-24 NOTE — Progress Notes (Signed)
[facility] OFFICE PROGRESS NOTE  Cc:  Jearld Lesch, MD  DIAGNOSIS:  Hepatitis C-induced pancytopenia.   CURRENT THERAPY: pending start of treatment for HepC.   INTERVAL HISTORY: [name] 51 y.o. female returns for regular follow up. The patient states that she took treatment for Hepatitis C, but stopped several months ago. States she no longer has Hep C. She has been under lots of stress of needing to take care of her grandchildren. Remains on Seroquel. She denies suicidal/homocidal ideation.  Beside Methadone, she is not taking any other pain medication.  She still smokes and is not ready to quit at this time.  She could not tolerate nicotine patch or Chantek.  She denies jaundice, fever, mucositis, abdominal pain, abdominal swelling, bleeding sx. Reports that her hair has been falling out since stopping treatment for Hep C. She is also gaining weight.   MEDICAL HISTORY: Past Medical History  Diagnosis Date  . Hepatitis C infection   . Chronic pain syndrome   . Cervical radiculopathy   . Osteoarthritis   . Bipolar affective disorder   . GERD (gastroesophageal reflux disease)   . Thrombocytopenia   . Leukocytopenia   . Peptic ulcer disease   . Pneumonia   . DJD (degenerative joint disease)   . Polysubstance (excluding opioids) dependence     SURGICAL HISTORY:  Past Surgical History  Procedure Date  . Tonsilectomy, adenoidectomy, bilateral myringotomy and tubes   . Right knee arthroscopic surgery   . Total hip replacement   . Appendectomy   . Hyperectomy     due to obstructive ovarian cytst    MEDICATIONS: Current Outpatient Prescriptions  Medication Sig Dispense Refill  . amphetamine-dextroamphetamine (ADDERALL) 20 MG tablet Take 20 mg by mouth 2 (two) times daily as needed. For add      . calcium carbonate (TUMS - DOSED IN MG ELEMENTAL CALCIUM) 500 MG chewable tablet Chew 1-2 tablets by mouth as needed. FOR HEARTBURN      . methadone (DOLOPHINE) 10  MG/ML solution Take 100 mg by mouth daily.       . QUEtiapine (SEROQUEL) 300 MG tablet Take 200 mg by mouth at bedtime.         ALLERGIES:  is allergic to albuterol and aspirin.  REVIEW OF SYSTEMS:  The rest of the 14-point review of system was negative.   Filed Vitals:   05/24/12 1435  BP: 140/81  Pulse: 78  Temp: 98.1 F (36.7 C)  Resp: 20   Wt Readings from Last 3 Encounters:  05/24/12 194 lb 1.6 oz (88.043 kg)  01/11/12 176 lb (79.833 kg)  10/07/11 174 lb (78.926 kg)   ECOG Performance status: 1  PHYSICAL EXAMINATION:   General:  well-nourished in no acute distress.  Eyes:  no scleral icterus.  ENT:  There were no oropharyngeal lesions.  Neck was without thyromegaly.  Lymphatics:  Negative cervical, supraclavicular or axillary adenopathy.  Respiratory: lungs were clear bilaterally without wheezing or crackles.  Cardiovascular:  Regular rate and rhythm, S1/S2, without murmur, rub or gallop.  There was no pedal edema.  GI:  abdomen was soft, flat, nontender, nondistended, without organomegaly.  Muscoloskeletal:  no spinal tenderness of palpation of vertebral spine.  Skin exam was without echymosis, petichae.  Neuro exam was nonfocal.  Patient was able to get on and off exam table without assistance.  Gait was normal.  Patient was alerted and oriented.  Attention was good.   Language was appropriate.  Mood was  normal without depression.  Speech was not pressured.  Thought content was not tangential.    LABORATORY/RADIOLOGY DATA:  Lab Results  Component Value Date   WBC 4.5 05/24/2012   HGB 13.1 05/24/2012   HCT 37.5 05/24/2012   PLT 62* 05/24/2012   GLUCOSE 108* 05/24/2012   CHOL  Value: 139        ATP III CLASSIFICATION:  <200     mg/dL   Desirable  161-096  mg/dL   Borderline High  >=045    mg/dL   High        40/01/8118   TRIG 302* 03/01/2010   HDL 31* 03/01/2010   LDLCALC  Value: 48        Total Cholesterol/HDL:CHD Risk Coronary Heart Disease Risk Table                      Men   Women  1/2 Average Risk   3.4   3.3  Average Risk       5.0   4.4  2 X Average Risk   9.6   7.1  3 X Average Risk  23.4   11.0        Use the calculated Patient Ratio above and the CHD Risk Table to determine the patient's CHD Risk.        ATP III CLASSIFICATION (LDL):  <100     mg/dL   Optimal  147-829  mg/dL   Near or Above                    Optimal  130-159  mg/dL   Borderline  562-130  mg/dL   High  >865     mg/dL   Very High 78/08/6960   ALT 43 05/24/2012   AST 53* 05/24/2012   NA 141 05/24/2012   K 4.0 05/24/2012   CL 105 05/24/2012   CREATININE 0.9 05/24/2012   BUN 9.0 05/24/2012   CO2 28 05/24/2012   TSH 3.530 02/05/2012   INR 1.14 09/03/2011    ASSESSMENT AND PLAN:  1. Depression:  On Seroquel and therapy.  Mood controlled today.  2.  HepC:  She has completed treatment for Hep C. Per notes from Dr Jacqualine Mau, it appears as though treatment was stopped due to viral breakthrough. She has not followed up with them since stopping treatment and I have encouraged her to do so.   3.  Cirrhosis:  Compensated.  There is no evidence of encephalopathy, GI bleed, or ascites.  She no longer drinks EtOH.  4.  Pancytopenia:  From cirrhosis, HepC.  Stable without sign of bleeding.  Past BM bx was negative.  Continue observation.  5. Fatigue/Hair loss: Could be due to stress versus underactive thyroid. Will check TSH today. I have encouraged her to see PCP for ongoing follow-up.  6.  Follow up:  CBC in 4 and 8 months. Visit in one year.   The length of time of the face-to-face encounter was 15  minutes. More than 50% of time was spent counseling and coordination of care.

## 2012-06-04 ENCOUNTER — Ambulatory Visit: Payer: Medicare Other | Admitting: Oncology

## 2012-06-04 ENCOUNTER — Other Ambulatory Visit: Payer: Medicare Other | Admitting: Lab

## 2012-06-25 ENCOUNTER — Telehealth: Payer: Self-pay | Admitting: *Deleted

## 2012-06-25 NOTE — Telephone Encounter (Signed)
Patient calling to inquire re: her labs. Per dr Gaylyn Rong she is to call her liver dr for results of hepatitis C diagnosis.

## 2012-06-30 ENCOUNTER — Ambulatory Visit: Payer: Medicare Other

## 2012-06-30 ENCOUNTER — Ambulatory Visit (INDEPENDENT_AMBULATORY_CARE_PROVIDER_SITE_OTHER): Payer: Medicare Other | Admitting: Family Medicine

## 2012-06-30 VITALS — BP 150/80 | HR 88 | Temp 98.6°F | Resp 18 | Wt 201.0 lb

## 2012-06-30 DIAGNOSIS — M25552 Pain in left hip: Secondary | ICD-10-CM

## 2012-06-30 DIAGNOSIS — M25559 Pain in unspecified hip: Secondary | ICD-10-CM

## 2012-06-30 DIAGNOSIS — T148XXA Other injury of unspecified body region, initial encounter: Secondary | ICD-10-CM

## 2012-06-30 MED ORDER — OXYCODONE-ACETAMINOPHEN 5-325 MG PO TABS: 1.0000 | ORAL_TABLET | Freq: Three times a day (TID) | ORAL | Status: DC | PRN

## 2012-06-30 NOTE — Progress Notes (Signed)
[facility]:  Office Visit  Chief Complaint:  Chief Complaint  Patient presents with  . Hip Pain    left    HPI: [name] is a 52 y.o. female who complains of  Left hip pain after falling over her pomeranion's cage at home this weekend, Saturday. Has 10/10 sharp pain from left hip joint radiating to left anterior thigh.+ constant ache. Has been walking on it. H/o right hip replacement. She states it feels the same as it did when she had her right hip fracture which they were not able to see on xray and had to get an MRI. She does not have pain on the right side of her hip with the replacement. H/o arthritis, osteoporosis. She has not been able to move without pain, worse since she has been helping her neighbor who has breathing issues. Denies any weaknee, numbness, tingling.   Past Medical History  Diagnosis Date  . Hepatitis C infection   . Chronic pain syndrome   . Cervical radiculopathy   . Osteoarthritis   . Bipolar affective disorder   . GERD (gastroesophageal reflux disease)   . Thrombocytopenia   . Leukocytopenia   . Peptic ulcer disease   . Pneumonia   . DJD (degenerative joint disease)   . Polysubstance (excluding opioids) dependence   . Anxiety   . Blood transfusion without reported diagnosis   . Osteoporosis    Past Surgical History  Procedure Date  . Tonsilectomy, adenoidectomy, bilateral myringotomy and tubes   . Right knee arthroscopic surgery   . Total hip replacement   . Appendectomy   . Hyperectomy     due to obstructive ovarian cytst  . Joint replacement   . Spine surgery   . Fracture surgery   . Abdominal hysterectomy    History   Social History  . Marital Status: Divorced    Spouse Name: N/A    Number of Children: N/A  . Years of Education: N/A   Social History Main Topics  . Smoking status: Current Some Day Smoker -- 0.5 packs/day for 20 years    Types: Cigarettes  . Smokeless tobacco: Never Used  . Alcohol Use: No   . Drug Use: No  . Sexually Active: Not Currently   Other Topics Concern  . None   Social History Narrative  . None   Family History  Problem Relation Age of Onset  . Breast cancer Sister   . Cancer Sister   . Heart disease Mother   . Heart disease Father    Allergies  Allergen Reactions  . Albuterol Other (See Comments)    PT states she had seizures from an albuterol treatment and was hospitalized for it  . Aspirin Hives    Vomits blood   Prior to Admission medications   Medication Sig Start Date End Date Taking? Authorizing Provider  amphetamine-dextroamphetamine (ADDERALL) 20 MG tablet Take 20 mg by mouth 2 (two) times daily as needed. For add   Yes Historical Provider, MD  QUEtiapine (SEROQUEL) 300 MG tablet Take 200 mg by mouth at bedtime.    Yes Historical Provider, MD  calcium carbonate (TUMS - DOSED IN MG ELEMENTAL CALCIUM) 500 MG chewable tablet Chew 1-2 tablets by mouth as needed. FOR HEARTBURN    Historical Provider, MD  methadone (DOLOPHINE) 10 MG/ML solution Take 100 mg by mouth daily.     Historical Provider, MD     ROS: The patient denies fevers, chills, night sweats, unintentional weight  loss, chest pain, palpitations, wheezing, dyspnea on exertion, nausea, vomiting, abdominal pain, dysuria, hematuria, melena, numbness, weakness, or tingling.  All other systems have been reviewed and were otherwise negative with the exception of those mentioned in the HPI and as above.    PHYSICAL EXAM: Filed Vitals:   06/30/12 1721  BP: 150/80  Pulse: 88  Temp: 98.6 F (37 C)  Resp: 18   Filed Vitals:   06/30/12 1721  Weight: 201 lb (91.173 kg)   There is no height on file to calculate BMI.  General: Alert, no acute distress HEENT:  Normocephalic, atraumatic, oropharynx patent.  Cardiovascular:  Regular rate and rhythm, no rubs murmurs or gallops.  No Carotid bruits, radial pulse intact. No pedal edema.  Respiratory: Clear to auscultation bilaterally.  No  wheezes, rales, or rhonchi.  No cyanosis, no use of accessory musculature GI: No organomegaly, abdomen is soft and non-tender, positive bowel sounds.  No masses. Skin: No rashes. Neurologic: Facial musculature symmetric. Psychiatric: Patient is appropriate throughout our interaction. Lymphatic: No cervical lymphadenopathy Musculoskeletal: Gait antalgic Lumbar spine-nl Left hip-tender at hip, no cut Decrease ROM in internal rotation, decrease ROM external rotation. Decrease flexion/extension.  4/5 strength, unable to do 90 degree leg lift, sensation intact    LABS:   EKG/XRAY:   Primary read interpreted by Dr. Conley Rolls at [facility]. No obvious fractures/dislocation.     ASSESSMENT/PLAN: Encounter Diagnoses  Name Primary?  . Left hip pain Yes  . Sprain and strain    Acute left hip pain s/p fall. NO obvious fx/dislocation. Possible occult fx but I will defer that to her ortho surgeon who she has an appt with in 9 days Will await for official radiology report F/u with Dr. Charlann Boxer 07/09/12 Rx Percocet 5/325 mg #20. NO refills. ( Addendum: This was done in error but patient already left. Asked patient to return to exchange her percocet rx for oxycodone only since she has hep C. ) She already has flexeril and also has ibuprofen 400-600 mg  Nonweightbearing, crutches.  Rx Narcotic profile pulled and there is nothing abnormal Denies current addiction issues, however she is taking methadone .    Mia Soliz PHUONG, DO 07/01/2012 10:37 AM

## 2012-07-01 ENCOUNTER — Telehealth: Payer: Self-pay | Admitting: Physician Assistant

## 2012-07-01 ENCOUNTER — Telehealth: Payer: Self-pay | Admitting: Family Medicine

## 2012-07-01 DIAGNOSIS — M25552 Pain in left hip: Secondary | ICD-10-CM

## 2012-07-01 MED ORDER — OXYCODONE HCL 5 MG PO TABS: 5.0000 mg | ORAL_TABLET | Freq: Three times a day (TID) | ORAL | Status: DC | PRN

## 2012-07-01 NOTE — Telephone Encounter (Signed)
Spoke with Dr. Conley Rolls.  See previous telephone note.  I have printed and signed Rx for oxycodone IR #20 no RF.  Pt is to bring signed Percocet rx back in exchange for oxycodone rx.

## 2012-07-01 NOTE — Telephone Encounter (Addendum)
Unable to get a hold of [name] , LM for her on cell phone and also  spoke with her daughter Gillis Ends who states she will relay message to mom. She is to return and get another rx for just plain oxycodone since the percocet I rx has tylenol in it and she cannot take tylenol due to Hep C. Sybil her daughter will call her and  Let her know that she needs to come back for her new rx and that she should turn in her old rx.

## 2012-07-01 NOTE — Telephone Encounter (Signed)
Patient did bring in the Old Rx and it was replaced with new one. FYI

## 2012-08-11 ENCOUNTER — Encounter (HOSPITAL_COMMUNITY): Payer: Self-pay | Admitting: Pharmacy Technician

## 2012-08-18 ENCOUNTER — Encounter (HOSPITAL_COMMUNITY): Payer: Self-pay

## 2012-08-18 ENCOUNTER — Encounter (HOSPITAL_COMMUNITY)
Admission: RE | Admit: 2012-08-18 | Discharge: 2012-08-18 | Disposition: A | Discharge status: Home / Self Care | Payer: Medicare Other | Source: Ambulatory Visit | Attending: Orthopedic Surgery | Admitting: Orthopedic Surgery

## 2012-08-18 HISTORY — DX: Personal history of other diseases of the digestive system: Z87.19

## 2012-08-18 HISTORY — DX: Adverse effect of unspecified anesthetic, initial encounter: T41.45XA

## 2012-08-18 HISTORY — DX: Unspecified convulsions: R56.9

## 2012-08-18 HISTORY — DX: Other complications of anesthesia, initial encounter: T88.59XA

## 2012-08-18 LAB — CBC
HCT: 38.9 % (ref 36.0–46.0)
Hemoglobin: 13.1 g/dL (ref 12.0–15.0)
MCH: 33.9 pg (ref 26.0–34.0)
MCHC: 33.7 g/dL (ref 30.0–36.0)
RDW: 13.1 % (ref 11.5–15.5)

## 2012-08-18 LAB — URINALYSIS, ROUTINE W REFLEX MICROSCOPIC
Glucose, UA: NEGATIVE mg/dL
Ketones, ur: NEGATIVE mg/dL
Leukocytes, UA: NEGATIVE
Nitrite: NEGATIVE
Specific Gravity, Urine: 1.025 (ref 1.005–1.030)
pH: 6 (ref 5.0–8.0)

## 2012-08-18 LAB — APTT: aPTT: 33 seconds (ref 24–37)

## 2012-08-18 LAB — COMPREHENSIVE METABOLIC PANEL
ALT: 25 U/L (ref 0–35)
Albumin: 3.4 g/dL — ABNORMAL LOW (ref 3.5–5.2)
Alkaline Phosphatase: 81 U/L (ref 39–117)
BUN: 11 mg/dL (ref 6–23)
Chloride: 100 mEq/L (ref 96–112)
GFR calc Af Amer: >90 mL/min (ref >90)
Glucose, Bld: 84 mg/dL (ref 70–99)
Potassium: 4.3 mEq/L (ref 3.5–5.1)
Sodium: 138 mEq/L (ref 135–145)
Total Bilirubin: 0.4 mg/dL (ref 0.3–1.2)

## 2012-08-18 LAB — PROTIME-INR: INR: 1.03 (ref 0.00–1.49)

## 2012-08-18 NOTE — Pre-Procedure Instructions (Addendum)
CXR REPORT IN EPIC FROM 01/11/12. PT STATES SHE JUST HAD EKG YESTERDAY 08/17/12 AT DR. Lerry Liner' OFFICE--OFFICE IS CLOSED TODAY--WILL REQUEST REPORT TOMORROW. MEDICAL CLEARANCE FROM DR. WILLIAMS ON PT'S CHART- FAXED BY DR. Nilsa Nutting OFFICE. PT'S PLATELET COUNT [phone] - BMET REPORT FAXED TO DR. Nilsa Nutting OFFICE.

## 2012-08-18 NOTE — Patient Instructions (Signed)
YOUR SURGERY IS SCHEDULED AT [facility]  ON:  Tuesday  4/1  REPORT TO [place] SHORT STAY [facility]ENTER AT:  1:10 PM      PHONE # FOR SHORT STAY IS [phone]  DO NOT EAT ANYTHING AFTER MIDNIGHT THE NIGHT BEFORE YOUR SURGERY.  NO FOOD, NO CHEWING GUM, NO MINTS, NO CANDIES, NO CHEWING TOBACCO. YOU MAY HAVE CLEAR LIQUIDS TO DRINK FROM MIDNIGHT UNTIL 10:00 AM DAY OF SURGERY - LIKE WATER, DR. PEPPER.        NOTHING TO DRINK AFTER 10:00 AM DAY OF SURGERY.  PLEASE TAKE THE FOLLOWING MEDICATIONS THE AM OF YOUR SURGERY WITH A FEW SIPS OF WATER:  METHADONE   DO NOT BRING VALUABLES, MONEY, CREDIT CARDS.  DO NOT WEAR JEWELRY, MAKE-UP, NAIL POLISH AND NO METAL PINS OR CLIPS IN YOUR HAIR. CONTACT LENS, DENTURES / PARTIALS, GLASSES SHOULD NOT BE WORN TO SURGERY AND IN MOST CASES-HEARING AIDS WILL NEED TO BE REMOVED.  BRING YOUR GLASSES CASE, ANY EQUIPMENT NEEDED FOR YOUR CONTACT LENS. FOR PATIENTS ADMITTED TO THE HOSPITAL--CHECK OUT TIME THE DAY OF DISCHARGE IS 11:00 AM.  ALL INPATIENT ROOMS ARE PRIVATE - WITH BATHROOM, TELEPHONE, TELEVISION AND WIFI INTERNET.                                 PLEASE READ OVER ANY  FACT SHEETS THAT YOU WERE GIVEN: MRSA INFORMATION, BLOOD TRANSFUSION INFORMATION, INCENTIVE SPIROMETER INFORMATION. FAILURE TO FOLLOW THESE INSTRUCTIONS MAY RESULT IN THE CANCELLATION OF YOUR SURGERY.   PATIENT SIGNATURE_________________________________

## 2012-08-19 NOTE — Pre-Procedure Instructions (Signed)
PT'S EKG REPORT 08/16/12 FROM DR. WILLIAMS RECEIVED AND ON PT'S CHART.

## 2012-08-20 NOTE — Progress Notes (Signed)
Spoke with matt babish pa pt is to get platlets upon arrival 08-24-2012 and is getting picc line placed Monday 3-07-24-2012

## 2012-08-20 NOTE — Pre-Procedure Instructions (Signed)
Order for preparing platelets - which are to be given day of surgery -order released and Debbie in the blood bank notified.

## 2012-08-22 NOTE — H&P (Signed)
TOTAL HIP ADMISSION H&P  Patient is admitted for left total hip arthroplasty, anterior approach.  Subjective:  Chief Complaint:   Left hip AVN / pain  HPI: [name], 52 y.o. female, has a history of pain and functional disability in the left hip(s) due to arthritis and patient has failed non-surgical conservative treatments for greater than 12 weeks to include corticosteriod injections, use of assistive devices and activity modification.  Onset of symptoms was gradual starting 3 years ago with rapidlly worsening course since that time.The patient noted no past surgery on the left hip(s).  Patient currently rates pain in the left hip at 10 out of 10 with activity. Patient has night pain, worsening of pain with activity and weight bearing, trendelenberg gait, pain that interfers with activities of daily living and pain with passive range of motion. Patient has evidence of periarticular osteophytes and joint space narrowing by imaging studies. This condition presents safety issues increasing the risk of falls. There is no current active infection.  Risks, benefits and expectations were discussed with the patient. Patient understand the risks, benefits and expectations and wishes to proceed with surgery.   D/C Plans:   Home with HHPT  Post-op Meds:   Rx given for Zanaflex, Iron, Colace and MiraLax  Tranexamic Acid:   To be given  Decadron:    To be given  FYI:   Will need Xarelto after surgery to go home.  Is used to a lot of pain medications including currently on Methadone.  Will receive a PICC line on 08/23/12, do to lack of vascular access.  Will receive platelets prior to surgery on 08/24/12   Patient Active Problem List   Diagnosis Date Noted  . Pancytopenia 09/08/2011  . Hyponatremia 09/04/2011  . Hypokalemia 09/04/2011  . Pneumonia 09/04/2011  . Anemia 09/04/2011  . Fatigue 09/03/2011  . Peptic ulcer disease   . Thrombocytopenia   . Leukocytopenia   . Hepatitis C infection     Past Medical History  Diagnosis Date  . Chronic pain syndrome   . Cervical radiculopathy   . Osteoarthritis   . GERD (gastroesophageal reflux disease)   . Thrombocytopenia   . Leukocytopenia   . Peptic ulcer disease   . DJD (degenerative joint disease)   . Polysubstance (excluding opioids) dependence   . Anxiety   . Blood transfusion without reported diagnosis   . Osteoporosis   . Bipolar affective disorder     ADHD AND SCHZIOPHRENIC--PT GOES TO [facility] - DR. BLANKMAN  . Shortness of breath     WITH EXERTION; SMOKER  . Pneumonia 2013  . H/O hiatal hernia   . Seizures     SEIZURE AFTER BREATHING TX (ALBUTEROL) IN ER; ALSO PAST HX SEIZURES DURING DRUG DETOX  . Hepatitis C infection     STATES COMPLETED TREATMENT AND NO LONGER HAS INFECTION  . Complication of anesthesia     STATES SHE WOKE UP TWICE DURING SURGERIES    Past Surgical History  Procedure Laterality Date  . Tonsilectomy, adenoidectomy, bilateral myringotomy and tubes    . Right knee arthroscopic surgery    . Total hip replacement    . Appendectomy    . Hyperectomy      due to obstructive ovarian cytst  . Spine surgery      CERVIAL 6-7 SURGERY - FUSION  . Abdominal hysterectomy    . Joint replacement  2012    RIGHT TOTAL HIP REPLACEMENT  . Cholecystectomy  No prescriptions prior to admission   Allergies  Allergen Reactions  . Albuterol Other (See Comments)    PT states she had seizures from an albuterol treatment and was hospitalized for it  . Aspirin Hives    Vomits blood  . Adhesive (Tape) Rash    History  Substance Use Topics  . Smoking status: Current Some Day Smoker -- 0.50 packs/day for 20 years    Types: Cigarettes  . Smokeless tobacco: Never Used  . Alcohol Use: No     Comment: HX OF ABUSE OF PAIN MEDS--PT IS CURRENTLY IN TREATMENT AT [facility] IN [place]    Family History  Problem Relation Age of Onset  . Breast cancer Sister   . Cancer Sister    . Heart disease Mother   . Heart disease Father      Review of Systems  Constitutional: Positive for malaise/fatigue.  HENT: Negative.   Eyes: Negative.   Respiratory: Positive for shortness of breath.   Cardiovascular: Negative.   Gastrointestinal: Negative.   Genitourinary: Negative.   Musculoskeletal: Positive for myalgias and joint pain.  Skin: Negative.   Neurological: Negative.   Endo/Heme/Allergies: Negative.   Psychiatric/Behavioral: Negative.     Objective:  Physical Exam  Constitutional: She is oriented to person, place, and time. She appears well-developed and well-nourished.  HENT:  Head: Normocephalic and atraumatic.  Mouth/Throat: Oropharynx is clear and moist.  Eyes: Pupils are equal, round, and reactive to light.  Neck: Neck supple. No JVD present. No tracheal deviation present. No thyromegaly present.  Cardiovascular: Normal rate, regular rhythm, normal heart sounds and intact distal pulses.   Respiratory: Effort normal and breath sounds normal. No stridor. No respiratory distress. She has no wheezes.  GI: Soft. There is no tenderness. There is no guarding.  Musculoskeletal:       Left hip: She exhibits decreased range of motion, decreased strength, tenderness and bony tenderness. She exhibits no swelling, no deformity and no laceration.  Lymphadenopathy:    She has no cervical adenopathy.  Neurological: She is alert and oriented to person, place, and time.  Skin: Skin is warm and dry.  Psychiatric: She has a normal mood and affect.     Labs:  Estimated body mass index is 31.55 kg/(m^2) as calculated from the following:   Height as of 05/24/12: 5' 6.93" (1.7 m).   Weight as of 06/30/12: 91.173 kg (201 lb).   Imaging Review Plain radiographs demonstrate severe degenerative joint disease of the left hip(s). The bone quality appears to be good for age and reported activity level.  Assessment/Plan:  End stage arthritis, left hip(s)  The patient  history, physical examination, clinical judgement of the provider and imaging studies are consistent with end stage degenerative joint disease of the left hip(s) and total hip arthroplasty is deemed medically necessary. The treatment options including medical management, injection therapy, arthroscopy and arthroplasty were discussed at length. The risks and benefits of total hip arthroplasty were presented and reviewed. The risks due to aseptic loosening, infection, stiffness, dislocation/subluxation,  thromboembolic complications and other imponderables were discussed.  The patient acknowledged the explanation, agreed to proceed with the plan and consent was signed. Patient is being admitted for inpatient treatment for surgery, pain control, PT, OT, prophylactic antibiotics, VTE prophylaxis, progressive ambulation and ADL's and discharge planning.The patient is planning to be discharged home with home health services.    Anastasio Auerbach Elwanda Moger   PAC  08/22/2012, 8:00 PM

## 2012-08-23 ENCOUNTER — Ambulatory Visit (HOSPITAL_COMMUNITY): Admission: RE | Admit: 2012-08-23 | Payer: Medicare Other | Source: Ambulatory Visit

## 2012-08-24 ENCOUNTER — Ambulatory Visit (HOSPITAL_COMMUNITY)
Admission: RE | Admit: 2012-08-24 | Discharge: 2012-08-24 | Disposition: A | Discharge status: Home / Self Care | Payer: Medicare Other | Source: Ambulatory Visit | Attending: Orthopedic Surgery | Admitting: Orthopedic Surgery

## 2012-08-24 ENCOUNTER — Other Ambulatory Visit (HOSPITAL_COMMUNITY): Payer: Self-pay | Admitting: Orthopedic Surgery

## 2012-08-24 ENCOUNTER — Inpatient Hospital Stay (HOSPITAL_COMMUNITY): Payer: Medicare Other

## 2012-08-24 ENCOUNTER — Inpatient Hospital Stay (HOSPITAL_COMMUNITY)
Admission: RE | Admit: 2012-08-24 | Discharge: 2012-08-26 | DRG: 470 | Disposition: A | Discharge status: Home Health | Payer: Medicare Other | Source: Ambulatory Visit | Attending: Orthopedic Surgery | Admitting: Orthopedic Surgery

## 2012-08-24 ENCOUNTER — Encounter (HOSPITAL_COMMUNITY): Payer: Self-pay | Admitting: *Deleted

## 2012-08-24 ENCOUNTER — Encounter (HOSPITAL_COMMUNITY)
Admission: RE | Disposition: A | Discharge status: Home Health | Payer: Self-pay | Source: Ambulatory Visit | Attending: Orthopedic Surgery

## 2012-08-24 ENCOUNTER — Encounter (HOSPITAL_COMMUNITY): Payer: Self-pay | Admitting: Anesthesiology

## 2012-08-24 ENCOUNTER — Inpatient Hospital Stay (HOSPITAL_COMMUNITY): Payer: Medicare Other | Admitting: Anesthesiology

## 2012-08-24 DIAGNOSIS — D500 Iron deficiency anemia secondary to blood loss (chronic): Secondary | ICD-10-CM | POA: Diagnosis not present

## 2012-08-24 DIAGNOSIS — E669 Obesity, unspecified: Secondary | ICD-10-CM | POA: Diagnosis present

## 2012-08-24 DIAGNOSIS — M810 Age-related osteoporosis without current pathological fracture: Secondary | ICD-10-CM | POA: Diagnosis present

## 2012-08-24 DIAGNOSIS — K219 Gastro-esophageal reflux disease without esophagitis: Secondary | ICD-10-CM | POA: Diagnosis present

## 2012-08-24 DIAGNOSIS — M87059 Idiopathic aseptic necrosis of unspecified femur: Principal | ICD-10-CM | POA: Diagnosis present

## 2012-08-24 DIAGNOSIS — F411 Generalized anxiety disorder: Secondary | ICD-10-CM | POA: Diagnosis present

## 2012-08-24 DIAGNOSIS — R52 Pain, unspecified: Secondary | ICD-10-CM

## 2012-08-24 DIAGNOSIS — Z96649 Presence of unspecified artificial hip joint: Secondary | ICD-10-CM

## 2012-08-24 DIAGNOSIS — D62 Acute posthemorrhagic anemia: Secondary | ICD-10-CM | POA: Diagnosis not present

## 2012-08-24 DIAGNOSIS — Z01812 Encounter for preprocedural laboratory examination: Secondary | ICD-10-CM

## 2012-08-24 HISTORY — PX: TOTAL HIP ARTHROPLASTY: SHX124

## 2012-08-24 LAB — TYPE AND SCREEN

## 2012-08-24 SURGERY — ARTHROPLASTY, HIP, TOTAL, ANTERIOR APPROACH
Anesthesia: General | Site: Hip | Laterality: Left | Wound class: Clean

## 2012-08-24 MED ORDER — ROCURONIUM BROMIDE 100 MG/10ML IV SOLN
INTRAVENOUS | Status: DC | PRN
  Administered 2012-08-24: 35 mg via INTRAVENOUS

## 2012-08-24 MED ORDER — DIAZEPAM 5 MG/ML IJ SOLN
5.0000 mg | Freq: Once | INTRAMUSCULAR | Status: AC | PRN
  Administered 2012-08-24: 5 mg via INTRAVENOUS

## 2012-08-24 MED ORDER — DOCUSATE SODIUM 100 MG PO CAPS
100.0000 mg | ORAL_CAPSULE | Freq: Two times a day (BID) | ORAL | Status: DC
  Administered 2012-08-25 – 2012-08-26 (×3): 100 mg via ORAL

## 2012-08-24 MED ORDER — SUCCINYLCHOLINE CHLORIDE 20 MG/ML IJ SOLN
INTRAMUSCULAR | Status: DC | PRN
  Administered 2012-08-24: 100 mg via INTRAVENOUS

## 2012-08-24 MED ORDER — DIAZEPAM 5 MG/ML IJ SOLN
INTRAMUSCULAR | Status: AC
  Filled 2012-08-24: qty 2

## 2012-08-24 MED ORDER — OXYCODONE HCL 5 MG PO TABS
5.0000 mg | ORAL_TABLET | ORAL | Status: DC
  Administered 2012-08-24: 10 mg via ORAL
  Administered 2012-08-25 (×3): 15 mg via ORAL
  Administered 2012-08-25: 10 mg via ORAL
  Administered 2012-08-25 – 2012-08-26 (×3): 15 mg via ORAL
  Filled 2012-08-24: qty 3
  Filled 2012-08-24: qty 2
  Filled 2012-08-24 (×3): qty 3
  Filled 2012-08-24: qty 2
  Filled 2012-08-24 (×2): qty 3

## 2012-08-24 MED ORDER — 0.9 % SODIUM CHLORIDE (POUR BTL) OPTIME
TOPICAL | Status: DC | PRN
  Administered 2012-08-24: 1000 mL

## 2012-08-24 MED ORDER — ONDANSETRON HCL 4 MG/2ML IJ SOLN: 4.0000 mg | Freq: Four times a day (QID) | INTRAMUSCULAR | Status: DC | PRN

## 2012-08-24 MED ORDER — MEPERIDINE HCL 50 MG/ML IJ SOLN: 6.2500 mg | INTRAMUSCULAR | Status: DC | PRN

## 2012-08-24 MED ORDER — HYDROMORPHONE HCL PF 1 MG/ML IJ SOLN
INTRAMUSCULAR | Status: AC
  Filled 2012-08-24: qty 1

## 2012-08-24 MED ORDER — CEFAZOLIN SODIUM-DEXTROSE 2-3 GM-% IV SOLR
2.0000 g | INTRAVENOUS | Status: AC
  Administered 2012-08-24: 2 g via INTRAVENOUS

## 2012-08-24 MED ORDER — HYDROMORPHONE HCL PF 1 MG/ML IJ SOLN
INTRAMUSCULAR | Status: AC
  Filled 2012-08-24: qty 2

## 2012-08-24 MED ORDER — QUETIAPINE FUMARATE 100 MG PO TABS
100.0000 mg | ORAL_TABLET | Freq: Every day | ORAL | Status: DC
  Administered 2012-08-24 – 2012-08-25 (×2): 100 mg via ORAL
  Filled 2012-08-24 (×3): qty 1

## 2012-08-24 MED ORDER — LIDOCAINE HCL (CARDIAC) 20 MG/ML IV SOLN
INTRAVENOUS | Status: DC | PRN
  Administered 2012-08-24: 80 mg via INTRAVENOUS

## 2012-08-24 MED ORDER — FERROUS SULFATE 325 (65 FE) MG PO TABS
325.0000 mg | ORAL_TABLET | Freq: Three times a day (TID) | ORAL | Status: DC
  Administered 2012-08-25 – 2012-08-26 (×5): 325 mg via ORAL
  Filled 2012-08-24 (×7): qty 1

## 2012-08-24 MED ORDER — DEXAMETHASONE SODIUM PHOSPHATE 10 MG/ML IJ SOLN
10.0000 mg | Freq: Once | INTRAMUSCULAR | Status: AC
  Administered 2012-08-24: 10 mg via INTRAVENOUS

## 2012-08-24 MED ORDER — AMPHETAMINE-DEXTROAMPHETAMINE 20 MG PO TABS
20.0000 mg | ORAL_TABLET | Freq: Three times a day (TID) | ORAL | Status: DC
  Administered 2012-08-25 – 2012-08-26 (×4): 20 mg via ORAL
  Filled 2012-08-24 (×4): qty 1

## 2012-08-24 MED ORDER — MENTHOL 3 MG MT LOZG
1.0000 | LOZENGE | OROMUCOSAL | Status: DC | PRN
  Filled 2012-08-24: qty 9

## 2012-08-24 MED ORDER — HYDROMORPHONE HCL PF 1 MG/ML IJ SOLN
0.2500 mg | INTRAMUSCULAR | Status: DC | PRN
  Administered 2012-08-24: 0.5 mg via INTRAVENOUS
  Administered 2012-08-24: 1 mg via INTRAVENOUS
  Administered 2012-08-24 (×5): 0.5 mg via INTRAVENOUS

## 2012-08-24 MED ORDER — SODIUM CHLORIDE 0.9 % IV SOLN
1000.0000 mg | Freq: Once | INTRAVENOUS | Status: AC
  Administered 2012-08-24: 1000 mg via INTRAVENOUS
  Filled 2012-08-24: qty 10

## 2012-08-24 MED ORDER — EPHEDRINE SULFATE 50 MG/ML IJ SOLN
INTRAMUSCULAR | Status: DC | PRN
  Administered 2012-08-24 (×2): 10 mg via INTRAVENOUS

## 2012-08-24 MED ORDER — LACTATED RINGERS IV SOLN
INTRAVENOUS | Status: DC
  Administered 2012-08-24: 1000 mL via INTRAVENOUS

## 2012-08-24 MED ORDER — CHLORHEXIDINE GLUCONATE 4 % EX LIQD: 60.0000 mL | Freq: Once | CUTANEOUS | Status: DC

## 2012-08-24 MED ORDER — PROMETHAZINE HCL 25 MG/ML IJ SOLN: 6.2500 mg | INTRAMUSCULAR | Status: DC | PRN

## 2012-08-24 MED ORDER — PHENYLEPHRINE HCL 10 MG/ML IJ SOLN
10.0000 mg | INTRAVENOUS | Status: DC | PRN
  Administered 2012-08-24: 50 ug/min via INTRAVENOUS

## 2012-08-24 MED ORDER — ACETAMINOPHEN 10 MG/ML IV SOLN: 1000.0000 mg | Freq: Once | INTRAVENOUS | Status: DC | PRN

## 2012-08-24 MED ORDER — METHADONE HCL 10 MG/ML PO CONC: 20.0000 mg | Freq: Two times a day (BID) | ORAL | Status: DC

## 2012-08-24 MED ORDER — ACETAMINOPHEN 650 MG RE SUPP: 650.0000 mg | Freq: Four times a day (QID) | RECTAL | Status: DC | PRN

## 2012-08-24 MED ORDER — METHOCARBAMOL 100 MG/ML IJ SOLN
500.0000 mg | Freq: Four times a day (QID) | INTRAVENOUS | Status: DC | PRN
  Filled 2012-08-24: qty 5

## 2012-08-24 MED ORDER — FENTANYL CITRATE 0.05 MG/ML IJ SOLN
INTRAMUSCULAR | Status: DC | PRN
  Administered 2012-08-24 (×2): 50 ug via INTRAVENOUS
  Administered 2012-08-24 (×2): 100 ug via INTRAVENOUS
  Administered 2012-08-24: 50 ug via INTRAVENOUS
  Administered 2012-08-24: 100 ug via INTRAVENOUS
  Administered 2012-08-24: 50 ug via INTRAVENOUS

## 2012-08-24 MED ORDER — MIDAZOLAM HCL 2 MG/2ML IJ SOLN
2.0000 mg | Freq: Once | INTRAMUSCULAR | Status: AC
  Administered 2012-08-24: 2 mg via INTRAVENOUS

## 2012-08-24 MED ORDER — SODIUM CHLORIDE 0.9 % IV SOLN
INTRAVENOUS | Status: DC
  Administered 2012-08-24: via INTRAVENOUS
  Filled 2012-08-24 (×4): qty 1000

## 2012-08-24 MED ORDER — OXYCODONE HCL 5 MG/5ML PO SOLN
5.0000 mg | Freq: Once | ORAL | Status: DC | PRN
  Filled 2012-08-24: qty 5

## 2012-08-24 MED ORDER — LIDOCAINE HCL 1 % IJ SOLN
INTRAMUSCULAR | Status: AC
  Filled 2012-08-24: qty 20

## 2012-08-24 MED ORDER — POLYETHYLENE GLYCOL 3350 17 G PO PACK
17.0000 g | PACK | Freq: Two times a day (BID) | ORAL | Status: DC
  Administered 2012-08-25 – 2012-08-26 (×2): 17 g via ORAL

## 2012-08-24 MED ORDER — OXYCODONE HCL 5 MG PO TABS: 5.0000 mg | ORAL_TABLET | Freq: Once | ORAL | Status: DC | PRN

## 2012-08-24 MED ORDER — STERILE WATER FOR IRRIGATION IR SOLN
Status: DC | PRN
  Administered 2012-08-24: 1500 mL

## 2012-08-24 MED ORDER — CALCIUM CARBONATE ANTACID 500 MG PO CHEW
1.0000 | CHEWABLE_TABLET | ORAL | Status: DC | PRN
  Filled 2012-08-24: qty 2

## 2012-08-24 MED ORDER — KETAMINE HCL 10 MG/ML IJ SOLN
INTRAMUSCULAR | Status: DC | PRN
  Administered 2012-08-24: 50 mg via INTRAVENOUS

## 2012-08-24 MED ORDER — MAGNESIUM CITRATE PO SOLN: 1.0000 | Freq: Once | ORAL | Status: AC | PRN

## 2012-08-24 MED ORDER — MIDAZOLAM HCL 2 MG/2ML IJ SOLN
INTRAMUSCULAR | Status: AC
  Filled 2012-08-24: qty 2

## 2012-08-24 MED ORDER — PROPOFOL 10 MG/ML IV BOLUS
INTRAVENOUS | Status: DC | PRN
  Administered 2012-08-24: 200 mg via INTRAVENOUS

## 2012-08-24 MED ORDER — METOCLOPRAMIDE HCL 10 MG PO TABS: 5.0000 mg | ORAL_TABLET | Freq: Three times a day (TID) | ORAL | Status: DC | PRN

## 2012-08-24 MED ORDER — CEFAZOLIN SODIUM-DEXTROSE 2-3 GM-% IV SOLR
INTRAVENOUS | Status: AC
  Filled 2012-08-24: qty 50

## 2012-08-24 MED ORDER — GLYCOPYRROLATE 0.2 MG/ML IJ SOLN
INTRAMUSCULAR | Status: DC | PRN
  Administered 2012-08-24: 0.6 mg via INTRAVENOUS

## 2012-08-24 MED ORDER — MIDAZOLAM HCL 5 MG/5ML IJ SOLN
INTRAMUSCULAR | Status: DC | PRN
  Administered 2012-08-24 (×2): 2 mg via INTRAVENOUS

## 2012-08-24 MED ORDER — SODIUM CHLORIDE 0.9 % IV SOLN
INTRAVENOUS | Status: DC
  Administered 2012-08-24: 14:00:00 via INTRAVENOUS

## 2012-08-24 MED ORDER — HYDROMORPHONE HCL PF 1 MG/ML IJ SOLN
0.5000 mg | INTRAMUSCULAR | Status: DC | PRN
  Administered 2012-08-24 – 2012-08-25 (×2): 1 mg via INTRAVENOUS
  Filled 2012-08-24 (×2): qty 1

## 2012-08-24 MED ORDER — CEFAZOLIN SODIUM-DEXTROSE 2-3 GM-% IV SOLR
2.0000 g | Freq: Four times a day (QID) | INTRAVENOUS | Status: AC
  Administered 2012-08-24 – 2012-08-25 (×2): 2 g via INTRAVENOUS
  Filled 2012-08-24 (×2): qty 50

## 2012-08-24 MED ORDER — ONDANSETRON HCL 4 MG/2ML IJ SOLN
INTRAMUSCULAR | Status: DC | PRN
  Administered 2012-08-24: 4 mg via INTRAVENOUS

## 2012-08-24 MED ORDER — METHOCARBAMOL 500 MG PO TABS
500.0000 mg | ORAL_TABLET | Freq: Four times a day (QID) | ORAL | Status: DC | PRN
  Administered 2012-08-25 – 2012-08-26 (×4): 500 mg via ORAL
  Filled 2012-08-24 (×4): qty 1

## 2012-08-24 MED ORDER — NEOSTIGMINE METHYLSULFATE 1 MG/ML IJ SOLN
INTRAMUSCULAR | Status: DC | PRN
  Administered 2012-08-24: 5 mg via INTRAVENOUS

## 2012-08-24 MED ORDER — PHENOL 1.4 % MT LIQD: 1.0000 | OROMUCOSAL | Status: DC | PRN

## 2012-08-24 MED ORDER — METOCLOPRAMIDE HCL 5 MG/ML IJ SOLN: 5.0000 mg | Freq: Three times a day (TID) | INTRAMUSCULAR | Status: DC | PRN

## 2012-08-24 MED ORDER — ONDANSETRON HCL 4 MG PO TABS: 4.0000 mg | ORAL_TABLET | Freq: Four times a day (QID) | ORAL | Status: DC | PRN

## 2012-08-24 MED ORDER — HYDROMORPHONE HCL PF 1 MG/ML IJ SOLN
INTRAMUSCULAR | Status: DC | PRN
  Administered 2012-08-24 (×2): 1 mg via INTRAVENOUS
  Administered 2012-08-24: 1.5 mg via INTRAVENOUS
  Administered 2012-08-24 (×2): 1 mg via INTRAVENOUS

## 2012-08-24 MED ORDER — HYDROMORPHONE HCL PF 1 MG/ML IJ SOLN
2.0000 mg | Freq: Once | INTRAMUSCULAR | Status: AC | PRN
  Administered 2012-08-24: 2 mg via INTRAVENOUS

## 2012-08-24 MED ORDER — ACETAMINOPHEN 325 MG PO TABS
650.0000 mg | ORAL_TABLET | Freq: Four times a day (QID) | ORAL | Status: DC | PRN
  Administered 2012-08-26: 650 mg via ORAL
  Filled 2012-08-24: qty 2

## 2012-08-24 MED ORDER — LABETALOL HCL 5 MG/ML IV SOLN
INTRAVENOUS | Status: DC | PRN
  Administered 2012-08-24: 2.5 mg via INTRAVENOUS

## 2012-08-24 MED ORDER — ESMOLOL HCL 10 MG/ML IV SOLN
INTRAVENOUS | Status: DC | PRN
  Administered 2012-08-24: 20 mg via INTRAVENOUS

## 2012-08-24 SURGICAL SUPPLY — 39 items
BAG SPEC THK2 15X12 ZIP CLS (MISCELLANEOUS) ×2
BAG ZIPLOCK 12X15 (MISCELLANEOUS) ×4 IMPLANT
BLADE SAW SGTL 18X1.27X75 (BLADE) ×2 IMPLANT
CLOTH BEACON ORANGE TIMEOUT ST (SAFETY) ×2 IMPLANT
DERMABOND ADVANCED (GAUZE/BANDAGES/DRESSINGS) ×1
DERMABOND ADVANCED .7 DNX12 (GAUZE/BANDAGES/DRESSINGS) ×1 IMPLANT
DRAPE C-ARM 42X72 X-RAY (DRAPES) ×2 IMPLANT
DRAPE STERI IOBAN 125X83 (DRAPES) ×2 IMPLANT
DRAPE U-SHAPE 47X51 STRL (DRAPES) ×6 IMPLANT
DRSG AQUACEL AG ADV 3.5X10 (GAUZE/BANDAGES/DRESSINGS) ×2 IMPLANT
DRSG OPSITE POSTOP 3X4 (GAUZE/BANDAGES/DRESSINGS) ×2 IMPLANT
DRSG TEGADERM 4X4.75 (GAUZE/BANDAGES/DRESSINGS) ×2 IMPLANT
DURAPREP 26ML APPLICATOR (WOUND CARE) ×2 IMPLANT
ELECT BLADE TIP CTD 4 INCH (ELECTRODE) ×2 IMPLANT
ELECT REM PT RETURN 9FT ADLT (ELECTROSURGICAL) ×2
ELECTRODE REM PT RTRN 9FT ADLT (ELECTROSURGICAL) ×1 IMPLANT
EVACUATOR 1/8 PVC DRAIN (DRAIN) ×2 IMPLANT
FACESHIELD LNG OPTICON STERILE (SAFETY) ×8 IMPLANT
GAUZE SPONGE 2X2 8PLY STRL LF (GAUZE/BANDAGES/DRESSINGS) IMPLANT
GLOVE BIOGEL PI IND STRL 7.5 (GLOVE) ×1 IMPLANT
GLOVE BIOGEL PI IND STRL 8 (GLOVE) ×1 IMPLANT
GLOVE BIOGEL PI INDICATOR 7.5 (GLOVE) ×1
GLOVE BIOGEL PI INDICATOR 8 (GLOVE) ×1
GLOVE ECLIPSE 8.0 STRL XLNG CF (GLOVE) ×2 IMPLANT
GLOVE ORTHO TXT STRL SZ7.5 (GLOVE) ×4 IMPLANT
GOWN BRE IMP PREV XXLGXLNG (GOWN DISPOSABLE) ×2 IMPLANT
GOWN STRL NON-REIN LRG LVL3 (GOWN DISPOSABLE) ×6 IMPLANT
KIT BASIN OR (CUSTOM PROCEDURE TRAY) ×2 IMPLANT
PACK TOTAL JOINT (CUSTOM PROCEDURE TRAY) ×2 IMPLANT
PADDING CAST COTTON 6X4 STRL (CAST SUPPLIES) ×2 IMPLANT
SPONGE GAUZE 2X2 STER 10/PKG (GAUZE/BANDAGES/DRESSINGS)
SUCTION FRAZIER 12FR DISP (SUCTIONS) ×2 IMPLANT
SUT MNCRL AB 4-0 PS2 18 (SUTURE) ×2 IMPLANT
SUT VIC AB 1 CT1 36 (SUTURE) ×10 IMPLANT
SUT VIC AB 2-0 CT1 27 (SUTURE) ×2
SUT VIC AB 2-0 CT1 TAPERPNT 27 (SUTURE) ×2 IMPLANT
SUT VLOC 180 0 24IN GS25 (SUTURE) ×2 IMPLANT
TOWEL OR 17X26 10 PK STRL BLUE (TOWEL DISPOSABLE) ×4 IMPLANT
TRAY FOLEY CATH 14FRSI W/METER (CATHETERS) ×2 IMPLANT

## 2012-08-24 NOTE — Transfer of Care (Signed)
Immediate Anesthesia Transfer of Care Note  Patient: [name]  Procedure(s) Performed: Procedure(s) (LRB): LEFT TOTAL HIP ARTHROPLASTY ANTERIOR APPROACH (Left)  Patient Location: PACU  Anesthesia Type: General  Level of Consciousness: sedated, patient cooperative and responds to stimulaton  Airway & Oxygen Therapy: Patient Spontanous Breathing and Patient connected to face mask oxgen  Post-op Assessment: Report given to PACU RN and Post -op Vital signs reviewed and stable  Post vital signs: Reviewed and stable  Complications: No apparent anesthesia complications

## 2012-08-24 NOTE — Anesthesia Postprocedure Evaluation (Signed)
Anesthesia Post Note  Patient: [name]  Procedure(s) Performed: Procedure(s) (LRB): LEFT TOTAL HIP ARTHROPLASTY ANTERIOR APPROACH (Left)  Anesthesia type: General  Patient location: PACU  Post pain: Pain level controlled  Post assessment: Post-op Vital signs reviewed  Last Vitals: BP 139/77  Pulse 69  Temp(Src) 36.9 C (Oral)  Resp 18  SpO2 97%  Post vital signs: Reviewed  Level of consciousness: sedated  Complications: No apparent anesthesia complications

## 2012-08-24 NOTE — Anesthesia Preprocedure Evaluation (Addendum)
Anesthesia Evaluation  Patient identified by MRN, date of birth, ID band Patient awake    Reviewed: Allergy & Precautions, H&P , NPO status , Patient's Chart, lab work & pertinent test results  Airway Mallampati: II TM Distance: >3 FB Neck ROM: Full    Dental  (+) Dental Advisory Given, Edentulous Upper and Edentulous Lower   Pulmonary shortness of breath and with exertion, pneumonia -, resolved,  breath sounds clear to auscultation        Cardiovascular negative cardio ROS  Rhythm:Regular Rate:Normal     Neuro/Psych Seizures -, Well Controlled,  PSYCHIATRIC DISORDERS Anxiety  Neuromuscular disease    GI/Hepatic hiatal hernia, PUD, GERD-  Medicated,(+)     substance abuse   , Hepatitis -, C  Endo/Other  negative endocrine ROS  Renal/GU negative Renal ROS     Musculoskeletal negative musculoskeletal ROS (+)   Abdominal (+) + obese,   Peds  Hematology negative hematology ROS (+)   Anesthesia Other Findings   Reproductive/Obstetrics negative OB ROS                         Anesthesia Physical Anesthesia Plan  ASA: III  Anesthesia Plan: General   Post-op Pain Management:    Induction: Intravenous  Airway Management Planned: Oral ETT  Additional Equipment:   Intra-op Plan:   Post-operative Plan: Extubation in OR  Informed Consent: I have reviewed the patients History and Physical, chart, labs and discussed the procedure including the risks, benefits and alternatives for the proposed anesthesia with the patient or authorized representative who has indicated his/her understanding and acceptance.   Dental advisory given  Plan Discussed with: CRNA  Anesthesia Plan Comments:        Anesthesia Quick Evaluation

## 2012-08-24 NOTE — Interval H&P Note (Signed)
History and Physical Interval Note:  08/24/2012 12:40 PM  [name]  has presented today for surgery, with the diagnosis of LEFT HIP AVASCULAR NORCROSIS  The various methods of treatment have been discussed with the patient and family. After consideration of risks, benefits and other options for treatment, the patient has consented to  Procedure(s): LEFT TOTAL HIP ARTHROPLASTY ANTERIOR APPROACH (Left) as a surgical intervention .  The patient's history has been reviewed, patient examined, no change in status, stable for surgery.  I have reviewed the patient's chart and labs.  Questions were answered to the patient's satisfaction.     Shelda Pal

## 2012-08-24 NOTE — Progress Notes (Signed)
Received to short stay from radiology post PICC line insertion Rt upper arm. Site looks good. Pt alert and comfortable. For Left hip replacement this afternoon

## 2012-08-24 NOTE — Op Note (Signed)
NAME:  [name]                ACCOUNT NO.: [id number]      MEDICAL RECORD NO.: [id number]      FACILITY:  [facility]      PHYSICIAN:  Durene Romans D  DATE OF BIRTH:  09/19/1960     DATE OF PROCEDURE:  08/24/2012                                 OPERATIVE REPORT         PREOPERATIVE DIAGNOSIS: Left  hip avascular necrosis.      POSTOPERATIVE DIAGNOSIS:  Left hip avascular necrosis. History of right total hip replacement     PROCEDURE:  Left total hip replacement through an anterior approach   utilizing DePuy THR system, component size 52mm pinnacle cup, a size 36+4 neutral   Altrex liner, a size 8 Hi Tri Lock stem with a 36+5 delta ceramic   ball.      SURGEON:  Madlyn Frankel. Charlann Boxer, M.D.      ASSISTANT:  Lanney Gins, PA      ANESTHESIA:  General.      SPECIMENS:  None.      COMPLICATIONS:  None.      BLOOD LOSS:  600 cc     DRAINS:  One Hemovac.      INDICATION OF THE PROCEDURE:  OPHA MCGHEE is a 52 y.o. female who had   presented to office for evaluation of left hip pain.  Radiographs revealed   progressive degenerative changes with bone-on-bone   articulation to the  hip joint.  The patient had painful limited range of   motion significantly affecting their overall quality of life.  The patient was failing to    respond to conservative measures, and at this point was ready   to proceed with more definitive measures.  The patient has noted progressive   degenerative changes in his hip, progressive problems and dysfunction   with regarding the hip prior to surgery.  Consent was obtained for   benefit of pain relief.  Specific risk of infection, DVT, component   failure, dislocation, need for revision surgery, as well discussion of   the anterior versus posterior approach were reviewed.  Consent was   obtained for benefit of anterior pain relief through an anterior   approach.      PROCEDURE IN DETAIL:  The patient was brought to operative  theater.   Once adequate anesthesia, preoperative antibiotics, 2gm Ancef administered.   The patient was positioned supine on the OSI Hanna table.  Once adequate   padding of boney process was carried out, we had predraped out the hip, and  used fluoroscopy to confirm orientation of the pelvis and position.      The left hip was then prepped and draped from proximal iliac crest to   mid thigh with shower curtain technique.      Time-out was performed identifying the patient, planned procedure, and   extremity.     An incision was then made 2 cm distal and lateral to the   anterior superior iliac spine extending over the orientation of the   tensor fascia lata muscle and sharp dissection was carried down to the   fascia of the muscle and protractor placed in the soft tissues.      The fascia was then incised.  The muscle belly was identified and swept   laterally and retractor placed along the superior neck.  Following   cauterization of the circumflex vessels and removing some pericapsular   fat, a second cobra retractor was placed on the inferior neck.  A third   retractor was placed on the anterior acetabulum after elevating the   anterior rectus.  A L-capsulotomy was along the line of the   superior neck to the trochanteric fossa, then extended proximally and   distally.  Tag sutures were placed and the retractors were then placed   intracapsular.  We then identified the trochanteric fossa and   orientation of my neck cut, confirmed this radiographically   and then made a neck osteotomy with the femur on traction.  The femoral   head was removed without difficulty or complication.  Traction was let   off and retractors were placed posterior and anterior around the   acetabulum.      The labrum and foveal tissue were debrided.  I began reaming with a 47mm   reamer and reamed up to 51mm reamer with good bony bed preparation and a 52   cup was chosen.  The final 52mm Pinnacle cup  was then impacted under fluoroscopy  to confirm the depth of penetration and orientation with respect to   abduction.  A screw was placed followed by the hole eliminator.  The final   36+4 neutral Altrex liner was impacted with good visualized rim fit.  The cup was positioned anatomically within the acetabular portion of the pelvis.      At this point, the femur was rolled at 80 degrees.  Further capsule was   released off the inferior aspect of the femoral neck.  I then   released the superior capsule proximally.  The hook was placed laterally   along the femur and elevated manually and held in position with the bed   hook.  The leg was then extended and adducted with the leg rolled to 100   degrees of external rotation.  Once the proximal femur was fully   exposed, I used a box osteotome to set orientation.  I then began   broaching with the starting chili pepper broach and passed this by hand and then broached up to 8 which matched the contralateral hip.  With the 8 broach in place I chose a high offset neck and did a trial reduction with the 36+5 ball to match the other hip.  The offset was appropriate, leg lengths   appeared to be equal, confirmed radiographically.   Given these findings, I went ahead and dislocated the hip, repositioned all   retractors and positioned the right hip in the extended and abducted position.  The final 8Hi Tri Lock stem was   chosen and it was impacted down to the level of neck cut.  Based on this   and the trial reduction, a 36+5 delta ceramic ball was chosen and   impacted onto a clean and dry trunnion, and the hip was reduced.  The   hip had been irrigated throughout the case again at this point.  I did   reapproximate the superior capsular leaflet to the anterior leaflet   using #1 Vicryl, placed a medium Hemovac drain deep.  The fascia of the   tensor fascia lata muscle was then reapproximated using #1 Vicryl.  The   remaining wound was closed with 2-0  Vicryl and running 4-0 Monocryl.   The  hip was cleaned, dried, and dressed sterilely using Dermabond and   Aquacel dressing.  Drain site dressed separately.  She was then brought   to recovery room in stable condition tolerating the procedure well.    Danae Orleans, PA-C was present for the entirety of the case involved from   preoperative positioning, perioperative retractor management, general   facilitation of the case, as well as primary wound closure as assistant.            Pietro Cassis Alvan Dame, M.D.            MDO/MEDQ  D:  03/18/2011  T:  03/18/2011  Job:  [id number]      Electronically Signed by Paralee Cancel M.D. on 03/24/2011 09:15:38 AM

## 2012-08-24 NOTE — Procedures (Signed)
Placement of right arm PICC.  Tip in lower SVC.  Ready to use.

## 2012-08-25 ENCOUNTER — Encounter (HOSPITAL_COMMUNITY): Payer: Self-pay

## 2012-08-25 DIAGNOSIS — Z96649 Presence of unspecified artificial hip joint: Secondary | ICD-10-CM

## 2012-08-25 DIAGNOSIS — D500 Iron deficiency anemia secondary to blood loss (chronic): Secondary | ICD-10-CM | POA: Diagnosis not present

## 2012-08-25 DIAGNOSIS — E669 Obesity, unspecified: Secondary | ICD-10-CM | POA: Diagnosis present

## 2012-08-25 LAB — PREPARE PLATELET PHERESIS: Unit division: 0

## 2012-08-25 LAB — BASIC METABOLIC PANEL
BUN: 11 mg/dL (ref 6–23)
Calcium: 8.6 mg/dL (ref 8.4–10.5)
Creatinine, Ser: 0.8 mg/dL (ref 0.50–1.10)
GFR calc non Af Amer: 84 mL/min — ABNORMAL LOW (ref >90)
Glucose, Bld: 168 mg/dL — ABNORMAL HIGH (ref 70–99)
Sodium: 137 mEq/L (ref 135–145)

## 2012-08-25 LAB — CBC
HCT: 30.4 % — ABNORMAL LOW (ref 36.0–46.0)
Hemoglobin: 10.6 g/dL — ABNORMAL LOW (ref 12.0–15.0)
MCH: 34.6 pg — ABNORMAL HIGH (ref 26.0–34.0)
MCHC: 34.9 g/dL (ref 30.0–36.0)

## 2012-08-25 MED ORDER — RIVAROXABAN 10 MG PO TABS
10.0000 mg | ORAL_TABLET | Freq: Every day | ORAL | Status: DC
  Administered 2012-08-25: 10 mg via ORAL
  Filled 2012-08-25: qty 1

## 2012-08-25 MED ORDER — METHADONE HCL 10 MG/ML PO CONC: 95.0000 mg | Freq: Every day | ORAL | Status: DC

## 2012-08-25 MED ORDER — METHADONE HCL 10 MG PO TABS
95.0000 mg | ORAL_TABLET | Freq: Every day | ORAL | Status: DC
  Administered 2012-08-25 – 2012-08-26 (×2): 95 mg via ORAL
  Filled 2012-08-25: qty 10
  Filled 2012-08-25: qty 1
  Filled 2012-08-25: qty 10

## 2012-08-25 MED ORDER — ALUM & MAG HYDROXIDE-SIMETH 200-200-20 MG/5ML PO SUSP
30.0000 mL | Freq: Four times a day (QID) | ORAL | Status: DC | PRN
  Administered 2012-08-25 – 2012-08-26 (×2): 30 mL via ORAL
  Filled 2012-08-25 (×2): qty 30

## 2012-08-25 NOTE — Progress Notes (Signed)
Physical Therapy Treatment Patient Details Name: [name] MRN: [id number] DOB: February 28, 1961 Today's Date: 08/25/2012 Time: 4098-1191 PT Time Calculation (min): 14 min  PT Assessment / Plan / Recommendation Comments on Treatment Session  Pt able to ambulate this afternoon, however continues to state that her pain meds are being blocked by Methodone.      Follow Up Recommendations  Home health PT;SNF;Supervision/Assistance - 24 hour     Does the patient have the potential to tolerate intense rehabilitation     Barriers to Discharge        Equipment Recommendations  None recommended by PT    Recommendations for Other Services    Frequency 7X/week   Plan Discharge plan remains appropriate    Precautions / Restrictions Precautions Precautions: Fall Restrictions Other Position/Activity Restrictions: WBAT   Pertinent Vitals/Pain Continues to have max c/o pain, seems to be less when ambulating this time.     Mobility  Bed Mobility Bed Mobility: Sit to Supine Sit to Supine: 4: Min assist;3: Mod assist Details for Bed Mobility Assistance: Assist for BLE into bed with max cues for technique.  Transfers Transfers: Sit to Stand;Stand to Sit Sit to Stand: 4: Min assist;With upper extremity assist;With armrests;From chair/3-in-1 Stand to Sit: 4: Min assist;With upper extremity assist;With armrests;To chair/3-in-1;To bed Details for Transfer Assistance: Performed x 2 in order to use 3in1 in restroom with max cues for hand placement and safety.  Pt stood impulsively without RW being in front of her.  Ambulation/Gait Ambulation/Gait Assistance: 1: +2 Total assist Ambulation/Gait: Patient Percentage: 60% Ambulation Distance (Feet): 100 Feet Assistive device: Rolling walker Ambulation/Gait Assistance Details: cues for sequencing/technique with RW and to maintain upright posture.   Gait Pattern: Step-to pattern;Decreased stride length;Antalgic;Trunk flexed Gait velocity: decreased     Exercises     PT Diagnosis:    PT Problem List:   PT Treatment Interventions:     PT Goals Acute Rehab PT Goals PT Goal Formulation: With patient Time For Goal Achievement: 08/28/12 Potential to Achieve Goals: Good Pt will go Sit to Supine/Side: with supervision PT Goal: Sit to Supine/Side - Progress: Progressing toward goal Pt will go Sit to Stand: with supervision PT Goal: Sit to Stand - Progress: Progressing toward goal Pt will go Stand to Sit: with supervision PT Goal: Stand to Sit - Progress: Progressing toward goal Pt will Ambulate: 51 - 150 feet;with supervision;with least restrictive assistive device PT Goal: Ambulate - Progress: Progressing toward goal  Visit Information  Last PT Received On: 08/25/12 Assistance Needed: +1    Subjective Data  Subjective: The methodone is blocking my pain meds.  Patient Stated Goal: to go home   Cognition  Cognition Overall Cognitive Status: Impaired Area of Impairment: Safety/judgement Arousal/Alertness: Awake/alert Orientation Level: Appears intact for tasks assessed Behavior During Session: [facility] for tasks performed Safety/Judgement: Decreased awareness of safety precautions;Decreased safety judgement for tasks assessed;Impulsive;Decreased awareness of need for assistance Awareness of Errors: Assistance required to identify errors made;Assistance required to correct errors made    Balance     End of Session PT - End of Session Equipment Utilized During Treatment: Gait belt Activity Tolerance: Patient limited by pain Patient left: in bed;with call bell/phone within reach Nurse Communication: Mobility status   GP     Vista Deck 08/25/2012, 4:59 PM

## 2012-08-25 NOTE — Progress Notes (Signed)
   Subjective: 1 Day Post-Op Procedure(s) (LRB): LEFT TOTAL HIP ARTHROPLASTY ANTERIOR APPROACH (Left)   Patient reports pain as moderate, she knows that she has a higher tolerance because of the methadone. No events throughout the night.   Objective:   VITALS:   Filed Vitals:   08/25/12 0945  BP: 176/94  Pulse: 78  Temp: 98.3 F (36.8 C)  Resp: 18    Neurovascular intact Dorsiflexion/Plantar flexion intact Incision: dressing C/D/I No cellulitis present Compartment soft  LABS  Recent Labs  08/25/12 0550  HGB 10.6*  HCT 30.4*  WBC 6.4  PLT 83*     Recent Labs  08/25/12 0550  NA 137  K 4.3  BUN 11  CREATININE 0.80  GLUCOSE 168*     Assessment/Plan: 1 Day Post-Op Procedure(s) (LRB): LEFT TOTAL HIP ARTHROPLASTY ANTERIOR APPROACH (Left) HV drain d/c'ed Foley cath d/c'ed Changed Methadone to her regular home dose. Advance diet Up with therapy D/C IV fluids Discharge home with home health eventually, possibly Thursday if ready  Expected ABLA  Treated with iron and will observe  Obese (BMI 30-39.9) Estimated body mass index is 31.32 kg/(m^2) as calculated from the following:   Height as of this encounter: 5\' 7"  (1.702 m).   Weight as of this encounter: 90.719 kg (200 lb). Patient also counseled that weight may inhibit the healing process Patient counseled that losing weight will help with future health issues      [name]. [name]   PAC  08/25/2012, 10:08 AM

## 2012-08-25 NOTE — Evaluation (Signed)
Physical Therapy Evaluation Patient Details Name: [name] MRN: [id number] DOB: Oct 06, 1960 Today's Date: 08/25/2012 Time: 7829-5621 PT Time Calculation (min): 15 min  PT Assessment / Plan / Recommendation Clinical Impression  Pt presents s/p L THA POD 1 with decreased strength, ROM and mobility.  Note she has history of polysubstance abuse and bipolar disorder.  Tolerated OOB to chair, however with increased pain and unable to ambulate any more than 5'.  Pt will benefit from skilled PT in acute venue to address deficits.  PT had discussion with pt about D/C plans and pt feels that she will be fine tomorrow and will have help at home.  She does not seem to understand the score of her deficits at this time.  she also states that a walker will not fit inside of her restroom at home.  PT recommends SNF in order to ensure safety of pt and do not feel that she will be safe at home.     PT Assessment  Patient needs continued PT services    Follow Up Recommendations  Home health PT;SNF;Supervision/Assistance - 24 hour    Does the patient have the potential to tolerate intense rehabilitation      Barriers to Discharge Decreased caregiver support, inaccessible bathroom.      Equipment Recommendations   (Pt would like quad cane, however feel that HHPT should assess before ordering.  PT feels that pt may use quad cane at home unsafely)    Recommendations for Other Services OT consult   Frequency 7X/week    Precautions / Restrictions Precautions Precautions: Fall Restrictions Weight Bearing Restrictions: No Other Position/Activity Restrictions: WBAT   Pertinent Vitals/Pain Max c/o pain during session.       Mobility  Bed Mobility Bed Mobility: Supine to Sit Supine to Sit: 4: Min assist;HOB elevated;With rails Details for Bed Mobility Assistance: Requires assist for LLE out of bed with MAX cues for hand placement on bed to better simulate home environment.  Pt insisted on using  handrails during eval.  Transfers Transfers: Sit to Stand;Stand to Sit Sit to Stand: 1: +2 Total assist;With upper extremity assist;From bed Sit to Stand: Patient Percentage: 60% Stand to Sit: 1: +2 Total assist;With upper extremity assist;With armrests;To chair/3-in-1 Stand to Sit: Patient Percentage: 60% Details for Transfer Assistance: MAX cues for hand placement and LE management.  Pt somewhat impulsive with mobility and requires max cues for safety as well.  Ambulation/Gait Ambulation/Gait Assistance: 1: +2 Total assist Ambulation/Gait: Patient Percentage: 50% Ambulation Distance (Feet): 5 Feet Assistive device: Rolling walker Ambulation/Gait Assistance Details: Assist to steady throughout with MAX cues for sequencing/technique with RW and to maintain upright posture.  Pt states that she is in too much pain to go any further and needs to sit down.  Assisted pt back to chair.  Gait Pattern: Step-to pattern;Decreased stride length;Antalgic;Trunk flexed Gait velocity: decreased Stairs: No Wheelchair Mobility Wheelchair Mobility: No    Exercises     PT Diagnosis: Difficulty walking;Generalized weakness;Acute pain  PT Problem List: Decreased strength;Decreased range of motion;Decreased activity tolerance;Decreased balance;Decreased mobility;Decreased knowledge of use of DME;Decreased safety awareness;Decreased knowledge of precautions;Pain PT Treatment Interventions: DME instruction;Gait training;Functional mobility training;Therapeutic activities;Therapeutic exercise;Balance training;Patient/family education   PT Goals Acute Rehab PT Goals PT Goal Formulation: With patient Time For Goal Achievement: 08/28/12 Potential to Achieve Goals: Good Pt will go Supine/Side to Sit: with supervision PT Goal: Supine/Side to Sit - Progress: Goal set today Pt will go Sit to Supine/Side: with supervision PT Goal: Sit  to Supine/Side - Progress: Goal set today Pt will go Sit to Stand: with  supervision PT Goal: Sit to Stand - Progress: Goal set today Pt will go Stand to Sit: with supervision PT Goal: Stand to Sit - Progress: Goal set today Pt will Ambulate: 51 - 150 feet;with supervision;with least restrictive assistive device PT Goal: Ambulate - Progress: Goal set today Pt will Perform Home Exercise Program: with supervision, verbal cues required/provided PT Goal: Perform Home Exercise Program - Progress: Goal set today  Visit Information  Last PT Received On: 08/25/12 Assistance Needed: +2 (safety)    Subjective Data  Subjective: Are they going to order me a cane? Patient Stated Goal: to go home   Prior Functioning  Home Living Lives With: Daughter Available Help at Discharge: Available 24 hours/day (only for two days) Type of Home: Apartment Home Access: Level entry Home Layout: One level Bathroom Shower/Tub: Engineer, manufacturing systems: Standard Home Adaptive Equipment: Health and safety inspector - rolling Additional Comments: Pt states quad cane is broken.  Prior Function Level of Independence: Independent with assistive device(s) Able to Take Stairs?: Yes Driving: Yes Comments: Pt states that her daughter will be available to assist getting in/out of shower.  Communication Communication: No difficulties    Cognition  Cognition Overall Cognitive Status: Impaired Area of Impairment: Following commands;Safety/judgement;Awareness of errors;Awareness of deficits Arousal/Alertness: Awake/alert Orientation Level: Appears intact for tasks assessed Behavior During Session: [facility] for tasks performed Following Commands: Follows one step commands inconsistently Safety/Judgement: Decreased awareness of safety precautions;Decreased safety judgement for tasks assessed;Impulsive;Decreased awareness of need for assistance Awareness of Errors: Assistance required to identify errors made;Assistance required to correct errors made Cognition - Other Comments: Pt seems  very unaware of her deficits and feels that she will be okay with another night here and once she gets home.  Feel that pt is going to be unsafe at home.     Extremity/Trunk Assessment Right Lower Extremity Assessment RLE ROM/Strength/Tone: WFL for tasks assessed RLE Sensation: WFL - Light Touch Left Lower Extremity Assessment LLE ROM/Strength/Tone: Unable to fully assess;Due to pain;Deficits Trunk Assessment Trunk Assessment: Kyphotic   Balance    End of Session PT - End of Session Equipment Utilized During Treatment: Gait belt Activity Tolerance: Patient limited by pain Patient left: in chair;with call bell/phone within reach;with nursing in room Nurse Communication: Mobility status;Patient requests pain meds  GP     Vista Deck 08/25/2012, 11:50 AM

## 2012-08-25 NOTE — Progress Notes (Signed)
Utilization review completed.  

## 2012-08-26 ENCOUNTER — Encounter (HOSPITAL_COMMUNITY): Payer: Self-pay | Admitting: Orthopedic Surgery

## 2012-08-26 LAB — BASIC METABOLIC PANEL
Calcium: 8.8 mg/dL (ref 8.4–10.5)
GFR calc non Af Amer: >90 mL/min (ref >90)
Sodium: 137 mEq/L (ref 135–145)

## 2012-08-26 LAB — CBC
MCH: 34.9 pg — ABNORMAL HIGH (ref 26.0–34.0)
MCHC: 34.7 g/dL (ref 30.0–36.0)
Platelets: 76 10*3/uL — ABNORMAL LOW (ref 150–400)
RDW: 13.2 % (ref 11.5–15.5)

## 2012-08-26 MED ORDER — FERROUS SULFATE 325 (65 FE) MG PO TABS: 325.0000 mg | ORAL_TABLET | Freq: Three times a day (TID) | ORAL | Status: DC

## 2012-08-26 MED ORDER — OXYCODONE HCL 10 MG PO TABS: 10.0000 mg | ORAL_TABLET | ORAL | Status: DC

## 2012-08-26 MED ORDER — SODIUM CHLORIDE 0.9 % IJ SOLN: 10.0000 mL | Freq: Two times a day (BID) | INTRAMUSCULAR | Status: DC

## 2012-08-26 MED ORDER — POLYETHYLENE GLYCOL 3350 17 G PO PACK: 17.0000 g | PACK | Freq: Two times a day (BID) | ORAL | Status: DC

## 2012-08-26 MED ORDER — TIZANIDINE HCL 4 MG PO CAPS: 4.0000 mg | ORAL_CAPSULE | Freq: Three times a day (TID) | ORAL | Status: DC

## 2012-08-26 MED ORDER — OXYCODONE HCL 5 MG PO TABS
10.0000 mg | ORAL_TABLET | ORAL | Status: DC
  Administered 2012-08-26: 20 mg via ORAL
  Filled 2012-08-26: qty 4

## 2012-08-26 MED ORDER — SODIUM CHLORIDE 0.9 % IJ SOLN
10.0000 mL | INTRAMUSCULAR | Status: DC | PRN
  Administered 2012-08-26: 20 mL

## 2012-08-26 MED ORDER — DSS 100 MG PO CAPS: 100.0000 mg | ORAL_CAPSULE | Freq: Two times a day (BID) | ORAL | Status: DC

## 2012-08-26 NOTE — Progress Notes (Addendum)
   Subjective: 2 Days Post-Op Procedure(s) (LRB): LEFT TOTAL HIP ARTHROPLASTY ANTERIOR APPROACH (Left)   Patient reports pain as moderate, states that she is having back and lateral leg pain. No other events throughout the night. States that she sees Dr. Margo Aye about every 3 months to monitor her blood levels.   Objective:   VITALS:   Filed Vitals:   08/26/12   BP:   Pulse: 84  Temp: 98.2 F (36.8 C)  Resp: 18    Neurovascular intact Dorsiflexion/Plantar flexion intact Incision: dressing C/D/I No cellulitis present Compartment soft  LABS  Recent Labs  08/25/12 0550 08/26/12 0335  HGB 10.6* 9.4*  HCT 30.4* 27.1*  WBC 6.4 6.9  PLT 83* 76*     Recent Labs  08/25/12 0550 08/26/12 0335  NA 137 137  K 4.3 3.9  BUN 11 11  CREATININE 0.80 0.75  GLUCOSE 168* 109*     Assessment/Plan: 2 Days Post-Op Procedure(s) (LRB): LEFT TOTAL HIP ARTHROPLASTY ANTERIOR APPROACH (Left) - Trying to help control he pain a little better we will increase her pain medicine slightly. - Do to her low platelets, but no free bleeding we will have her follow up with her PCP or liver doctor within a weeks - Dr. Margo Aye was called and he stated that her blood issues are from cirrhosis of the liver and that she doesn't need to follow up with him, but her liver doctor or PCP. Also states that her levels aren't low enough for a transfusion at this time. - Up with therapy - Discharge home with home health - Follow up in 2 weeks at [facility]. Follow up with OLIN,Yesli Vanderhoff D in 2 weeks.  Contact information:  [facility] [address], [address] [place] [address] [phone]   - Discussed with Dr. Charlann Boxer and at this time we will also have her hold the ASA for home use. It is highly stressed many times that because of her levels we need to hold her ASA, but because of that she needs to be much more active. Pumping her ankles is discussed as well as  getting up a lot doing therapy exercises. Stressed that she needs to keep the blood moving so the blood doesn't pool and potentially cause blood clots. Will need to continue to use the TED hose also    Expected ABLA  Treated with iron and will observe   Obese (BMI 30-39.9)  Estimated body mass index is 31.32 kg/(m^2) as calculated from the following:     Height as of this encounter: 5\' 7"  (1.702 m).     Weight as of this encounter: 90.719 kg (200 lb).  Patient also counseled that weight may inhibit the healing process  Patient counseled that losing weight will help with future health issues          Anastasio Auerbach. Kassy Mcenroe   PAC  08/26/2012, 9:20 AM

## 2012-08-26 NOTE — Evaluation (Addendum)
Occupational Therapy Evaluation Patient Details Name: [name] MRN: [id number] DOB: 1961/02/05 Today's Date: 08/26/2012 Time: 4098-1191 OT Time Calculation (min): 25 min  OT Assessment / Plan / Recommendation Clinical Impression  Pt is a 52 year old s/p hip surgery and displays increased pain, decreased safety awareness and overall decreased independence with ADL tasks. Will benefit from skilled OT services to improve safety and independence with these tasks.    OT Assessment  Patient needs continued OT Services    Follow Up Recommendations  Home health OT;Supervision/Assistance - 24 hour    Barriers to Discharge      Equipment Recommendations  3 in 1 bedside comode    Recommendations for Other Services    Frequency  Min 2X/week    Precautions / Restrictions Precautions Precautions: Fall Restrictions Weight Bearing Restrictions: No Other Position/Activity Restrictions: WBAT        ADL  Eating/Feeding: Simulated;Independent Where Assessed - Eating/Feeding: Chair Grooming: Simulated;Wash/dry hands;Set up Where Assessed - Grooming: Supported sitting Upper Body Bathing: Simulated;Chest;Right arm;Left arm;Abdomen;Set up Where Assessed - Upper Body Bathing: Unsupported sitting Lower Body Bathing: Simulated;Minimal assistance (no AE) Where Assessed - Lower Body Bathing: Supported sit to stand Upper Body Dressing: Simulated;Set up Where Assessed - Upper Body Dressing: Unsupported sitting Lower Body Dressing: Simulated;Minimal assistance Where Assessed - Lower Body Dressing: Supported sit to stand Toilet Transfer: Performed;Min guard Statistician Method: Other (comment) (transfer into bathroom) Toilet Transfer Equipment: Raised toilet seat with arms (or 3-in-1 over toilet) Toileting - Clothing Manipulation and Hygiene: Performed;Min guard Where Assessed - Engineer, mining and Hygiene: Sit to stand from 3-in-1 or toilet Equipment Used: Rolling walker ADL  Comments: Pt states she used to have reacher and sponge from previous surgery but no longer does. Issued both pieces of AE as pt does have Medicaid. Pt verbalized understanding of how to use AE. Pt states her bathroom is too small and the walker wont fit inside. Discussed use of 3in1 outisde of bathroom as BSC initially until able to progress to a cane with HHPT guidance to get inside bathroom and sponge bathing. Pt agreeable.    OT Diagnosis: Generalized weakness;Acute pain  OT Problem List: Decreased strength OT Treatment Interventions: Self-care/ADL training;Therapeutic activities;DME and/or AE instruction;Patient/family education   OT Goals Acute Rehab OT Goals OT Goal Formulation: With patient Time For Goal Achievement: 09/02/12 Potential to Achieve Goals: Good ADL Goals Pt Will Perform Grooming: with supervision;Standing at sink ADL Goal: Grooming - Progress: Goal set today Pt Will Perform Lower Body Bathing: with supervision;with adaptive equipment;Sit to stand from chair;Sit to stand from bed ADL Goal: Lower Body Bathing - Progress: Goal set today Pt Will Perform Lower Body Dressing: with supervision;with adaptive equipment;Sit to stand from chair;Sit to stand from bed ADL Goal: Lower Body Dressing - Progress: Goal set today Pt Will Transfer to Toilet: with supervision;Ambulation;3-in-1 ADL Goal: Toilet Transfer - Progress: Goal set today Pt Will Perform Toileting - Clothing Manipulation: with supervision;Standing ADL Goal: Toileting - Clothing Manipulation - Progress: Goal set today  Visit Information  Last OT Received On: 08/26/12 Assistance Needed: +1 PT/OT Co-Evaluation/Treatment: Yes    Subjective Data  Subjective: it hurts but I know I need to do this Patient Stated Goal: home   Prior Functioning               Vision/Perception     Cognition  Cognition Overall Cognitive Status: Impaired Area of Impairment: Attention;Safety/judgement Arousal/Alertness:  Awake/alert Orientation Level: Appears intact for tasks assessed Behavior  During Session: [facility] for tasks performed Following Commands: Follows one step commands inconsistently Safety/Judgement: Decreased awareness of safety precautions;Decreased safety judgement for tasks assessed;Impulsive;Decreased awareness of need for assistance Awareness of Errors: Assistance required to identify errors made;Assistance required to correct errors made Cognition - Other Comments: Impulsive and easily loses focus on task    Extremity/Trunk Assessment Right Upper Extremity Assessment RUE ROM/Strength/Tone: Within functional levels Left Upper Extremity Assessment LUE ROM/Strength/Tone: Within functional levels     Mobility Transfers Transfers: Sit to Stand;Stand to Sit Sit to Stand: 4: Min guard;With upper extremity assist;From chair/3-in-1 Stand to Sit: 4: Min guard;With upper extremity assist;To chair/3-in-1 Details for Transfer Assistance: min verbal cues for safety     Exercise     Balance     End of Session OT - End of Session Activity Tolerance: Patient limited by pain Patient left: in chair;with call bell/phone within reach  GO     Lennox Laity [phone] 08/26/2012, 12:45 PM

## 2012-08-26 NOTE — Progress Notes (Signed)
Discharged from floor via w/c, daughter with pt. No changes in assessment.  [name]  

## 2012-08-26 NOTE — Progress Notes (Signed)
Physical Therapy Treatment Patient Details Name: [name] MRN: [id number] DOB: [birth year] Today's Date: 08/26/2012 Time: 4098-1191 PT Time Calculation (min): 19 min  PT Assessment / Plan / Recommendation Comments on Treatment Session       Follow Up Recommendations  Home health PT;SNF;Supervision/Assistance - 24 hour     Does the patient have the potential to tolerate intense rehabilitation     Barriers to Discharge        Equipment Recommendations  None recommended by PT    Recommendations for Other Services OT consult  Frequency 7X/week   Plan Discharge plan remains appropriate    Precautions / Restrictions Precautions Precautions: Fall Restrictions Weight Bearing Restrictions: No Other Position/Activity Restrictions: WBAT   Pertinent Vitals/Pain 6-7/10, premed, RN aware, ice pack provided    Mobility  Transfers Transfers: Sit to Stand;Stand to Sit Sit to Stand: 4: Min guard Stand to Sit: 4: Min guard Details for Transfer Assistance: Performed x 2 in order to use 3in1 in restroom with max cues for hand placement and safety.  Pt stood impulsively without RW being in front of her.  Ambulation/Gait Ambulation/Gait Assistance: 4: Min guard Ambulation Distance (Feet): 123 Feet Assistive device: Rolling walker Ambulation/Gait Assistance Details: cues for posture, position from RW and increased DF on L Gait Pattern: Step-to pattern;Step-through pattern;Decreased stance time - left Gait velocity: cues to slow down Stairs: No    Exercises     PT Diagnosis:    PT Problem List:   PT Treatment Interventions:     PT Goals Acute Rehab PT Goals PT Goal Formulation: With patient Time For Goal Achievement: 08/28/12 Potential to Achieve Goals: Good Pt will go Supine/Side to Sit: with supervision PT Goal: Supine/Side to Sit - Progress: Progressing toward goal Pt will go Sit to Supine/Side: with supervision PT Goal: Sit to Supine/Side - Progress: Progressing toward  goal Pt will go Sit to Stand: with supervision PT Goal: Sit to Stand - Progress: Progressing toward goal Pt will go Stand to Sit: with supervision PT Goal: Stand to Sit - Progress: Progressing toward goal Pt will Ambulate: 51 - 150 feet;with supervision;with least restrictive assistive device PT Goal: Ambulate - Progress: Progressing toward goal Pt will Perform Home Exercise Program: with supervision, verbal cues required/provided  Visit Information  Last PT Received On: 08/26/12 Assistance Needed: +1 PT/OT Co-Evaluation/Treatment: Yes    Subjective Data  Subjective: It hurts but I know I have to do this Patient Stated Goal: to go home   Cognition  Cognition Overall Cognitive Status: Impaired Area of Impairment: Safety/judgement Arousal/Alertness: Awake/alert Orientation Level: Appears intact for tasks assessed Behavior During Session: [facility] for tasks performed Following Commands: Follows one step commands inconsistently Safety/Judgement: Decreased awareness of safety precautions;Decreased safety judgement for tasks assessed;Impulsive;Decreased awareness of need for assistance Awareness of Errors: Assistance required to identify errors made;Assistance required to correct errors made Cognition - Other Comments: Impulsive and easily loses focus on task    Balance     End of Session PT - End of Session Equipment Utilized During Treatment: Gait belt Activity Tolerance: Patient limited by pain Patient left: in chair;with call bell/phone within reach Nurse Communication: Mobility status   GP     [name] 08/26/2012, 12:06 PM

## 2012-08-26 NOTE — Care Management Note (Signed)
    Page 1 of 2   08/26/2012     2:39:30 PM   CARE MANAGEMENT NOTE 08/26/2012  Patient:  [name], [name]   Account Number:  [id number]  Date Initiated:  08/25/2012  Documentation initiated by:  Colleen Can  Subjective/Objective Assessment:   dx avascular necrosisleft hip: total hip replacemnt-anterior approach    Pt was referred to [facility] for [facility] services by MD office. Services will start day after discharge.     Action/Plan:   CM spoke with patient. Plans are for her to return to her home in [place] where daughter will be caregiver. She already has RW & shower chair. Wants 4 prong cane and commde chair. Wants [facility]evieve Norlander for [facility] services.   Anticipated DC Date:  08/27/2012   Anticipated DC Plan:  HOME W HOME HEALTH SERVICES      DC Planning Services  CM consult      [facility] Choice  HOME HEALTH  DURABLE MEDICAL EQUIPMENT   Choice offered to / List presented to:  C-1 Patient   DME arranged  3-N-1  CANE      DME agency  [facility]     HH arranged  HH-2 PT      [facility] agency  [facility]   Status of service:  Completed, signed off Medicare Important Message given?  NA - LOS <3 / Initial given by admissions (If response is "NO", the following Medicare IM given date fields will be blank) Date Medicare IM given:   Date Additional Medicare IM given:    Discharge Disposition:  HOME W HOME HEALTH SERVICES  Per UR Regulation:    If discussed at Long Length of Stay Meetings, dates discussed:    Comments:

## 2012-08-30 NOTE — Discharge Summary (Signed)
Physician Discharge Summary  Patient ID: ABAGAYLE KLUTTS MRN: [id number] DOB/AGE: 06-19-1960 52 y.o.  Admit date: 08/24/2012 Discharge date: 08/26/2012   Procedures:  Procedure(s) (LRB): LEFT TOTAL HIP ARTHROPLASTY ANTERIOR APPROACH (Left)  Attending Physician:  Dr. Durene Romans   Admission Diagnoses:   Left hip AVN / pain  Discharge Diagnoses:  Principal Problem:   S/P left THA, AA Active Problems:   Expected blood loss anemia   Obesity (BMI 30-39.9)  Past Medical History  Diagnosis Date  . Chronic pain syndrome   . Cervical radiculopathy   . Osteoarthritis   . GERD (gastroesophageal reflux disease)   . Thrombocytopenia   . Leukocytopenia   . Peptic ulcer disease   . DJD (degenerative joint disease)   . Polysubstance (excluding opioids) dependence   . Anxiety   . Blood transfusion without reported diagnosis   . Osteoporosis   . Bipolar affective disorder     ADHD AND SCHZIOPHRENIC--PT GOES TO [facility] MENT[facility] - DR. BLANKMAN  . Shortness of breath     WITH EXERTION; SMOKER  . Pneumonia 2013  . H/O hiatal hernia   . Seizures     SEIZURE AFTER BREATHING TX (ALBUTEROL) IN ER; ALSO PAST HX SEIZURES DURING DRUG DETOX  . Hepatitis C infection     STATES COMPLETED TREATMENT AND NO LONGER HAS INFECTION  . Complication of anesthesia     STATES SHE WOKE UP TWICE DURING SURGERIES    HPI: [name], 52 y.o. female, has a history of pain and functional disability in the left hip(s) due to arthritis and patient has failed non-surgical conservative treatments for greater than 12 weeks to include corticosteriod injections, use of assistive devices and activity modification. Onset of symptoms was gradual starting 3 years ago with rapidlly worsening course since that time.The patient noted no past surgery on the left hip(s). Patient currently rates pain in the left hip at 10 out of 10 with activity. Patient has night pain, worsening of pain with activity and weight bearing,  trendelenberg gait, pain that interfers with activities of daily living and pain with passive range of motion. Patient has evidence of periarticular osteophytes and joint space narrowing by imaging studies. This condition presents safety issues increasing the risk of falls. There is no current active infection. Risks, benefits and expectations were discussed with the patient. Patient understand the risks, benefits and expectations and wishes to proceed with surgery.   PCP: Jearld Lesch, MD   Discharged Condition: good  Hospital Course:  Patient underwent the above stated procedure on 08/24/2012. Patient tolerated the procedure well and brought to the recovery room in good condition and subsequently to the floor.  POD #1 BP: 176/94 ; Pulse: 78 ; Temp: 98.3 F (36.8 C) ; Resp: 18  Pt's foley was removed, as well as the hemovac drain removed. IV was changed to a saline lock. Patient reports pain as moderate, she knows that she has a higher tolerance because of the methadone. No events throughout the night.  Neurovascular intact, dorsiflexion/plantar flexion intact, incision: dressing C/D/I, no cellulitis present and compartment soft.   LABS  Basename  08/25/12    0550   HGB  10.6  HCT  30.4   POD #2  Pulse: 84 ; Temp: 98.2 F (36.8 C) ; Resp: 18  Patient reports pain as moderate, states that she is having back and lateral leg pain. No other events throughout the night. States that she sees Dr. Margo Aye about every 3 months  to monitor her blood levels.  Neurovascular intact, dorsiflexion/plantar flexion intact, incision: dressing C/D/I, no cellulitis present and compartment soft.   LABS  Basename  08/26/12    0335   HGB  9.4  HCT  27.1    Discharge Exam: General appearance: alert, cooperative and no distress Extremities: Homans sign is negative, no sign of DVT, no edema, redness or tenderness in the calves or thighs and no ulcers, gangrene or trophic changes  Disposition:   Home-Health  Care Svc with follow up in 2 weeks   Follow-up Information   Follow up with Shelda Pal, MD. Schedule an appointment as soon as possible for a visit in 2 weeks.   Contact information:   [address] [place] [address] [phone]       Follow up with Jearld Lesch, MD. Schedule an appointment as soon as possible for a visit in 1 week. (Should see Dr. Mayford Knife within a week for blood work and eval of low platelets)    Contact information:   [address] [place] [address] [phone]       Discharge Orders   Future Appointments Provider Department Dept Phone   09/22/2012 2:00 PM Krista Blue [facility] MEDICAL ONCOLOGY [phone]   11/22/2012 2:30 PM Windell [facility] MEDICAL ONCOLOGY [phone]   05/25/2013 1:30 PM Windell Hum[facility]bird [facility] MEDICAL ONCOLOGY [phone]   05/25/2013 2:00 PM Exie Parody, MD [facility] MEDICAL ONCOLOGY [phone]   Future Orders Complete By Expires     Call MD / Call 911  As directed     Comments:      If you experience chest pain or shortness of breath, CALL 911 and be transported to the hospital emergency room.  If you develope a fever above 101 F, pus (white drainage) or increased drainage or redness at the wound, or calf pain, call your surgeon's office.    Change dressing  As directed     Comments:      Maintain surgical dressing for 10-14 days, then replace with 4x4 guaze and tape. Keep the area dry and clean.    Constipation Prevention  As directed     Comments:      Drink plenty of fluids.  Prune juice may be helpful.  You may use a stool softener, such as Colace (over the counter) 100 mg twice a day.  Use MiraLax (over the counter) for constipation as needed.    Diet - low sodium heart healthy  As directed     Discharge instructions  As directed     Comments:      Maintain surgical dressing for 10-14 days, then replace with  gauze and tape. Keep the area dry and clean until follow up. Follow up in 2 weeks at [facility] Call with any questions or concerns.    Increase activity slowly as tolerated  As directed     TED hose  As directed     Comments:      Use stockings (TED hose) for 2 weeks on both leg(s).  You may remove them at night for sleeping.    Weight bearing as tolerated  As directed          Medication List    TAKE these medications       amphetamine-dextroamphetamine 20 MG tablet  Commonly known as:  ADDERALL  Take 20 mg by mouth 3 (three) times  daily. For add  PT STATES SHE SOMETIMES TAKES JUST ONE OR TWO A DAY     calcium carbonate 500 MG chewable tablet  Commonly known as:  TUMS - dosed in mg elemental calcium  Chew 1-2 tablets by mouth as needed. FOR HEARTBURN     DSS 100 MG Caps  Take 100 mg by mouth 2 (two) times daily.     ferrous sulfate 325 (65 FE) MG tablet  Take 1 tablet (325 mg total) by mouth 3 (three) times daily after meals.     guaiFENesin 600 MG 12 hr tablet  Commonly known as:  MUCINEX  Take 600 mg by mouth. WOULD TAKE IF NEEDED     methadone 10 MG/ML solution  Commonly known as:  DOLOPHINE  Take by mouth daily. PT NOW TAKING 95 MG DAILY IN AM     Oxycodone HCl 10 MG Tabs  Take 1-2 tablets (10-20 mg total) by mouth every 4 (four) hours.     polyethylene glycol packet  Commonly known as:  MIRALAX / GLYCOLAX  Take 17 g by mouth 2 (two) times daily.     QUEtiapine 300 MG tablet  Commonly known as:  SEROQUEL  Take 100 mg by mouth at bedtime. PT TAKES 1/3 TABLET AT BEDTIME     tiZANidine 4 MG capsule  Commonly known as:  ZANAFLEX  Take 1 capsule (4 mg total) by mouth 3 (three) times daily. Muscle spasms         Signed: Anastasio Auerbach. Shavy Beachem   PAC  08/30/2012, 10:03 AM

## 2012-09-22 ENCOUNTER — Telehealth: Payer: Self-pay | Admitting: *Deleted

## 2012-09-22 ENCOUNTER — Other Ambulatory Visit (HOSPITAL_BASED_OUTPATIENT_CLINIC_OR_DEPARTMENT_OTHER): Payer: Medicare Other | Admitting: Lab

## 2012-09-22 DIAGNOSIS — D696 Thrombocytopenia, unspecified: Secondary | ICD-10-CM

## 2012-09-22 DIAGNOSIS — B1920 Unspecified viral hepatitis C without hepatic coma: Secondary | ICD-10-CM

## 2012-09-22 LAB — CBC WITH DIFFERENTIAL/PLATELET
BASO%: 0.3 % (ref 0.0–2.0)
EOS%: 2.4 % (ref 0.0–7.0)
HCT: 32.2 % — ABNORMAL LOW (ref 34.8–46.6)
HGB: 10.4 g/dL — ABNORMAL LOW (ref 11.6–15.9)
MCH: 33.4 pg (ref 25.1–34.0)
MCHC: 32.3 g/dL (ref 31.5–36.0)
MONO#: 0.2 10*3/uL (ref 0.1–0.9)
RDW: 14.6 % — ABNORMAL HIGH (ref 11.2–14.5)
WBC: 2.9 10*3/uL — ABNORMAL LOW (ref 3.9–10.3)
lymph#: 1.2 10*3/uL (ref 0.9–3.3)
nRBC: 0 % (ref 0–0)

## 2012-09-22 NOTE — Telephone Encounter (Signed)
Message copied by Wende Mott on Wed Sep 22, 2012  4:07 PM ------      Message from: Clenton Pare R      Created: Wed Sep 22, 2012  2:48 PM       Please call pt. CBC remains stable. ------

## 2012-09-22 NOTE — Telephone Encounter (Signed)
Informed pt of CBC stable per Belenda Cruise and to keep next lab as scheduled in June.  She verbalized understanding.

## 2012-11-22 ENCOUNTER — Other Ambulatory Visit: Payer: Medicare Other | Admitting: Lab

## 2012-11-25 ENCOUNTER — Other Ambulatory Visit: Payer: Self-pay | Admitting: *Deleted

## 2012-11-29 ENCOUNTER — Telehealth: Payer: Self-pay | Admitting: Oncology

## 2012-11-29 NOTE — Telephone Encounter (Signed)
lvm for opt regarding to r/s lab....mailed pat letter and avs..Marland Kitchen

## 2012-12-06 ENCOUNTER — Other Ambulatory Visit: Payer: Medicare Other | Admitting: Lab

## 2013-05-25 ENCOUNTER — Other Ambulatory Visit: Payer: Medicare Other

## 2013-05-25 ENCOUNTER — Encounter: Payer: Medicare Other | Admitting: Hematology and Oncology

## 2013-05-25 ENCOUNTER — Encounter: Payer: Self-pay | Admitting: *Deleted

## 2013-05-25 NOTE — Progress Notes (Signed)
This encounter was created in error - please disregard.

## 2014-08-14 ENCOUNTER — Other Ambulatory Visit: Payer: Self-pay | Admitting: Orthopedic Surgery

## 2014-08-14 ENCOUNTER — Emergency Department (INDEPENDENT_AMBULATORY_CARE_PROVIDER_SITE_OTHER)
Admission: EM | Admit: 2014-08-14 | Discharge: 2014-08-14 | Disposition: A | Discharge status: Home / Self Care | Payer: Medicare Other | Source: Home / Self Care | Attending: Emergency Medicine | Admitting: Emergency Medicine

## 2014-08-14 ENCOUNTER — Encounter (HOSPITAL_COMMUNITY): Payer: Self-pay

## 2014-08-14 DIAGNOSIS — R52 Pain, unspecified: Secondary | ICD-10-CM

## 2014-08-14 DIAGNOSIS — I10 Essential (primary) hypertension: Secondary | ICD-10-CM

## 2014-08-14 DIAGNOSIS — G4700 Insomnia, unspecified: Secondary | ICD-10-CM

## 2014-08-14 MED ORDER — QUETIAPINE FUMARATE 300 MG PO TABS: 300.0000 mg | ORAL_TABLET | Freq: Every day | ORAL | Status: DC

## 2014-08-14 MED ORDER — LISINOPRIL 5 MG PO TABS: 5.0000 mg | ORAL_TABLET | Freq: Every day | ORAL | Status: AC

## 2014-08-14 NOTE — Discharge Instructions (Signed)
You need a good primary care provider. You may call Dr. Ival Bible office, she may be able to see you within a few weeks. I do not know whether she will be able to refill your psychiatric medications until you get into the psychiatrist. You may also try [facility] family physicians.     Hypertension Hypertension, commonly called high blood pressure, is when the force of blood pumping through your arteries is too strong. Your arteries are the blood vessels that carry blood from your heart throughout your body. A blood pressure reading consists of a higher number over a lower number, such as 110/72. The higher number (systolic) is the pressure inside your arteries when your heart pumps. The lower number (diastolic) is the pressure inside your arteries when your heart relaxes. Ideally you want your blood pressure below 120/80. Hypertension forces your heart to work harder to pump blood. Your arteries may become narrow or stiff. Having hypertension puts you at risk for heart disease, stroke, and other problems.  RISK FACTORS Some risk factors for high blood pressure are controllable. Others are not.  Risk factors you cannot control include:   Race. You may be at higher risk if you are African American.  Age. Risk increases with age.  Gender. Men are at higher risk than women before age 78 years. After age 91, women are at higher risk than men. Risk factors you can control include:  Not getting enough exercise or physical activity.  Being overweight.  Getting too much fat, sugar, calories, or salt in your diet.  Drinking too much alcohol. SIGNS AND SYMPTOMS Hypertension does not usually cause signs or symptoms. Extremely high blood pressure (hypertensive crisis) may cause headache, anxiety, shortness of breath, and nosebleed. DIAGNOSIS  To check if you have hypertension, your health care provider will measure your blood pressure while you are seated, with your arm held at the level of your heart. It  should be measured at least twice using the same arm. Certain conditions can cause a difference in blood pressure between your right and left arms. A blood pressure reading that is higher than normal on one occasion does not mean that you need treatment. If one blood pressure reading is high, ask your health care provider about having it checked again. TREATMENT  Treating high blood pressure includes making lifestyle changes and possibly taking medicine. Living a healthy lifestyle can help lower high blood pressure. You may need to change some of your habits. Lifestyle changes may include:  Following the DASH diet. This diet is high in fruits, vegetables, and whole grains. It is low in salt, red meat, and added sugars.  Getting at least 2 hours of brisk physical activity every week.  Losing weight if necessary.  Not smoking.  Limiting alcoholic beverages.  Learning ways to reduce stress. If lifestyle changes are not enough to get your blood pressure under control, your health care provider may prescribe medicine. You may need to take more than one. Work closely with your health care provider to understand the risks and benefits. HOME CARE INSTRUCTIONS  Have your blood pressure rechecked as directed by your health care provider.   Take medicines only as directed by your health care provider. Follow the directions carefully. Blood pressure medicines must be taken as prescribed. The medicine does not work as well when you skip doses. Skipping doses also puts you at risk for problems.   Do not smoke.   Monitor your blood pressure at home as directed by  your health care provider. SEEK MEDICAL CARE IF:   You think you are having a reaction to medicines taken.  You have recurrent headaches or feel dizzy.  You have swelling in your ankles.  You have trouble with your vision. SEEK IMMEDIATE MEDICAL CARE IF:  You develop a severe headache or confusion.  You have unusual weakness,  numbness, or feel faint.  You have severe chest or abdominal pain.  You vomit repeatedly.  You have trouble breathing. MAKE SURE YOU:   Understand these instructions.  Will watch your condition.  Will get help right away if you are not doing well or get worse. Document Released: 05/12/2005 Document Revised: 09/26/2013 Document Reviewed: 03/04/2013 [facility] Patient Information 2015 [place], [place]. This information is not intended to replace advice given to you by your health care provider. Make sure you discuss any questions you have with your health care provider.

## 2014-08-14 NOTE — ED Notes (Addendum)
Needs new Rx for her medications including adderal  . Was reportedly sent by Dr Jimmye Norman, her PCP

## 2014-08-14 NOTE — ED Provider Notes (Signed)
CSN: [id number]     Arrival date & time 08/14/14  1544 History   First MD Initiated Contact with Patient 08/14/14 1823     Chief Complaint  Patient presents with  . Medication Refill   (Consider location/radiation/quality/duration/timing/severity/associated sxs/prior Treatment) HPI         54 year old female presents requesting refill of her lisinopril, Seroquel, and Adderall. She has been out of these medications for 2 weeks. She just moved to the area 2-3 weeks ago, she has been trying to find a psychiatrist but has been unable to do so. She just established care with a primary care provider earlier today with a told her that they could not refill any of her medications and to come here to get them all refilled. She has been very anxious because she has been off of her medications. No SI or HI. No chest pain or shortness of breath    Past Medical History  Diagnosis Date  . Chronic pain syndrome   . Cervical radiculopathy   . Osteoarthritis   . GERD (gastroesophageal reflux disease)   . Thrombocytopenia   . Leukocytopenia   . Peptic ulcer disease   . DJD (degenerative joint disease)   . Polysubstance (excluding opioids) dependence   . Anxiety   . Blood transfusion without reported diagnosis   . Osteoporosis   . Bipolar affective disorder     ADHD AND SCHZIOPHRENIC--PT GOES TO [place]. BLANKMAN  . Shortness of breath     WITH EXERTION; SMOKER  . Pneumonia 2013  . H/O hiatal hernia   . Seizures     SEIZURE AFTER BREATHING TX (ALBUTEROL) IN ER; ALSO PAST HX SEIZURES DURING DRUG DETOX  . Hepatitis C infection     STATES COMPLETED TREATMENT AND NO LONGER HAS INFECTION  . Complication of anesthesia     STATES SHE WOKE UP TWICE DURING SURGERIES   Past Surgical History  Procedure Laterality Date  . Tonsilectomy, adenoidectomy, bilateral myringotomy and tubes    . Right knee arthroscopic surgery    . Total hip replacement    . Appendectomy    . Hyperectomy      due to obstructive ovarian cytst  . Spine surgery      CERVIAL 6-7 SURGERY - FUSION  . Abdominal hysterectomy    . Joint replacement  2012    RIGHT TOTAL HIP REPLACEMENT  . Cholecystectomy    . Total hip arthroplasty Left 08/24/2012    Procedure: LEFT TOTAL HIP ARTHROPLASTY ANTERIOR APPROACH;  Surgeon: [name], MD;  Location: [facility];  Service: Orthopedics;  Laterality: Left;   Family History  Problem Relation Age of Onset  . Breast cancer Sister   . Cancer Sister   . Heart disease Mother   . Heart disease Father    History  Substance Use Topics  . Smoking status: Current Some Day Smoker -- 0.50 packs/day for 20 years    Types: Cigarettes  . Smokeless tobacco: Never Used  . Alcohol Use: No     Comment: HX OF ABUSE OF PAIN MEDS--PT IS CURRENTLY IN TREATMENT AT [facility] IN [place]   OB History    No data available     Review of Systems  Constitutional: Negative for fever and chills.  Eyes: Negative for visual disturbance.  Respiratory: Negative for cough and shortness of breath.   Cardiovascular: Negative for chest pain, palpitations and leg swelling.  Gastrointestinal: Negative for nausea, vomiting and abdominal pain.  Endocrine: Negative for polydipsia and polyuria.  Genitourinary: Negative for dysuria, urgency and frequency.  Musculoskeletal: Negative for myalgias and arthralgias.  Skin: Negative for rash.  Neurological: Negative for dizziness, weakness and light-headedness.  Psychiatric/Behavioral: Positive for agitation. The patient is nervous/anxious.   All other systems reviewed and are negative.   Allergies  Albuterol; Aspirin; and Adhesive  Home Medications   Prior to Admission medications   Medication Sig Start Date End Date Taking? Authorizing Provider  amphetamine-dextroamphetamine (ADDERALL) 20 MG tablet Take 20 mg by mouth 3 (three) times daily. For add PT STATES SHE SOMETIMES TAKES JUST ONE OR TWO A DAY   Yes Historical  Provider, MD  calcium carbonate (TUMS - DOSED IN MG ELEMENTAL CALCIUM) 500 MG chewable tablet Chew 1-2 tablets by mouth as needed. FOR HEARTBURN   Yes Historical Provider, MD  docusate sodium 100 MG CAPS Take 100 mg by mouth 2 (two) times daily. 08/26/12  Yes Danae Orleans, PA-C  ferrous sulfate 325 (65 FE) MG tablet Take 1 tablet (325 mg total) by mouth 3 (three) times daily after meals. 08/26/12  Yes Danae Orleans, PA-C  guaiFENesin (MUCINEX) 600 MG 12 hr tablet Take 600 mg by mouth. WOULD TAKE IF NEEDED   Yes Historical Provider, MD  oxyCODONE 10 MG TABS Take 1-2 tablets (10-20 mg total) by mouth every 4 (four) hours. 08/26/12  Yes Matthew Babish, PA-C  QUEtiapine (SEROQUEL) 300 MG tablet Take 600 mg by mouth at bedtime.    Yes Historical Provider, MD  tiZANidine (ZANAFLEX) 4 MG capsule Take 1 capsule (4 mg total) by mouth 3 (three) times daily. Muscle spasms 08/26/12  Yes Danae Orleans, PA-C  lisinopril (PRINIVIL,ZESTRIL) 5 MG tablet Take 1 tablet (5 mg total) by mouth daily. 08/14/14   Liam Graham, PA-C  methadone (DOLOPHINE) 10 MG/ML solution Take by mouth daily. PT NOW TAKING 95 MG DAILY IN AM    Historical Provider, MD  polyethylene glycol (MIRALAX / GLYCOLAX) packet Take 17 g by mouth 2 (two) times daily. 08/26/12   Danae Orleans, PA-C  QUEtiapine (SEROQUEL) 300 MG tablet Take 1 tablet (300 mg total) by mouth at bedtime. 08/14/14   Freeman Caldron Zerline Melchior, PA-C   BP 156/92 mmHg  Pulse 87  Temp(Src) 98 F (36.7 C) (Oral)  Resp 14  SpO2 100% Physical Exam  Constitutional: She is oriented to person, place, and time. Vital signs are normal. She appears well-developed and well-nourished. No distress.  HENT:  Head: Normocephalic and atraumatic.  Pulmonary/Chest: Effort normal. No respiratory distress.  Neurological: She is alert and oriented to person, place, and time. She has normal strength. Coordination normal.  Skin: Skin is warm and dry. No rash noted. She is not diaphoretic.  Psychiatric:  Judgment and thought content normal. Her mood appears anxious. Her speech is rapid and/or pressured. She is agitated. Cognition and memory are normal.  Nursing note and vitals reviewed.   ED Course  Procedures (including critical care time) Labs Review Labs Reviewed - No data to display  Imaging Review No results found.   MDM   1. Essential hypertension   2. Insomnia     informed her that we do not refill controlled substances. I'm happy to refill her lisinopril and her Seroquel today but we will not be refilling this again, she is to follow-up with primary care. I discussed with her multiple options for establishing a primary care provider, she will work on this.  Meds ordered this encounter  Medications  . lisinopril (  PRINIVIL,ZESTRIL) 5 MG tablet    Sig: Take 1 tablet (5 mg total) by mouth daily.    Dispense:  30 tablet    Refill:  0  . QUEtiapine (SEROQUEL) 300 MG tablet    Sig: Take 1 tablet (300 mg total) by mouth at bedtime.    Dispense:  30 tablet    Refill:  0       Liam Graham, PA-C 08/14/14 2006

## 2014-08-27 ENCOUNTER — Ambulatory Visit
Admit: 2014-08-27 | Discharge: 2014-08-27 | Disposition: A | Discharge status: Home / Self Care | Payer: Medicare Other | Attending: Orthopedic Surgery | Admitting: Orthopedic Surgery

## 2014-08-27 DIAGNOSIS — R52 Pain, unspecified: Secondary | ICD-10-CM

## 2014-10-02 ENCOUNTER — Encounter (HOSPITAL_COMMUNITY): Payer: Self-pay | Admitting: Emergency Medicine

## 2014-10-02 DIAGNOSIS — F319 Bipolar disorder, unspecified: Secondary | ICD-10-CM | POA: Diagnosis present

## 2014-10-02 DIAGNOSIS — Y929 Unspecified place or not applicable: Secondary | ICD-10-CM | POA: Insufficient documentation

## 2014-10-02 DIAGNOSIS — B1920 Unspecified viral hepatitis C without hepatic coma: Secondary | ICD-10-CM | POA: Diagnosis present

## 2014-10-02 DIAGNOSIS — M810 Age-related osteoporosis without current pathological fracture: Secondary | ICD-10-CM | POA: Diagnosis present

## 2014-10-02 DIAGNOSIS — D6959 Other secondary thrombocytopenia: Secondary | ICD-10-CM | POA: Diagnosis present

## 2014-10-02 DIAGNOSIS — W57XXXA Bitten or stung by nonvenomous insect and other nonvenomous arthropods, initial encounter: Secondary | ICD-10-CM | POA: Insufficient documentation

## 2014-10-02 DIAGNOSIS — K7460 Unspecified cirrhosis of liver: Secondary | ICD-10-CM | POA: Diagnosis present

## 2014-10-02 DIAGNOSIS — I10 Essential (primary) hypertension: Secondary | ICD-10-CM | POA: Insufficient documentation

## 2014-10-02 DIAGNOSIS — Y999 Unspecified external cause status: Secondary | ICD-10-CM | POA: Insufficient documentation

## 2014-10-02 DIAGNOSIS — L02413 Cutaneous abscess of right upper limb: Secondary | ICD-10-CM | POA: Diagnosis present

## 2014-10-02 DIAGNOSIS — F419 Anxiety disorder, unspecified: Secondary | ICD-10-CM | POA: Diagnosis present

## 2014-10-02 DIAGNOSIS — Z888 Allergy status to other drugs, medicaments and biological substances status: Secondary | ICD-10-CM

## 2014-10-02 DIAGNOSIS — Z720 Tobacco use: Secondary | ICD-10-CM

## 2014-10-02 DIAGNOSIS — K219 Gastro-esophageal reflux disease without esophagitis: Secondary | ICD-10-CM | POA: Diagnosis present

## 2014-10-02 DIAGNOSIS — Z886 Allergy status to analgesic agent status: Secondary | ICD-10-CM

## 2014-10-02 DIAGNOSIS — E875 Hyperkalemia: Secondary | ICD-10-CM | POA: Diagnosis present

## 2014-10-02 DIAGNOSIS — Z9109 Other allergy status, other than to drugs and biological substances: Secondary | ICD-10-CM

## 2014-10-02 DIAGNOSIS — L03113 Cellulitis of right upper limb: Secondary | ICD-10-CM | POA: Diagnosis not present

## 2014-10-02 DIAGNOSIS — Z79891 Long term (current) use of opiate analgesic: Secondary | ICD-10-CM

## 2014-10-02 DIAGNOSIS — M1990 Unspecified osteoarthritis, unspecified site: Secondary | ICD-10-CM | POA: Diagnosis present

## 2014-10-02 DIAGNOSIS — Z96641 Presence of right artificial hip joint: Secondary | ICD-10-CM | POA: Diagnosis present

## 2014-10-02 DIAGNOSIS — Y939 Activity, unspecified: Secondary | ICD-10-CM

## 2014-10-02 DIAGNOSIS — Z803 Family history of malignant neoplasm of breast: Secondary | ICD-10-CM

## 2014-10-02 DIAGNOSIS — M7989 Other specified soft tissue disorders: Secondary | ICD-10-CM | POA: Diagnosis not present

## 2014-10-02 DIAGNOSIS — D638 Anemia in other chronic diseases classified elsewhere: Secondary | ICD-10-CM | POA: Diagnosis present

## 2014-10-02 DIAGNOSIS — G894 Chronic pain syndrome: Secondary | ICD-10-CM | POA: Insufficient documentation

## 2014-10-02 DIAGNOSIS — Z8711 Personal history of peptic ulcer disease: Secondary | ICD-10-CM

## 2014-10-02 DIAGNOSIS — N179 Acute kidney failure, unspecified: Secondary | ICD-10-CM | POA: Diagnosis present

## 2014-10-02 DIAGNOSIS — S50861A Insect bite (nonvenomous) of right forearm, initial encounter: Secondary | ICD-10-CM | POA: Insufficient documentation

## 2014-10-02 DIAGNOSIS — F909 Attention-deficit hyperactivity disorder, unspecified type: Secondary | ICD-10-CM | POA: Diagnosis present

## 2014-10-02 DIAGNOSIS — Z79899 Other long term (current) drug therapy: Secondary | ICD-10-CM

## 2014-10-02 DIAGNOSIS — Z8249 Family history of ischemic heart disease and other diseases of the circulatory system: Secondary | ICD-10-CM

## 2014-10-02 DIAGNOSIS — F17210 Nicotine dependence, cigarettes, uncomplicated: Secondary | ICD-10-CM | POA: Diagnosis present

## 2014-10-02 LAB — COMPREHENSIVE METABOLIC PANEL
ALBUMIN: 3.7 g/dL (ref 3.5–5.0)
ALT: 23 U/L (ref 14–54)
AST: 46 U/L — AB (ref 15–41)
Alkaline Phosphatase: 75 U/L (ref 38–126)
Anion gap: 9 (ref 5–15)
BILIRUBIN TOTAL: 1 mg/dL (ref 0.3–1.2)
BUN: 16 mg/dL (ref 6–20)
CHLORIDE: 99 mmol/L — AB (ref 101–111)
CO2: 28 mmol/L (ref 22–32)
CREATININE: 0.93 mg/dL (ref 0.44–1.00)
Calcium: 9.4 mg/dL (ref 8.9–10.3)
GFR calc Af Amer: >60 mL/min (ref >60)
GFR calc non Af Amer: >60 mL/min (ref >60)
Glucose, Bld: 89 mg/dL (ref 70–99)
Potassium: 4.7 mmol/L (ref 3.5–5.1)
Sodium: 136 mmol/L (ref 135–145)
Total Protein: 7.5 g/dL (ref 6.5–8.1)

## 2014-10-02 LAB — CBC WITH DIFFERENTIAL/PLATELET
BASOS ABS: 0 10*3/uL (ref 0.0–0.1)
Basophils Relative: 0 % (ref 0–1)
EOS PCT: 1 % (ref 0–5)
Eosinophils Absolute: 0.1 10*3/uL (ref 0.0–0.7)
HCT: 35.5 % — ABNORMAL LOW (ref 36.0–46.0)
Hemoglobin: 11.6 g/dL — ABNORMAL LOW (ref 12.0–15.0)
LYMPHS ABS: 1.7 10*3/uL (ref 0.7–4.0)
LYMPHS PCT: 23 % (ref 12–46)
MCH: 30.3 pg (ref 26.0–34.0)
MCHC: 32.7 g/dL (ref 30.0–36.0)
MCV: 92.7 fL (ref 78.0–100.0)
Monocytes Absolute: 0.6 10*3/uL (ref 0.1–1.0)
Monocytes Relative: 9 % (ref 3–12)
NEUTROS ABS: 4.8 10*3/uL (ref 1.7–7.7)
NEUTROS PCT: 67 % (ref 43–77)
PLATELETS: 120 10*3/uL — AB (ref 150–400)
RBC: 3.83 MIL/uL — AB (ref 3.87–5.11)
RDW: 13.4 % (ref 11.5–15.5)
WBC: 7.1 10*3/uL (ref 4.0–10.5)

## 2014-10-02 NOTE — ED Notes (Signed)
Pt. reports insect bite with itching and redness at bite mark at right forearm , also reported pain radiating to right upper arm , respirations unlabored / airway intact . No fever or chills. Seen at [facility] urgent care advised to go to ER for pain management and antibiotic .

## 2014-10-03 ENCOUNTER — Emergency Department (HOSPITAL_COMMUNITY)
Admission: EM | Admit: 2014-10-03 | Discharge: 2014-10-03 | Disposition: A | Discharge status: Home / Self Care | Payer: Medicare Other | Source: Home / Self Care | Attending: Emergency Medicine | Admitting: Emergency Medicine

## 2014-10-03 ENCOUNTER — Emergency Department (HOSPITAL_COMMUNITY)
Admission: EM | Admit: 2014-10-03 | Discharge: 2014-10-03 | Discharge status: Against Medical Advice | Payer: Medicare Other | Source: Home / Self Care

## 2014-10-03 ENCOUNTER — Encounter (HOSPITAL_COMMUNITY): Payer: Self-pay | Admitting: Emergency Medicine

## 2014-10-03 DIAGNOSIS — R112 Nausea with vomiting, unspecified: Secondary | ICD-10-CM | POA: Insufficient documentation

## 2014-10-03 DIAGNOSIS — I10 Essential (primary) hypertension: Secondary | ICD-10-CM | POA: Insufficient documentation

## 2014-10-03 DIAGNOSIS — Z79899 Other long term (current) drug therapy: Secondary | ICD-10-CM

## 2014-10-03 DIAGNOSIS — Z720 Tobacco use: Secondary | ICD-10-CM | POA: Insufficient documentation

## 2014-10-03 DIAGNOSIS — Z8719 Personal history of other diseases of the digestive system: Secondary | ICD-10-CM

## 2014-10-03 DIAGNOSIS — Y998 Other external cause status: Secondary | ICD-10-CM

## 2014-10-03 DIAGNOSIS — Z8701 Personal history of pneumonia (recurrent): Secondary | ICD-10-CM | POA: Insufficient documentation

## 2014-10-03 DIAGNOSIS — L03113 Cellulitis of right upper limb: Secondary | ICD-10-CM | POA: Insufficient documentation

## 2014-10-03 DIAGNOSIS — G894 Chronic pain syndrome: Secondary | ICD-10-CM

## 2014-10-03 DIAGNOSIS — Z8739 Personal history of other diseases of the musculoskeletal system and connective tissue: Secondary | ICD-10-CM

## 2014-10-03 DIAGNOSIS — Y9389 Activity, other specified: Secondary | ICD-10-CM

## 2014-10-03 DIAGNOSIS — F419 Anxiety disorder, unspecified: Secondary | ICD-10-CM | POA: Insufficient documentation

## 2014-10-03 DIAGNOSIS — W57XXXA Bitten or stung by nonvenomous insect and other nonvenomous arthropods, initial encounter: Secondary | ICD-10-CM

## 2014-10-03 DIAGNOSIS — Z862 Personal history of diseases of the blood and blood-forming organs and certain disorders involving the immune mechanism: Secondary | ICD-10-CM

## 2014-10-03 DIAGNOSIS — Z8711 Personal history of peptic ulcer disease: Secondary | ICD-10-CM | POA: Insufficient documentation

## 2014-10-03 DIAGNOSIS — Z8619 Personal history of other infectious and parasitic diseases: Secondary | ICD-10-CM

## 2014-10-03 DIAGNOSIS — R630 Anorexia: Secondary | ICD-10-CM

## 2014-10-03 DIAGNOSIS — Y9289 Other specified places as the place of occurrence of the external cause: Secondary | ICD-10-CM

## 2014-10-03 DIAGNOSIS — F319 Bipolar disorder, unspecified: Secondary | ICD-10-CM | POA: Insufficient documentation

## 2014-10-03 HISTORY — DX: Essential (primary) hypertension: I10

## 2014-10-03 MED ORDER — SULFAMETHOXAZOLE-TRIMETHOPRIM 800-160 MG PO TABS: 1.0000 | ORAL_TABLET | Freq: Two times a day (BID) | ORAL | Status: DC

## 2014-10-03 MED ORDER — CEPHALEXIN 500 MG PO CAPS
500.0000 mg | ORAL_CAPSULE | Freq: Once | ORAL | Status: AC
  Administered 2014-10-03: 500 mg via ORAL
  Filled 2014-10-03: qty 1

## 2014-10-03 MED ORDER — HYDROCODONE-ACETAMINOPHEN 5-325 MG PO TABS
2.0000 | ORAL_TABLET | Freq: Once | ORAL | Status: AC
  Administered 2014-10-03: 2 via ORAL
  Filled 2014-10-03: qty 2

## 2014-10-03 MED ORDER — HYDROCODONE-ACETAMINOPHEN 5-325 MG PO TABS: 1.0000 | ORAL_TABLET | ORAL | Status: DC | PRN

## 2014-10-03 MED ORDER — ONDANSETRON 4 MG PO TBDP
4.0000 mg | ORAL_TABLET | Freq: Once | ORAL | Status: AC
  Administered 2014-10-03: 4 mg via ORAL
  Filled 2014-10-03: qty 1

## 2014-10-03 MED ORDER — SULFAMETHOXAZOLE-TRIMETHOPRIM 800-160 MG PO TABS
1.0000 | ORAL_TABLET | Freq: Once | ORAL | Status: AC
  Administered 2014-10-03: 1 via ORAL
  Filled 2014-10-03: qty 1

## 2014-10-03 MED ORDER — CEPHALEXIN 500 MG PO CAPS: 500.0000 mg | ORAL_CAPSULE | Freq: Three times a day (TID) | ORAL | Status: DC

## 2014-10-03 NOTE — Discharge Instructions (Signed)
Watch for worsening swelling and firmness to the wound as it may need incision and drainage in 2-3 days if no improvement.  If you were given medicines take as directed.  If you are on coumadin or contraceptives realize their levels and effectiveness is altered by many different medicines.  If you have any reaction (rash, tongues swelling, other) to the medicines stop taking and see a physician.   Please follow up as directed and return to the ER or see a physician for new or worsening symptoms.  Thank you. Filed Vitals:   10/03/14 1549  BP: 136/71  Pulse: 89  Temp: 98.3 F (36.8 C)  TempSrc: Oral  Resp: 16  SpO2: 98%

## 2014-10-03 NOTE — ED Provider Notes (Signed)
CSN: [id number]     Arrival date & time 10/03/14  1531 History   First MD Initiated Contact with Patient 10/03/14 1548     Chief Complaint  Patient presents with  . Insect Bite    possible spider bite  . Nausea  . Emesis     (Consider location/radiation/quality/duration/timing/severity/associated sxs/prior Treatment) HPI Comments:  54 year old female with history of hepatitis C, ulcer disease, anemia, obesity, schizophrenia presents with worsening right forearm pain and redness. Patient felt she was bitten by spiders had worsening pain since yesterday. No current antibiotics, no fevers, mild nausea and vomiting. No history of MRSA known. Tender to palpation. Location right forearm.  Patient is a 54 y.o. female presenting with vomiting. The history is provided by the patient.  Emesis Associated symptoms: no chills and no headaches     Past Medical History  Diagnosis Date  . Chronic pain syndrome   . Cervical radiculopathy   . Osteoarthritis   . GERD (gastroesophageal reflux disease)   . Thrombocytopenia   . Leukocytopenia   . Peptic ulcer disease   . DJD (degenerative joint disease)   . Polysubstance (excluding opioids) dependence   . Anxiety   . Blood transfusion without reported diagnosis   . Osteoporosis   . Bipolar affective disorder     ADHD AND SCHZIOPHRENIC--PT GOES TO [place]. BLANKMAN  . Shortness of breath     WITH EXERTION; SMOKER  . Pneumonia 2013  . H/O hiatal hernia   . Seizures     SEIZURE AFTER BREATHING TX (ALBUTEROL) IN ER; ALSO PAST HX SEIZURES DURING DRUG DETOX  . Hepatitis C infection     STATES COMPLETED TREATMENT AND NO LONGER HAS INFECTION  . Complication of anesthesia     STATES SHE WOKE UP TWICE DURING SURGERIES  . Hypertension    Past Surgical History  Procedure Laterality Date  . Tonsilectomy, adenoidectomy, bilateral myringotomy and tubes    . Right knee arthroscopic surgery    . Total hip replacement    .  Appendectomy    . Hyperectomy      due to obstructive ovarian cytst  . Spine surgery      CERVIAL 6-7 SURGERY - FUSION  . Abdominal hysterectomy    . Joint replacement  2012    RIGHT TOTAL HIP REPLACEMENT  . Cholecystectomy    . Total hip arthroplasty Left 08/24/2012    Procedure: LEFT TOTAL HIP ARTHROPLASTY ANTERIOR APPROACH;  Surgeon: [name], MD;  Location: [facility];  Service: Orthopedics;  Laterality: Left;   Family History  Problem Relation Age of Onset  . Breast cancer Sister   . Cancer Sister   . Heart disease Mother   . Heart disease Father    History  Substance Use Topics  . Smoking status: Current Some Day Smoker -- 0.50 packs/day for 20 years    Types: Cigarettes  . Smokeless tobacco: Never Used  . Alcohol Use: No     Comment: HX OF ABUSE OF PAIN MEDS--PT IS CURRENTLY IN TREATMENT AT [facility] IN    OB History    No data available     Review of Systems  Constitutional: Positive for appetite change. Negative for fever and chills.  Respiratory: Negative for shortness of breath.   Gastrointestinal: Positive for nausea and vomiting.  Skin: Positive for rash (pruritic).  Neurological: Negative for headaches.      Allergies  Albuterol; Aspirin; and Adhesive  Home Medications  Prior to Admission medications   Medication Sig Start Date End Date Taking? Authorizing Provider  cyclobenzaprine (FLEXERIL) 10 MG tablet Take 10 mg by mouth daily. 09/29/14   Historical Provider, MD  docusate sodium 100 MG CAPS Take 100 mg by mouth 2 (two) times daily. 08/26/12   Danae Orleans, PA-C  esomeprazole (NEXIUM) 40 MG capsule Take 40 mg by mouth daily at 12 noon.    Historical Provider, MD  ferrous sulfate 325 (65 FE) MG tablet Take 1 tablet (325 mg total) by mouth 3 (three) times daily after meals. 08/26/12   Danae Orleans, PA-C  lisinopril (PRINIVIL,ZESTRIL) 5 MG tablet Take 1 tablet (5 mg total) by mouth daily. Patient not taking: Reported on  10/02/2014 08/14/14   Liam Graham, PA-C  lisinopril-hydrochlorothiazide (PRINZIDE,ZESTORETIC) 10-12.5 MG per tablet Take 1 tablet by mouth daily. 09/29/14   Historical Provider, MD  oxyCODONE 10 MG TABS Take 1-2 tablets (10-20 mg total) by mouth every 4 (four) hours. Patient not taking: Reported on 10/02/2014 08/26/12   Danae Orleans, PA-C  polyethylene glycol (MIRALAX / Floria Raveling) packet Take 17 g by mouth 2 (two) times daily. Patient not taking: Reported on 10/02/2014 08/26/12   Danae Orleans, PA-C  potassium chloride SA (K-DUR,KLOR-CON) 20 MEQ tablet Take 20 mEq by mouth daily. 09/29/14   Historical Provider, MD  QUEtiapine (SEROQUEL) 300 MG tablet Take 600 mg by mouth at bedtime.     Historical Provider, MD  QUEtiapine (SEROQUEL) 300 MG tablet Take 1 tablet (300 mg total) by mouth at bedtime. 08/14/14   Freeman Caldron Baker, PA-C  tiZANidine (ZANAFLEX) 4 MG capsule Take 1 capsule (4 mg total) by mouth 3 (three) times daily. Muscle spasms Patient not taking: Reported on 10/02/2014 08/26/12   Danae Orleans, PA-C  traMADol (ULTRAM) 50 MG tablet Take 50-100 mg by mouth 3 (three) times daily as needed for moderate pain.  09/29/14   Historical Provider, MD   BP 136/71 mmHg  Pulse 89  Temp(Src) 98.3 F (36.8 C) (Oral)  Resp 16  SpO2 98% Physical Exam  Constitutional: She appears well-developed and well-nourished. No distress.  HENT:  Head: Normocephalic and atraumatic.  Neck: Normal range of motion. Neck supple.  Cardiovascular: Normal rate and regular rhythm.   Pulmonary/Chest: Effort normal.  Musculoskeletal: Normal range of motion.  Neurological: She is alert.  Skin: Skin is warm. Rash noted. There is erythema.  Patient has approximately 5 cm diameter area of erythema warmth and tenderness with central area of mild induration, no crepitus, no streaking erythema up the arm. Neurovascular intact the right arm no involvement of the joint  Nursing note and vitals reviewed.   ED Course  Procedures (including  critical care time) EMERGENCY DEPARTMENT US SOFT TISSUE INTERPRETATION "Study: Limited Soft Tissue Ultrasound"  INDICATIONS: Pain and Soft tissue infection Multiple views of the body part were obtained in real-time with a multi-frequency linear probe PERFORMED BY:  Myself IMAGES ARCHIVED?: Yes SIDE:Right  BODY PART:Upper extremity FINDINGS: No abcess noted and Cellulitis present INTERPRETATION:  No abcess noted and Cellulitis present   CPT: Neck 47654-65  Upper extremity 03546-56  Axilla 81275-17  Chest wall 00174-94  Beast 49675-91  Upper back 63846-65  Lower back 99357-01  Abdominal wall 77939-03  Pelvic wall 00923-30  Lower extremity 07622-63  Other soft tissue 33545-62   Labs Review Labs Reviewed - No data to display  Imaging Review No results found.   EKG Interpretation None      MDM   Final diagnoses:  Right arm cellulitis   Patient presents with clinically cellulitis and possibly early abscess. Bedside ultrasound no focal fluid collection. Discussed recheck in 3 days antibiotics and pain meds.  Results and differential diagnosis were discussed with the patient/parent/guardian. Close follow up outpatient was discussed, comfortable with the plan.   Medications  sulfamethoxazole-trimethoprim (BACTRIM DS,SEPTRA DS) 800-160 MG per tablet 1 tablet (not administered)  ondansetron (ZOFRAN-ODT) disintegrating tablet 4 mg (not administered)  HYDROcodone-acetaminophen (NORCO/VICODIN) 5-325 MG per tablet 2 tablet (not administered)  cephALEXin (KEFLEX) capsule 500 mg (not administered)    Filed Vitals:   10/03/14 1549  BP: 136/71  Pulse: 89  Temp: 98.3 F (36.8 C)  TempSrc: Oral  Resp: 16  SpO2: 98%    Final diagnoses:  Right arm cellulitis      Elnora Morrison, MD 10/03/14 [phone]

## 2014-10-03 NOTE — ED Notes (Signed)
Pt sts that she is leaving. sts that she will be back in the morning.

## 2014-10-03 NOTE — ED Notes (Signed)
L/forearm red and swollen,Pt stated that she has been evaluated by Urgent Care for possible "black widow spider bite". [facility], left prior to complete evaluation. Swollen area raised, 9 x 8 cm. Pt reports increased pain and swelling today

## 2014-10-04 ENCOUNTER — Encounter (HOSPITAL_COMMUNITY): Payer: Self-pay | Admitting: Emergency Medicine

## 2014-10-04 ENCOUNTER — Emergency Department (HOSPITAL_COMMUNITY)
Admission: EM | Admit: 2014-10-04 | Discharge: 2014-10-04 | Disposition: A | Discharge status: Home / Self Care | Payer: Medicare Other | Source: Home / Self Care | Attending: Emergency Medicine | Admitting: Emergency Medicine

## 2014-10-04 DIAGNOSIS — Z8701 Personal history of pneumonia (recurrent): Secondary | ICD-10-CM

## 2014-10-04 DIAGNOSIS — F419 Anxiety disorder, unspecified: Secondary | ICD-10-CM | POA: Insufficient documentation

## 2014-10-04 DIAGNOSIS — Z79899 Other long term (current) drug therapy: Secondary | ICD-10-CM

## 2014-10-04 DIAGNOSIS — F319 Bipolar disorder, unspecified: Secondary | ICD-10-CM | POA: Insufficient documentation

## 2014-10-04 DIAGNOSIS — Z720 Tobacco use: Secondary | ICD-10-CM

## 2014-10-04 DIAGNOSIS — M1990 Unspecified osteoarthritis, unspecified site: Secondary | ICD-10-CM | POA: Insufficient documentation

## 2014-10-04 DIAGNOSIS — Z8619 Personal history of other infectious and parasitic diseases: Secondary | ICD-10-CM | POA: Insufficient documentation

## 2014-10-04 DIAGNOSIS — G894 Chronic pain syndrome: Secondary | ICD-10-CM

## 2014-10-04 DIAGNOSIS — L03113 Cellulitis of right upper limb: Secondary | ICD-10-CM

## 2014-10-04 DIAGNOSIS — K219 Gastro-esophageal reflux disease without esophagitis: Secondary | ICD-10-CM | POA: Insufficient documentation

## 2014-10-04 DIAGNOSIS — Z8711 Personal history of peptic ulcer disease: Secondary | ICD-10-CM

## 2014-10-04 DIAGNOSIS — I10 Essential (primary) hypertension: Secondary | ICD-10-CM

## 2014-10-04 DIAGNOSIS — Z862 Personal history of diseases of the blood and blood-forming organs and certain disorders involving the immune mechanism: Secondary | ICD-10-CM | POA: Insufficient documentation

## 2014-10-04 LAB — BASIC METABOLIC PANEL
Anion gap: 11 (ref 5–15)
BUN: 16 mg/dL (ref 6–20)
CHLORIDE: 98 mmol/L — AB (ref 101–111)
CO2: 26 mmol/L (ref 22–32)
CREATININE: 0.93 mg/dL (ref 0.44–1.00)
Calcium: 8.9 mg/dL (ref 8.9–10.3)
GFR calc non Af Amer: >60 mL/min (ref >60)
GLUCOSE: 97 mg/dL (ref 70–99)
Potassium: 3.6 mmol/L (ref 3.5–5.1)
Sodium: 135 mmol/L (ref 135–145)

## 2014-10-04 LAB — CBC WITH DIFFERENTIAL/PLATELET
Basophils Absolute: 0 10*3/uL (ref 0.0–0.1)
Basophils Relative: 0 % (ref 0–1)
EOS ABS: 0.1 10*3/uL (ref 0.0–0.7)
Eosinophils Relative: 1 % (ref 0–5)
HEMATOCRIT: 34.9 % — AB (ref 36.0–46.0)
HEMOGLOBIN: 11.4 g/dL — AB (ref 12.0–15.0)
LYMPHS ABS: 1.9 10*3/uL (ref 0.7–4.0)
Lymphocytes Relative: 19 % (ref 12–46)
MCH: 30.2 pg (ref 26.0–34.0)
MCHC: 32.7 g/dL (ref 30.0–36.0)
MCV: 92.6 fL (ref 78.0–100.0)
MONO ABS: 1.2 10*3/uL — AB (ref 0.1–1.0)
MONOS PCT: 11 % (ref 3–12)
NEUTROS PCT: 69 % (ref 43–77)
Neutro Abs: 7 10*3/uL (ref 1.7–7.7)
Platelets: 90 10*3/uL — ABNORMAL LOW (ref 150–400)
RBC: 3.77 MIL/uL — ABNORMAL LOW (ref 3.87–5.11)
RDW: 13.2 % (ref 11.5–15.5)
WBC: 10.2 10*3/uL (ref 4.0–10.5)

## 2014-10-04 MED ORDER — HYDROMORPHONE HCL 1 MG/ML IJ SOLN
1.0000 mg | Freq: Once | INTRAMUSCULAR | Status: AC
  Administered 2014-10-04: 1 mg via INTRAVENOUS
  Filled 2014-10-04: qty 1

## 2014-10-04 MED ORDER — HYDROCODONE-ACETAMINOPHEN 5-325 MG PO TABS: 1.0000 | ORAL_TABLET | Freq: Four times a day (QID) | ORAL | Status: DC | PRN

## 2014-10-04 MED ORDER — CLINDAMYCIN PHOSPHATE 600 MG/50ML IV SOLN
600.0000 mg | Freq: Once | INTRAVENOUS | Status: AC
  Administered 2014-10-04: 600 mg via INTRAVENOUS
  Filled 2014-10-04: qty 50

## 2014-10-04 MED ORDER — CLINDAMYCIN HCL 300 MG PO CAPS: 300.0000 mg | ORAL_CAPSULE | Freq: Four times a day (QID) | ORAL | Status: DC

## 2014-10-04 NOTE — ED Provider Notes (Signed)
CSN: [id number]     Arrival date & time 10/04/14  0932 History   First MD Initiated Contact with Patient 10/04/14 0935     Chief Complaint  Patient presents with  . Cellulitis     (Consider location/radiation/quality/duration/timing/severity/associated sxs/prior Treatment) Patient is a 54 y.o. female presenting with arm injury. The history is provided by the patient.  Arm Injury Location:  Arm Time since incident:  3 days Injury: no   Arm location:  R arm Pain details:    Quality:  Aching   Radiates to:  Does not radiate   Severity:  Moderate   Onset quality:  Gradual   Timing:  Constant   Progression:  Worsening Chronicity:  New Dislocation: no   Foreign body present:  No foreign bodies Prior injury to area:  No Relieved by: vicoden. Worsened by:  Nothing tried Ineffective treatments:  None tried Associated symptoms: no back pain and no neck pain     Past Medical History  Diagnosis Date  . Chronic pain syndrome   . Cervical radiculopathy   . Osteoarthritis   . GERD (gastroesophageal reflux disease)   . Thrombocytopenia   . Leukocytopenia   . Peptic ulcer disease   . DJD (degenerative joint disease)   . Polysubstance (excluding opioids) dependence   . Anxiety   . Blood transfusion without reported diagnosis   . Osteoporosis   . Bipolar affective disorder     ADHD AND SCHZIOPHRENIC--PT GOES TO [place]. BLANKMAN  . Shortness of breath     WITH EXERTION; SMOKER  . Pneumonia 2013  . H/O hiatal hernia   . Seizures     SEIZURE AFTER BREATHING TX (ALBUTEROL) IN ER; ALSO PAST HX SEIZURES DURING DRUG DETOX  . Hepatitis C infection     STATES COMPLETED TREATMENT AND NO LONGER HAS INFECTION  . Complication of anesthesia     STATES SHE WOKE UP TWICE DURING SURGERIES  . Hypertension    Past Surgical History  Procedure Laterality Date  . Tonsilectomy, adenoidectomy, bilateral myringotomy and tubes    . Right knee arthroscopic surgery    . Total  hip replacement    . Appendectomy    . Hyperectomy      due to obstructive ovarian cytst  . Spine surgery      CERVIAL 6-7 SURGERY - FUSION  . Abdominal hysterectomy    . Joint replacement  2012    RIGHT TOTAL HIP REPLACEMENT  . Cholecystectomy    . Total hip arthroplasty Left 08/24/2012    Procedure: LEFT TOTAL HIP ARTHROPLASTY ANTERIOR APPROACH;  Surgeon: [name], MD;  Location: [facility];  Service: Orthopedics;  Laterality: Left;   Family History  Problem Relation Age of Onset  . Breast cancer Sister   . Cancer Sister   . Heart disease Mother   . Heart disease Father    History  Substance Use Topics  . Smoking status: Current Some Day Smoker -- 0.50 packs/day for 20 years    Types: Cigarettes  . Smokeless tobacco: Never Used  . Alcohol Use: No     Comment: HX OF ABUSE OF PAIN MEDS--PT IS CURRENTLY IN TREATMENT AT [facility] IN [place]   OB History    No data available     Review of Systems  HENT: Negative for drooling.   Eyes: Negative for pain.  Gastrointestinal: Negative for abdominal pain and diarrhea.  Genitourinary: Negative for dysuria and hematuria.  Musculoskeletal: Negative for  back pain, gait problem and neck pain.  Skin: Negative for color change.  Neurological: Negative for dizziness.  Hematological: Negative for adenopathy.  Psychiatric/Behavioral: Negative for behavioral problems.  All other systems reviewed and are negative.     Allergies  Albuterol; Aspirin; and Adhesive  Home Medications   Prior to Admission medications   Medication Sig Start Date End Date Taking? Authorizing Provider  cephALEXin (KEFLEX) 500 MG capsule Take 1 capsule (500 mg total) by mouth 3 (three) times daily. 10/03/14   Elnora Morrison, MD  cyclobenzaprine (FLEXERIL) 10 MG tablet Take 10 mg by mouth daily. 09/29/14   Historical Provider, MD  docusate sodium 100 MG CAPS Take 100 mg by mouth 2 (two) times daily. 08/26/12   Danae Orleans, PA-C    esomeprazole (NEXIUM) 40 MG capsule Take 40 mg by mouth daily at 12 noon.    Historical Provider, MD  ferrous sulfate 325 (65 FE) MG tablet Take 1 tablet (325 mg total) by mouth 3 (three) times daily after meals. 08/26/12   Danae Orleans, PA-C  HYDROcodone-acetaminophen (NORCO) 5-325 MG per tablet Take 1 tablet by mouth every 4 (four) hours as needed. 10/03/14   Elnora Morrison, MD  lisinopril (PRINIVIL,ZESTRIL) 5 MG tablet Take 1 tablet (5 mg total) by mouth daily. Patient not taking: Reported on 10/02/2014 08/14/14   Liam Graham, PA-C  lisinopril-hydrochlorothiazide (PRINZIDE,ZESTORETIC) 10-12.5 MG per tablet Take 1 tablet by mouth daily. 09/29/14   Historical Provider, MD  oxyCODONE 10 MG TABS Take 1-2 tablets (10-20 mg total) by mouth every 4 (four) hours. Patient not taking: Reported on 10/02/2014 08/26/12   Danae Orleans, PA-C  polyethylene glycol (MIRALAX / Floria Raveling) packet Take 17 g by mouth 2 (two) times daily. Patient not taking: Reported on 10/02/2014 08/26/12   Danae Orleans, PA-C  potassium chloride SA (K-DUR,KLOR-CON) 20 MEQ tablet Take 20 mEq by mouth daily. 09/29/14   Historical Provider, MD  QUEtiapine (SEROQUEL) 300 MG tablet Take 600 mg by mouth at bedtime.     Historical Provider, MD  QUEtiapine (SEROQUEL) 300 MG tablet Take 1 tablet (300 mg total) by mouth at bedtime. 08/14/14   Liam Graham, PA-C  sulfamethoxazole-trimethoprim (BACTRIM DS,SEPTRA DS) 800-160 MG per tablet Take 1 tablet by mouth 2 (two) times daily. 10/03/14 10/10/14  Elnora Morrison, MD  tiZANidine (ZANAFLEX) 4 MG capsule Take 1 capsule (4 mg total) by mouth 3 (three) times daily. Muscle spasms Patient not taking: Reported on 10/02/2014 08/26/12   Danae Orleans, PA-C  traMADol (ULTRAM) 50 MG tablet Take 50-100 mg by mouth 3 (three) times daily as needed for moderate pain.  09/29/14   Historical Provider, MD   BP 143/73 mmHg  Pulse 94  Temp(Src) 98.2 F (36.8 C) (Oral)  Resp 18  SpO2 100% Physical Exam  Constitutional: She  is oriented to person, place, and time. She appears well-developed and well-nourished.  HENT:  Head: Normocephalic.  Mouth/Throat: Oropharynx is clear and moist. No oropharyngeal exudate.  Eyes: Conjunctivae and EOM are normal. Pupils are equal, round, and reactive to light.  Neck: Normal range of motion. Neck supple.  Cardiovascular: Normal rate, regular rhythm, normal heart sounds and intact distal pulses.  Exam reveals no gallop and no friction rub.   No murmur heard. Pulmonary/Chest: Effort normal and breath sounds normal. No respiratory distress. She has no wheezes.  Abdominal: Soft. Bowel sounds are normal. There is no tenderness. There is no rebound and no guarding.  Musculoskeletal: Normal range of motion. She exhibits tenderness.  She exhibits no edema.  2+ distal proximal pulses in the right upper extremity.  Normal motor skills of the right hand and right upper extremity.  Area of 10 x 15 cm erythema just distal to the right antecubital space. This area is mildly indurated and tender to palpation. There is a small central excoriation without purulent drainage. There is no fluctuance.  Neurological: She is alert and oriented to person, place, and time.  Skin: Skin is warm and dry.  Psychiatric: She has a normal mood and affect. Her behavior is normal.  Nursing note and vitals reviewed.   ED Course  Procedures (including critical care time) Labs Review Labs Reviewed  CBC WITH DIFFERENTIAL/PLATELET - Abnormal; Notable for the following:    RBC 3.77 (*)    Hemoglobin 11.4 (*)    HCT 34.9 (*)    Platelets 90 (*)    Monocytes Absolute 1.2 (*)    All other components within normal limits  BASIC METABOLIC PANEL - Abnormal; Notable for the following:    Chloride 98 (*)    All other components within normal limits    Imaging Review No results found.   EKG Interpretation None           MDM   Final diagnoses:  Cellulitis of right upper extremity    10:17 AM 54  y.o. female w hx of chronic pain syndrome, hep C, HTN who presents with cellulitis to the right proximal forearm which began 2 days ago. She believes that she got an insect bite, possibly a spider. She was seen yesterday by Dr. Reather Converse who did a bedside ultrasound showing no evidence of fluid collection. I also suspect this is just cellulitis. She notes subjective fevers at home but vital signs are unremarkable here. She appears well on exam. She started Keflex and Bactrim yesterday. While the erythema has spread slightly outside the previously documented margins yesterday, she has also been on abx less than 24 hrs. I offered IV antibiotics and admission but she would prefer to go home. We'll give her a dose of clindamycin here, get screening lab work, and pain medicine.  She notes a history of IV drug use in the remote past but denies this recently. She notes tetanus UTD.   12:56 PM: I again recommended admission, but pt refused. It is not unreasonable to give her more time on outpt abx given her well appearance, normal VS here, afebrile. Will switch to clindamycin.  I have discussed the diagnosis/risks/treatment options with the patient and believe the pt to be eligible for discharge home to follow-up here in 24-48 hrs for recheck. We also discussed returning to the ED immediately if new or worsening sx occur. We discussed the sx which are most concerning (e.g., spreading redness, worsening pain, fever, vomiting, myalgias) that necessitate immediate return. Medications administered to the patient during their visit and any new prescriptions provided to the patient are listed below.  Medications given during this visit Medications  clindamycin (CLEOCIN) IVPB 600 mg (0 mg Intravenous Stopped 10/04/14 1130)  HYDROmorphone (DILAUDID) injection 1 mg (1 mg Intravenous Given 10/04/14 1052)    New Prescriptions   CLINDAMYCIN (CLEOCIN) 300 MG CAPSULE    Take 1 capsule (300 mg total) by mouth 4 (four) times daily.  X 7 days   HYDROCODONE-ACETAMINOPHEN (NORCO/VICODIN) 5-325 MG PER TABLET    Take 1-2 tablets by mouth every 6 (six) hours as needed.     Pamella Pert, MD 10/05/14 [phone]

## 2014-10-04 NOTE — ED Notes (Signed)
Per pt, states spider bite 2 days ago-right forearm red, swollen and painful-not getting better

## 2014-10-05 ENCOUNTER — Inpatient Hospital Stay (HOSPITAL_COMMUNITY)
Admission: EM | Admit: 2014-10-05 | Discharge: 2014-10-07 | DRG: 603 | Disposition: A | Discharge status: Home / Self Care | Payer: Medicare Other | Attending: Internal Medicine | Admitting: Internal Medicine

## 2014-10-05 ENCOUNTER — Encounter (HOSPITAL_COMMUNITY): Payer: Self-pay

## 2014-10-05 DIAGNOSIS — Z8711 Personal history of peptic ulcer disease: Secondary | ICD-10-CM | POA: Diagnosis not present

## 2014-10-05 DIAGNOSIS — I10 Essential (primary) hypertension: Secondary | ICD-10-CM | POA: Diagnosis present

## 2014-10-05 DIAGNOSIS — G894 Chronic pain syndrome: Secondary | ICD-10-CM | POA: Diagnosis present

## 2014-10-05 DIAGNOSIS — M810 Age-related osteoporosis without current pathological fracture: Secondary | ICD-10-CM | POA: Diagnosis present

## 2014-10-05 DIAGNOSIS — F17210 Nicotine dependence, cigarettes, uncomplicated: Secondary | ICD-10-CM | POA: Diagnosis present

## 2014-10-05 DIAGNOSIS — N179 Acute kidney failure, unspecified: Secondary | ICD-10-CM | POA: Diagnosis present

## 2014-10-05 DIAGNOSIS — K7460 Unspecified cirrhosis of liver: Secondary | ICD-10-CM | POA: Diagnosis present

## 2014-10-05 DIAGNOSIS — D6959 Other secondary thrombocytopenia: Secondary | ICD-10-CM | POA: Diagnosis present

## 2014-10-05 DIAGNOSIS — B1920 Unspecified viral hepatitis C without hepatic coma: Secondary | ICD-10-CM | POA: Diagnosis present

## 2014-10-05 DIAGNOSIS — Z8659 Personal history of other mental and behavioral disorders: Secondary | ICD-10-CM

## 2014-10-05 DIAGNOSIS — Z96641 Presence of right artificial hip joint: Secondary | ICD-10-CM | POA: Diagnosis present

## 2014-10-05 DIAGNOSIS — F419 Anxiety disorder, unspecified: Secondary | ICD-10-CM | POA: Diagnosis present

## 2014-10-05 DIAGNOSIS — Z8719 Personal history of other diseases of the digestive system: Secondary | ICD-10-CM

## 2014-10-05 DIAGNOSIS — E875 Hyperkalemia: Secondary | ICD-10-CM | POA: Diagnosis present

## 2014-10-05 DIAGNOSIS — F909 Attention-deficit hyperactivity disorder, unspecified type: Secondary | ICD-10-CM | POA: Diagnosis present

## 2014-10-05 DIAGNOSIS — L02413 Cutaneous abscess of right upper limb: Secondary | ICD-10-CM | POA: Diagnosis present

## 2014-10-05 DIAGNOSIS — D696 Thrombocytopenia, unspecified: Secondary | ICD-10-CM | POA: Diagnosis present

## 2014-10-05 DIAGNOSIS — D638 Anemia in other chronic diseases classified elsewhere: Secondary | ICD-10-CM | POA: Diagnosis present

## 2014-10-05 DIAGNOSIS — M1990 Unspecified osteoarthritis, unspecified site: Secondary | ICD-10-CM | POA: Diagnosis present

## 2014-10-05 DIAGNOSIS — Z8249 Family history of ischemic heart disease and other diseases of the circulatory system: Secondary | ICD-10-CM | POA: Diagnosis not present

## 2014-10-05 DIAGNOSIS — L0390 Cellulitis, unspecified: Secondary | ICD-10-CM

## 2014-10-05 DIAGNOSIS — Z79891 Long term (current) use of opiate analgesic: Secondary | ICD-10-CM | POA: Diagnosis not present

## 2014-10-05 DIAGNOSIS — Z886 Allergy status to analgesic agent status: Secondary | ICD-10-CM | POA: Diagnosis not present

## 2014-10-05 DIAGNOSIS — L03113 Cellulitis of right upper limb: Secondary | ICD-10-CM | POA: Diagnosis present

## 2014-10-05 DIAGNOSIS — K219 Gastro-esophageal reflux disease without esophagitis: Secondary | ICD-10-CM | POA: Diagnosis present

## 2014-10-05 DIAGNOSIS — Z803 Family history of malignant neoplasm of breast: Secondary | ICD-10-CM | POA: Diagnosis not present

## 2014-10-05 DIAGNOSIS — M7989 Other specified soft tissue disorders: Secondary | ICD-10-CM | POA: Diagnosis present

## 2014-10-05 DIAGNOSIS — Z79899 Other long term (current) drug therapy: Secondary | ICD-10-CM | POA: Diagnosis not present

## 2014-10-05 DIAGNOSIS — F319 Bipolar disorder, unspecified: Secondary | ICD-10-CM | POA: Diagnosis present

## 2014-10-05 DIAGNOSIS — Z888 Allergy status to other drugs, medicaments and biological substances status: Secondary | ICD-10-CM | POA: Diagnosis not present

## 2014-10-05 DIAGNOSIS — Z9109 Other allergy status, other than to drugs and biological substances: Secondary | ICD-10-CM | POA: Diagnosis not present

## 2014-10-05 LAB — BASIC METABOLIC PANEL
ANION GAP: 5 (ref 5–15)
BUN: 21 mg/dL — ABNORMAL HIGH (ref 6–20)
CO2: 24 mmol/L (ref 22–32)
Calcium: 8.6 mg/dL — ABNORMAL LOW (ref 8.9–10.3)
Chloride: 102 mmol/L (ref 101–111)
Creatinine, Ser: 1.05 mg/dL — ABNORMAL HIGH (ref 0.44–1.00)
GFR calc Af Amer: >60 mL/min (ref >60)
GFR calc non Af Amer: 60 mL/min — ABNORMAL LOW (ref >60)
Glucose, Bld: 119 mg/dL — ABNORMAL HIGH (ref 65–99)
Potassium: 4.3 mmol/L (ref 3.5–5.1)
SODIUM: 131 mmol/L — AB (ref 135–145)

## 2014-10-05 LAB — CBC WITH DIFFERENTIAL/PLATELET
Basophils Absolute: 0 10*3/uL (ref 0.0–0.1)
Basophils Relative: 0 % (ref 0–1)
EOS ABS: 0.1 10*3/uL (ref 0.0–0.7)
EOS PCT: 1 % (ref 0–5)
HEMATOCRIT: 33.8 % — AB (ref 36.0–46.0)
Hemoglobin: 11.3 g/dL — ABNORMAL LOW (ref 12.0–15.0)
Lymphocytes Relative: 21 % (ref 12–46)
Lymphs Abs: 1.5 10*3/uL (ref 0.7–4.0)
MCH: 31 pg (ref 26.0–34.0)
MCHC: 33.4 g/dL (ref 30.0–36.0)
MCV: 92.6 fL (ref 78.0–100.0)
Monocytes Absolute: 0.8 10*3/uL (ref 0.1–1.0)
Monocytes Relative: 12 % (ref 3–12)
Neutro Abs: 4.6 10*3/uL (ref 1.7–7.7)
Neutrophils Relative %: 66 % (ref 43–77)
Platelets: 108 10*3/uL — ABNORMAL LOW (ref 150–400)
RBC: 3.65 MIL/uL — ABNORMAL LOW (ref 3.87–5.11)
RDW: 13.2 % (ref 11.5–15.5)
WBC: 7 10*3/uL (ref 4.0–10.5)

## 2014-10-05 MED ORDER — DEXTROSE 5 % IV SOLN
2.0000 g | Freq: Two times a day (BID) | INTRAVENOUS | Status: DC
  Administered 2014-10-05 – 2014-10-07 (×4): 2 g via INTRAVENOUS
  Filled 2014-10-05 (×6): qty 2

## 2014-10-05 MED ORDER — HYDROMORPHONE HCL 1 MG/ML IJ SOLN
1.0000 mg | Freq: Once | INTRAMUSCULAR | Status: AC
  Administered 2014-10-05: 1 mg via INTRAVENOUS
  Filled 2014-10-05: qty 1

## 2014-10-05 MED ORDER — POTASSIUM CHLORIDE CRYS ER 20 MEQ PO TBCR
20.0000 meq | EXTENDED_RELEASE_TABLET | Freq: Every day | ORAL | Status: DC
  Administered 2014-10-05 – 2014-10-06 (×2): 20 meq via ORAL
  Filled 2014-10-05 (×3): qty 1

## 2014-10-05 MED ORDER — ACETAMINOPHEN 650 MG RE SUPP: 650.0000 mg | Freq: Four times a day (QID) | RECTAL | Status: DC | PRN

## 2014-10-05 MED ORDER — VANCOMYCIN HCL IN DEXTROSE 1-5 GM/200ML-% IV SOLN
1000.0000 mg | Freq: Two times a day (BID) | INTRAVENOUS | Status: DC
  Filled 2014-10-05: qty 200

## 2014-10-05 MED ORDER — ACETAMINOPHEN 325 MG PO TABS: 650.0000 mg | ORAL_TABLET | Freq: Four times a day (QID) | ORAL | Status: DC | PRN

## 2014-10-05 MED ORDER — TRAMADOL HCL 50 MG PO TABS
50.0000 mg | ORAL_TABLET | Freq: Three times a day (TID) | ORAL | Status: DC | PRN
  Administered 2014-10-06: 100 mg via ORAL
  Filled 2014-10-05: qty 2
  Filled 2014-10-05: qty 1

## 2014-10-05 MED ORDER — LIDOCAINE HCL (PF) 1 % IJ SOLN
5.0000 mL | Freq: Once | INTRAMUSCULAR | Status: AC
  Administered 2014-10-05: 5 mL
  Filled 2014-10-05: qty 5

## 2014-10-05 MED ORDER — VANCOMYCIN HCL IN DEXTROSE 1-5 GM/200ML-% IV SOLN
1000.0000 mg | Freq: Two times a day (BID) | INTRAVENOUS | Status: DC
  Administered 2014-10-06 – 2014-10-07 (×3): 1000 mg via INTRAVENOUS
  Filled 2014-10-05 (×4): qty 200

## 2014-10-05 MED ORDER — SODIUM CHLORIDE 0.9 % IV SOLN
INTRAVENOUS | Status: AC
  Administered 2014-10-05: 1000 mL via INTRAVENOUS

## 2014-10-05 MED ORDER — VANCOMYCIN HCL IN DEXTROSE 1-5 GM/200ML-% IV SOLN
1000.0000 mg | Freq: Once | INTRAVENOUS | Status: AC
  Administered 2014-10-05: 1000 mg via INTRAVENOUS
  Filled 2014-10-05: qty 200

## 2014-10-05 MED ORDER — ONDANSETRON HCL 4 MG/2ML IJ SOLN
4.0000 mg | Freq: Once | INTRAMUSCULAR | Status: AC
  Administered 2014-10-05: 4 mg via INTRAVENOUS
  Filled 2014-10-05: qty 2

## 2014-10-05 MED ORDER — ONDANSETRON HCL 4 MG PO TABS: 4.0000 mg | ORAL_TABLET | Freq: Four times a day (QID) | ORAL | Status: DC | PRN

## 2014-10-05 MED ORDER — HYDRALAZINE HCL 20 MG/ML IJ SOLN: 5.0000 mg | INTRAMUSCULAR | Status: DC | PRN

## 2014-10-05 MED ORDER — QUETIAPINE FUMARATE 300 MG PO TABS
600.0000 mg | ORAL_TABLET | Freq: Every day | ORAL | Status: DC
  Administered 2014-10-07: 300 mg via ORAL
  Filled 2014-10-05 (×4): qty 2

## 2014-10-05 MED ORDER — PANTOPRAZOLE SODIUM 40 MG PO TBEC
40.0000 mg | DELAYED_RELEASE_TABLET | Freq: Every day | ORAL | Status: DC
  Administered 2014-10-05 – 2014-10-07 (×3): 40 mg via ORAL
  Filled 2014-10-05 (×3): qty 1

## 2014-10-05 MED ORDER — ACETAMINOPHEN-CODEINE #3 300-30 MG PO TABS
1.0000 | ORAL_TABLET | ORAL | Status: DC | PRN
  Administered 2014-10-06: 2 via ORAL
  Filled 2014-10-05: qty 2

## 2014-10-05 MED ORDER — HYDROMORPHONE HCL 1 MG/ML IJ SOLN
0.5000 mg | INTRAMUSCULAR | Status: DC | PRN
  Administered 2014-10-05 – 2014-10-06 (×2): 0.5 mg via INTRAVENOUS
  Filled 2014-10-05 (×2): qty 1

## 2014-10-05 MED ORDER — ONDANSETRON HCL 4 MG/2ML IJ SOLN: 4.0000 mg | Freq: Four times a day (QID) | INTRAMUSCULAR | Status: DC | PRN

## 2014-10-05 MED ORDER — AMPHETAMINE-DEXTROAMPHETAMINE 10 MG PO TABS
20.0000 mg | ORAL_TABLET | Freq: Three times a day (TID) | ORAL | Status: DC
  Administered 2014-10-05 – 2014-10-07 (×6): 20 mg via ORAL
  Filled 2014-10-05 (×6): qty 2

## 2014-10-05 NOTE — ED Notes (Signed)
Attempted to call report.  No answer on floor.

## 2014-10-05 NOTE — H&P (Signed)
[facility] History and Physical  [name] SNK:[id number] DOB: 1961/03/01 DOA: 10/05/2014  Referring physician: Dr. Christy Gentles. PCP: [place]  patient presently has no PCP. Patient is moving from [place].  Specialist - none.  Chief Complaint: Right forearm swelling and pain.  HPI: [name] is a 54 y.o. female with history of cirrhosis of liver, bipolar disorder, chronic thrombocytopenia, hepatitis C and hypertension presents to the ER because of worsening swelling and pain of the right forearm. Patient had come to the ER 2 days ago and was prescribed antibiotic despite taking which patient has having called with severe swelling and pain in the forearm. He has vision on exam founds abscesses on the forearm with erythema and swelling extending up to the distal arm and the forearm. Incision and drainage was done in the ER and patient has been admitted for further IV antibiotics. Patient has previous history of IV drug abuse but states she has not had any IV drugs for many years.   Review of Systems: As presented in the history of presenting illness, rest negative.  Past Medical History  Diagnosis Date  . Chronic pain syndrome   . Cervical radiculopathy   . Osteoarthritis   . GERD (gastroesophageal reflux disease)   . Thrombocytopenia   . Leukocytopenia   . Peptic ulcer disease   . DJD (degenerative joint disease)   . Polysubstance (excluding opioids) dependence   . Anxiety   . Blood transfusion without reported diagnosis   . Osteoporosis   . Bipolar affective disorder     ADHD AND SCHZIOPHRENIC--PT GOES TO [place]. BLANKMAN  . Shortness of breath     WITH EXERTION; SMOKER  . Pneumonia 2013  . H/O hiatal hernia   . Seizures     SEIZURE AFTER BREATHING TX (ALBUTEROL) IN ER; ALSO PAST HX SEIZURES DURING DRUG DETOX  . Hepatitis C infection     STATES COMPLETED TREATMENT AND NO LONGER HAS INFECTION  . Complication of anesthesia     STATES  SHE WOKE UP TWICE DURING SURGERIES  . Hypertension    Past Surgical History  Procedure Laterality Date  . Tonsilectomy, adenoidectomy, bilateral myringotomy and tubes    . Right knee arthroscopic surgery    . Total hip replacement    . Appendectomy    . Hyperectomy      due to obstructive ovarian cytst  . Spine surgery      CERVIAL 6-7 SURGERY - FUSION  . Abdominal hysterectomy    . Joint replacement  2012    RIGHT TOTAL HIP REPLACEMENT  . Cholecystectomy    . Total hip arthroplasty Left 08/24/2012    Procedure: LEFT TOTAL HIP ARTHROPLASTY ANTERIOR APPROACH;  Surgeon: [name], MD;  Location: [facility];  Service: Orthopedics;  Laterality: Left;   Social History:  reports that she has been smoking Cigarettes.  She has a 10 pack-year smoking history. She has never used smokeless tobacco. She reports that she does not drink alcohol or use illicit drugs. Where does patient live home. Can patient participate in ADLs? Yes.  Allergies  Allergen Reactions  . Albuterol Other (See Comments)    PT states she had seizures from an albuterol treatment and was hospitalized for it  . Aspirin Hives    Vomits blood  . Adhesive [Tape] Rash    Family History:  Family History  Problem Relation Age of Onset  . Breast cancer Sister   . Cancer Sister   .  Heart disease Mother   . Heart disease Father       Prior to Admission medications   Medication Sig Start Date End Date Taking? Authorizing Provider  acetaminophen-codeine (TYLENOL #3) 300-30 MG per tablet Take 1-2 tablets by mouth every 4 (four) hours as needed for moderate pain.   Yes Historical Provider, MD  amphetamine-dextroamphetamine (ADDERALL) 20 MG tablet Take 1 tablet by mouth 3 (three) times daily. 09/29/14  Yes Historical Provider, MD  clindamycin (CLEOCIN) 300 MG capsule Take 1 capsule (300 mg total) by mouth 4 (four) times daily. X 7 days 10/04/14  Yes Pamella Pert, MD  esomeprazole (NEXIUM) 40 MG capsule Take 40 mg by mouth  daily at 12 noon.   Yes Historical Provider, MD  HYDROcodone-acetaminophen (NORCO) 5-325 MG per tablet Take 1 tablet by mouth every 4 (four) hours as needed. Patient taking differently: Take 1-2 tablets by mouth every 4 (four) hours as needed for moderate pain. To 6 hours 10/03/14  Yes Elnora Morrison, MD  lisinopril-hydrochlorothiazide (PRINZIDE,ZESTORETIC) 10-12.5 MG per tablet Take 1 tablet by mouth daily. 09/29/14  Yes Historical Provider, MD  potassium chloride SA (K-DUR,KLOR-CON) 20 MEQ tablet Take 20 mEq by mouth daily. 09/29/14  Yes Historical Provider, MD  QUEtiapine (SEROQUEL) 300 MG tablet Take 600 mg by mouth at bedtime.    Yes Historical Provider, MD  traMADol (ULTRAM) 50 MG tablet Take 50-100 mg by mouth 3 (three) times daily as needed for moderate pain.  09/29/14  Yes Historical Provider, MD  cephALEXin (KEFLEX) 500 MG capsule Take 1 capsule (500 mg total) by mouth 3 (three) times daily. Patient not taking: Reported on 10/05/2014 10/03/14   Elnora Morrison, MD  docusate sodium 100 MG CAPS Take 100 mg by mouth 2 (two) times daily. Patient not taking: Reported on 10/04/2014 08/26/12   Danae Orleans, PA-C  ferrous sulfate 325 (65 FE) MG tablet Take 1 tablet (325 mg total) by mouth 3 (three) times daily after meals. Patient not taking: Reported on 10/04/2014 08/26/12   Danae Orleans, PA-C  HYDROcodone-acetaminophen (NORCO/VICODIN) 5-325 MG per tablet Take 1-2 tablets by mouth every 6 (six) hours as needed. Patient not taking: Reported on 10/05/2014 10/04/14   Pamella Pert, MD  lisinopril (PRINIVIL,ZESTRIL) 5 MG tablet Take 1 tablet (5 mg total) by mouth daily. Patient not taking: Reported on 10/05/2014 08/14/14   Liam Graham, PA-C  oxyCODONE 10 MG TABS Take 1-2 tablets (10-20 mg total) by mouth every 4 (four) hours. Patient not taking: Reported on 10/02/2014 08/26/12   Danae Orleans, PA-C  QUEtiapine (SEROQUEL) 300 MG tablet Take 1 tablet (300 mg total) by mouth at bedtime. Patient not taking:  Reported on 10/04/2014 08/14/14   Liam Graham, PA-C  sulfamethoxazole-trimethoprim (BACTRIM DS,SEPTRA DS) 800-160 MG per tablet Take 1 tablet by mouth 2 (two) times daily. Patient not taking: Reported on 10/05/2014 10/03/14 10/10/14  Elnora Morrison, MD  tiZANidine (ZANAFLEX) 4 MG capsule Take 1 capsule (4 mg total) by mouth 3 (three) times daily. Muscle spasms Patient not taking: Reported on 10/02/2014 08/26/12   Danae Orleans, PA-C    Physical Exam: Filed Vitals:   10/05/14 1535 10/05/14 1745 10/05/14 1932  BP: 125/63 112/59 139/71  Pulse: 90 82   Temp: 98.1 F (36.7 C)    TempSrc: Oral    Resp: 18 18   SpO2: 100% 96%      General:  Well-developed and nourished.  Eyes: Anicteric no pallor.  ENT: No discharge from the ears eyes nose or  mouth.  Neck: No mass felt.  Cardiovascular: S1 and S2 heard.  Respiratory: No rhonchi or crepitations.  Abdomen: Soft nontender bowel sounds present.  Skin: Patient's right forearm at this time is dressed but still we can see some erythema around the distal forearm and distal arm. Pulses felt.  Musculoskeletal: See skin section.  Psychiatric: Appears normal.  Neurologic: Alert awake oriented to time place and person. Moves all extremities.  Labs on Admission:  Basic Metabolic Panel:  Recent Labs Lab 10/02/14 2052 10/04/14 1102 10/05/14 1724  NA 136 135 131*  K 4.7 3.6 4.3  CL 99* 98* 102  CO2 28 26 24   GLUCOSE 89 97 119*  BUN 16 16 21*  CREATININE 0.93 0.93 1.05*  CALCIUM 9.4 8.9 8.6*   Liver Function Tests:  Recent Labs Lab 10/02/14 2052  AST 46*  ALT 23  ALKPHOS 75  BILITOT 1.0  PROT 7.5  ALBUMIN 3.7   No results for input(s): LIPASE, AMYLASE in the last 168 hours. No results for input(s): AMMONIA in the last 168 hours. CBC:  Recent Labs Lab 10/02/14 2052 10/04/14 1102 10/05/14 1724  WBC 7.1 10.2 7.0  NEUTROABS 4.8 7.0 4.6  HGB 11.6* 11.4* 11.3*  HCT 35.5* 34.9* 33.8*  MCV 92.7 92.6 92.6  PLT 120* 90*  108*   Cardiac Enzymes: No results for input(s): CKTOTAL, CKMB, CKMBINDEX, TROPONINI in the last 168 hours.  BNP (last 3 results) No results for input(s): BNP in the last 8760 hours.  ProBNP (last 3 results) No results for input(s): PROBNP in the last 8760 hours.  CBG: No results for input(s): GLUCAP in the last 168 hours.  Radiological Exams on Admission: No results found.   Assessment/Plan Principal Problem:   Cellulitis of right forearm Active Problems:   Thrombocytopenia   ARF (acute renal failure)   History of bipolar disorder   History of cirrhosis   Essential hypertension   1. Right forearm cellulitis and abscess - abscess was drained by the ER physician. At this time I have placed patient on vancomycin and cefepime. Check x-rays to rule out any foreign body. Keep right arm elevated. Follow cultures. 2. Acute renal failure - I will hold off patient's lisinopril/HCTZ and gently hydrate and closely follow metabolic panel. 3. Hypertension - since I'm holding off HCTZ and lisinopril I have placed patient on when necessary IV hydralazine. 4. Chronic thrombocytopenia probably from cirrhosis of liver - follow CBC. 5. Cirrhosis of liver and hepatitis C - patient's hepatitis C was previously treated and patient will need follow-up with GI once patient establishes care. 6. Chronic anemia - follow CBC.   DVT Prophylaxis SCDs. Since patient has thrombocytopenia.  Code Status: Full code.  Family Communication: Discussed with patient.  Disposition Plan: Admit to inpatient. Likely stay 2 days.    Britlee Skolnik N. [facility] Pager [phone].  If 7PM-7AM, please contact night-coverage [email or web address] Password [facility] 10/05/2014, 8:50 PM

## 2014-10-05 NOTE — ED Notes (Signed)
Pt walked out of ED to "lock my truck"

## 2014-10-05 NOTE — Progress Notes (Signed)
EDCM spoke to patient at bedside.  Patient reports she has just moved to [facility].  Patient listed as having Medicare and Medicaid insurance.  Patient's pcp listed as [facility].  Patient reports she is unhappy at [facility] clinic.  [facility] provided patient with list of pcps who accept Medicare insurance within a ten mile radius of her zip code [phone].  EDCM also provided patient with list of pcps who accept Medicaid in [facility].  Patient thankful for resources.  No further EDCM needs at this time.

## 2014-10-05 NOTE — ED Notes (Signed)
I attempted to collect patient labs and was unsuccessful.

## 2014-10-05 NOTE — ED Provider Notes (Signed)
CSN: [id number]     Arrival date & time 10/05/14  1514 History   First MD Initiated Contact with Patient 10/05/14 1555     Chief Complaint  Patient presents with  . Cellulitis   Patient gave verbal permission to utilize photo for medical documentation only The image was not stored on any personal device  Patient is a 54 y.o. female presenting with rash. The history is provided by the patient.  Rash Location:  Shoulder/arm Shoulder/arm rash location:  R forearm Severity:  Moderate Onset quality:  Gradual Duration: several days. Timing:  Constant Progression:  Worsening Chronicity:  New Relieved by:  Nothing Worsened by:  Contact Associated symptoms: fever and nausea   Associated symptoms: not vomiting   Pt presents for rash/cellulitis to right arm She reports she has been seen in ED for this condition, she has been on multiple oral antibiotics but no improvement She reports fever at home She reports arm pain She denies recent IVDA  Past Medical History  Diagnosis Date  . Chronic pain syndrome   . Cervical radiculopathy   . Osteoarthritis   . GERD (gastroesophageal reflux disease)   . Thrombocytopenia   . Leukocytopenia   . Peptic ulcer disease   . DJD (degenerative joint disease)   . Polysubstance (excluding opioids) dependence   . Anxiety   . Blood transfusion without reported diagnosis   . Osteoporosis   . Bipolar affective disorder     ADHD AND SCHZIOPHRENIC--PT GOES TO [facility]. BLANKMAN  . Shortness of breath     WITH EXERTION; SMOKER  . Pneumonia 2013  . H/O hiatal hernia   . Seizures     SEIZURE AFTER BREATHING TX (ALBUTEROL) IN ER; ALSO PAST HX SEIZURES DURING DRUG DETOX  . Hepatitis C infection     STATES COMPLETED TREATMENT AND NO LONGER HAS INFECTION  . Complication of anesthesia     STATES SHE WOKE UP TWICE DURING SURGERIES  . Hypertension    Past Surgical History  Procedure Laterality Date  . Tonsilectomy, adenoidectomy,  bilateral myringotomy and tubes    . Right knee arthroscopic surgery    . Total hip replacement    . Appendectomy    . Hyperectomy      due to obstructive ovarian cytst  . Spine surgery      CERVIAL 6-7 SURGERY - FUSION  . Abdominal hysterectomy    . Joint replacement  2012    RIGHT TOTAL HIP REPLACEMENT  . Cholecystectomy    . Total hip arthroplasty Left 08/24/2012    Procedure: LEFT TOTAL HIP ARTHROPLASTY ANTERIOR APPROACH;  Surgeon: [name], MD;  Location: [facility];  Service: Orthopedics;  Laterality: Left;   Family History  Problem Relation Age of Onset  . Breast cancer Sister   . Cancer Sister   . Heart disease Mother   . Heart disease Father    History  Substance Use Topics  . Smoking status: Current Some Day Smoker -- 0.50 packs/day for 20 years    Types: Cigarettes  . Smokeless tobacco: Never Used  . Alcohol Use: No     Comment: HX OF ABUSE OF PAIN MEDS--PT IS CURRENTLY IN TREATMENT AT [facility] IN [place]   OB History    No data available     Review of Systems  Constitutional: Positive for fever.  Cardiovascular: Negative for chest pain.  Gastrointestinal: Positive for nausea. Negative for vomiting.  Skin: Positive for rash and wound.  Neurological: Negative for weakness.  All other systems reviewed and are negative.     Allergies  Albuterol; Aspirin; and Adhesive  Home Medications   Prior to Admission medications   Medication Sig Start Date End Date Taking? Authorizing Provider  acetaminophen-codeine (TYLENOL #3) 300-30 MG per tablet Take 1-2 tablets by mouth every 4 (four) hours as needed for moderate pain.    Historical Provider, MD  amphetamine-dextroamphetamine (ADDERALL) 20 MG tablet Take 1 tablet by mouth 3 (three) times daily. 09/29/14   Historical Provider, MD  cephALEXin (KEFLEX) 500 MG capsule Take 1 capsule (500 mg total) by mouth 3 (three) times daily. Patient taking differently: Take 500 mg by mouth 3 (three) times  daily. For 7 days 10/03/14   Elnora Morrison, MD  clindamycin (CLEOCIN) 300 MG capsule Take 1 capsule (300 mg total) by mouth 4 (four) times daily. X 7 days 10/04/14   Pamella Pert, MD  docusate sodium 100 MG CAPS Take 100 mg by mouth 2 (two) times daily. Patient not taking: Reported on 10/04/2014 08/26/12   Danae Orleans, PA-C  esomeprazole (NEXIUM) 40 MG capsule Take 40 mg by mouth daily at 12 noon.    Historical Provider, MD  ferrous sulfate 325 (65 FE) MG tablet Take 1 tablet (325 mg total) by mouth 3 (three) times daily after meals. Patient not taking: Reported on 10/04/2014 08/26/12   Danae Orleans, PA-C  HYDROcodone-acetaminophen (NORCO) 5-325 MG per tablet Take 1 tablet by mouth every 4 (four) hours as needed. Patient taking differently: Take 1 tablet by mouth every 4 (four) hours as needed for moderate pain.  10/03/14   Elnora Morrison, MD  HYDROcodone-acetaminophen (NORCO/VICODIN) 5-325 MG per tablet Take 1-2 tablets by mouth every 6 (six) hours as needed. 10/04/14   Pamella Pert, MD  lisinopril (PRINIVIL,ZESTRIL) 5 MG tablet Take 1 tablet (5 mg total) by mouth daily. Patient not taking: Reported on 10/02/2014 08/14/14   Liam Graham, PA-C  lisinopril-hydrochlorothiazide (PRINZIDE,ZESTORETIC) 10-12.5 MG per tablet Take 1 tablet by mouth daily. 09/29/14   Historical Provider, MD  methylPREDNISolone (MEDROL DOSEPAK) 4 MG TBPK tablet Take 4-8 mg by mouth daily as needed (for pain).    Historical Provider, MD  oxyCODONE 10 MG TABS Take 1-2 tablets (10-20 mg total) by mouth every 4 (four) hours. Patient not taking: Reported on 10/02/2014 08/26/12   Danae Orleans, PA-C  polyethylene glycol (MIRALAX / Floria Raveling) packet Take 17 g by mouth 2 (two) times daily. Patient not taking: Reported on 10/02/2014 08/26/12   Danae Orleans, PA-C  potassium chloride SA (K-DUR,KLOR-CON) 20 MEQ tablet Take 20 mEq by mouth daily. 09/29/14   Historical Provider, MD  QUEtiapine (SEROQUEL) 300 MG tablet Take 600 mg by mouth at  bedtime.     Historical Provider, MD  QUEtiapine (SEROQUEL) 300 MG tablet Take 1 tablet (300 mg total) by mouth at bedtime. Patient not taking: Reported on 10/04/2014 08/14/14   Liam Graham, PA-C  sulfamethoxazole-trimethoprim (BACTRIM DS,SEPTRA DS) 800-160 MG per tablet Take 1 tablet by mouth 2 (two) times daily. Patient taking differently: Take 1 tablet by mouth 2 (two) times daily. Started 05/10 for 5 days 10/03/14 10/10/14  Elnora Morrison, MD  tiZANidine (ZANAFLEX) 4 MG capsule Take 1 capsule (4 mg total) by mouth 3 (three) times daily. Muscle spasms Patient not taking: Reported on 10/02/2014 08/26/12   Danae Orleans, PA-C  traMADol (ULTRAM) 50 MG tablet Take 50-100 mg by mouth 3 (three) times daily as needed for moderate pain.  09/29/14  Historical Provider, MD   BP 125/63 mmHg  Pulse 90  Temp(Src) 98.1 F (36.7 C) (Oral)  Resp 18  SpO2 100% Physical Exam CONSTITUTIONAL: Well developed/well nourished anxious HEAD: Normocephalic/atraumatic EYES: EOMI/PERRL ENMT: Mucous membranes moist NECK: supple no meningeal signs SPINE/BACK:entire spine nontender CV: S1/S2 noted, no murmurs/rubs/gallops noted LUNGS: Lungs are clear to auscultation bilaterally, no apparent distress ABDOMEN: soft, nontender, no rebound or guarding, bowel sounds noted throughout abdomen GU:no cva tenderness NEURO: Pt is awake/alert/appropriate, moves all extremitiesx4.  No facial droop.   EXTREMITIES: pulses normal/equal, full ROM, see photo below.  No crepitus to right forearm SKIN: warm, color normal PSYCH: anxious     ED Course  Procedures  INCISION AND DRAINAGE Performed by: Sharyon Cable Consent: Verbal consent obtained. Risks and benefits: risks, benefits and alternatives were discussed Type: abscess  Body area: right forearm  Anesthesia: local infiltration  Incision was made with a scalpel.  Local anesthetic: lidocaine 1%   Anesthetic total: 5 ml  Complexity: complex Blunt dissection to  break up loculations  Drainage: purulent  Drainage amount: moderate   Patient tolerance: Patient tolerated the procedure well with no immediate complications.   EMERGENCY DEPARTMENT US SOFT TISSUE INTERPRETATION "Study: Limited Ultrasound of the noted body part in comments below"  INDICATIONS: Soft tissue infection Multiple views of the body part are obtained with a multi-frequency linear probe  PERFORMED BY:  Myself  IMAGES ARCHIVED?: Yes  SIDE:Right   BODY PART:Upper extremity  FINDINGS: Abcess present  LIMITATIONS:  Emergent Procedure  INTERPRETATION:  Abcess present    Labs Review Labs Reviewed  CBC WITH DIFFERENTIAL/PLATELET - Abnormal; Notable for the following:    RBC 3.65 (*)    Hemoglobin 11.3 (*)    HCT 33.8 (*)    Platelets 108 (*)    All other components within normal limits  BASIC METABOLIC PANEL - Abnormal; Notable for the following:    Sodium 131 (*)    Glucose, Bld 119 (*)    BUN 21 (*)    Creatinine, Ser 1.05 (*)    Calcium 8.6 (*)    GFR calc non Af Amer 60 (*)    All other components within normal limits   Medications  vancomycin (VANCOCIN) IVPB 1000 mg/200 mL premix (1,000 mg Intravenous New Bag/Given 10/05/14 1737)  lidocaine (PF) (XYLOCAINE) 1 % injection 5 mL (not administered)  HYDROmorphone (DILAUDID) injection 1 mg (not administered)  HYDROmorphone (DILAUDID) injection 1 mg (1 mg Intravenous Given 10/05/14 1736)  ondansetron (ZOFRAN) injection 4 mg (4 mg Intravenous Given 10/05/14 1736)  HYDROmorphone (DILAUDID) injection 1 mg (1 mg Intravenous Given 10/05/14 1833)    5:29 PM Pt will require admission for failure of outpatient therapy Will need I&D in the ED Pt has already been on keflex/bactrim/clindamycin 7:02 PM Will admit for IV antibiotics D/w dr Hal Hope will admit to med-surg MDM   Final diagnoses:  Cellulitis of right forearm  Abscess of right forearm    Nursing notes including past medical history and social  history reviewed and considered in documentation Previous records reviewed and considered Labs/vital reviewed myself and considered during evaluation    Ripley Fraise, MD 10/05/14 1911

## 2014-10-05 NOTE — Progress Notes (Signed)
ANTIBIOTIC CONSULT NOTE - INITIAL  Pharmacy Consult for Vancomycin/Cefepime Indication: cellulitis / wound infection  Allergies  Allergen Reactions  . Albuterol Other (See Comments)    PT states she had seizures from an albuterol treatment and was hospitalized for it  . Aspirin Hives    Vomits blood  . Adhesive [Tape] Rash    Patient Measurements: Height: 5\' 7"  (170.2 cm) Weight: 180 lb 1.9 oz (81.7 kg) IBW/kg (Calculated) : 61.6 Adjusted Body Weight:   Vital Signs: Temp: 98.6 F (37 C) (05/12 2052) Temp Source: Oral (05/12 2052) BP: 119/95 mmHg (05/12 2052) Pulse Rate: 67 (05/12 2052) Intake/Output from previous day:   Intake/Output from this shift:    Labs:  Recent Labs  10/04/14 1102 10/05/14 1724  WBC 10.2 7.0  HGB 11.4* 11.3*  PLT 90* 108*  CREATININE 0.93 1.05*   Estimated Creatinine Clearance: 68.1 mL/min (by C-G formula based on Cr of 1.05). No results for input(s): VANCOTROUGH, VANCOPEAK, VANCORANDOM, GENTTROUGH, GENTPEAK, GENTRANDOM, TOBRATROUGH, TOBRAPEAK, TOBRARND, AMIKACINPEAK, AMIKACINTROU, AMIKACIN in the last 72 hours.   Microbiology: No results found for this or any previous visit (from the past 720 hour(s)).  Medical History: Past Medical History  Diagnosis Date  . Chronic pain syndrome   . Cervical radiculopathy   . Osteoarthritis   . GERD (gastroesophageal reflux disease)   . Thrombocytopenia   . Leukocytopenia   . Peptic ulcer disease   . DJD (degenerative joint disease)   . Polysubstance (excluding opioids) dependence   . Anxiety   . Blood transfusion without reported diagnosis   . Osteoporosis   . Bipolar affective disorder     ADHD AND SCHZIOPHRENIC--PT GOES TO [facility]. BLANKMAN  . Shortness of breath     WITH EXERTION; SMOKER  . Pneumonia 2013  . H/O hiatal hernia   . Seizures     SEIZURE AFTER BREATHING TX (ALBUTEROL) IN ER; ALSO PAST HX SEIZURES DURING DRUG DETOX  . Hepatitis C infection     STATES  COMPLETED TREATMENT AND NO LONGER HAS INFECTION  . Complication of anesthesia     STATES SHE WOKE UP TWICE DURING SURGERIES  . Hypertension     Medications:  Scheduled:  . amphetamine-dextroamphetamine  20 mg Oral TID  . ceFEPime (MAXIPIME) IV  2 g Intravenous Q12H  . pantoprazole  40 mg Oral Daily  . potassium chloride SA  20 mEq Oral Daily  . QUEtiapine  600 mg Oral QHS  . vancomycin  1,000 mg Intravenous Q12H   Infusions:  . sodium chloride     PRN: acetaminophen **OR** acetaminophen, acetaminophen-codeine, hydrALAZINE, HYDROmorphone (DILAUDID) injection, ondansetron **OR** ondansetron (ZOFRAN) IV, traMADol Assessment: 54 yo F with wound infection and cellulitis. Symptoms of rash,fever and nausea. Has previously taken Keflex, Bactrim, and Clindamycin. Admitted now for IV antibiotics.  Goal of Therapy:  Vancomycin trough level 10-15 mcg/ml  Eradication of infection  Plan:  1.  Vancomycin 1000mg  IV Q 12 hours. 2.  Cefepime 2 Gm IV q 12 hours. Check Vancomycin trough level at steady state.   Gypsy Decant 10/05/2014,9:14 PM

## 2014-10-05 NOTE — Progress Notes (Signed)
Utilization Review completed.  Allyssa Abruzzese RN CM  

## 2014-10-05 NOTE — ED Notes (Signed)
Pt here for 3rd day with insect bite.  Redness and swelling has increased. Pt has had oral and IV antibiotics with no success.

## 2014-10-06 LAB — CBC WITH DIFFERENTIAL/PLATELET
BASOS ABS: 0 10*3/uL (ref 0.0–0.1)
Basophils Relative: 0 % (ref 0–1)
EOS ABS: 0.1 10*3/uL (ref 0.0–0.7)
EOS PCT: 2 % (ref 0–5)
HCT: 31.6 % — ABNORMAL LOW (ref 36.0–46.0)
Hemoglobin: 10.3 g/dL — ABNORMAL LOW (ref 12.0–15.0)
LYMPHS ABS: 1.8 10*3/uL (ref 0.7–4.0)
Lymphocytes Relative: 24 % (ref 12–46)
MCH: 30.5 pg (ref 26.0–34.0)
MCHC: 32.6 g/dL (ref 30.0–36.0)
MCV: 93.5 fL (ref 78.0–100.0)
Monocytes Absolute: 1 10*3/uL (ref 0.1–1.0)
Monocytes Relative: 14 % — ABNORMAL HIGH (ref 3–12)
Neutro Abs: 4.4 10*3/uL (ref 1.7–7.7)
Neutrophils Relative %: 60 % (ref 43–77)
Platelets: 113 10*3/uL — ABNORMAL LOW (ref 150–400)
RBC: 3.38 MIL/uL — ABNORMAL LOW (ref 3.87–5.11)
RDW: 13.4 % (ref 11.5–15.5)
WBC: 7.4 10*3/uL (ref 4.0–10.5)

## 2014-10-06 LAB — COMPREHENSIVE METABOLIC PANEL
ALT: 26 U/L (ref 14–54)
AST: 31 U/L (ref 15–41)
Albumin: 3.4 g/dL — ABNORMAL LOW (ref 3.5–5.0)
Alkaline Phosphatase: 70 U/L (ref 38–126)
Anion gap: 10 (ref 5–15)
BILIRUBIN TOTAL: 0.7 mg/dL (ref 0.3–1.2)
BUN: 21 mg/dL — AB (ref 6–20)
CO2: 24 mmol/L (ref 22–32)
Calcium: 8.5 mg/dL — ABNORMAL LOW (ref 8.9–10.3)
Chloride: 97 mmol/L — ABNORMAL LOW (ref 101–111)
Creatinine, Ser: 1.03 mg/dL — ABNORMAL HIGH (ref 0.44–1.00)
GFR calc Af Amer: >60 mL/min (ref >60)
GFR calc non Af Amer: >60 mL/min (ref >60)
GLUCOSE: 111 mg/dL — AB (ref 65–99)
POTASSIUM: 4.5 mmol/L (ref 3.5–5.1)
SODIUM: 131 mmol/L — AB (ref 135–145)
Total Protein: 7.1 g/dL (ref 6.5–8.1)

## 2014-10-06 MED ORDER — HYDROMORPHONE HCL 1 MG/ML IJ SOLN
1.0000 mg | INTRAMUSCULAR | Status: DC | PRN
  Administered 2014-10-06 – 2014-10-07 (×6): 1 mg via INTRAVENOUS
  Filled 2014-10-06 (×6): qty 1

## 2014-10-06 MED ORDER — NICOTINE 14 MG/24HR TD PT24
14.0000 mg | MEDICATED_PATCH | TRANSDERMAL | Status: DC
  Administered 2014-10-06: 14 mg via TRANSDERMAL
  Filled 2014-10-06 (×2): qty 1

## 2014-10-06 NOTE — Progress Notes (Signed)
Admitted to room 1340 with cellulitis rt insect bite to right forearm. VSS, afebrile. Oriented to room and unit. Up as tol. C/o pain 9/10.

## 2014-10-06 NOTE — Progress Notes (Signed)
Called ED to get report on patient. 

## 2014-10-06 NOTE — Progress Notes (Signed)
Patient ID: ANNAH JASKO, female   DOB: [birth year], 54 y.o.   MRN: [id number]  [facility] PROGRESS NOTE  TACY CHAVIS OMV:[id number] DOB: [birth year] DOA: 10/05/2014 [facility]: [facility]   Brief narrative:    54 y.o. female with history of cirrhosis of liver, bipolar disorder, chronic thrombocytopenia, hepatitis C and hypertension presents to the ER because of worsening swelling and pain of the right forearm. Patient describes pain as constant and throbbing, 10 out of 10 in severity, radiating to the entire right shoulder and arm, worse with movement and with no specific alleviating factors, no similar events in the past. Please note that patient has been in emergency department several times for the same problem in the past few weeks and has left prior to completing therapy. Please also note that patient has previous history of IV drug abuse but has adamantly denied drug use for many years.  Assessment/Plan:    Principal Problem:   Cellulitis of right forearm - Extensive cellulitis, continue current antibiotic vancomycin and Maxipime day 2 - Status post I&D in the emergency department, postop day 1 - Continue to provide supportive care with analgesia as needed, keep extremity elevated - Readjust antibiotic regimen as clinically indicated Active Problems:   Thrombocytopenia - Secondary to liver cirrhosis, history of nor alcohol abuse  - No signs of active bleeding, repeat CBC in the morning    ARF (acute renal failure) - secondary to prerenal etiology in the setting of cellulitis  - Continue IV fluids and repeat BMP in the morning    History of bipolar disorder - Appears to be at baseline status at this time  - Continue Seroquel    History of cirrhosis - In the setting of alcohol abuse - LFTs are stable this morning   Essential hypertension - Reasonable inpatient control    Anemia of chronic disease, iron deficiency 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  Family Communication:  plan of care discussed with the patient Disposition Plan: Barrier to discharge - still requiring IV antibiotics  IV access:  Peripheral IV  Procedures and diagnostic studies:    No results found.  Medical Consultants:  None  Other Consultants:  None  IAnti-Infectives:   Vancomycin 10/05/2014 --> Maxipime 10/05/2014 -->  Faye Ramsay, MD  [facility] Pager [phone]  If 7PM-7AM, please contact night-coverage [email or web address] Password [facility] 10/06/2014, 12:41 PM   LOS: 1 day   HPI/Subjective: No events overnight. Still with significant right upper extremity pain, 5 out of 10.  Objective: Filed Vitals:   10/05/14 2052 10/05/14 2120 10/06/14 0527 10/06/14 0554  BP: 119/95  112/59   Pulse: 67 78 79   Temp: 98.6 F (37 C)  98.3 F (36.8 C)   TempSrc: Oral  Oral   Resp: 18  18   Height: 5\' 7"  (1.702 m)     Weight: 81.7 kg (180 lb 1.9 oz)   81.647 kg (180 lb)  SpO2: 97%  98%     Intake/Output Summary (Last 24 hours) at 10/06/14 1241 Last data filed at 10/06/14 0856  Gross per 24 hour  Intake   1258 ml  Output    800 ml  Net    458 ml    Exam:   General:  Pt is alert, follows commands appropriately, not in acute distress  Cardiovascular: Regular rate and rhythm, S1/S2, no murmurs, no  rubs, no gallops  Respiratory: Clear to auscultation bilaterally, no wheezing, no crackles, no rhonchi  Abdomen: Soft, non tender, non distended, bowel sounds present, no guarding  Extremities: Right upper extremity edema and erythema from fingertips extending to elbow, tender to palpation  Neuro: Grossly nonfocal  Data Reviewed: Basic Metabolic Panel:  Recent Labs Lab 10/02/14 2052 10/04/14 1102 10/05/14 1724 10/06/14 0625  NA 136 135 131* 131*  K 4.7 3.6 4.3 4.5  CL 99* 98* 102 97*  CO2 28 26 24 24   GLUCOSE 89 97 119* 111*  BUN 16 16 21* 21*  CREATININE  0.93 0.93 1.05* 1.03*  CALCIUM 9.4 8.9 8.6* 8.5*   Liver Function Tests:  Recent Labs Lab 10/02/14 2052 10/06/14 0625  AST 46* 31  ALT 23 26  ALKPHOS 75 70  BILITOT 1.0 0.7  PROT 7.5 7.1  ALBUMIN 3.7 3.4*   CBC:  Recent Labs Lab 10/02/14 2052 10/04/14 1102 10/05/14 1724 10/06/14 0625  WBC 7.1 10.2 7.0 7.4  NEUTROABS 4.8 7.0 4.6 4.4  HGB 11.6* 11.4* 11.3* 10.3*  HCT 35.5* 34.9* 33.8* 31.6*  MCV 92.7 92.6 92.6 93.5  PLT 120* 90* 108* 113*   Scheduled Meds: . amphetamine-dextroamphetamine  20 mg Oral TID  . ceFEPime (MAXIPIME) IV  2 g Intravenous Q12H  . pantoprazole  40 mg Oral Daily  . potassium chloride SA  20 mEq Oral Daily  . QUEtiapine  600 mg Oral QHS  . vancomycin  1,000 mg Intravenous Q12H   Continuous Infusions: . sodium chloride 75 mL/hr at 10/06/14 [phone]

## 2014-10-06 NOTE — Care Management Note (Signed)
Case Management Note  Patient Details  Name: [name] MRN: [id number] Date of Birth: Aug 29, 1960  Please see [facility] note. Pt also provided with [facility] brochure and with [facility] physician finder number [phone]. Pt appreciative of CM assistance. No other DC needs noted at this time.  Lynnell Catalan, RN 10/06/2014, 3:18 PM

## 2014-10-07 ENCOUNTER — Inpatient Hospital Stay (HOSPITAL_COMMUNITY): Payer: Medicare Other

## 2014-10-07 LAB — BASIC METABOLIC PANEL
Anion gap: 9 (ref 5–15)
BUN: 16 mg/dL (ref 6–20)
CALCIUM: 8.7 mg/dL — AB (ref 8.9–10.3)
CO2: 22 mmol/L (ref 22–32)
CREATININE: 0.78 mg/dL (ref 0.44–1.00)
Chloride: 105 mmol/L (ref 101–111)
GFR calc Af Amer: >60 mL/min (ref >60)
GFR calc non Af Amer: >60 mL/min (ref >60)
Glucose, Bld: 110 mg/dL — ABNORMAL HIGH (ref 65–99)
POTASSIUM: 5.4 mmol/L — AB (ref 3.5–5.1)
Sodium: 136 mmol/L (ref 135–145)

## 2014-10-07 LAB — CBC
HCT: 32.4 % — ABNORMAL LOW (ref 36.0–46.0)
HEMOGLOBIN: 10.7 g/dL — AB (ref 12.0–15.0)
MCH: 30.6 pg (ref 26.0–34.0)
MCHC: 33 g/dL (ref 30.0–36.0)
MCV: 92.6 fL (ref 78.0–100.0)
PLATELETS: 124 10*3/uL — AB (ref 150–400)
RBC: 3.5 MIL/uL — AB (ref 3.87–5.11)
RDW: 13.5 % (ref 11.5–15.5)
WBC: 4.4 10*3/uL (ref 4.0–10.5)

## 2014-10-07 MED ORDER — OXYCODONE-ACETAMINOPHEN 5-325 MG PO TABS: 1.0000 | ORAL_TABLET | ORAL | Status: DC | PRN

## 2014-10-07 MED ORDER — ALPRAZOLAM 0.5 MG PO TABS
0.5000 mg | ORAL_TABLET | Freq: Two times a day (BID) | ORAL | Status: DC | PRN
  Administered 2014-10-07: 0.5 mg via ORAL
  Filled 2014-10-07: qty 1

## 2014-10-07 MED ORDER — OXYCODONE-ACETAMINOPHEN 5-325 MG PO TABS
1.0000 | ORAL_TABLET | ORAL | Status: DC | PRN
  Administered 2014-10-07 (×2): 2 via ORAL
  Filled 2014-10-07 (×2): qty 2

## 2014-10-07 MED ORDER — SODIUM POLYSTYRENE SULFONATE 15 GM/60ML PO SUSP
30.0000 g | Freq: Once | ORAL | Status: AC
  Administered 2014-10-07: 30 g via ORAL
  Filled 2014-10-07: qty 120

## 2014-10-07 MED ORDER — CLINDAMYCIN HCL 300 MG PO CAPS: 300.0000 mg | ORAL_CAPSULE | Freq: Four times a day (QID) | ORAL | Status: DC

## 2014-10-07 NOTE — Discharge Summary (Signed)
Physician Discharge Summary  [name]:[id number] DOB: 05-26-1961 DOA: 10/05/2014  PCP: [place] date: 10/05/2014 Discharge date: 10/07/2014  Recommendations for Outpatient Follow-up:  1. Pt will need to follow up with PCP in 2 weeks post discharge 2. Please obtain BMP to evaluate electrolytes and kidney function, potassium level  3. Please also check CBC to evaluate Hg and Hct levels 4. Pt advised to continue Clindamycin upon discharge, we have reviewed MRI findings and pt insisted on going home with close outpatient follow up   Discharge Diagnoses:  Principal Problem:   Cellulitis of right forearm Active Problems:   Thrombocytopenia   ARF (acute renal failure)   History of bipolar disorder   History of cirrhosis   Essential hypertension  Discharge Condition: Stable  Diet recommendation: Heart healthy diet discussed in details    Brief narrative:    54 y.o. female with history of cirrhosis of liver, bipolar disorder, chronic thrombocytopenia, hepatitis C and hypertension presents to the ER because of worsening swelling and pain of the right forearm. Patient describes pain as constant and throbbing, 10/10 in severity, radiating to the entire right shoulder and arm, worse with movement and with no specific alleviating factors, no similar events in the past. Please note that patient has been in emergency department several times for the same problem in the past few weeks and has left prior to completing therapy. Please also note that patient has previous history of IV drug abuse but has adamantly denied drug use for many years.  Assessment/Plan:    Principal Problem:  Cellulitis of right forearm - Extensive cellulitis, continue current antibiotic vancomycin and Maxipime day 2 - Status post I&D in the emergency department, postop day 1 - ABX transitioned to Clindamycin upon discharge  - MRI hand obtained and discussed findings with pt - pt insisting  on going home with oral ABX and outpatient follow up  Active Problems:   Hyperkalemia - pt has been taking oral supplements, advised to stop - needs to see PCP and have BMP repeated in an outpatient follow up  Thrombocytopenia - Secondary to liver cirrhosis, history of nor alcohol abuse  - No signs of active bleeding  ARF (acute renal failure) - secondary to prerenal etiology in the setting of cellulitis  - IVF provided and Cr is now WNL  History of bipolar disorder - Appears to be at baseline status at this time   History of cirrhosis - In the setting of alcohol abuse - LFTs are stable this morning  Essential hypertension - Reasonable inpatient control   Anemia of chronic disease, iron deficiency 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  Family Communication: plan of care discussed with the patient Disposition Plan:Pt insisting on going home   IV access:  Peripheral IV  Procedures and diagnostic studies:    Imaging Results    No results found.    Medical Consultants:  None  Other Consultants:  None  IAnti-Infectives:   Vancomycin 10/05/2014 --> 5/14 Maxipime 10/05/2014 --> 5/14 Clindamycin upon discharge       Discharge Exam: Filed Vitals:   10/07/14 0545  BP: 119/65  Pulse: 65  Temp: 97.6 F (36.4 C)  Resp: 18   Filed Vitals:   10/06/14 0554 10/06/14 1429 10/06/14 2100 10/07/14 0545  BP:  124/62 110/71 119/65  Pulse:  73 81 65  Temp:  98.5 F (36.9 C) 98.1 F (36.7 C) 97.6 F (36.4 C)  TempSrc:  Oral Oral Oral  Resp:  18 18 18   Height:      Weight: 81.647 kg (180 lb)   86.637 kg (191 lb)  SpO2:  100% 100% 100%    General: Pt is alert, follows commands appropriately, not in acute distress Cardiovascular: Regular rate and rhythm, S1/S2 +, no murmurs, no rubs, no gallops Respiratory: Clear to auscultation bilaterally,  no wheezing, no crackles, no rhonchi Abdominal: Soft, non tender, non distended, bowel sounds +, no guarding Extremities:right hand edema with erythema from wrist to 2 inches above elbow, with TTP   Discharge Instructions     Medication List    STOP taking these medications        cephALEXin 500 MG capsule  Commonly known as:  KEFLEX     HYDROcodone-acetaminophen 5-325 MG per tablet  Commonly known as:  NORCO/VICODIN     Oxycodone HCl 10 MG Tabs     sulfamethoxazole-trimethoprim 800-160 MG per tablet  Commonly known as:  BACTRIM DS,SEPTRA DS     tiZANidine 4 MG capsule  Commonly known as:  ZANAFLEX      TAKE these medications        acetaminophen-codeine 300-30 MG per tablet  Commonly known as:  TYLENOL #3  Take 1-2 tablets by mouth every 4 (four) hours as needed for moderate pain.     amphetamine-dextroamphetamine 20 MG tablet  Commonly known as:  ADDERALL  Take 1 tablet by mouth 3 (three) times daily.     clindamycin 300 MG capsule  Commonly known as:  CLEOCIN  Take 1 capsule (300 mg total) by mouth 4 (four) times daily. X 7 days     DSS 100 MG Caps  Take 100 mg by mouth 2 (two) times daily.     esomeprazole 40 MG capsule  Commonly known as:  NEXIUM  Take 40 mg by mouth daily at 12 noon.     ferrous sulfate 325 (65 FE) MG tablet  Take 1 tablet (325 mg total) by mouth 3 (three) times daily after meals.     lisinopril 5 MG tablet  Commonly known as:  PRINIVIL,ZESTRIL  Take 1 tablet (5 mg total) by mouth daily.     lisinopril-hydrochlorothiazide 10-12.5 MG per tablet  Commonly known as:  PRINZIDE,ZESTORETIC  Take 1 tablet by mouth daily.     oxyCODONE-acetaminophen 5-325 MG per tablet  Commonly known as:  PERCOCET/ROXICET  Take 1-2 tablets by mouth every 3 (three) hours as needed for moderate pain.     potassium chloride SA 20 MEQ tablet  Commonly known as:  K-DUR,KLOR-CON  Take 20 mEq by mouth daily.     QUEtiapine 300 MG tablet  Commonly known as:   SEROQUEL  Take 600 mg by mouth at bedtime.     traMADol 50 MG tablet  Commonly known as:  ULTRAM  Take 50-100 mg by mouth 3 (three) times daily as needed for moderate pain.             Follow-up Information    Schedule an appointment as soon as possible for a visit with [facility].   Specialty:  Family Medicine   Contact information:   [address] [place] [address] [phone]       Call Faye Ramsay, MD.   Specialty:  Internal Medicine   Why:  As needed   Contact information:   [facility]  [address] [place] [address] [phone]        The results of significant diagnostics from this hospitalization (including imaging, microbiology, ancillary and laboratory) are listed below for reference.     Microbiology: No results found for this or any previous visit (from the past 240 hour(s)).   Labs: Basic Metabolic Panel:  Recent Labs Lab 10/02/14 2052 10/04/14 1102 10/05/14 1724 10/06/14 0625 10/07/14 0510  NA 136 135 131* 131* 136  K 4.7 3.6 4.3 4.5 5.4*  CL 99* 98* 102 97* 105  CO2 28 26 24 24 22   GLUCOSE 89 97 119* 111* 110*  BUN 16 16 21* 21* 16  CREATININE 0.93 0.93 1.05* 1.03* 0.78  CALCIUM 9.4 8.9 8.6* 8.5* 8.7*   Liver Function Tests:  Recent Labs Lab 10/02/14 2052 10/06/14 0625  AST 46* 31  ALT 23 26  ALKPHOS 75 70  BILITOT 1.0 0.7  PROT 7.5 7.1  ALBUMIN 3.7 3.4*   CBC:  Recent Labs Lab 10/02/14 2052 10/04/14 1102 10/05/14 1724 10/06/14 0625 10/07/14 0510  WBC 7.1 10.2 7.0 7.4 4.4  NEUTROABS 4.8 7.0 4.6 4.4  --   HGB 11.6* 11.4* 11.3* 10.3* 10.7*  HCT 35.5* 34.9* 33.8* 31.6* 32.4*  MCV 92.7 92.6 92.6 93.5 92.6  PLT 120* 90* 108* 113* 124*   SIGNED: Time coordinating discharge: 30 minutes  MAGICK-Ferdinando Lodge, MD  [facility] 10/07/2014, 11:32 AM Pager [phone]  If 7PM-7AM, please contact night-coverage [email or web address] Password [email or web address]

## 2014-10-07 NOTE — Progress Notes (Signed)
Pt's potassium level this AM 5.4. Dyanne Carrel NP notified and order received for Kayexalate.

## 2014-10-07 NOTE — Discharge Instructions (Signed)

## 2014-10-07 NOTE — Progress Notes (Signed)
Patient discharged to home, discharge instructions reviewed with patient who verbalized understanding. New RX's given to patient. 

## 2015-01-29 ENCOUNTER — Encounter (HOSPITAL_COMMUNITY): Payer: Self-pay

## 2015-01-29 ENCOUNTER — Other Ambulatory Visit: Payer: Self-pay | Admitting: Family Medicine

## 2015-01-29 ENCOUNTER — Ambulatory Visit (INDEPENDENT_AMBULATORY_CARE_PROVIDER_SITE_OTHER): Payer: Medicare Other

## 2015-01-29 ENCOUNTER — Ambulatory Visit (HOSPITAL_COMMUNITY)
Admission: RE | Admit: 2015-01-29 | Discharge: 2015-01-29 | Disposition: A | Discharge status: Home / Self Care | Payer: Medicare HMO | Source: Ambulatory Visit | Attending: Family Medicine | Admitting: Family Medicine

## 2015-01-29 ENCOUNTER — Ambulatory Visit (INDEPENDENT_AMBULATORY_CARE_PROVIDER_SITE_OTHER): Payer: Medicare Other | Admitting: Family Medicine

## 2015-01-29 VITALS — BP 152/88 | HR 110 | Temp 98.0°F | Resp 20 | Ht 66.0 in | Wt 181.0 lb

## 2015-01-29 DIAGNOSIS — R519 Headache, unspecified: Secondary | ICD-10-CM

## 2015-01-29 DIAGNOSIS — M791 Myalgia: Secondary | ICD-10-CM

## 2015-01-29 DIAGNOSIS — S80812A Abrasion, left lower leg, initial encounter: Secondary | ICD-10-CM

## 2015-01-29 DIAGNOSIS — S40811A Abrasion of right upper arm, initial encounter: Secondary | ICD-10-CM

## 2015-01-29 DIAGNOSIS — S0101XA Laceration without foreign body of scalp, initial encounter: Secondary | ICD-10-CM | POA: Insufficient documentation

## 2015-01-29 DIAGNOSIS — M7918 Myalgia, other site: Secondary | ICD-10-CM

## 2015-01-29 DIAGNOSIS — X58XXXA Exposure to other specified factors, initial encounter: Secondary | ICD-10-CM | POA: Diagnosis not present

## 2015-01-29 DIAGNOSIS — Z23 Encounter for immunization: Secondary | ICD-10-CM | POA: Diagnosis not present

## 2015-01-29 DIAGNOSIS — R51 Headache: Secondary | ICD-10-CM | POA: Diagnosis present

## 2015-01-29 DIAGNOSIS — J0100 Acute maxillary sinusitis, unspecified: Secondary | ICD-10-CM

## 2015-01-29 MED ORDER — OXYCODONE-ACETAMINOPHEN 5-325 MG PO TABS: 1.0000 | ORAL_TABLET | ORAL | Status: DC | PRN

## 2015-01-29 MED ORDER — AMOXICILLIN 875 MG PO TABS: 875.0000 mg | ORAL_TABLET | Freq: Two times a day (BID) | ORAL | Status: DC

## 2015-01-29 NOTE — Progress Notes (Signed)
PROCEDURE NOTE: laceration repair Verbal consent obtained from patient. Covidien skin adhesive applied to scalp laceration. Wound cleansed and dressed.  Jaynee Eagles, PA-C [facility] [phone] 01/29/2015  12:57 PM

## 2015-01-29 NOTE — Progress Notes (Addendum)
Patient ID: [name], female   DOB: [birth year], 54 y.o.   MRN: [id number]  This chart was scribed for Robyn Haber, MD by Ladene Artist, ED Scribe. The patient was seen in room 6. Patient's care was started at 12:04 PM.  Patient ID: [name] MRN: [id number], DOB: 1961/01/16, 54 y.o. Date of Encounter: 01/29/2015, 1:03 PM  Primary Physician: [place]  Chief Complaint  Patient presents with  . Fall    hit head / bleeding also states back painful     HPI: 54 y.o. year old female with history below presents with a fall that occurred approximately 1.5 hour ago. 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 She states that the back of her head bounced on the concrete upon impact. She reports a few seconds of LOC. Bleeding is controlled at this time. She reports that she had to rest for approximately 30 minutes prior the incident before returning to work. She reports associated HA, dizziness, tinnitus, neck pain, right shoulder pain, right elbow pain and low back pain. She reports that back pain is exacerbated with movement, sitting and standing. Pt has applied ice to the area with relief. Pt denies abdominal pain or chest pain.   Patient is disabled and was doing some yard work for friend.  Past Medical History  Diagnosis Date  . Chronic pain syndrome   . Cervical radiculopathy   . Osteoarthritis   . GERD (gastroesophageal reflux disease)   . Thrombocytopenia   . Leukocytopenia   . Peptic ulcer disease   . DJD (degenerative joint disease)   . Polysubstance (excluding opioids) dependence   . Anxiety   . Blood transfusion without reported diagnosis   . Osteoporosis   . Bipolar affective disorder     ADHD AND SCHZIOPHRENIC--PT GOES TO [place]. BLANKMAN  . Shortness of breath     WITH EXERTION; SMOKER  . Pneumonia 2013  . H/O hiatal hernia   . Seizures     SEIZURE AFTER BREATHING TX  (ALBUTEROL) IN ER; ALSO PAST HX SEIZURES DURING DRUG DETOX  . Hepatitis C infection     STATES COMPLETED TREATMENT AND NO LONGER HAS INFECTION  . Complication of anesthesia     STATES SHE WOKE UP TWICE DURING SURGERIES  . Hypertension      Home Meds: Prior to Admission medications   Medication Sig Start Date End Date Taking? Authorizing Provider  amphetamine-dextroamphetamine (ADDERALL) 20 MG tablet Take 1 tablet by mouth 3 (three) times daily. 09/29/14  Yes Historical Provider, MD  lisinopril-hydrochlorothiazide (PRINZIDE,ZESTORETIC) 10-12.5 MG per tablet Take 1 tablet by mouth daily. 09/29/14  Yes Historical Provider, MD  oxyCODONE-acetaminophen (PERCOCET/ROXICET) 5-325 MG per tablet Take 1-2 tablets by mouth every 3 (three) hours as needed for moderate pain. 10/07/14  Yes Theodis Blaze, MD  potassium chloride SA (K-DUR,KLOR-CON) 20 MEQ tablet Take 20 mEq by mouth daily. 09/29/14  Yes Historical Provider, MD  QUEtiapine (SEROQUEL) 300 MG tablet Take 600 mg by mouth at bedtime.    Yes Historical Provider, MD  traMADol (ULTRAM) 50 MG tablet Take 50-100 mg by mouth 3 (three) times daily as needed for moderate pain.  09/29/14  Yes Historical Provider, MD  acetaminophen-codeine (TYLENOL #3) 300-30 MG per tablet Take 1-2 tablets by mouth every 4 (four) hours as needed for moderate pain.    Historical Provider, MD  docusate sodium 100 MG CAPS  Take 100 mg by mouth 2 (two) times daily. Patient not taking: Reported on 01/29/2015 08/26/12   Danae Orleans, PA-C  esomeprazole (NEXIUM) 40 MG capsule Take 40 mg by mouth daily at 12 noon.    Historical Provider, MD  ferrous sulfate 325 (65 FE) MG tablet Take 1 tablet (325 mg total) by mouth 3 (three) times daily after meals. Patient not taking: Reported on 10/04/2014 08/26/12   Danae Orleans, PA-C  lisinopril (PRINIVIL,ZESTRIL) 5 MG tablet Take 1 tablet (5 mg total) by mouth daily. Patient not taking: Reported on 01/29/2015 08/14/14   Liam Graham, PA-C    Allergies:   Allergies  Allergen Reactions  . Albuterol Other (See Comments)    PT states she had seizures from an albuterol treatment and was hospitalized for it  . Aspirin Hives    Vomits blood  . Adhesive [Tape] Rash    Social History   Social History  . Marital Status: Divorced    Spouse Name: N/A  . Number of Children: N/A  . Years of Education: N/A   Occupational History  . Not on file.   Social History Main Topics  . Smoking status: Current Some Day Smoker -- 0.50 packs/day for 20 years    Types: Cigarettes  . Smokeless tobacco: Never Used  . Alcohol Use: No     Comment: HX OF ABUSE OF PAIN MEDS--PT IS CURRENTLY IN TREATMENT AT [employer]  . Drug Use: No  . Sexual Activity: Not Currently   Other Topics Concern  . Not on file   Social History Narrative     Review of Systems: Constitutional: negative for chills, fever, night sweats, weight changes, or fatigue  HEENT: negative for vision changes, hearing loss, congestion, rhinorrhea, ST, epistaxis, or sinus pressure, + tinnitus  Cardiovascular: negative for chest pain or palpitations Respiratory: negative for hemoptysis, wheezing, shortness of breath, or cough Abdominal: negative for abdominal pain, nausea, vomiting, diarrhea, or constipation Msk: + neck pain, + back pain, + arthralgias  Dermatological: negative for rash, + wound Neurologic: + headache, + dizziness, + LOC All other systems reviewed and are otherwise negative with the exception to those above and in the HPI.  Physical Exam: Blood pressure 152/88, pulse 110, temperature 98 F (36.7 C), resp. rate 20, height 5\' 6"  (1.676 m), weight 181 lb (82.101 kg), SpO2 98 %., Body mass index is 29.23 kg/(m^2). General: Well developed, well nourished, in no acute distress. Head: Eyes without discharge, sclera non-icteric, nares are without discharge. Bilateral auditory canals clear, TM's are without perforation, pearly grey and translucent  with reflective cone of light bilaterally. Oral cavity moist, posterior pharynx without exudate, erythema, peritonsillar abscess, or post nasal drip. Hair and scalp are matted with blood. 1 cm laceration to midline parietal area.   Neck: Supple. No thyromegaly. Full ROM. No lymphadenopathy.  Lungs: Clear bilaterally to auscultation without wheezes, rales, or rhonchi. Breathing is unlabored. Heart: RRR with S1 S2. No murmurs, rubs, or gallops appreciated. Abdomen: Soft, non-tender, non-distended with normoactive bowel sounds. No hepatomegaly. No rebound/guarding. No obvious abdominal masses. Msk:  Strength and tone normal for age. Tender in the gluteal cleft.  Extremities/Skin: Warm and dry. No clubbing or cyanosis. No edema. No rashes or suspicious lesions. Approximately 5 cm abrasion to L anterior shin. 1 cm R elbow abrasion. Full ROM of all 4 extremities.  Neuro: Alert and oriented X 3. Moves all extremities spontaneously. Gait is normal. CNII-XII grossly in tact. Psych:  Responds  to questions appropriately with a normal affect.   [facility] reading (PRIMARY) by  Dr. Joseph Art:  No acute bony abnormalities of the pelvis, sacrum, or coccyx. Patient's total hip replacements appear to be stable..  ASSESSMENT AND PLAN:  54 y.o. year old female with  1. Gluteal pain   2. Scalp laceration, initial encounter   3. Need for immunization against influenza   4. Abrasion of lower extremity, left, initial encounter   5. Abrasion of upper extremity, right, initial encounter   6. Headache above the eye region     This chart was scribed in my presence and reviewed by me personally.    ICD-9-CM ICD-10-CM   1. Gluteal pain 729.1 M79.1 DG Sacrum/Coccyx     DG Pelvis 1-2 Views     oxyCODONE-acetaminophen (PERCOCET/ROXICET) 5-325 MG per tablet  2. Scalp laceration, initial encounter 873.0 S01.01XA CT Head Wo Contrast  3. Need for immunization against influenza V04.81 Z23 Flu Vaccine QUAD 36+ mos IM  4.  Abrasion of lower extremity, left, initial encounter 916.0 S80.812A oxyCODONE-acetaminophen (PERCOCET/ROXICET) 5-325 MG per tablet  5. Abrasion of upper extremity, right, initial encounter 912.0 S40.811A oxyCODONE-acetaminophen (PERCOCET/ROXICET) 5-325 MG per tablet  6. Headache above the eye region 784.0 R51 CT Head Wo Contrast     oxyCODONE-acetaminophen (PERCOCET/ROXICET) 5-325 MG per tablet    Please see PA note. Dermabond was applied after scalp was cleansed and wound debridement.  Signed, Robyn Haber, MD 01/29/2015 1:03 PM

## 2015-01-29 NOTE — Progress Notes (Signed)
Per Dr. Joseph Art CT head indicated sinus infection, otherwise normal. See result note on CT report. Please notify patient and send in Amoxicillin 875mg  1 po bid #20.   Patient notified and voiced understanding. Amoxicillin sent in to [facility] and [facility]

## 2015-01-29 NOTE — Patient Instructions (Addendum)
Go to [facility] and register at the Emergency Department for OUTPATIENT CT. DO NOT register as ED patient.   We will get a CT scan today to make sure there's been no internal injuries. I will you to stay out of work for the next 48 hours and then come back on Wednesday so we can recheck your wounds and make sure that you're healing properly

## 2015-07-09 ENCOUNTER — Emergency Department (HOSPITAL_COMMUNITY): Payer: Medicare HMO

## 2015-07-09 ENCOUNTER — Emergency Department (HOSPITAL_COMMUNITY)
Admission: EM | Admit: 2015-07-09 | Discharge: 2015-07-09 | Disposition: A | Discharge status: Home / Self Care | Payer: Medicare HMO | Attending: Emergency Medicine | Admitting: Emergency Medicine

## 2015-07-09 ENCOUNTER — Encounter (HOSPITAL_COMMUNITY): Payer: Self-pay | Admitting: Emergency Medicine

## 2015-07-09 DIAGNOSIS — R079 Chest pain, unspecified: Secondary | ICD-10-CM | POA: Insufficient documentation

## 2015-07-09 DIAGNOSIS — R1013 Epigastric pain: Secondary | ICD-10-CM | POA: Insufficient documentation

## 2015-07-09 DIAGNOSIS — G894 Chronic pain syndrome: Secondary | ICD-10-CM | POA: Insufficient documentation

## 2015-07-09 DIAGNOSIS — F17210 Nicotine dependence, cigarettes, uncomplicated: Secondary | ICD-10-CM | POA: Diagnosis not present

## 2015-07-09 DIAGNOSIS — I10 Essential (primary) hypertension: Secondary | ICD-10-CM | POA: Diagnosis not present

## 2015-07-09 MED ORDER — ONDANSETRON 4 MG PO TBDP
4.0000 mg | ORAL_TABLET | Freq: Once | ORAL | Status: AC | PRN
  Administered 2015-07-09: 4 mg via ORAL
  Filled 2015-07-09: qty 1

## 2015-07-09 NOTE — ED Notes (Signed)
Pt c/o mid epigastric and chest pain and N/V/D since 5pm. Alert and oriented.

## 2015-07-09 NOTE — ED Notes (Signed)
Pt called x 3 by Xray and phlebotomy with no answer in the waiting room.

## 2015-09-13 ENCOUNTER — Other Ambulatory Visit: Payer: Self-pay | Admitting: Orthopedic Surgery

## 2015-09-13 DIAGNOSIS — M545 Low back pain: Secondary | ICD-10-CM

## 2015-09-21 ENCOUNTER — Other Ambulatory Visit: Payer: Medicare Other

## 2015-09-28 ENCOUNTER — Ambulatory Visit
Admission: RE | Admit: 2015-09-28 | Discharge: 2015-09-28 | Disposition: A | Discharge status: Home / Self Care | Payer: Medicare Other | Source: Ambulatory Visit | Attending: Orthopedic Surgery | Admitting: Orthopedic Surgery

## 2015-09-28 DIAGNOSIS — M545 Low back pain: Secondary | ICD-10-CM

## 2015-10-06 ENCOUNTER — Other Ambulatory Visit: Payer: Self-pay | Admitting: Physician Assistant

## 2015-10-06 ENCOUNTER — Ambulatory Visit (INDEPENDENT_AMBULATORY_CARE_PROVIDER_SITE_OTHER): Payer: Medicare Other

## 2015-10-06 ENCOUNTER — Ambulatory Visit (INDEPENDENT_AMBULATORY_CARE_PROVIDER_SITE_OTHER): Payer: Medicare Other | Admitting: Physician Assistant

## 2015-10-06 VITALS — BP 138/84 | HR 96 | Temp 98.0°F | Resp 16 | Ht 66.0 in | Wt 174.4 lb

## 2015-10-06 DIAGNOSIS — J209 Acute bronchitis, unspecified: Secondary | ICD-10-CM

## 2015-10-06 DIAGNOSIS — R0602 Shortness of breath: Secondary | ICD-10-CM

## 2015-10-06 DIAGNOSIS — S2231XS Fracture of one rib, right side, sequela: Secondary | ICD-10-CM

## 2015-10-06 DIAGNOSIS — S2231XD Fracture of one rib, right side, subsequent encounter for fracture with routine healing: Secondary | ICD-10-CM | POA: Diagnosis not present

## 2015-10-06 MED ORDER — DOXYCYCLINE HYCLATE 100 MG PO CAPS: 100.0000 mg | ORAL_CAPSULE | Freq: Two times a day (BID) | ORAL | Status: AC

## 2015-10-06 MED ORDER — DICLOFENAC SODIUM 75 MG PO TBEC: 75.0000 mg | DELAYED_RELEASE_TABLET | Freq: Two times a day (BID) | ORAL | Status: DC

## 2015-10-06 NOTE — Patient Instructions (Addendum)
zanaflex at night Diclofenac twice a day for pain Deep breathing exercises as discussed Heating pad Follow up with pain clinic.    IF you received an x-ray today, you will receive an invoice from [facility]. Please contact [facility] at [phone] with questions or concerns regarding your invoice.   IF you received labwork today, you will receive an invoice from [employer]. Please contact [facility] at [phone] with questions or concerns regarding your invoice.   Our billing staff will not be able to assist you with questions regarding bills from these companies.  You will be contacted with the lab results as soon as they are available. The fastest way to get your results is to activate your My Chart account. Instructions are located on the last page of this paperwork. If you have not heard from [place] regarding the results in 2 weeks, please contact this office.
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
## 2015-10-18 ENCOUNTER — Other Ambulatory Visit: Payer: Self-pay | Admitting: Physician Assistant

## 2016-04-13 ENCOUNTER — Encounter (HOSPITAL_COMMUNITY): Payer: Self-pay | Admitting: Emergency Medicine

## 2016-04-13 ENCOUNTER — Inpatient Hospital Stay (HOSPITAL_COMMUNITY)
Admission: EM | Admit: 2016-04-13 | Discharge: 2016-04-19 | DRG: 579 | Disposition: A | Discharge status: Home / Self Care | Payer: Medicare Other | Attending: Internal Medicine | Admitting: Internal Medicine

## 2016-04-13 DIAGNOSIS — I10 Essential (primary) hypertension: Secondary | ICD-10-CM | POA: Diagnosis present

## 2016-04-13 DIAGNOSIS — M1990 Unspecified osteoarthritis, unspecified site: Secondary | ICD-10-CM | POA: Diagnosis present

## 2016-04-13 DIAGNOSIS — K766 Portal hypertension: Secondary | ICD-10-CM | POA: Diagnosis present

## 2016-04-13 DIAGNOSIS — F909 Attention-deficit hyperactivity disorder, unspecified type: Secondary | ICD-10-CM | POA: Diagnosis present

## 2016-04-13 DIAGNOSIS — K7460 Unspecified cirrhosis of liver: Secondary | ICD-10-CM

## 2016-04-13 DIAGNOSIS — L03113 Cellulitis of right upper limb: Secondary | ICD-10-CM | POA: Diagnosis not present

## 2016-04-13 DIAGNOSIS — Z96643 Presence of artificial hip joint, bilateral: Secondary | ICD-10-CM | POA: Diagnosis present

## 2016-04-13 DIAGNOSIS — B182 Chronic viral hepatitis C: Secondary | ICD-10-CM | POA: Diagnosis present

## 2016-04-13 DIAGNOSIS — R161 Splenomegaly, not elsewhere classified: Secondary | ICD-10-CM | POA: Diagnosis present

## 2016-04-13 DIAGNOSIS — D62 Acute posthemorrhagic anemia: Secondary | ICD-10-CM | POA: Diagnosis not present

## 2016-04-13 DIAGNOSIS — Z8711 Personal history of peptic ulcer disease: Secondary | ICD-10-CM

## 2016-04-13 DIAGNOSIS — Z9049 Acquired absence of other specified parts of digestive tract: Secondary | ICD-10-CM

## 2016-04-13 DIAGNOSIS — R7881 Bacteremia: Secondary | ICD-10-CM | POA: Diagnosis present

## 2016-04-13 DIAGNOSIS — K922 Gastrointestinal hemorrhage, unspecified: Secondary | ICD-10-CM

## 2016-04-13 DIAGNOSIS — L02419 Cutaneous abscess of limb, unspecified: Secondary | ICD-10-CM

## 2016-04-13 DIAGNOSIS — J189 Pneumonia, unspecified organism: Secondary | ICD-10-CM

## 2016-04-13 DIAGNOSIS — F1410 Cocaine abuse, uncomplicated: Secondary | ICD-10-CM | POA: Diagnosis present

## 2016-04-13 DIAGNOSIS — K254 Chronic or unspecified gastric ulcer with hemorrhage: Secondary | ICD-10-CM | POA: Diagnosis not present

## 2016-04-13 DIAGNOSIS — B9562 Methicillin resistant Staphylococcus aureus infection as the cause of diseases classified elsewhere: Secondary | ICD-10-CM | POA: Diagnosis present

## 2016-04-13 DIAGNOSIS — F319 Bipolar disorder, unspecified: Secondary | ICD-10-CM

## 2016-04-13 DIAGNOSIS — F1910 Other psychoactive substance abuse, uncomplicated: Secondary | ICD-10-CM

## 2016-04-13 DIAGNOSIS — D6959 Other secondary thrombocytopenia: Secondary | ICD-10-CM | POA: Diagnosis present

## 2016-04-13 DIAGNOSIS — Z91040 Latex allergy status: Secondary | ICD-10-CM

## 2016-04-13 DIAGNOSIS — K210 Gastro-esophageal reflux disease with esophagitis: Secondary | ICD-10-CM | POA: Diagnosis present

## 2016-04-13 DIAGNOSIS — Z888 Allergy status to other drugs, medicaments and biological substances status: Secondary | ICD-10-CM

## 2016-04-13 DIAGNOSIS — J181 Lobar pneumonia, unspecified organism: Secondary | ICD-10-CM

## 2016-04-13 DIAGNOSIS — Z91048 Other nonmedicinal substance allergy status: Secondary | ICD-10-CM

## 2016-04-13 DIAGNOSIS — F17210 Nicotine dependence, cigarettes, uncomplicated: Secondary | ICD-10-CM | POA: Diagnosis present

## 2016-04-13 DIAGNOSIS — M79641 Pain in right hand: Secondary | ICD-10-CM | POA: Diagnosis not present

## 2016-04-13 DIAGNOSIS — K269 Duodenal ulcer, unspecified as acute or chronic, without hemorrhage or perforation: Secondary | ICD-10-CM | POA: Diagnosis present

## 2016-04-13 DIAGNOSIS — K7469 Other cirrhosis of liver: Secondary | ICD-10-CM | POA: Diagnosis present

## 2016-04-13 DIAGNOSIS — F419 Anxiety disorder, unspecified: Secondary | ICD-10-CM | POA: Diagnosis present

## 2016-04-13 DIAGNOSIS — K3189 Other diseases of stomach and duodenum: Secondary | ICD-10-CM | POA: Diagnosis present

## 2016-04-13 DIAGNOSIS — Z791 Long term (current) use of non-steroidal anti-inflammatories (NSAID): Secondary | ICD-10-CM

## 2016-04-13 DIAGNOSIS — L02511 Cutaneous abscess of right hand: Secondary | ICD-10-CM | POA: Diagnosis present

## 2016-04-13 DIAGNOSIS — Z8614 Personal history of Methicillin resistant Staphylococcus aureus infection: Secondary | ICD-10-CM

## 2016-04-13 DIAGNOSIS — J15212 Pneumonia due to Methicillin resistant Staphylococcus aureus: Secondary | ICD-10-CM | POA: Diagnosis present

## 2016-04-13 DIAGNOSIS — F1510 Other stimulant abuse, uncomplicated: Secondary | ICD-10-CM | POA: Diagnosis present

## 2016-04-13 NOTE — ED Triage Notes (Signed)
Pt from home with complaints of left arm swelling that she noticed yesterday. Pt states she had flea bites on her hands that she picked at. Pt states there was an abscess that she popped with a pin. Pt has swelling in that hand and arm along with redness. Pt also has complaints of rib cage on her lower right side following coughing "really hard yesterday".When checking in, pt stated that she is a cancer patient, but pt had liver cancer and "has been in remission for years".

## 2016-04-13 NOTE — ED Provider Notes (Signed)
[facility] DEPT Provider Note   CSN: [id number] Arrival date & time: 04/13/16  2343  By signing my name below, I, [name], attest that this documentation has been prepared under the direction and in the presence of physician practitioner, Everado Pillsbury, MD. Electronically Signed: Dora [name], Scribe. 04/14/2016. 12:08 AM.  History   Chief Complaint Chief Complaint  Patient presents with  . Cellulitis    The history is provided by the patient. No language interpreter was used.  Hand Pain  This is a new problem. The current episode started yesterday. The problem occurs constantly. The problem has not changed since onset.Pertinent negatives include no chest pain. Nothing aggravates the symptoms. Nothing relieves the symptoms. She has tried nothing for the symptoms. The treatment provided no relief.    HPI Comments: [name] is a 55 y.o. female who presents to the Emergency Department complaining of cellulitis of her bilateral hands and lower legs beginning yesterday. Patient believes the cellulitis is due to bug bites and states she gets cellulitis when she is exposed to fleas. She notes redness and pain to her hands and lower extremities. She states her most severe pain is in her right hand and it radiates down her right forearm. She notes swelling of her right hand and right wrist. Tetanus status UTD. She notes allergies to aspirin (stomach bleed) and Latex. She denies numbness, nausea, vomiting, wounds, or any other associated symptoms.  Past Medical History:  Diagnosis Date  . Anxiety   . Bipolar affective disorder ([place])    ADHD AND SCHZIOPHRENIC--PT GOES TO [place]. [facility]  . Blood transfusion without reported diagnosis   . Cervical radiculopathy   . Chronic pain syndrome   . Complication of anesthesia    STATES SHE WOKE UP TWICE DURING SURGERIES  . DJD (degenerative joint disease)   . GERD (gastroesophageal reflux disease)   . H/O hiatal  hernia   . Hepatitis C infection    STATES COMPLETED TREATMENT AND NO LONGER HAS INFECTION  . Hypertension   . Leukocytopenia   . Osteoarthritis   . Osteoporosis   . Peptic ulcer disease   . Pneumonia 2013  . Polysubstance (excluding opioids) dependence ([place])   . Seizures ([facility])    SEIZURE AFTER BREATHING TX (ALBUTEROL) IN ER; ALSO PAST HX SEIZURES DURING DRUG DETOX  . Shortness of breath    WITH EXERTION; SMOKER  . Thrombocytopenia [facility])     Patient Active Problem List   Diagnosis Date Noted  . Cellulitis of right forearm 10/05/2014  . ARF (acute renal failure) ([place]) 10/05/2014  . History of bipolar disorder 10/05/2014  . History of cirrhosis 10/05/2014  . Essential hypertension 10/05/2014  . S/P left THA, AA 08/25/2012  . Expected blood loss anemia 08/25/2012  . Obesity (BMI 30-39.9) 08/25/2012  . Pancytopenia 09/08/2011  . Hyponatremia 09/04/2011  . Hypokalemia 09/04/2011  . Pneumonia 09/04/2011  . Anemia 09/04/2011  . Fatigue 09/03/2011  . Peptic ulcer disease   . Thrombocytopenia ([place])   . Leukocytopenia   . Hepatitis C infection     Past Surgical History:  Procedure Laterality Date  . ABDOMINAL HYSTERECTOMY    . APPENDECTOMY    . CHOLECYSTECTOMY    . Hyperectomy     due to obstructive ovarian cytst  . JOINT REPLACEMENT  2012   RIGHT TOTAL HIP REPLACEMENT  . Right knee arthroscopic surgery    . SPINE SURGERY     CERVIAL 6-7 SURGERY - FUSION  .  TONSILECTOMY, ADENOIDECTOMY, BILATERAL MYRINGOTOMY AND TUBES    . TOTAL HIP ARTHROPLASTY Left 08/24/2012   Procedure: LEFT TOTAL HIP ARTHROPLASTY ANTERIOR APPROACH;  Surgeon: [name], MD;  Location: [facility];  Service: Orthopedics;  Laterality: Left;  . Total hip replacement      OB History    No data available       Home Medications    Prior to Admission medications   Medication Sig Start Date End Date Taking? Authorizing Provider  amphetamine-dextroamphetamine (ADDERALL) 20 MG tablet Take 1 tablet by  mouth 3 (three) times daily. 09/29/14   Historical Provider, MD  diclofenac (VOLTAREN) 75 MG EC tablet Take 1 tablet (75 mg total) by mouth 2 (two) times daily. 10/06/15   Ezekiel Slocumb, PA-C  esomeprazole (NEXIUM) 40 MG capsule Take 40 mg by mouth daily at 12 noon. Reported on 10/06/2015    Historical Provider, MD  lisinopril (PRINIVIL,ZESTRIL) 5 MG tablet Take 1 tablet (5 mg total) by mouth daily. 08/14/14   Liam Graham, PA-C  lisinopril-hydrochlorothiazide (PRINZIDE,ZESTORETIC) 10-12.5 MG per tablet Take 1 tablet by mouth daily. Reported on 10/06/2015 09/29/14   Historical Provider, MD  potassium chloride SA (K-DUR,KLOR-CON) 20 MEQ tablet Take 20 mEq by mouth daily. 09/29/14   Historical Provider, MD  QUEtiapine (SEROQUEL) 300 MG tablet Take 600 mg by mouth at bedtime.     Historical Provider, MD    Family History Family History  Problem Relation Age of Onset  . Breast cancer Sister   . Cancer Sister   . Heart disease Mother   . Heart disease Father     Social History Social History  Substance Use Topics  . Smoking status: Current Some Day Smoker    Packs/day: 0.50    Years: 20.00    Types: Cigarettes  . Smokeless tobacco: Never Used  . Alcohol use No     Comment: HX OF ABUSE OF PAIN MEDS--PT IS CURRENTLY IN TREATMENT AT [facility] IN [place]     Allergies   Albuterol; Aspirin; and Adhesive [tape]   Review of Systems Review of Systems  Constitutional: Negative for chills, diaphoresis and fever.  Cardiovascular: Negative for chest pain.  Gastrointestinal: Negative for nausea and vomiting.  Musculoskeletal: Positive for arthralgias, joint swelling and myalgias.  Skin: Positive for color change. Negative for wound.  Neurological: Negative for numbness.  All other systems reviewed and are negative.    Physical Exam Updated Vital Signs BP 147/85 (BP Location: Left Arm)   Pulse (!) 130   Temp 98.6 F (37 C) (Oral)   Resp 16   Ht 5\' 7"  (1.702 m)   Wt  160 lb (72.6 kg)   SpO2 96%   BMI 25.06 kg/m   Physical Exam  Constitutional: She appears well-developed and well-nourished. No distress.  HENT:  Head: Normocephalic.  Mouth/Throat: Oropharynx is clear and moist. No oropharyngeal exudate.  Intact phonation  Eyes: Conjunctivae and EOM are normal. Pupils are equal, round, and reactive to light. Right eye exhibits no discharge. Left eye exhibits no discharge. No scleral icterus.  Neck: Normal range of motion. Neck supple. No JVD present. No tracheal deviation present.  Trachea is midline. No stridor or carotid bruits.   Cardiovascular: Normal rate, regular rhythm, normal heart sounds and intact distal pulses.   No murmur heard. Pulmonary/Chest: Effort normal and breath sounds normal. No stridor. No respiratory distress. She has no wheezes. She has no rales.  Lungs CTA bilaterally.  Abdominal: Soft. Bowel sounds  are normal. She exhibits no distension. There is no tenderness. There is no rebound and no guarding.  Musculoskeletal: Normal range of motion. She exhibits no edema or tenderness.  All compartments are soft. No palpable cords.  Intact radial pulse. Right upper extremity: swelling over the distal thenar eminence. Pain radiating from right hand down right forearm and onto volar aspect, including all 5 knuckles. Marked with surgical marking pen.  Lymphadenopathy:    She has no cervical adenopathy.  Neurological: She is alert. She has normal reflexes. She displays normal reflexes. She exhibits normal muscle tone.  Skin: Skin is warm and dry. Capillary refill takes less than 2 seconds. There is erythema.  Psychiatric: She has a normal mood and affect. Her behavior is normal.  Nursing note and vitals reviewed.    ED Treatments / Results   Vitals:   04/13/16 2354 04/14/16 0229  BP: 147/85 125/69  Pulse: (!) 130 120  Resp: 16 18  Temp: 98.6 F (37 C)    Results for orders placed or performed during the hospital encounter of  04/13/16  CBC with Differential/Platelet  Result Value Ref Range   WBC 11.5 (H) 4.0 - 10.5 K/uL   RBC 4.21 3.87 - 5.11 MIL/uL   Hemoglobin 13.0 12.0 - 15.0 g/dL   HCT 38.4 36.0 - 46.0 %   MCV 91.2 78.0 - 100.0 fL   MCH 30.9 26.0 - 34.0 pg   MCHC 33.9 30.0 - 36.0 g/dL   RDW 14.1 11.5 - 15.5 %   Platelets 114 (L) 150 - 400 K/uL   Neutrophils Relative % 79 %   Neutro Abs 9.1 (H) 1.7 - 7.7 K/uL   Lymphocytes Relative 11 %   Lymphs Abs 1.3 0.7 - 4.0 K/uL   Monocytes Relative 9 %   Monocytes Absolute 1.1 (H) 0.1 - 1.0 K/uL   Eosinophils Relative 0 %   Eosinophils Absolute 0.0 0.0 - 0.7 K/uL   Basophils Relative 0 %   Basophils Absolute 0.0 0.0 - 0.1 K/uL  I-Stat Chem 8, ED  Result Value Ref Range   Sodium 134 (L) 135 - 145 mmol/L   Potassium 3.1 (L) 3.5 - 5.1 mmol/L   Chloride 97 (L) 101 - 111 mmol/L   BUN 9 6 - 20 mg/dL   Creatinine, Ser 0.70 0.44 - 1.00 mg/dL   Glucose, Bld 141 (H) 65 - 99 mg/dL   Calcium, Ion 1.11 (L) 1.15 - 1.40 mmol/L   TCO2 24 0 - 100 mmol/L   Hemoglobin 12.2 12.0 - 15.0 g/dL   HCT 36.0 36.0 - 46.0 %   Dg Chest 2 View  Result Date: 04/14/2016 CLINICAL DATA:  Left arm swelling since yesterday. The irritated sleeve bites on the hands. Skin abscess. Swelling in the hand and arm. Redness. Right lower rib cage pain after coughing yesterday. EXAM: CHEST  2 VIEW COMPARISON:  10/06/2015 FINDINGS: There is focal area of increased density in the right lung base laterally which may indicate focal pneumonia. Left lung is clear. Normal heart size and pulmonary vascularity. No blunting of costophrenic angles. No pneumothorax. Mediastinal contours appear intact. Calcified aorta. Esophageal hiatal hernia behind the heart. Postoperative changes in the cervical spine. IMPRESSION: Small focal area of infiltration in the right lung base may indicate focal pneumonia. Electronically Signed   By: Lucienne Capers M.D.   On: 04/14/2016 00:59   Dg Hand Complete Right  Result Date:  04/14/2016 CLINICAL DATA:  Left arm swelling.  Reported flea bites. EXAM:  RIGHT HAND - COMPLETE 3+ VIEW COMPARISON:  None. FINDINGS: There is no evidence of fracture or dislocation. There is no evidence of arthropathy or other focal bone abnormality. Soft tissues are unremarkable. IMPRESSION: No focal osseous abnormality. Electronically Signed   By: Ulyses Jarred M.D.   On: 04/14/2016 00:59     Procedures Procedures (including critical care time)  DIAGNOSTIC STUDIES: Oxygen Saturation is 96% on RA, adequate by my interpretation.    COORDINATION OF CARE: 12:14 AM Discussed treatment plan with pt at bedside and pt agreed to plan.  Medications Ordered in ED  Medications  lidocaine (LIDODERM) 5 % 1 patch (1 patch Transdermal Patch Applied 04/14/16 0155)  potassium chloride SA (K-DUR,KLOR-CON) CR tablet 40 mEq (not administered)  sodium chloride 0.9 % bolus 1,000 mL (0 mLs Intravenous Stopped 04/14/16 0305)  ketorolac (TORADOL) 30 MG/ML injection 30 mg (30 mg Intravenous Given 04/14/16 0150)  vancomycin (VANCOCIN) IVPB 1000 mg/200 mL premix (0 mg Intravenous Stopped 04/14/16 0253)  piperacillin-tazobactam (ZOSYN) IVPB 3.375 g (0 g Intravenous Stopped 04/14/16 0221)    Case d/w Dr. Fredna Dow keep NPO [facility] at [facility] to go to the OR  Final Clinical Impressions(s) / ED Diagnoses  Cellulitis likely secondary to skin popping from IVDA.  I do not believe these are flea bites as the patient claims.     Veatrice Kells, MD 04/14/16 [phone]

## 2016-04-14 ENCOUNTER — Emergency Department (HOSPITAL_COMMUNITY): Payer: Medicare Other

## 2016-04-14 ENCOUNTER — Encounter (HOSPITAL_COMMUNITY): Payer: Self-pay | Admitting: Emergency Medicine

## 2016-04-14 ENCOUNTER — Inpatient Hospital Stay (HOSPITAL_COMMUNITY): Payer: Medicare Other | Admitting: Certified Registered"

## 2016-04-14 ENCOUNTER — Encounter (HOSPITAL_COMMUNITY)
Admission: EM | Disposition: A | Discharge status: Home / Self Care | Payer: Self-pay | Source: Home / Self Care | Attending: Internal Medicine

## 2016-04-14 DIAGNOSIS — K210 Gastro-esophageal reflux disease with esophagitis: Secondary | ICD-10-CM | POA: Diagnosis present

## 2016-04-14 DIAGNOSIS — I10 Essential (primary) hypertension: Secondary | ICD-10-CM | POA: Diagnosis present

## 2016-04-14 DIAGNOSIS — L03113 Cellulitis of right upper limb: Secondary | ICD-10-CM | POA: Diagnosis present

## 2016-04-14 DIAGNOSIS — F909 Attention-deficit hyperactivity disorder, unspecified type: Secondary | ICD-10-CM | POA: Diagnosis present

## 2016-04-14 DIAGNOSIS — K922 Gastrointestinal hemorrhage, unspecified: Secondary | ICD-10-CM | POA: Diagnosis not present

## 2016-04-14 DIAGNOSIS — F1510 Other stimulant abuse, uncomplicated: Secondary | ICD-10-CM | POA: Diagnosis present

## 2016-04-14 DIAGNOSIS — M1990 Unspecified osteoarthritis, unspecified site: Secondary | ICD-10-CM | POA: Diagnosis present

## 2016-04-14 DIAGNOSIS — R161 Splenomegaly, not elsewhere classified: Secondary | ICD-10-CM | POA: Diagnosis present

## 2016-04-14 DIAGNOSIS — K7469 Other cirrhosis of liver: Secondary | ICD-10-CM | POA: Diagnosis present

## 2016-04-14 DIAGNOSIS — B182 Chronic viral hepatitis C: Secondary | ICD-10-CM | POA: Diagnosis present

## 2016-04-14 DIAGNOSIS — D696 Thrombocytopenia, unspecified: Secondary | ICD-10-CM | POA: Diagnosis not present

## 2016-04-14 DIAGNOSIS — K3189 Other diseases of stomach and duodenum: Secondary | ICD-10-CM | POA: Diagnosis present

## 2016-04-14 DIAGNOSIS — D6959 Other secondary thrombocytopenia: Secondary | ICD-10-CM | POA: Diagnosis present

## 2016-04-14 DIAGNOSIS — M79641 Pain in right hand: Secondary | ICD-10-CM | POA: Diagnosis present

## 2016-04-14 DIAGNOSIS — L02511 Cutaneous abscess of right hand: Secondary | ICD-10-CM | POA: Diagnosis present

## 2016-04-14 DIAGNOSIS — K254 Chronic or unspecified gastric ulcer with hemorrhage: Secondary | ICD-10-CM | POA: Diagnosis not present

## 2016-04-14 DIAGNOSIS — D62 Acute posthemorrhagic anemia: Secondary | ICD-10-CM | POA: Diagnosis not present

## 2016-04-14 DIAGNOSIS — F17210 Nicotine dependence, cigarettes, uncomplicated: Secondary | ICD-10-CM | POA: Diagnosis present

## 2016-04-14 DIAGNOSIS — J15212 Pneumonia due to Methicillin resistant Staphylococcus aureus: Secondary | ICD-10-CM | POA: Diagnosis present

## 2016-04-14 DIAGNOSIS — L02419 Cutaneous abscess of limb, unspecified: Secondary | ICD-10-CM | POA: Diagnosis not present

## 2016-04-14 DIAGNOSIS — J181 Lobar pneumonia, unspecified organism: Secondary | ICD-10-CM | POA: Diagnosis not present

## 2016-04-14 DIAGNOSIS — Z96643 Presence of artificial hip joint, bilateral: Secondary | ICD-10-CM | POA: Diagnosis present

## 2016-04-14 DIAGNOSIS — J189 Pneumonia, unspecified organism: Secondary | ICD-10-CM | POA: Diagnosis not present

## 2016-04-14 DIAGNOSIS — R7881 Bacteremia: Secondary | ICD-10-CM | POA: Diagnosis present

## 2016-04-14 DIAGNOSIS — F319 Bipolar disorder, unspecified: Secondary | ICD-10-CM | POA: Diagnosis present

## 2016-04-14 DIAGNOSIS — F419 Anxiety disorder, unspecified: Secondary | ICD-10-CM | POA: Diagnosis present

## 2016-04-14 DIAGNOSIS — F1410 Cocaine abuse, uncomplicated: Secondary | ICD-10-CM | POA: Diagnosis present

## 2016-04-14 DIAGNOSIS — K766 Portal hypertension: Secondary | ICD-10-CM | POA: Diagnosis present

## 2016-04-14 DIAGNOSIS — K7460 Unspecified cirrhosis of liver: Secondary | ICD-10-CM | POA: Diagnosis not present

## 2016-04-14 DIAGNOSIS — B9562 Methicillin resistant Staphylococcus aureus infection as the cause of diseases classified elsewhere: Secondary | ICD-10-CM | POA: Diagnosis present

## 2016-04-14 DIAGNOSIS — K269 Duodenal ulcer, unspecified as acute or chronic, without hemorrhage or perforation: Secondary | ICD-10-CM | POA: Diagnosis present

## 2016-04-14 DIAGNOSIS — F1990 Other psychoactive substance use, unspecified, uncomplicated: Secondary | ICD-10-CM | POA: Diagnosis not present

## 2016-04-14 HISTORY — PX: I&D EXTREMITY: SHX5045

## 2016-04-14 LAB — CBC WITH DIFFERENTIAL/PLATELET
BASOS PCT: 0 %
Basophils Absolute: 0 10*3/uL (ref 0.0–0.1)
EOS ABS: 0 10*3/uL (ref 0.0–0.7)
EOS PCT: 0 %
HCT: 38.4 % (ref 36.0–46.0)
Hemoglobin: 13 g/dL (ref 12.0–15.0)
Lymphocytes Relative: 11 %
Lymphs Abs: 1.3 10*3/uL (ref 0.7–4.0)
MCH: 30.9 pg (ref 26.0–34.0)
MCHC: 33.9 g/dL (ref 30.0–36.0)
MCV: 91.2 fL (ref 78.0–100.0)
MONO ABS: 1.1 10*3/uL — AB (ref 0.1–1.0)
MONOS PCT: 9 %
Neutro Abs: 9.1 10*3/uL — ABNORMAL HIGH (ref 1.7–7.7)
Neutrophils Relative %: 79 %
Platelets: 114 10*3/uL — ABNORMAL LOW (ref 150–400)
RBC: 4.21 MIL/uL (ref 3.87–5.11)
RDW: 14.1 % (ref 11.5–15.5)
WBC: 11.5 10*3/uL — ABNORMAL HIGH (ref 4.0–10.5)

## 2016-04-14 LAB — I-STAT CHEM 8, ED
BUN: 9 mg/dL (ref 6–20)
CREATININE: 0.7 mg/dL (ref 0.44–1.00)
Calcium, Ion: 1.11 mmol/L — ABNORMAL LOW (ref 1.15–1.40)
Chloride: 97 mmol/L — ABNORMAL LOW (ref 101–111)
Glucose, Bld: 141 mg/dL — ABNORMAL HIGH (ref 65–99)
HEMATOCRIT: 36 % (ref 36.0–46.0)
HEMOGLOBIN: 12.2 g/dL (ref 12.0–15.0)
Potassium: 3.1 mmol/L — ABNORMAL LOW (ref 3.5–5.1)
SODIUM: 134 mmol/L — AB (ref 135–145)
TCO2: 24 mmol/L (ref 0–100)

## 2016-04-14 LAB — CREATININE, SERUM
Creatinine, Ser: 0.96 mg/dL (ref 0.44–1.00)
GFR calc Af Amer: >60 mL/min (ref >60)
GFR calc non Af Amer: >60 mL/min (ref >60)

## 2016-04-14 LAB — CBC
HEMATOCRIT: 33.2 % — AB (ref 36.0–46.0)
Hemoglobin: 11.2 g/dL — ABNORMAL LOW (ref 12.0–15.0)
MCH: 30.5 pg (ref 26.0–34.0)
MCHC: 33.7 g/dL (ref 30.0–36.0)
MCV: 90.5 fL (ref 78.0–100.0)
Platelets: 93 10*3/uL — ABNORMAL LOW (ref 150–400)
RBC: 3.67 MIL/uL — ABNORMAL LOW (ref 3.87–5.11)
RDW: 14 % (ref 11.5–15.5)
WBC: 7.9 10*3/uL (ref 4.0–10.5)

## 2016-04-14 LAB — RAPID URINE DRUG SCREEN, HOSP PERFORMED
Amphetamines: POSITIVE — AB
BENZODIAZEPINES: NOT DETECTED
Barbiturates: NOT DETECTED
COCAINE: POSITIVE — AB
OPIATES: NOT DETECTED
Tetrahydrocannabinol: NOT DETECTED

## 2016-04-14 SURGERY — IRRIGATION AND DEBRIDEMENT EXTREMITY
Anesthesia: General | Site: Arm Lower | Laterality: Right

## 2016-04-14 MED ORDER — PROPOFOL 10 MG/ML IV BOLUS
INTRAVENOUS | Status: DC | PRN
  Administered 2016-04-14: 150 mg via INTRAVENOUS

## 2016-04-14 MED ORDER — LIDOCAINE 2% (20 MG/ML) 5 ML SYRINGE
INTRAMUSCULAR | Status: AC
  Filled 2016-04-14: qty 5

## 2016-04-14 MED ORDER — VANCOMYCIN HCL IN DEXTROSE 750-5 MG/150ML-% IV SOLN
750.0000 mg | Freq: Two times a day (BID) | INTRAVENOUS | Status: DC
  Administered 2016-04-14 – 2016-04-15 (×2): 750 mg via INTRAVENOUS
  Filled 2016-04-14 (×3): qty 150

## 2016-04-14 MED ORDER — DIPHENHYDRAMINE HCL 25 MG PO CAPS: 25.0000 mg | ORAL_CAPSULE | Freq: Four times a day (QID) | ORAL | Status: DC | PRN

## 2016-04-14 MED ORDER — BUPIVACAINE HCL (PF) 0.25 % IJ SOLN
INTRAMUSCULAR | Status: DC | PRN
  Administered 2016-04-14: 9 mL

## 2016-04-14 MED ORDER — METHOCARBAMOL 1000 MG/10ML IJ SOLN
500.0000 mg | Freq: Four times a day (QID) | INTRAMUSCULAR | Status: DC | PRN
  Filled 2016-04-14: qty 5

## 2016-04-14 MED ORDER — PIPERACILLIN-TAZOBACTAM 3.375 G IVPB 30 MIN
3.3750 g | Freq: Once | INTRAVENOUS | Status: AC
  Administered 2016-04-14: 3.375 g via INTRAVENOUS
  Filled 2016-04-14: qty 50

## 2016-04-14 MED ORDER — KETOROLAC TROMETHAMINE 30 MG/ML IJ SOLN
30.0000 mg | Freq: Four times a day (QID) | INTRAMUSCULAR | Status: DC | PRN
  Administered 2016-04-14 – 2016-04-16 (×4): 30 mg via INTRAVENOUS
  Filled 2016-04-14 (×3): qty 1

## 2016-04-14 MED ORDER — HYDROMORPHONE HCL 1 MG/ML IJ SOLN
0.5000 mg | INTRAMUSCULAR | Status: DC | PRN
  Administered 2016-04-14: 0.5 mg via INTRAVENOUS

## 2016-04-14 MED ORDER — PIPERACILLIN-TAZOBACTAM 3.375 G IVPB
3.3750 g | Freq: Three times a day (TID) | INTRAVENOUS | Status: DC
  Administered 2016-04-14 – 2016-04-18 (×14): 3.375 g via INTRAVENOUS
  Filled 2016-04-14 (×16): qty 50

## 2016-04-14 MED ORDER — LIDOCAINE 5 % EX PTCH
1.0000 | MEDICATED_PATCH | CUTANEOUS | Status: DC
  Administered 2016-04-14 – 2016-04-18 (×5): 1 via TRANSDERMAL
  Filled 2016-04-14 (×6): qty 1

## 2016-04-14 MED ORDER — SODIUM CHLORIDE 0.9 % IV BOLUS (SEPSIS)
1000.0000 mL | Freq: Once | INTRAVENOUS | Status: AC
  Administered 2016-04-14: 1000 mL via INTRAVENOUS

## 2016-04-14 MED ORDER — MORPHINE SULFATE (PF) 2 MG/ML IV SOLN
1.0000 mg | INTRAVENOUS | Status: DC | PRN
  Administered 2016-04-15 – 2016-04-17 (×13): 1 mg via INTRAVENOUS
  Filled 2016-04-14 (×13): qty 1

## 2016-04-14 MED ORDER — POTASSIUM CHLORIDE CRYS ER 20 MEQ PO TBCR
40.0000 meq | EXTENDED_RELEASE_TABLET | Freq: Once | ORAL | Status: AC
  Administered 2016-04-14: 40 meq via ORAL
  Filled 2016-04-14: qty 2

## 2016-04-14 MED ORDER — SUCCINYLCHOLINE CHLORIDE 200 MG/10ML IV SOSY
PREFILLED_SYRINGE | INTRAVENOUS | Status: AC
  Filled 2016-04-14: qty 10

## 2016-04-14 MED ORDER — FENTANYL CITRATE (PF) 100 MCG/2ML IJ SOLN
INTRAMUSCULAR | Status: DC | PRN
  Administered 2016-04-14: 50 ug via INTRAVENOUS
  Administered 2016-04-14: 100 ug via INTRAVENOUS
  Administered 2016-04-14: 50 ug via INTRAVENOUS

## 2016-04-14 MED ORDER — LIDOCAINE HCL (CARDIAC) 20 MG/ML IV SOLN
INTRAVENOUS | Status: DC | PRN
  Administered 2016-04-14: 60 mg via INTRAVENOUS

## 2016-04-14 MED ORDER — HYDROCODONE-ACETAMINOPHEN 5-325 MG PO TABS
1.0000 | ORAL_TABLET | ORAL | Status: DC | PRN
  Administered 2016-04-14 – 2016-04-16 (×12): 2 via ORAL
  Filled 2016-04-14 (×11): qty 2

## 2016-04-14 MED ORDER — BUPIVACAINE HCL (PF) 0.25 % IJ SOLN
INTRAMUSCULAR | Status: AC
  Filled 2016-04-14: qty 30

## 2016-04-14 MED ORDER — KETOROLAC TROMETHAMINE 30 MG/ML IJ SOLN
INTRAMUSCULAR | Status: AC
  Filled 2016-04-14: qty 1

## 2016-04-14 MED ORDER — MIDAZOLAM HCL 5 MG/5ML IJ SOLN
INTRAMUSCULAR | Status: DC | PRN
  Administered 2016-04-14: 2 mg via INTRAVENOUS

## 2016-04-14 MED ORDER — ONDANSETRON HCL 4 MG/2ML IJ SOLN
INTRAMUSCULAR | Status: DC | PRN
  Administered 2016-04-14: 4 mg via INTRAVENOUS

## 2016-04-14 MED ORDER — ONDANSETRON HCL 4 MG/2ML IJ SOLN
4.0000 mg | Freq: Four times a day (QID) | INTRAMUSCULAR | Status: DC | PRN
  Administered 2016-04-15 – 2016-04-16 (×2): 4 mg via INTRAVENOUS
  Filled 2016-04-14 (×2): qty 2

## 2016-04-14 MED ORDER — VANCOMYCIN HCL IN DEXTROSE 1-5 GM/200ML-% IV SOLN
1000.0000 mg | Freq: Once | INTRAVENOUS | Status: AC
  Administered 2016-04-14: 1000 mg via INTRAVENOUS
  Filled 2016-04-14: qty 200

## 2016-04-14 MED ORDER — ROCURONIUM BROMIDE 10 MG/ML (PF) SYRINGE
PREFILLED_SYRINGE | INTRAVENOUS | Status: AC
  Filled 2016-04-14: qty 10

## 2016-04-14 MED ORDER — ONDANSETRON HCL 4 MG PO TABS: 4.0000 mg | ORAL_TABLET | Freq: Four times a day (QID) | ORAL | Status: DC | PRN

## 2016-04-14 MED ORDER — PROPOFOL 10 MG/ML IV BOLUS
INTRAVENOUS | Status: AC
  Filled 2016-04-14: qty 20

## 2016-04-14 MED ORDER — LACTATED RINGERS IV SOLN
INTRAVENOUS | Status: DC
  Administered 2016-04-14: 08:00:00 via INTRAVENOUS

## 2016-04-14 MED ORDER — HYDROMORPHONE HCL 1 MG/ML IJ SOLN
INTRAMUSCULAR | Status: AC
  Administered 2016-04-14: 0.5 mg via INTRAVENOUS
  Filled 2016-04-14: qty 0.5

## 2016-04-14 MED ORDER — KETOROLAC TROMETHAMINE 30 MG/ML IJ SOLN
30.0000 mg | Freq: Once | INTRAMUSCULAR | Status: AC
  Administered 2016-04-14: 30 mg via INTRAVENOUS
  Filled 2016-04-14: qty 1

## 2016-04-14 MED ORDER — METHOCARBAMOL 500 MG PO TABS
500.0000 mg | ORAL_TABLET | Freq: Four times a day (QID) | ORAL | Status: DC | PRN
  Administered 2016-04-14 – 2016-04-19 (×4): 500 mg via ORAL
  Filled 2016-04-14 (×3): qty 1

## 2016-04-14 MED ORDER — FENTANYL CITRATE (PF) 100 MCG/2ML IJ SOLN
INTRAMUSCULAR | Status: AC
  Filled 2016-04-14: qty 4

## 2016-04-14 MED ORDER — SODIUM CHLORIDE 0.9 % IV BOLUS (SEPSIS)
1000.0000 mL | Freq: Once | INTRAVENOUS | Status: AC
Start: 2016-04-14 — End: 2016-04-14
  Administered 2016-04-14: 1000 mL via INTRAVENOUS

## 2016-04-14 MED ORDER — VITAMIN C 500 MG PO TABS
1000.0000 mg | ORAL_TABLET | Freq: Every day | ORAL | Status: DC
  Administered 2016-04-14 – 2016-04-19 (×6): 1000 mg via ORAL
  Filled 2016-04-14 (×6): qty 2

## 2016-04-14 MED ORDER — QUETIAPINE FUMARATE 400 MG PO TABS: 200.0000 mg | ORAL_TABLET | Freq: Every evening | ORAL | Status: DC | PRN

## 2016-04-14 MED ORDER — METHOCARBAMOL 500 MG PO TABS
ORAL_TABLET | ORAL | Status: AC
  Administered 2016-04-14: 500 mg via ORAL
  Filled 2016-04-14: qty 1

## 2016-04-14 MED ORDER — PHENYLEPHRINE HCL 10 MG/ML IJ SOLN
INTRAMUSCULAR | Status: DC | PRN
  Administered 2016-04-14 (×2): 120 ug via INTRAVENOUS

## 2016-04-14 MED ORDER — 0.9 % SODIUM CHLORIDE (POUR BTL) OPTIME
TOPICAL | Status: DC | PRN
  Administered 2016-04-14: 1000 mL

## 2016-04-14 MED ORDER — HYDROMORPHONE HCL 1 MG/ML IJ SOLN: 0.2500 mg | INTRAMUSCULAR | Status: DC | PRN

## 2016-04-14 MED ORDER — HYDROCODONE-ACETAMINOPHEN 5-325 MG PO TABS
ORAL_TABLET | ORAL | Status: AC
  Administered 2016-04-14: 2 via ORAL
  Filled 2016-04-14: qty 2

## 2016-04-14 MED ORDER — GUAIFENESIN-DM 100-10 MG/5ML PO SYRP
5.0000 mL | ORAL_SOLUTION | ORAL | Status: DC | PRN
  Administered 2016-04-14 (×2): 5 mL via ORAL
  Filled 2016-04-14: qty 5
  Filled 2016-04-14: qty 10

## 2016-04-14 MED ORDER — MIDAZOLAM HCL 2 MG/2ML IJ SOLN
INTRAMUSCULAR | Status: AC
  Filled 2016-04-14: qty 2

## 2016-04-14 MED ORDER — ENOXAPARIN SODIUM 40 MG/0.4ML ~~LOC~~ SOLN
40.0000 mg | SUBCUTANEOUS | Status: DC
  Administered 2016-04-14 – 2016-04-16 (×3): 40 mg via SUBCUTANEOUS
  Filled 2016-04-14 (×3): qty 0.4

## 2016-04-14 MED ORDER — LACTATED RINGERS IV SOLN
INTRAVENOUS | Status: DC
  Administered 2016-04-14 – 2016-04-16 (×2): via INTRAVENOUS

## 2016-04-14 MED ORDER — INFLUENZA VAC SPLIT QUAD 0.5 ML IM SUSY: 0.5000 mL | PREFILLED_SYRINGE | INTRAMUSCULAR | Status: DC

## 2016-04-14 MED ORDER — LISINOPRIL 5 MG PO TABS
5.0000 mg | ORAL_TABLET | Freq: Every day | ORAL | Status: DC
  Administered 2016-04-14 – 2016-04-19 (×6): 5 mg via ORAL
  Filled 2016-04-14 (×7): qty 1

## 2016-04-14 MED ORDER — KETAMINE HCL-SODIUM CHLORIDE 100-0.9 MG/10ML-% IV SOSY
PREFILLED_SYRINGE | INTRAVENOUS | Status: AC
  Filled 2016-04-14: qty 10

## 2016-04-14 SURGICAL SUPPLY — 54 items
BANDAGE ACE 3X5.8 VEL STRL LF (GAUZE/BANDAGES/DRESSINGS) ×3 IMPLANT
BANDAGE ACE 4X5 VEL STRL LF (GAUZE/BANDAGES/DRESSINGS) ×3 IMPLANT
BANDAGE COBAN STERILE 2 (GAUZE/BANDAGES/DRESSINGS) IMPLANT
BANDAGE ELASTIC 3 VELCRO ST LF (GAUZE/BANDAGES/DRESSINGS) ×3 IMPLANT
BNDG CMPR 9X4 STRL LF SNTH (GAUZE/BANDAGES/DRESSINGS)
BNDG COHESIVE 1X5 TAN STRL LF (GAUZE/BANDAGES/DRESSINGS) IMPLANT
BNDG CONFORM 2 STRL LF (GAUZE/BANDAGES/DRESSINGS) IMPLANT
BNDG ESMARK 4X9 LF (GAUZE/BANDAGES/DRESSINGS) IMPLANT
BNDG GAUZE ELAST 4 BULKY (GAUZE/BANDAGES/DRESSINGS) ×3 IMPLANT
CORDS BIPOLAR (ELECTRODE) ×3 IMPLANT
COVER SURGICAL LIGHT HANDLE (MISCELLANEOUS) ×3 IMPLANT
DECANTER SPIKE VIAL GLASS SM (MISCELLANEOUS) ×3 IMPLANT
DRAIN PENROSE 1/4X12 LTX STRL (WOUND CARE) IMPLANT
DRSG ADAPTIC 3X8 NADH LF (GAUZE/BANDAGES/DRESSINGS) IMPLANT
DRSG EMULSION OIL 3X3 NADH (GAUZE/BANDAGES/DRESSINGS) ×3 IMPLANT
DRSG PAD ABDOMINAL 8X10 ST (GAUZE/BANDAGES/DRESSINGS) ×6 IMPLANT
GAUZE PACKING IODOFORM 1/4X15 (GAUZE/BANDAGES/DRESSINGS) ×3 IMPLANT
GAUZE SPONGE 4X4 12PLY STRL (GAUZE/BANDAGES/DRESSINGS) ×3 IMPLANT
GAUZE XEROFORM 1X8 LF (GAUZE/BANDAGES/DRESSINGS) ×3 IMPLANT
GLOVE BIO SURGEON STRL SZ7.5 (GLOVE) ×3 IMPLANT
GLOVE BIOGEL PI IND STRL 8 (GLOVE) ×1 IMPLANT
GLOVE BIOGEL PI INDICATOR 8 (GLOVE) ×2
GOWN STRL REUS W/ TWL LRG LVL3 (GOWN DISPOSABLE) ×1 IMPLANT
GOWN STRL REUS W/TWL LRG LVL3 (GOWN DISPOSABLE) ×3
KIT BASIN OR (CUSTOM PROCEDURE TRAY) ×3 IMPLANT
KIT ROOM TURNOVER OR (KITS) ×3 IMPLANT
LOOP VESSEL MAXI BLUE (MISCELLANEOUS) IMPLANT
MANIFOLD NEPTUNE II (INSTRUMENTS) ×3 IMPLANT
NEEDLE HYPO 25X1 1.5 SAFETY (NEEDLE) IMPLANT
NS IRRIG 1000ML POUR BTL (IV SOLUTION) ×3 IMPLANT
PACK ORTHO EXTREMITY (CUSTOM PROCEDURE TRAY) ×3 IMPLANT
PAD ARMBOARD 7.5X6 YLW CONV (MISCELLANEOUS) ×6 IMPLANT
PAD CAST 3X4 CTTN HI CHSV (CAST SUPPLIES) ×1 IMPLANT
PADDING CAST COTTON 3X4 STRL (CAST SUPPLIES) ×3
SCRUB BETADINE 4OZ XXX (MISCELLANEOUS) ×3 IMPLANT
SET CYSTO W/LG BORE CLAMP LF (SET/KITS/TRAYS/PACK) ×3 IMPLANT
SOLUTION BETADINE 4OZ (MISCELLANEOUS) ×3 IMPLANT
SPLINT PLASTER EXTRA FAST 3X15 (CAST SUPPLIES) ×2
SPLINT PLASTER GYPS XFAST 3X15 (CAST SUPPLIES) ×1 IMPLANT
SPONGE GAUZE 4X4 12PLY STER LF (GAUZE/BANDAGES/DRESSINGS) ×3 IMPLANT
SPONGE LAP 4X18 X RAY DECT (DISPOSABLE) ×3 IMPLANT
SUT ETHILON 4 0 P 3 18 (SUTURE) IMPLANT
SUT ETHILON 4 0 PS 2 18 (SUTURE) ×3 IMPLANT
SUT MON AB 5-0 P3 18 (SUTURE) IMPLANT
SYR CONTROL 10ML LL (SYRINGE) IMPLANT
TOWEL OR 17X24 6PK STRL BLUE (TOWEL DISPOSABLE) ×3 IMPLANT
TOWEL OR 17X26 10 PK STRL BLUE (TOWEL DISPOSABLE) ×3 IMPLANT
TUBE ANAEROBIC SPECIMEN COL (MISCELLANEOUS) IMPLANT
TUBE CONNECTING 12'X1/4 (SUCTIONS) ×1
TUBE CONNECTING 12X1/4 (SUCTIONS) ×2 IMPLANT
TUBE FEEDING 5FR 15 INCH (TUBING) IMPLANT
UNDERPAD 30X30 (UNDERPADS AND DIAPERS) ×3 IMPLANT
WATER STERILE IRR 1000ML POUR (IV SOLUTION) ×3 IMPLANT
YANKAUER SUCT BULB TIP NO VENT (SUCTIONS) ×3 IMPLANT

## 2016-04-14 NOTE — Progress Notes (Signed)
   04/14/16 1100  Clinical Encounter Type  Visited With Patient  Visit Type Initial  Referral From Patient  Consult/Referral To Chaplain  Recommendations (follow-up)  Spiritual Encounters  Spiritual Needs Sacred text;Prayer;Emotional  Stress Factors  Patient Stress Factors Family relationships;Health changes  Family Stress Factors Family relationships;Health changes  Pt. Requested bible.  Chaplain prayed with patient.  Pt. Concerned abut family relationship, son, daughter, health changes. Emotional support, ministry of presence, prayer.   [place] [employer] [phone]

## 2016-04-14 NOTE — Anesthesia Preprocedure Evaluation (Addendum)
Anesthesia Evaluation  Patient identified by MRN, date of birth, ID band Patient awake    Reviewed: Allergy & Precautions, H&P , Patient's Chart, lab work & pertinent test results, reviewed documented beta blocker date and time   Airway Mallampati: II  TM Distance: >3 FB Neck ROM: full    Dental no notable dental hx.    Pulmonary Current Smoker,    Pulmonary exam normal breath sounds clear to auscultation       Cardiovascular hypertension,  Rhythm:regular Rate:Normal     Neuro/Psych PSYCHIATRIC DISORDERS    GI/Hepatic   Endo/Other    Renal/GU      Musculoskeletal   Abdominal   Peds  Hematology   Anesthesia Other Findings   Reproductive/Obstetrics                             Anesthesia Physical Anesthesia Plan  ASA: II  Anesthesia Plan:    Post-op Pain Management:    Induction: Intravenous  Airway Management Planned: LMA  Additional Equipment:   Intra-op Plan:   Post-operative Plan:   Informed Consent: I have reviewed the patients History and Physical, chart, labs and discussed the procedure including the risks, benefits and alternatives for the proposed anesthesia with the patient or authorized representative who has indicated his/her understanding and acceptance.   Dental Advisory Given and Dental advisory given  Plan Discussed with: CRNA and Surgeon  Anesthesia Plan Comments: (Discussed GA with LMA, possible sore throat, potential need to switch to ETT, N/V, pulmonary aspiration. Questions answered. )        Anesthesia Quick Evaluation

## 2016-04-14 NOTE — Anesthesia Postprocedure Evaluation (Signed)
Anesthesia Post Note  Patient: [name]  Procedure(s) Performed: Procedure(s) (LRB): IRRIGATION AND DEBRIDEMENT EXTREMITY (Right)  Patient location during evaluation: PACU Anesthesia Type: General Level of consciousness: sedated Pain management: satisfactory to patient Vital Signs Assessment: post-procedure vital signs reviewed and stable Respiratory status: spontaneous breathing Cardiovascular status: stable Anesthetic complications: no    Last Vitals:  Vitals:   04/14/16 0945 04/14/16 1012  BP:  106/64  Pulse: (!) 105 (!) 105  Resp: 19   Temp: 36.1 C 37.2 C    Last Pain:  Vitals:   04/14/16 1030  TempSrc:   PainSc: [place]

## 2016-04-14 NOTE — ED Notes (Signed)
Called 5n x2 with no response

## 2016-04-14 NOTE — Progress Notes (Signed)
Hydrotherapy Cancellation Note  Patient Details Name: [name] MRN: [id number] DOB: [birth year]   Cancelled Treatment:    Reason Eval/Treat Not Completed: Other (comment) (Order to start on 11/22)   [name] [facility] 04/14/2016, 11:03 AM [place]

## 2016-04-14 NOTE — H&P (Signed)
History and Physical    [name] [id number] DOB: 11/05/1960 DOA: 04/13/2016   PCP: [place] Chief Complaint:  Chief Complaint  Patient presents with  . Cellulitis    HPI: [name] is a 55 y.o. female with medical history significant of IVDU (usually denies it, finally got her to admit to me that she was "injecting seroquel so she could sleep").  Patient presents to the ED with c/o cellulitis of the R hand.  Symptoms onset yesterday.  She attempts to attribute the lesions on her hands and wrists to "bug bites" initially to the EDP.  Regardless she notes swelling of her R hand and R wrist.  Tetanus status UTD.  No N/V/D.  Does complain of R sided chest pain and cough.  She noted an abscess to her R hand and wrist that she "popped with a thumb tack".  Of note she has similar presentation and admission for cellulitis in 2016.  ED Course: In addition to cellulitis of R hand, patient also may have RLL PNA on CXR.  Review of Systems: As per HPI otherwise 10 point review of systems negative.    Past Medical History:  Diagnosis Date  . Anxiety   . Bipolar affective disorder ([place])    ADHD AND SCHZIOPHRENIC--PT GOES TO [place]. BLANKMAN  . Blood transfusion without reported diagnosis   . Cervical radiculopathy   . Chronic pain syndrome   . Complication of anesthesia    STATES SHE WOKE UP TWICE DURING SURGERIES  . DJD (degenerative joint disease)   . GERD (gastroesophageal reflux disease)   . H/O hiatal hernia   . Hepatitis C infection    STATES COMPLETED TREATMENT AND NO LONGER HAS INFECTION  . Hypertension   . Leukocytopenia   . Osteoarthritis   . Osteoporosis   . Peptic ulcer disease   . Pneumonia 2013  . Polysubstance (excluding opioids) dependence ([place])   . Seizures (HCC)    SEIZURE AFTER BREATHING TX (ALBUTEROL) IN ER; ALSO PAST HX SEIZURES DURING DRUG DETOX  . Shortness of breath    WITH EXERTION; SMOKER  . Thrombocytopenia  ([place])     Past Surgical History:  Procedure Laterality Date  . ABDOMINAL HYSTERECTOMY    . APPENDECTOMY    . CHOLECYSTECTOMY    . Hyperectomy     due to obstructive ovarian cytst  . JOINT REPLACEMENT  2012   RIGHT TOTAL HIP REPLACEMENT  . Right knee arthroscopic surgery    . SPINE SURGERY     CERVIAL 6-7 SURGERY - FUSION  . TONSILECTOMY, ADENOIDECTOMY, BILATERAL MYRINGOTOMY AND TUBES    . TOTAL HIP ARTHROPLASTY Left 08/24/2012   Procedure: LEFT TOTAL HIP ARTHROPLASTY ANTERIOR APPROACH;  Surgeon: [name], MD;  Location: [facility];  Service: Orthopedics;  Laterality: Left;  . Total hip replacement       reports that she has been smoking Cigarettes.  She has a 10.00 pack-year smoking history. She has never used smokeless tobacco. She reports that she does not drink alcohol or use drugs.  Allergies  Allergen Reactions  . Albuterol Other (See Comments)    PT states she had seizures from an albuterol treatment and was hospitalized for it  . Aspirin Hives    Vomits blood  . Adhesive [Tape] Rash    Family History  Problem Relation Age of Onset  . Breast cancer Sister   . Cancer Sister   . Heart disease Mother   .  Heart disease Father       Prior to Admission medications   Medication Sig Start Date End Date Taking? Authorizing Provider  ALPRAZolam Duanne Moron) 1 MG tablet Take 1 mg by mouth 2 (two) times daily as needed for anxiety.  03/19/16  Yes Historical Provider, MD  amphetamine-dextroamphetamine (ADDERALL) 10 MG tablet Take 10 mg by mouth 2 (two) times daily. 03/28/16  Yes Historical Provider, MD  amphetamine-dextroamphetamine (ADDERALL) 20 MG tablet Take 20 mg by mouth 3 (three) times daily. 05/14/16  Yes Historical Provider, MD  lisinopril (PRINIVIL,ZESTRIL) 5 MG tablet Take 1 tablet (5 mg total) by mouth daily. 08/14/14  Yes Freeman Caldron Baker, PA-C  naproxen sodium (ANAPROX) 220 MG tablet Take 440 mg by mouth every 12 (twelve) hours as needed (pain).    Yes Historical  Provider, MD  QUEtiapine (SEROQUEL) 200 MG tablet Take 200 mg by mouth at bedtime as needed (mood, agitation).  03/19/16  Yes Historical Provider, MD    Physical Exam: Vitals:   04/13/16 2354 04/14/16 0229  BP: 147/85 125/69  Pulse: (!) 130 120  Resp: 16 18  Temp: 98.6 F (37 C)   TempSrc: Oral   SpO2: 96% 93%  Weight: 72.6 kg (160 lb)   Height: 5\' 7"  (1.702 m)       Constitutional: NAD, calm, comfortable Eyes: PERRL, lids and conjunctivae normal ENMT: Mucous membranes are moist. Posterior pharynx clear of any exudate or lesions.Normal dentition.  Neck: normal, supple, no masses, no thyromegaly Respiratory: clear to auscultation bilaterally, no wheezing, no crackles. Normal respiratory effort. No accessory muscle use.  Cardiovascular: Regular rate and rhythm, no murmurs / rubs / gallops. No extremity edema. 2+ pedal pulses. No carotid bruits.  Abdomen: no tenderness, no masses palpated. No hepatosplenomegaly. Bowel sounds positive.  Musculoskeletal: no clubbing / cyanosis. No joint deformity upper and lower extremities. Good ROM, no contractures. Normal muscle tone.  Skin: no rashes, lesions, ulcers. No induration Neurologic: CN 2-12 grossly intact. Sensation intact, DTR normal. Strength 5/5 in all 4.  Psychiatric: Normal judgment and insight. Alert and oriented x 3. Normal mood.    Labs on Admission: I have personally reviewed following labs and imaging studies  CBC:  Recent Labs Lab 04/14/16 0152 04/14/16 0305  WBC 11.5*  --   NEUTROABS 9.1*  --   HGB 13.0 12.2  HCT 38.4 36.0  MCV 91.2  --   PLT 114*  --    Basic Metabolic Panel:  Recent Labs Lab 04/14/16 0305  NA 134*  K 3.1*  CL 97*  GLUCOSE 141*  BUN 9  CREATININE 0.70   GFR: Estimated Creatinine Clearance: 77.3 mL/min (by C-G formula based on SCr of 0.7 mg/dL). Liver Function Tests: No results for input(s): AST, ALT, ALKPHOS, BILITOT, PROT, ALBUMIN in the last 168 hours. No results for input(s):  LIPASE, AMYLASE in the last 168 hours. No results for input(s): AMMONIA in the last 168 hours. Coagulation Profile: No results for input(s): INR, PROTIME in the last 168 hours. Cardiac Enzymes: No results for input(s): CKTOTAL, CKMB, CKMBINDEX, TROPONINI in the last 168 hours. BNP (last 3 results) No results for input(s): PROBNP in the last 8760 hours. HbA1C: No results for input(s): HGBA1C in the last 72 hours. CBG: No results for input(s): GLUCAP in the last 168 hours. Lipid Profile: No results for input(s): CHOL, HDL, LDLCALC, TRIG, CHOLHDL, LDLDIRECT in the last 72 hours. Thyroid Function Tests: No results for input(s): TSH, T4TOTAL, FREET4, T3FREE, THYROIDAB in the last 72  hours. Anemia Panel: No results for input(s): VITAMINB12, FOLATE, FERRITIN, TIBC, IRON, RETICCTPCT in the last 72 hours. Urine analysis:    Component Value Date/Time   COLORURINE YELLOW 08/18/2012 1119   APPEARANCEUR CLEAR 08/18/2012 1119   LABSPEC 1.025 08/18/2012 1119   PHURINE 6.0 08/18/2012 1119   GLUCOSEU NEGATIVE 08/18/2012 1119   HGBUR NEGATIVE 08/18/2012 1119   BILIRUBINUR NEGATIVE 08/18/2012 1119   KETONESUR NEGATIVE 08/18/2012 1119   PROTEINUR NEGATIVE 08/18/2012 1119   UROBILINOGEN 1.0 08/18/2012 1119   NITRITE NEGATIVE 08/18/2012 1119   LEUKOCYTESUR NEGATIVE 08/18/2012 1119   Sepsis Labs: @LABRCNTIP (procalcitonin:4,lacticidven:4) )No results found for this or any previous visit (from the past 240 hour(s)).   Radiological Exams on Admission: Dg Chest 2 View  Result Date: 04/14/2016 CLINICAL DATA:  Left arm swelling since yesterday. The irritated sleeve bites on the hands. Skin abscess. Swelling in the hand and arm. Redness. Right lower rib cage pain after coughing yesterday. EXAM: CHEST  2 VIEW COMPARISON:  10/06/2015 FINDINGS: There is focal area of increased density in the right lung base laterally which may indicate focal pneumonia. Left lung is clear. Normal heart size and pulmonary  vascularity. No blunting of costophrenic angles. No pneumothorax. Mediastinal contours appear intact. Calcified aorta. Esophageal hiatal hernia behind the heart. Postoperative changes in the cervical spine. IMPRESSION: Small focal area of infiltration in the right lung base may indicate focal pneumonia. Electronically Signed   By: Lucienne Capers M.D.   On: 04/14/2016 00:59   Dg Hand Complete Right  Result Date: 04/14/2016 CLINICAL DATA:  Left arm swelling.  Reported flea bites. EXAM: RIGHT HAND - COMPLETE 3+ VIEW COMPARISON:  None. FINDINGS: There is no evidence of fracture or dislocation. There is no evidence of arthropathy or other focal bone abnormality. Soft tissues are unremarkable. IMPRESSION: No focal osseous abnormality. Electronically Signed   By: Ulyses Jarred M.D.   On: 04/14/2016 00:59    EKG: Independently reviewed.  Assessment/Plan Principal Problem:   Cellulitis of right hand Active Problems:   Community acquired pneumonia of right lower lobe of lung ([place])    1. Cellulitis of R hand - Likely secondary to IVDA, I was able to get the patient to admit to "injecting seroquel, but this was in the Left hand". 1. EDP spoke with Dr. Fredna Dow 1. Keeping patient NPO for possible surgery 2. Will replace K 3. Zosyn / vanc 2. RLL CAP - does show according to radiologist, though I do question this. 1. Regardless patient is already being put on Zosyn and vanc as above 2. Blood cx 3. Sputum cx   DVT prophylaxis: SCDs in case surgery tomorrow Code Status: Full Family Communication: No family in room Consults called: Dr. Fredna Dow called by EDP Admission status: Admit to inpatient   Etta Quill DO [facility] Pager [phone] from 7PM-7AM  If 7AM-7PM, please contact the day physician for the patient [email or web address] Password [email or web address]  04/14/2016, 4:00 AM

## 2016-04-14 NOTE — Transfer of Care (Signed)
Immediate Anesthesia Transfer of Care Note  Patient: [name]  Procedure(s) Performed: Procedure(s): IRRIGATION AND DEBRIDEMENT EXTREMITY (Right)  Patient Location: PACU  Anesthesia Type:General  Level of Consciousness: awake and patient cooperative  Airway & Oxygen Therapy: Patient Spontanous Breathing  Post-op Assessment: Report given to RN and Post -op Vital signs reviewed and stable  Post vital signs: Reviewed and stable  Last Vitals:  Vitals:   04/14/16 0436 04/14/16 0547  BP: 128/84 128/82  Pulse: 107 (!) 107  Resp: 20 18  Temp: 36.9 C 36.9 C    Last Pain:  Vitals:   04/14/16 0730  TempSrc:   PainSc: 3          Complications: No apparent anesthesia complications

## 2016-04-14 NOTE — H&P (Signed)
[name] is an 55 y.o. female.   Chief Complaint: right hand infection HPI: 55 yo rhd female states she has been having issues with right hand for ~ 3 days.  Has gotten worse in the last day.  Tried to pop infected area with a tack that she sterilized.  Has had progressively worsening pain, swelling, erythema.  She describes a burning pain that is 7-8/10 in severity.  It has improved with IV Abx and is worsened with motion/palpation.  Case discussed with [name], MD and her note from 04/14/2016 reviewed. Xrays viewed and interpreted by me: 3 views right hand show no fractures, dislocations, radioopaque foreign bodies. Labs reviewed: WBC 11.5  Allergies:  Allergies  Allergen Reactions  . Albuterol Other (See Comments)    PT states she had seizures from an albuterol treatment and was hospitalized for it  . Aspirin Hives    Vomits blood  . Adhesive [Tape] Rash    Past Medical History:  Diagnosis Date  . Anxiety   . Bipolar affective disorder ([place])    ADHD AND SCHZIOPHRENIC--PT GOES TO [place]. BLANKMAN  . Blood transfusion without reported diagnosis   . Cervical radiculopathy   . Chronic pain syndrome   . Complication of anesthesia    STATES SHE WOKE UP TWICE DURING SURGERIES  . DJD (degenerative joint disease)   . GERD (gastroesophageal reflux disease)   . H/O hiatal hernia   . Hepatitis C infection    STATES COMPLETED TREATMENT AND NO LONGER HAS INFECTION  . Hypertension   . Leukocytopenia   . Osteoarthritis   . Osteoporosis   . Peptic ulcer disease   . Pneumonia 2013  . Polysubstance (excluding opioids) dependence ([place])   . Seizures (HCC)    SEIZURE AFTER BREATHING TX (ALBUTEROL) IN ER; ALSO PAST HX SEIZURES DURING DRUG DETOX  . Shortness of breath    WITH EXERTION; SMOKER  . Thrombocytopenia ([place])     Past Surgical History:  Procedure Laterality Date  . ABDOMINAL HYSTERECTOMY    . APPENDECTOMY    . CHOLECYSTECTOMY    . Hyperectomy      due to obstructive ovarian cytst  . JOINT REPLACEMENT  2012   RIGHT TOTAL HIP REPLACEMENT  . Right knee arthroscopic surgery    . SPINE SURGERY     CERVIAL 6-7 SURGERY - FUSION  . TONSILECTOMY, ADENOIDECTOMY, BILATERAL MYRINGOTOMY AND TUBES    . TOTAL HIP ARTHROPLASTY Left 08/24/2012   Procedure: LEFT TOTAL HIP ARTHROPLASTY ANTERIOR APPROACH;  Surgeon: [name], MD;  Location: [facility];  Service: Orthopedics;  Laterality: Left;  . Total hip replacement      Family History: Family History  Problem Relation Age of Onset  . Breast cancer Sister   . Cancer Sister   . Heart disease Mother   . Heart disease Father     Social History:   reports that she has been smoking Cigarettes.  She has a 10.00 pack-year smoking history. She has never used smokeless tobacco. She reports that she does not drink alcohol or use drugs.  Medications: Medications Prior to Admission  Medication Sig Dispense Refill  . ALPRAZolam (XANAX) 1 MG tablet Take 1 mg by mouth 2 (two) times daily as needed for anxiety.     Marland Kitchen amphetamine-dextroamphetamine (ADDERALL) 10 MG tablet Take 10 mg by mouth 2 (two) times daily.    Derrill Memo ON 05/14/2016] amphetamine-dextroamphetamine (ADDERALL) 20 MG tablet Take 20 mg by mouth 3 (  three) times daily.    Marland Kitchen lisinopril (PRINIVIL,ZESTRIL) 5 MG tablet Take 1 tablet (5 mg total) by mouth daily. 30 tablet 0  . naproxen sodium (ANAPROX) 220 MG tablet Take 440 mg by mouth every 12 (twelve) hours as needed (pain).     . QUEtiapine (SEROQUEL) 200 MG tablet Take 200 mg by mouth at bedtime as needed (mood, agitation).       Results for orders placed or performed during the hospital encounter of 04/13/16 (from the past 48 hour(s))  CBC with Differential/Platelet     Status: Abnormal   Collection Time: 04/14/16  1:52 AM  Result Value Ref Range   WBC 11.5 (H) 4.0 - 10.5 K/uL   RBC 4.21 3.87 - 5.11 MIL/uL   Hemoglobin 13.0 12.0 - 15.0 g/dL   HCT 38.4 36.0 - 46.0 %   MCV 91.2 78.0 -  100.0 fL   MCH 30.9 26.0 - 34.0 pg   MCHC 33.9 30.0 - 36.0 g/dL   RDW 14.1 11.5 - 15.5 %   Platelets 114 (L) 150 - 400 K/uL    Comment: RESULT REPEATED AND VERIFIED SPECIMEN CHECKED FOR CLOTS PLATELET COUNT CONFIRMED BY SMEAR    Neutrophils Relative % 79 %   Neutro Abs 9.1 (H) 1.7 - 7.7 K/uL   Lymphocytes Relative 11 %   Lymphs Abs 1.3 0.7 - 4.0 K/uL   Monocytes Relative 9 %   Monocytes Absolute 1.1 (H) 0.1 - 1.0 K/uL   Eosinophils Relative 0 %   Eosinophils Absolute 0.0 0.0 - 0.7 K/uL   Basophils Relative 0 %   Basophils Absolute 0.0 0.0 - 0.1 K/uL  I-Stat Chem 8, ED     Status: Abnormal   Collection Time: 04/14/16  3:05 AM  Result Value Ref Range   Sodium 134 (L) 135 - 145 mmol/L   Potassium 3.1 (L) 3.5 - 5.1 mmol/L   Chloride 97 (L) 101 - 111 mmol/L   BUN 9 6 - 20 mg/dL   Creatinine, Ser 0.70 0.44 - 1.00 mg/dL   Glucose, Bld 141 (H) 65 - 99 mg/dL   Calcium, Ion 1.11 (L) 1.15 - 1.40 mmol/L   TCO2 24 0 - 100 mmol/L   Hemoglobin 12.2 12.0 - 15.0 g/dL   HCT 36.0 36.0 - 46.0 %    Dg Chest 2 View  Result Date: 04/14/2016 CLINICAL DATA:  Left arm swelling since yesterday. The irritated sleeve bites on the hands. Skin abscess. Swelling in the hand and arm. Redness. Right lower rib cage pain after coughing yesterday. EXAM: CHEST  2 VIEW COMPARISON:  10/06/2015 FINDINGS: There is focal area of increased density in the right lung base laterally which may indicate focal pneumonia. Left lung is clear. Normal heart size and pulmonary vascularity. No blunting of costophrenic angles. No pneumothorax. Mediastinal contours appear intact. Calcified aorta. Esophageal hiatal hernia behind the heart. Postoperative changes in the cervical spine. IMPRESSION: Small focal area of infiltration in the right lung base may indicate focal pneumonia. Electronically Signed   By: Lucienne Capers M.D.   On: 04/14/2016 00:59   Dg Hand Complete Right  Result Date: 04/14/2016 CLINICAL DATA:  Left arm  swelling.  Reported flea bites. EXAM: RIGHT HAND - COMPLETE 3+ VIEW COMPARISON:  None. FINDINGS: There is no evidence of fracture or dislocation. There is no evidence of arthropathy or other focal bone abnormality. Soft tissues are unremarkable. IMPRESSION: No focal osseous abnormality. Electronically Signed   By: Ulyses Jarred M.D.   On:  04/14/2016 00:59     A comprehensive review of systems was negative except for: Constitutional: positive for chills, fevers and night sweats Respiratory: positive for cough Gastrointestinal: positive for nausea and vomiting    Blood pressure 128/82, pulse (!) 107, temperature 98.4 F (36.9 C), temperature source Oral, resp. rate 18, height 5\' 7"  (1.702 m), weight 72.5 kg (159 lb 13.3 oz), SpO2 94 %.  General appearance: alert, cooperative and appears stated age Head: Normocephalic, without obvious abnormality, atraumatic Neck: supple, symmetrical, trachea midline Extremities: Intact sensation and capillary refill all digits.  +epl/fpl/io.  Right hand swollen and erythematous.  Most swelling/erythema at thumb dorsally at metacarpal and in thenar eminence.  TTP in these areas.  Some tenderness in hand.  Minimal in fingers and wrist/forearm.  Able to move all digits, including thumb, and wrist without significant pain.  Area at dorsum of thumb proximal to MP where she tried to pop infection. Pulses: 2+ and symmetric Skin: Skin color, texture, turgor normal. No rashes or lesions Neurologic: Grossly normal Incision/Wound: As above  Assessment/Plan Right hand abscess including thenar eminence.  Recommend OR for incision and drainage.  Risks, benefits, and alternatives of surgery were discussed and the patient agrees with the plan of care.   Letanya Froh R 04/14/2016, 7:29 AM

## 2016-04-14 NOTE — Anesthesia Procedure Notes (Signed)
Procedure Name: LMA Insertion Date/Time: 04/14/2016 8:26 AM Performed by: Lance Coon Pre-anesthesia Checklist: Patient identified, Emergency Drugs available, Suction available, Patient being monitored and Timeout performed Patient Re-evaluated:Patient Re-evaluated prior to inductionOxygen Delivery Method: Circle system utilized Preoxygenation: Pre-oxygenation with 100% oxygen Intubation Type: IV induction LMA: LMA inserted LMA Size: 4.0 Number of attempts: 1 Placement Confirmation: positive ETCO2 and breath sounds checked- equal and bilateral Tube secured with: Tape Dental Injury: Teeth and Oropharynx as per pre-operative assessment

## 2016-04-14 NOTE — Brief Op Note (Signed)
04/13/2016 - 04/14/2016  9:01 AM  PATIENT:  [name]  55 y.o. female  PRE-OPERATIVE DIAGNOSIS:  Right hand abscess  POST-OPERATIVE DIAGNOSIS:  Right hand abscess  PROCEDURE:  Procedure(s): IRRIGATION AND DEBRIDEMENT EXTREMITY (Right)  SURGEON:  Surgeon(s) and Role:    * Leanora Cover, MD - Primary  PHYSICIAN ASSISTANT:   ASSISTANTS: none   ANESTHESIA:   general  EBL:  Total I/O In: -  Out: 5 [Blood:5]  BLOOD ADMINISTERED:none  DRAINS: iodoform packing  LOCAL MEDICATIONS USED:  MARCAINE     SPECIMEN:  Source of Specimen:  right hand  DISPOSITION OF SPECIMEN:  micro  COUNTS:  YES  TOURNIQUET:  Right arm: ~ 20 minutes at 250 mmHg  DICTATION: .Other Dictation: Dictation Number no confirmation number given  PLAN OF CARE: Discharge to home after PACU  PATIENT DISPOSITION:  PACU - hemodynamically stable.   Delay start of Pharmacological VTE agent (>24hrs) due to surgical blood loss or risk of bleeding: no

## 2016-04-14 NOTE — Op Note (Signed)
[name], [name] NO.:  [id number]  MEDICAL RECORD NO.:  [id number]  LOCATION:  [id number]                        FACILITY:  [facility]  PHYSICIAN:  Leanora Cover, MD        DATE OF BIRTH:  Sep 16, 1960  DATE OF PROCEDURE:  04/14/2016 DATE OF DISCHARGE:                              OPERATIVE REPORT   PREOPERATIVE DIAGNOSIS:  Right hand and thenar eminence abscess.  POSTOPERATIVE DIAGNOSIS:  Right hand and thenar eminence abscess.  PROCEDURE:  Incision and drainage of right hand abscess on dorsum of thumb metacarpal and right thenar eminence in 2 incisions.  SURGEON:  Leanora Cover, MD  ASSISTANT:  None.  ANESTHESIA:  General.  IV FLUIDS:  Per anesthesia flow sheet.  ESTIMATED BLOOD LOSS:  Minimal.  COMPLICATIONS:  None.  SPECIMENS:  Cultures to Micro.  TOURNIQUET TIME:  Approximately 20 minutes.  DISPOSITION:  Stable to PACU.  INDICATIONS:  [name] is a 55 year old right-hand dominant female who has had progressively worsening pain, swelling, and erythema of the right hand and thenar eminence.  She presented to [facility] Emergency Department yesterday, where she was evaluated and felt to have an abscess of the hand.  She was admitted to the hospitalist and started on IV antibiotics.  These have helped to improve her symptoms some.  She has had fevers, chills, and night sweats.  I recommended incision and drainage in the operating room.  Risks, benefits, and alternatives of surgery were discussed including risk of blood loss; infection; damage to nerves, vessels, tendons, ligaments, bon; failure of surgery; need for additional surgery; complications with wound healing, continued pain, continued infection, need for repeat irrigation and debridement. She voiced understands of these risks and elected to proceed.  OPERATIVE COURSE:  After being identified preoperatively by myself, the patient and I agreed upon procedure and site of procedure.   Surgical site was marked.  The risks, benefits, and alternatives of surgery were reviewed and she wished to proceed.  Surgical consent had been signed. She is on scheduled antibiotics for her infection.  She was transferred to the operating room and placed on the operating room table in supine position with the right upper extremity on arm board.  General anesthesia induced by Anesthesiology.  Right upper extremity was prepped and draped in normal sterile orthopedic fashion.  Surgical pause was performed between surgeons, anesthesia, operating staff and all were in agreement as to the patient, procedure, and site of procedure. Tourniquet at the proximal aspect of the extremity was inflated to 250 mmHg after exsanguination of the limb with an Esmarch bandage.  Incision was made on the dorsum of the thumb over the altered skin in the area where she had poked her skin to try and drain her infection.  Gross purulence was encountered.  Cultures were taken for aerobes and anaerobes.  The soft tissues were spread.  The purulence was debrided with a Ray-Tec sponge.  The abscess cavity was followed.  It did not track proximally.  There was a coarse going toward the thenar eminence around the radial side.  The remainder of the abscess cavity was felt to have been well defined.  An additional incision was made at the radial side of the thenar eminence over the erythematous area.  This was carried into subcutaneous tissues by spreading technique.  No gross purulence was encountered.  There were approximately 5-7 small little purulent pockets under the skin.  The skin, subcutaneous tissues, and muscles were sharply debrided with the knife.  The skin edges were removed.  The muscles of the thenar eminence were spread gently.  No purulence was encountered in the deep layers.  No further purulence was noted.  The wounds were packed with quarter-inch iodoform gauze.  They were injected with 9 mL of 0.25%  plain Marcaine to aid in postoperative analgesia.  They were then dressed with sterile 4x4s and Kerlix bandage. A thumb spica splint was placed and wrapped with Kerlix and Ace bandage. Tourniquet was deflated at approximately 20 minutes.  Fingertips were pink with brisk capillary refill after deflation of tourniquet.  The operative drapes were broken down.  The patient was awakened from anesthesia safely.  She was transferred back to stretcher and taken to PACU in stable condition.  She was admitted to Medicine.  She will continue on IV antibiotics for now, pending cultures.  As her white count normalizes, she may be discharged home on oral antibiotics.  We will start hydrotherapy in 2-4 days.     Leanora Cover, MD     KK/MEDQ  D:  04/14/2016  T:  04/14/2016  Job:  [id number]

## 2016-04-14 NOTE — Progress Notes (Signed)
Pharmacy Antibiotic Note  [name] is a 54 y.o. female admitted on 04/13/2016 with cellulitis.  In the ED patient received Vancomycin 1gm and Zosyn 3.375gm IV x 1 dose each.  Pharmacy has been consulted for Vancomycin & Zosyn dosing.    Plan:  Vancomycin 750mg  IV q12h  Vancomycin trough goal: 10-15 mcg/ml  Zosyn 3.375gm IV q8h (each dose infused over 4 hrs)  F/u cultures  Follow renal function  Height: 5\' 7"  (170.2 cm) Weight: 160 lb (72.6 kg) IBW/kg (Calculated) : 61.6  Temp (24hrs), Avg:98.6 F (37 C), Min:98.6 F (37 C), Max:98.6 F (37 C)   Recent Labs Lab 04/14/16 0152 04/14/16 0305  WBC 11.5*  --   CREATININE  --  0.70    Estimated Creatinine Clearance: 77.3 mL/min (by C-G formula based on SCr of 0.7 mg/dL).    Allergies  Allergen Reactions  . Albuterol Other (See Comments)    PT states she had seizures from an albuterol treatment and was hospitalized for it  . Aspirin Hives    Vomits blood  . Adhesive [Tape] Rash    Antimicrobials this admission: 11/20 vanc >>   11/20 zosyn >>    Dose adjustments this admission:    Microbiology results:  11/20 BCx: sent 11/20 Sputum: sent   Thank you for allowing pharmacy to be a part of this patient's care.  Everette Rank, PharmD 04/14/2016 4:05 AM

## 2016-04-14 NOTE — ED Notes (Signed)
Pt made aware of need for UA 

## 2016-04-15 ENCOUNTER — Encounter (HOSPITAL_COMMUNITY): Payer: Self-pay | Admitting: Orthopedic Surgery

## 2016-04-15 DIAGNOSIS — B9562 Methicillin resistant Staphylococcus aureus infection as the cause of diseases classified elsewhere: Secondary | ICD-10-CM | POA: Diagnosis present

## 2016-04-15 DIAGNOSIS — F319 Bipolar disorder, unspecified: Secondary | ICD-10-CM

## 2016-04-15 DIAGNOSIS — R7881 Bacteremia: Secondary | ICD-10-CM | POA: Diagnosis present

## 2016-04-15 DIAGNOSIS — L02419 Cutaneous abscess of limb, unspecified: Secondary | ICD-10-CM

## 2016-04-15 DIAGNOSIS — F1990 Other psychoactive substance use, unspecified, uncomplicated: Secondary | ICD-10-CM

## 2016-04-15 LAB — BLOOD CULTURE ID PANEL (REFLEXED)
Acinetobacter baumannii: NOT DETECTED
CANDIDA GLABRATA: NOT DETECTED
CANDIDA KRUSEI: NOT DETECTED
CANDIDA PARAPSILOSIS: NOT DETECTED
CANDIDA TROPICALIS: NOT DETECTED
Candida albicans: NOT DETECTED
ENTEROBACTER CLOACAE COMPLEX: NOT DETECTED
ESCHERICHIA COLI: NOT DETECTED
Enterobacteriaceae species: NOT DETECTED
Enterococcus species: NOT DETECTED
Haemophilus influenzae: NOT DETECTED
KLEBSIELLA PNEUMONIAE: NOT DETECTED
Klebsiella oxytoca: NOT DETECTED
Listeria monocytogenes: NOT DETECTED
Methicillin resistance: DETECTED — AB
Neisseria meningitidis: NOT DETECTED
PROTEUS SPECIES: NOT DETECTED
Pseudomonas aeruginosa: NOT DETECTED
STAPHYLOCOCCUS SPECIES: DETECTED — AB
Serratia marcescens: NOT DETECTED
Staphylococcus aureus (BCID): DETECTED — AB
Streptococcus agalactiae: NOT DETECTED
Streptococcus pneumoniae: NOT DETECTED
Streptococcus pyogenes: NOT DETECTED
Streptococcus species: NOT DETECTED

## 2016-04-15 LAB — CBC
HCT: 33.2 % — ABNORMAL LOW (ref 36.0–46.0)
HEMOGLOBIN: 10.9 g/dL — AB (ref 12.0–15.0)
MCH: 30.3 pg (ref 26.0–34.0)
MCHC: 32.8 g/dL (ref 30.0–36.0)
MCV: 92.2 fL (ref 78.0–100.0)
PLATELETS: 102 10*3/uL — AB (ref 150–400)
RBC: 3.6 MIL/uL — AB (ref 3.87–5.11)
RDW: 14.2 % (ref 11.5–15.5)
WBC: 5.9 10*3/uL (ref 4.0–10.5)

## 2016-04-15 LAB — VANCOMYCIN, TROUGH: VANCOMYCIN TR: 10 ug/mL — AB (ref 15–20)

## 2016-04-15 MED ORDER — VANCOMYCIN HCL IN DEXTROSE 1-5 GM/200ML-% IV SOLN
1000.0000 mg | Freq: Two times a day (BID) | INTRAVENOUS | Status: DC
  Administered 2016-04-16 – 2016-04-17 (×3): 1000 mg via INTRAVENOUS
  Filled 2016-04-15 (×5): qty 200

## 2016-04-15 MED ORDER — ALUM & MAG HYDROXIDE-SIMETH 200-200-20 MG/5ML PO SUSP
30.0000 mL | Freq: Four times a day (QID) | ORAL | Status: DC | PRN
  Administered 2016-04-16: 30 mL via ORAL
  Filled 2016-04-15: qty 30

## 2016-04-15 MED ORDER — ADULT MULTIVITAMIN W/MINERALS CH
1.0000 | ORAL_TABLET | Freq: Every day | ORAL | Status: DC
  Administered 2016-04-15 – 2016-04-19 (×5): 1 via ORAL
  Filled 2016-04-15 (×5): qty 1

## 2016-04-15 MED ORDER — POLYETHYLENE GLYCOL 3350 17 G PO PACK
17.0000 g | PACK | Freq: Two times a day (BID) | ORAL | Status: DC
  Administered 2016-04-15 – 2016-04-16 (×3): 17 g via ORAL
  Filled 2016-04-15 (×3): qty 1

## 2016-04-15 MED ORDER — ENSURE ENLIVE PO LIQD
237.0000 mL | Freq: Two times a day (BID) | ORAL | Status: DC
  Administered 2016-04-16 – 2016-04-18 (×2): 237 mL via ORAL

## 2016-04-15 MED ORDER — SODIUM CHLORIDE 0.9 % IV SOLN
1250.0000 mg | Freq: Once | INTRAVENOUS | Status: AC
  Administered 2016-04-15: 1250 mg via INTRAVENOUS
  Filled 2016-04-15: qty 1250

## 2016-04-15 NOTE — Progress Notes (Signed)
Pt called Rn into room stating that her IV site was hurting. RN assessed site- saw it to be infiltrated. Heat packs were given to pt and a new IV was placed. RN will continue to check on previous IV site

## 2016-04-15 NOTE — Progress Notes (Signed)
Pharmacy Antibiotic Note  [name] is a 55 y.o. female admitted on 04/13/2016 with MRSA bacteremia, abscess.  Pharmacy has been consulted for Vancomycin dosing.  Steady state Vanc trough low for bacteremia, will adjust dosing.  Plan: D/C Vanc 750mg  IV- called RN & told her not to give dose due now. Vancomycin 1250mg  IV x 1, then 1000mg  IV q12 Watch renal function Repeat VT at steady state  Height: 5\' 7"  (170.2 cm) Weight: 159 lb 13.3 oz (72.5 kg) IBW/kg (Calculated) : 61.6  Temp (24hrs), Avg:98.9 F (37.2 C), Min:97.7 F (36.5 C), Max:100 F (37.8 C)   Recent Labs Lab 04/14/16 0152 04/14/16 0305 04/14/16 1047 04/15/16 0444 04/15/16 1225  WBC 11.5*  --  7.9 5.9  --   CREATININE  --  0.70 0.96  --   --   VANCOTROUGH  --   --   --   --  10*    Estimated Creatinine Clearance: 64.4 mL/min (by C-G formula based on SCr of 0.96 mg/dL).    Allergies  Allergen Reactions  . Albuterol Other (See Comments)    PT states she had seizures from an albuterol treatment and was hospitalized for it  . Aspirin Hives    Vomits blood  . Adhesive [Tape] Rash    Antimicrobials this admission:  11/20 vanc >>   11/20 zosyn >>    Dose adjustments this admission:  11/21 VT 10 (on 750mg  IV q12 -> 1250mg  IV x 1 then 1000mg  IV q12)  Microbiology results:  11/20 BCx: #1/2 MRSA 11/20 Sputum: sent  11/20 surgical wound cx>> SA (sens pending)  Thank you for allowing pharmacy to be a part of this patient's care.    Gracy Bruins, PharmD Clinical Pharmacist [facility]

## 2016-04-15 NOTE — Consult Note (Signed)
[facility] for Infectious Disease  Total days of antibiotics 3        Day 3 piptazo        Day 3 vanco               Reason for Consult: MRSA bacteremia and right hand abscess   Referring Physician: nettey  Principal Problem:   Cellulitis of right hand Active Problems:   Community acquired pneumonia of right lower lobe of lung ([place])   Essential hypertension   MRSA bacteremia   Abscess of hand, right   Bipolar affective disorder ([place])    HPI: [name] is a 55 y.o. female with bipolar schizophrenia, chronic hepatitis c GT 1a who was admitted for worsening pain, erythema associated with right hand abscess. Which she reports was progressively getting worse over the last 3 days. She was seen by dr Fredna Dow, who recommended I x D. She was admitted on 11/19 found to have WBC of 11.5K, she was started empirically on vanco and piptazo while infectious work up started. Blood cx growing MRSA. Seh was taken to the OR on 11/20 for debridement and OR cultures + moderate staph aureus. Her abtx have been narrowed to vancomycin. Also has positive tox screen for amphetamins and cocaine on admit. She also reports having worsening cough with pleuretic chest pain component. She has had ongoing nasal congestion and drainage. CXR on admit shows RLL infiltrate per my review.  In regards to her past med hx, she appears that she may have been treated for hep C since she had undetectable viral load up from April til august 2013, but detectable in sep 2013.   Past Medical History:  Diagnosis Date  . Anxiety   . Bipolar affective disorder ([place])    ADHD AND SCHZIOPHRENIC--PT GOES TO [place]. [facility]  . Blood transfusion without reported diagnosis   . Cervical radiculopathy   . Chronic pain syndrome   . Complication of anesthesia    STATES SHE WOKE UP TWICE DURING SURGERIES  . DJD (degenerative joint disease)   . GERD (gastroesophageal reflux disease)   . H/O hiatal hernia   .  Hepatitis C infection    STATES COMPLETED TREATMENT AND NO LONGER HAS INFECTION  . Hypertension   . Leukocytopenia   . Osteoarthritis   . Osteoporosis   . Peptic ulcer disease   . Pneumonia 2013  . Polysubstance (excluding opioids) dependence ([place])   . Seizures ([facility])    SEIZURE AFTER BREATHING TX (ALBUTEROL) IN ER; ALSO PAST HX SEIZURES DURING DRUG DETOX  . Shortness of breath    WITH EXERTION; SMOKER  . Thrombocytopenia ([place])     Allergies:  Allergies  Allergen Reactions  . Albuterol Other (See Comments)    PT states she had seizures from an albuterol treatment and was hospitalized for it  . Aspirin Hives    Vomits blood  . Adhesive [Tape] Rash    MEDICATIONS: . enoxaparin (LOVENOX) injection  40 mg Subcutaneous Q24H  . Influenza vac split quadrivalent PF  0.5 mL Intramuscular Tomorrow-1000  . lidocaine  1 patch Transdermal Q24H  . lisinopril  5 mg Oral Daily  . piperacillin-tazobactam (ZOSYN)  IV  3.375 g Intravenous Q8H  . polyethylene glycol  17 g Oral BID  . [START ON 04/16/2016] vancomycin  1,000 mg Intravenous Q12H  . vitamin C  1,000 mg Oral Daily    Social History  Substance Use Topics  . Smoking status:  Current Some Day Smoker    Packs/day: 0.50    Years: 20.00    Types: Cigarettes  . Smokeless tobacco: Never Used  . Alcohol use No     Comment: HX OF ABUSE OF PAIN MEDS--PT IS CURRENTLY IN TREATMENT AT [facility] IN [place]    Family History  Problem Relation Age of Onset  . Breast cancer Sister   . Cancer Sister   . Heart disease Mother   . Heart disease Father     Review of Systems -  As mentioned in hpi  Constitutional: positive for fever, chills, diaphoresis, activity change, appetite change, fatigue and unexpected weight change.  HENT: Negative for congestion, sore throat, rhinorrhea, sneezing, trouble swallowing and sinus pressure.  Eyes: Negative for photophobia and visual disturbance.  Respiratory: positive for  productive cough, chest tightness, shortness of breath, wheezing and stridor.  Cardiovascular: Negative for chest pain, palpitations and leg swelling.  Gastrointestinal: Negative for nausea, vomiting, abdominal pain, diarrhea, constipation, blood in stool, abdominal distention and anal bleeding.  Genitourinary: Negative for dysuria, hematuria, flank pain and difficulty urinating.  Musculoskeletal: Negative for myalgias, back pain, joint swelling, arthralgias and gait problem.  Skin: positive right had wound Neurological: Negative for dizziness, tremors, weakness and light-headedness.  Hematological: Negative for adenopathy. Does not bruise/bleed easily.  Psychiatric/Behavioral: Negative for behavioral problems, confusion, sleep disturbance, dysphoric mood, decreased concentration and agitation.     OBJECTIVE: Temp:  [97.7 F (36.5 C)-100 F (37.8 C)] 98.7 F (37.1 C) (11/21 0531) Pulse Rate:  [96-111] 99 (11/21 0531) Resp:  [18-20] 19 (11/21 0531) BP: (94-117)/(48-63) 107/48 (11/21 0531) SpO2:  [90 %-97 %] 91 % (11/21 0531) Physical Exam  Constitutional:  oriented to person, place, and time. appears older than stated age, disheveled. No distress.  HENT: [place]/AT, PERRLA, no scleral icterus Mouth/Throat: Oropharynx is clear and moist. No oropharyngeal exudate.  Cardiovascular: Normal rate, regular rhythm and normal heart sounds. Exam reveals no gallop and no friction rub.  No murmur heard.  Pulmonary/Chest: Effort normal and breath sounds normal. No respiratory distress.  Mild rhonchi Neck = supple, no nuchal rigidity Abdominal: Soft. Bowel sounds are normal.  exhibits no distension. There is no tenderness.  Lymphadenopathy: no cervical adenopathy. No axillary adenopathy Neurological: alert and oriented to person, place, and time.  Skin: right hand is wrapped from surgery. She has scattered abrasion to left arm Psychiatric: a normal mood and affect.  behavior is normal.     LABS: Results for orders placed or performed during the hospital encounter of 04/13/16 (from the past 48 hour(s))  CBC with Differential/Platelet     Status: Abnormal   Collection Time: 04/14/16  1:52 AM  Result Value Ref Range   WBC 11.5 (H) 4.0 - 10.5 K/uL   RBC 4.21 3.87 - 5.11 MIL/uL   Hemoglobin 13.0 12.0 - 15.0 g/dL   HCT 38.4 36.0 - 46.0 %   MCV 91.2 78.0 - 100.0 fL   MCH 30.9 26.0 - 34.0 pg   MCHC 33.9 30.0 - 36.0 g/dL   RDW 14.1 11.5 - 15.5 %   Platelets 114 (L) 150 - 400 K/uL    Comment: RESULT REPEATED AND VERIFIED SPECIMEN CHECKED FOR CLOTS PLATELET COUNT CONFIRMED BY SMEAR    Neutrophils Relative % 79 %   Neutro Abs 9.1 (H) 1.7 - 7.7 K/uL   Lymphocytes Relative 11 %   Lymphs Abs 1.3 0.7 - 4.0 K/uL   Monocytes Relative 9 %   Monocytes Absolute 1.1 (  H) 0.1 - 1.0 K/uL   Eosinophils Relative 0 %   Eosinophils Absolute 0.0 0.0 - 0.7 K/uL   Basophils Relative 0 %   Basophils Absolute 0.0 0.0 - 0.1 K/uL  Blood culture (routine x 2)     Status: None (Preliminary result)   Collection Time: 04/14/16  1:52 AM  Result Value Ref Range   Specimen Description BLOOD BLOOD LEFT HAND    Special Requests BOTTLES DRAWN AEROBIC ONLY 4ML    Culture      NO GROWTH 1 DAY Performed at [facility]    Report Status PENDING   Blood culture (routine x 2)     Status: None (Preliminary result)   Collection Time: 04/14/16  1:52 AM  Result Value Ref Range   Specimen Description BLOOD BLOOD LEFT FOREARM    Special Requests IN PEDIATRIC BOTTLE 0.5 ML    Culture  Setup Time      GRAM POSITIVE COCCI IN CLUSTERS IN PEDIATRIC BOTTLE Organism ID to follow V BRYK,PHARMD _0  04/15/16 MKELLY,MLT    Culture      NO GROWTH 1 DAY Performed at [facility]    Report Status PENDING   Blood Culture ID Panel (Reflexed)     Status: Abnormal   Collection Time: 04/14/16  1:52 AM  Result Value Ref Range   Enterococcus species NOT DETECTED NOT DETECTED   Listeria monocytogenes  NOT DETECTED NOT DETECTED   Staphylococcus species DETECTED (A) NOT DETECTED    Comment: CRITICAL RESULT CALLED TO, READ BACK BY AND VERIFIED WITH: V BRYK,PHARMD _1  04/15/16 MKELLY,MLT    Staphylococcus aureus DETECTED (A) NOT DETECTED    Comment: CRITICAL RESULT CALLED TO, READ BACK BY AND VERIFIED WITH: V BRYK, PHARMD _2  04/15/16 MKELLY,MLT    Methicillin resistance DETECTED (A) NOT DETECTED    Comment: CRITICAL RESULT CALLED TO, READ BACK BY AND VERIFIED WITH: V BRYK,PHARMD _3  04/15/16 MKELLY,MLT    Streptococcus species NOT DETECTED NOT DETECTED   Streptococcus agalactiae NOT DETECTED NOT DETECTED   Streptococcus pneumoniae NOT DETECTED NOT DETECTED   Streptococcus pyogenes NOT DETECTED NOT DETECTED   Acinetobacter baumannii NOT DETECTED NOT DETECTED   Enterobacteriaceae species NOT DETECTED NOT DETECTED   Enterobacter cloacae complex NOT DETECTED NOT DETECTED   Escherichia coli NOT DETECTED NOT DETECTED   Klebsiella oxytoca NOT DETECTED NOT DETECTED   Klebsiella pneumoniae NOT DETECTED NOT DETECTED   Proteus species NOT DETECTED NOT DETECTED   Serratia marcescens NOT DETECTED NOT DETECTED   Haemophilus influenzae NOT DETECTED NOT DETECTED   Neisseria meningitidis NOT DETECTED NOT DETECTED   Pseudomonas aeruginosa NOT DETECTED NOT DETECTED   Candida albicans NOT DETECTED NOT DETECTED   Candida glabrata NOT DETECTED NOT DETECTED   Candida krusei NOT DETECTED NOT DETECTED   Candida parapsilosis NOT DETECTED NOT DETECTED   Candida tropicalis NOT DETECTED NOT DETECTED    Comment: Performed at [facility], ED     Status: Abnormal   Collection Time: 04/14/16  3:05 AM  Result Value Ref Range   Sodium 134 (L) 135 - 145 mmol/L   Potassium 3.1 (L) 3.5 - 5.1 mmol/L   Chloride 97 (L) 101 - 111 mmol/L   BUN 9 6 - 20 mg/dL   Creatinine, Ser 0.70 0.44 - 1.00 mg/dL   Glucose, Bld 141 (H) 65 - 99 mg/dL   Calcium, Ion 1.11 (L) 1.15 - 1.40 mmol/L   TCO2 24  0 - 100 mmol/L  Hemoglobin 12.2 12.0 - 15.0 g/dL   HCT 36.0 36.0 - 46.0 %  Aerobic/Anaerobic Culture (surgical/deep wound)     Status: None (Preliminary result)   Collection Time: 04/14/16  9:00 AM  Result Value Ref Range   Specimen Description ABSCESS RIGHT HAND    Special Requests NONE    Gram Stain      FEW WBC PRESENT,BOTH PMN AND MONONUCLEAR FEW GRAM POSITIVE COCCI IN PAIRS IN SINGLES FEW GRAM POSITIVE COCCI IN CLUSTERS    Culture MODERATE STAPHYLOCOCCUS AUREUS    Report Status PENDING   Rapid urine drug screen (hospital performed)     Status: Abnormal   Collection Time: 04/14/16  9:30 AM  Result Value Ref Range   Opiates NONE DETECTED NONE DETECTED   Cocaine POSITIVE (A) NONE DETECTED   Benzodiazepines NONE DETECTED NONE DETECTED   Amphetamines POSITIVE (A) NONE DETECTED   Tetrahydrocannabinol NONE DETECTED NONE DETECTED   Barbiturates NONE DETECTED NONE DETECTED    Comment:        DRUG SCREEN FOR MEDICAL PURPOSES ONLY.  IF CONFIRMATION IS NEEDED FOR ANY PURPOSE, NOTIFY LAB WITHIN 5 DAYS.        LOWEST DETECTABLE LIMITS FOR URINE DRUG SCREEN Drug Class       Cutoff (ng/mL) Amphetamine      1000 Barbiturate      200 Benzodiazepine   761 Tricyclics       950 Opiates          300 Cocaine          300 THC              50   CBC     Status: Abnormal   Collection Time: 04/14/16 10:47 AM  Result Value Ref Range   WBC 7.9 4.0 - 10.5 K/uL   RBC 3.67 (L) 3.87 - 5.11 MIL/uL   Hemoglobin 11.2 (L) 12.0 - 15.0 g/dL   HCT 33.2 (L) 36.0 - 46.0 %   MCV 90.5 78.0 - 100.0 fL   MCH 30.5 26.0 - 34.0 pg   MCHC 33.7 30.0 - 36.0 g/dL   RDW 14.0 11.5 - 15.5 %   Platelets 93 (L) 150 - 400 K/uL    Comment: REPEATED TO VERIFY PLATELET COUNT CONFIRMED BY SMEAR   Creatinine, serum     Status: None   Collection Time: 04/14/16 10:47 AM  Result Value Ref Range   Creatinine, Ser 0.96 0.44 - 1.00 mg/dL   GFR calc non Af Amer >60 >60 mL/min   GFR calc Af Amer >60 >60 mL/min     Comment: (NOTE) The eGFR has been calculated using the CKD EPI equation. This calculation has not been validated in all clinical situations. eGFR's persistently <60 mL/min signify possible Chronic Kidney Disease.   CBC     Status: Abnormal   Collection Time: 04/15/16  4:44 AM  Result Value Ref Range   WBC 5.9 4.0 - 10.5 K/uL   RBC 3.60 (L) 3.87 - 5.11 MIL/uL   Hemoglobin 10.9 (L) 12.0 - 15.0 g/dL   HCT 33.2 (L) 36.0 - 46.0 %   MCV 92.2 78.0 - 100.0 fL   MCH 30.3 26.0 - 34.0 pg   MCHC 32.8 30.0 - 36.0 g/dL   RDW 14.2 11.5 - 15.5 %   Platelets 102 (L) 150 - 400 K/uL    Comment: CONSISTENT WITH PREVIOUS RESULT  Vancomycin, trough     Status: Abnormal   Collection Time: 04/15/16 12:25 PM  Result  Value Ref Range   Vancomycin Tr 10 (L) 15 - 20 ug/mL    MICRO: 11/20 blood cx Staph aureus 11/20 wound cx staph aureus 11/21 repeat blood cx IMAGING: Dg Chest 2 View  Result Date: 04/14/2016 CLINICAL DATA:  Left arm swelling since yesterday. The irritated sleeve bites on the hands. Skin abscess. Swelling in the hand and arm. Redness. Right lower rib cage pain after coughing yesterday. EXAM: CHEST  2 VIEW COMPARISON:  10/06/2015 FINDINGS: There is focal area of increased density in the right lung base laterally which may indicate focal pneumonia. Left lung is clear. Normal heart size and pulmonary vascularity. No blunting of costophrenic angles. No pneumothorax. Mediastinal contours appear intact. Calcified aorta. Esophageal hiatal hernia behind the heart. Postoperative changes in the cervical spine. IMPRESSION: Small focal area of infiltration in the right lung base may indicate focal pneumonia. Electronically Signed   By: Lucienne Capers M.D.   On: 04/14/2016 00:59   Dg Hand Complete Right  Result Date: 04/14/2016 CLINICAL DATA:  Left arm swelling.  Reported flea bites. EXAM: RIGHT HAND - COMPLETE 3+ VIEW COMPARISON:  None. FINDINGS: There is no evidence of fracture or dislocation. There  is no evidence of arthropathy or other focal bone abnormality. Soft tissues are unremarkable. IMPRESSION: No focal osseous abnormality. Electronically Signed   By: Ulyses Jarred M.D.   On: 04/14/2016 00:59   Assessment/Plan:  55yo F with bipolar/schizophrenia/illicit drug use admitted for MRSA hand abscess with secondary bacteremia and pneumonia.  - continue with vancomycin, await repeat blood cultures to see if persistent or if she is clearing infection - recommend to get TTE  - pneumonia = recommend to get ur legionella and ur strep pneumo antigen  - chronic hep c without hepatic coma = will check viral load and hiv test  Will provide further recs tomorrow

## 2016-04-15 NOTE — Progress Notes (Addendum)
PROGRESS NOTE    [name]  [id number] DOB: 1961-03-29 DOA: 04/13/2016 PCP: [facility]   Brief Narrative: [name] is a 55 y.o. female with medical history significant of IVDU, chronic pain, bipolar disorder, ADHD, anxiety, polysubstance abuse. She presented with hand swelling, found to have an abscess. She is s/p ORIF but found to have likely MRSA bacteremia (1/2 bottles positive for gram positive staph so far)   Assessment & Plan:   Principal Problem:   Cellulitis of right hand Active Problems:   Community acquired pneumonia of right lower lobe of lung ([place])   Cellulitis of right hand Right hand abscess S/p I&D in the OR by Dr. Fredna Dow. Still on antibiotics for below problems.  Community acquired pneumonia Possibly MRSA with ?positive blood culture (1/2 bottles so far). No sputum. Patient with associated tachycardia, which has improved, chest pain and is on room air. PE on differential however, Wells Score: 1.5 (for tachycardia which has now improved). -continue vancomycin and zosyn  Bacteremia, appears secondary to MRSA 1/2 bottles significant for gram positive cocci and ID panel significant for MRSA. This is possibly secondary to CAP vs abscess vs IV drug abuse -continue vancomycin -infectious disease to follow-up with recommendations -echocardiogram  IV drug use Patient has a remote history of amphetamine use via IV. She admits to using methamphetamines via IV in addition to Seroquel via IV a few days ago. UDS significant for amphetamines and cocaine.  Bipolar disorder -continue Seroquel  Hypertension -continue lisinopril   DVT prophylaxis: Lovenox Code Status: Full code Family Communication: None at bedside Disposition Plan: Anticipate eventual discharge home after bacteremia workup   Consultants:   Hand surgery  Infectious disease  Procedures:   I&D of right hand abscess (11/20)  Antimicrobials:   Vancomycin (11/20>>  Zosyn  (11/20>>    Subjective: Patient reports right sided chest pain. No O2 requirements  Objective: Vitals:   04/14/16 2121 04/14/16 2235 04/14/16 2300 04/15/16 0531  BP:   117/63 (!) 107/48  Pulse:   (!) 111 99  Resp:   20 19  Temp: 100 F (37.8 C) 99 F (37.2 C) 99.3 F (37.4 C) 98.7 F (37.1 C)  TempSrc: Oral Oral Oral Oral  SpO2:   90% 91%  Weight:      Height:        Intake/Output Summary (Last 24 hours) at 04/15/16 1034 Last data filed at 04/15/16 0535  Gross per 24 hour  Intake              970 ml  Output                1 ml  Net              969 ml   Filed Weights   04/13/16 2354 04/14/16 0547  Weight: 72.6 kg (160 lb) 72.5 kg (159 lb 13.3 oz)    Examination:  General exam: Appears calm and comfortable Respiratory system: diminished breath sounds at right lower and mid lung. Clear everywhere else. Respiratory effort normal. Cardiovascular system: S1 & S2 heard, RRR. No murmurs, rubs, gallops or clicks. Gastrointestinal system: Abdomen is nondistended, soft and nontender. Normal bowel sounds heard. Central nervous system: Alert and oriented. No focal neurological deficits. Extremities: No edema. No calf tenderness Skin: No cyanosis. No rashes Psychiatry: Judgement and insight appear normal. Mood & affect appropriate.     Data Reviewed: I have personally reviewed following labs and imaging studies  CBC:  Recent  Labs Lab 04/14/16 0152 04/14/16 0305 04/14/16 1047 04/15/16 0444  WBC 11.5*  --  7.9 5.9  NEUTROABS 9.1*  --   --   --   HGB 13.0 12.2 11.2* 10.9*  HCT 38.4 36.0 33.2* 33.2*  MCV 91.2  --  90.5 92.2  PLT 114*  --  93* [id number]*   Basic Metabolic Panel:  Recent Labs Lab 04/14/16 0305 04/14/16 1047  NA 134*  --   K 3.1*  --   CL 97*  --   GLUCOSE 141*  --   BUN 9  --   CREATININE 0.70 0.96   GFR: Estimated Creatinine Clearance: 64.4 mL/min (by C-G formula based on SCr of 0.96 mg/dL). Liver Function Tests: No results for input(s): AST,  ALT, ALKPHOS, BILITOT, PROT, ALBUMIN in the last 168 hours. No results for input(s): LIPASE, AMYLASE in the last 168 hours. No results for input(s): AMMONIA in the last 168 hours. Coagulation Profile: No results for input(s): INR, PROTIME in the last 168 hours. Cardiac Enzymes: No results for input(s): CKTOTAL, CKMB, CKMBINDEX, TROPONINI in the last 168 hours. BNP (last 3 results) No results for input(s): PROBNP in the last 8760 hours. HbA1C: No results for input(s): HGBA1C in the last 72 hours. CBG: No results for input(s): GLUCAP in the last 168 hours. Lipid Profile: No results for input(s): CHOL, HDL, LDLCALC, TRIG, CHOLHDL, LDLDIRECT in the last 72 hours. Thyroid Function Tests: No results for input(s): TSH, T4TOTAL, FREET4, T3FREE, THYROIDAB in the last 72 hours. Anemia Panel: No results for input(s): VITAMINB12, FOLATE, FERRITIN, TIBC, IRON, RETICCTPCT in the last 72 hours. Sepsis Labs: No results for input(s): PROCALCITON, LATICACIDVEN in the last 168 hours.  Recent Results (from the past 240 hour(s))  Blood culture (routine x 2)     Status: None (Preliminary result)   Collection Time: 04/14/16  1:52 AM  Result Value Ref Range Status   Specimen Description BLOOD BLOOD LEFT HAND  Final   Special Requests BOTTLES DRAWN AEROBIC ONLY 4ML  Final   Culture   Final    NO GROWTH 1 DAY Performed at [facility]    Report Status PENDING  Incomplete  Blood culture (routine x 2)     Status: None (Preliminary result)   Collection Time: 04/14/16  1:52 AM  Result Value Ref Range Status   Specimen Description BLOOD BLOOD LEFT FOREARM  Final   Special Requests IN PEDIATRIC BOTTLE 0.5 ML  Final   Culture  Setup Time   Final    GRAM POSITIVE COCCI IN CLUSTERS IN PEDIATRIC BOTTLE Organism ID to follow V BRYK,PHARMD @0534  04/15/16 MKELLY,MLT    Culture   Final    NO GROWTH 1 DAY Performed at [facility]    Report Status PENDING  Incomplete  Blood Culture ID Panel  (Reflexed)     Status: Abnormal   Collection Time: 04/14/16  1:52 AM  Result Value Ref Range Status   Enterococcus species NOT DETECTED NOT DETECTED Final   Listeria monocytogenes NOT DETECTED NOT DETECTED Final   Staphylococcus species DETECTED (A) NOT DETECTED Final    Comment: CRITICAL RESULT CALLED TO, READ BACK BY AND VERIFIED WITH: V BRYK,PHARMD @0534  04/15/16 MKELLY,MLT    Staphylococcus aureus DETECTED (A) NOT DETECTED Final    Comment: CRITICAL RESULT CALLED TO, READ BACK BY AND VERIFIED WITH: V BRYK, PHARMD @0534  04/15/16 MKELLY,MLT    Methicillin resistance DETECTED (A) NOT DETECTED Final    Comment: CRITICAL RESULT CALLED TO, READ  BACK BY AND VERIFIED WITH: V BRYK,PHARMD @0534  04/15/16 MKELLY,MLT    Streptococcus species NOT DETECTED NOT DETECTED Final   Streptococcus agalactiae NOT DETECTED NOT DETECTED Final   Streptococcus pneumoniae NOT DETECTED NOT DETECTED Final   Streptococcus pyogenes NOT DETECTED NOT DETECTED Final   Acinetobacter baumannii NOT DETECTED NOT DETECTED Final   Enterobacteriaceae species NOT DETECTED NOT DETECTED Final   Enterobacter cloacae complex NOT DETECTED NOT DETECTED Final   Escherichia coli NOT DETECTED NOT DETECTED Final   Klebsiella oxytoca NOT DETECTED NOT DETECTED Final   Klebsiella pneumoniae NOT DETECTED NOT DETECTED Final   Proteus species NOT DETECTED NOT DETECTED Final   Serratia marcescens NOT DETECTED NOT DETECTED Final   Haemophilus influenzae NOT DETECTED NOT DETECTED Final   Neisseria meningitidis NOT DETECTED NOT DETECTED Final   Pseudomonas aeruginosa NOT DETECTED NOT DETECTED Final   Candida albicans NOT DETECTED NOT DETECTED Final   Candida glabrata NOT DETECTED NOT DETECTED Final   Candida krusei NOT DETECTED NOT DETECTED Final   Candida parapsilosis NOT DETECTED NOT DETECTED Final   Candida tropicalis NOT DETECTED NOT DETECTED Final    Comment: Performed at [facility]  Aerobic/Anaerobic Culture  (surgical/deep wound)     Status: None (Preliminary result)   Collection Time: 04/14/16  9:00 AM  Result Value Ref Range Status   Specimen Description ABSCESS RIGHT HAND  Final   Special Requests NONE  Final   Gram Stain   Final    FEW WBC PRESENT,BOTH PMN AND MONONUCLEAR FEW GRAM POSITIVE COCCI IN PAIRS IN SINGLES FEW GRAM POSITIVE COCCI IN CLUSTERS    Culture MODERATE STAPHYLOCOCCUS AUREUS  Final   Report Status PENDING  Incomplete         Radiology Studies: Dg Chest 2 View  Result Date: 04/14/2016 CLINICAL DATA:  Left arm swelling since yesterday. The irritated sleeve bites on the hands. Skin abscess. Swelling in the hand and arm. Redness. Right lower rib cage pain after coughing yesterday. EXAM: CHEST  2 VIEW COMPARISON:  10/06/2015 FINDINGS: There is focal area of increased density in the right lung base laterally which may indicate focal pneumonia. Left lung is clear. Normal heart size and pulmonary vascularity. No blunting of costophrenic angles. No pneumothorax. Mediastinal contours appear intact. Calcified aorta. Esophageal hiatal hernia behind the heart. Postoperative changes in the cervical spine. IMPRESSION: Small focal area of infiltration in the right lung base may indicate focal pneumonia. Electronically Signed   By: Lucienne Capers M.D.   On: 04/14/2016 00:59   Dg Hand Complete Right  Result Date: 04/14/2016 CLINICAL DATA:  Left arm swelling.  Reported flea bites. EXAM: RIGHT HAND - COMPLETE 3+ VIEW COMPARISON:  None. FINDINGS: There is no evidence of fracture or dislocation. There is no evidence of arthropathy or other focal bone abnormality. Soft tissues are unremarkable. IMPRESSION: No focal osseous abnormality. Electronically Signed   By: Ulyses Jarred M.D.   On: 04/14/2016 00:59        Scheduled Meds: . enoxaparin (LOVENOX) injection  40 mg Subcutaneous Q24H  . Influenza vac split quadrivalent PF  0.5 mL Intramuscular Tomorrow-1000  . lidocaine  1 patch  Transdermal Q24H  . lisinopril  5 mg Oral Daily  . piperacillin-tazobactam (ZOSYN)  IV  3.375 g Intravenous Q8H  . polyethylene glycol  17 g Oral BID  . vancomycin  750 mg Intravenous Q12H  . vitamin C  1,000 mg Oral Daily   Continuous Infusions: . lactated ringers Stopped (04/14/16 0912)  .  lactated ringers 75 mL/hr at 04/14/16 1100     LOS: 1 day     Cordelia Poche [facility] 04/15/2016, 10:34 AM Pager: [phone]  If 7PM-7AM, please contact night-coverage [email or web address] Password [facility] 04/15/2016, 10:34 AM

## 2016-04-15 NOTE — Progress Notes (Signed)
PHARMACY - PHYSICIAN COMMUNICATION CRITICAL VALUE ALERT - BLOOD CULTURE IDENTIFICATION (BCID)  Results for orders placed or performed during the hospital encounter of 04/13/16  Blood Culture ID Panel (Reflexed) (Collected: 04/14/2016  1:52 AM)  Result Value Ref Range   Enterococcus species NOT DETECTED NOT DETECTED   Listeria monocytogenes NOT DETECTED NOT DETECTED   Staphylococcus species DETECTED (A) NOT DETECTED   Staphylococcus aureus DETECTED (A) NOT DETECTED   Methicillin resistance DETECTED (A) NOT DETECTED   Streptococcus species NOT DETECTED NOT DETECTED   Streptococcus agalactiae NOT DETECTED NOT DETECTED   Streptococcus pneumoniae NOT DETECTED NOT DETECTED   Streptococcus pyogenes NOT DETECTED NOT DETECTED   Acinetobacter baumannii NOT DETECTED NOT DETECTED   Enterobacteriaceae species NOT DETECTED NOT DETECTED   Enterobacter cloacae complex NOT DETECTED NOT DETECTED   Escherichia coli NOT DETECTED NOT DETECTED   Klebsiella oxytoca NOT DETECTED NOT DETECTED   Klebsiella pneumoniae NOT DETECTED NOT DETECTED   Proteus species NOT DETECTED NOT DETECTED   Serratia marcescens NOT DETECTED NOT DETECTED   Haemophilus influenzae NOT DETECTED NOT DETECTED   Neisseria meningitidis NOT DETECTED NOT DETECTED   Pseudomonas aeruginosa NOT DETECTED NOT DETECTED   Candida albicans NOT DETECTED NOT DETECTED   Candida glabrata NOT DETECTED NOT DETECTED   Candida krusei NOT DETECTED NOT DETECTED   Candida parapsilosis NOT DETECTED NOT DETECTED   Candida tropicalis NOT DETECTED NOT DETECTED    Name of physician (or Provider) Contacted: Chaney Malling via text page  Changes to prescribed antibiotics required: Already on vanc for cellulitis, will increase goal trough to 15-20.  Wynona Neat, PharmD, BCPS  04/15/2016  5:41 AM

## 2016-04-15 NOTE — Progress Notes (Signed)
Initial Nutrition Assessment  INTERVENTION:  Provide Ensure Enlive po BID, each supplement provides 350 kcal and 20 grams of protein Provide Multivitamin with minerals daily   NUTRITION DIAGNOSIS:   Unintentional weight loss related to chronic illness as evidenced by percent weight loss.   GOAL:   Patient will meet greater than or equal to 90% of their needs   MONITOR:   PO intake, Supplement acceptance, Weight trends, Labs, Skin, I & O's  REASON FOR ASSESSMENT:   Malnutrition Screening Tool    ASSESSMENT:   55 y.o. female with medical history significant of IVDU, chronic pain, bipolar disorder, ADHD, anxiety, polysubstance abuse. She presented with hand swelling, found to have an abscess. She is s/p ORIF but found to have likely MRSA bacteremia   Pt states that she has been losing weight over the past year despite eating well/normally. She reports losing from 207 lbs to current weight in the past year. Weight history shows 9% weight loss in the past 6 months and weight loss from 181 lbs to current weight in less than one year. Pt has mild muscle wasting per nutrition-focused physical exam. Pt states that for the past couple days she has not been eating well due tp having CAP and chest pain. Lunch tray at bedside with 0% consumed. Pt is agreeable to trying Ensure supplements.  Labs: low hemoglobin  Diet Order:  Diet regular Room service appropriate? Yes; Fluid consistency: Thin  Skin:  Wound (see comment) (closed incision on right arm)  Last BM:  11/19  Height:   Ht Readings from Last 1 Encounters:  04/14/16 5\' 7"  (1.702 m)    Weight:   Wt Readings from Last 1 Encounters:  04/14/16 159 lb 13.3 oz (72.5 kg)    Ideal Body Weight:  61.36 kg  BMI:  Body mass index is 25.03 kg/m.  Estimated Nutritional Needs:   Kcal:  1800-2000  Protein:  80-90 grams  Fluid:  2 L/day  EDUCATION NEEDS:   No education needs identified at this time  Scarlette Ar RD,  CSP, LDN Inpatient Clinical Dietitian Pager: [phone] After Hours Pager: [phone]

## 2016-04-16 ENCOUNTER — Inpatient Hospital Stay (HOSPITAL_COMMUNITY): Payer: Medicare Other

## 2016-04-16 DIAGNOSIS — J181 Lobar pneumonia, unspecified organism: Secondary | ICD-10-CM

## 2016-04-16 DIAGNOSIS — R7881 Bacteremia: Secondary | ICD-10-CM

## 2016-04-16 LAB — BASIC METABOLIC PANEL
Anion gap: 7 (ref 5–15)
BUN: 16 mg/dL (ref 6–20)
CALCIUM: 8.6 mg/dL — AB (ref 8.9–10.3)
CO2: 25 mmol/L (ref 22–32)
Chloride: 101 mmol/L (ref 101–111)
Creatinine, Ser: 0.81 mg/dL (ref 0.44–1.00)
GFR calc Af Amer: >60 mL/min (ref >60)
Glucose, Bld: 116 mg/dL — ABNORMAL HIGH (ref 65–99)
POTASSIUM: 4.2 mmol/L (ref 3.5–5.1)
SODIUM: 133 mmol/L — AB (ref 135–145)

## 2016-04-16 LAB — CBC
HCT: 32.8 % — ABNORMAL LOW (ref 36.0–46.0)
HCT: 33.6 % — ABNORMAL LOW (ref 36.0–46.0)
HCT: 30.5 % — ABNORMAL LOW (ref 36.0–46.0)
HEMOGLOBIN: 10 g/dL — AB (ref 12.0–15.0)
Hemoglobin: 10.9 g/dL — ABNORMAL LOW (ref 12.0–15.0)
Hemoglobin: 11.2 g/dL — ABNORMAL LOW (ref 12.0–15.0)
MCH: 30.5 pg (ref 26.0–34.0)
MCH: 30.4 pg (ref 26.0–34.0)
MCH: 30.1 pg (ref 26.0–34.0)
MCHC: 33.2 g/dL (ref 30.0–36.0)
MCHC: 33.3 g/dL (ref 30.0–36.0)
MCHC: 32.8 g/dL (ref 30.0–36.0)
MCV: 91.9 fL (ref 78.0–100.0)
MCV: 91.3 fL (ref 78.0–100.0)
MCV: 91.9 fL (ref 78.0–100.0)
PLATELETS: 126 10*3/uL — AB (ref 150–400)
PLATELETS: 122 10*3/uL — AB (ref 150–400)
PLATELETS: 127 10*3/uL — AB (ref 150–400)
RBC: 3.57 MIL/uL — AB (ref 3.87–5.11)
RBC: 3.68 MIL/uL — ABNORMAL LOW (ref 3.87–5.11)
RBC: 3.32 MIL/uL — AB (ref 3.87–5.11)
RDW: 14.1 % (ref 11.5–15.5)
RDW: 14 % (ref 11.5–15.5)
RDW: 14.1 % (ref 11.5–15.5)
WBC: 6.4 10*3/uL (ref 4.0–10.5)
WBC: 6.9 10*3/uL (ref 4.0–10.5)
WBC: 5.6 10*3/uL (ref 4.0–10.5)

## 2016-04-16 LAB — HCV RNA QUANT
HCV Quantitative Log: 6.364 log10 IU/mL (ref >1.70)
HCV Quantitative: 2310000 IU/mL (ref >50)

## 2016-04-16 LAB — HIV ANTIBODY (ROUTINE TESTING W REFLEX): HIV SCREEN 4TH GENERATION: NONREACTIVE

## 2016-04-16 LAB — PROTIME-INR
INR: 1.2
PROTHROMBIN TIME: 15.2 s (ref 11.4–15.2)

## 2016-04-16 LAB — EXPECTORATED SPUTUM ASSESSMENT W REFEX TO RESP CULTURE

## 2016-04-16 LAB — TYPE AND SCREEN
ABO/RH(D): A POS
Antibody Screen: NEGATIVE

## 2016-04-16 LAB — STREP PNEUMONIAE URINARY ANTIGEN: Strep Pneumo Urinary Antigen: NEGATIVE

## 2016-04-16 LAB — OCCULT BLOOD X 1 CARD TO LAB, STOOL: FECAL OCCULT BLD: POSITIVE — AB

## 2016-04-16 LAB — EXPECTORATED SPUTUM ASSESSMENT W GRAM STAIN, RFLX TO RESP C

## 2016-04-16 MED ORDER — PANTOPRAZOLE SODIUM 40 MG IV SOLR
40.0000 mg | Freq: Two times a day (BID) | INTRAVENOUS | Status: DC
  Administered 2016-04-16 – 2016-04-18 (×5): 40 mg via INTRAVENOUS
  Filled 2016-04-16 (×5): qty 40

## 2016-04-16 NOTE — Progress Notes (Addendum)
Physical Therapy Wound Treatment Patient Details  Name: [name] MRN: [id number] Date of Birth: 1961/02/21  Today's Date: 04/16/2016 Time: 4098-1191 Time Calculation (min): 39 min  Subjective  Subjective: Pt ask how long this will take to heal. Patient and Family Stated Goals: get hand better Date of Onset:  (few days ago) Prior Treatments: Surgical I&D  Pain Score: Pain Score: 8. Pt premedicated and reported treatment not too bad.  Wound Assessment  Wound / Incision (Open or Dehisced) 04/16/16 Incision - Open Hand Right;Posterior;Lateral thenar eminence (Active)  Dressing Type Gauze (Comment);Compression wrap 04/16/2016  9:00 AM  Dressing Changed Changed 04/16/2016  9:00 AM  Dressing Status Clean;Dry;Intact 04/16/2016  9:00 AM  Dressing Change Frequency Daily 04/16/2016  9:00 AM  Site / Wound Assessment Red;Yellow 04/16/2016  9:00 AM  % Wound base Red or Granulating 95% 04/16/2016  9:00 AM  % Wound base Yellow/Fibrinous Exudate 5% 04/16/2016  9:00 AM  % Wound base Black/Eschar 0% 04/16/2016  9:00 AM  % Wound base Other/Granulation Tissue (Comment) 0% 04/16/2016  9:00 AM  Peri-wound Assessment Erythema (blanchable);Edema 04/16/2016  9:00 AM  Wound Length (cm) 4 cm 04/16/2016  9:00 AM  Wound Width (cm) 0.5 cm 04/16/2016  9:00 AM  Wound Depth (cm) 1.5 cm 04/16/2016  9:00 AM  Margins Unattached edges (unapproximated) 04/16/2016  9:00 AM  Closure None 04/16/2016  9:00 AM  Drainage Amount Moderate 04/16/2016  9:00 AM  Drainage Description Sanguineous 04/16/2016  9:00 AM  Treatment Hydrotherapy (Pulse lavage);Packing (Impregnated strip) 04/16/2016  9:00 AM     Wound / Incision (Open or Dehisced) 04/16/16 Incision - Open Hand Anterior;Lateral;Right thenar eminence (Active)  Dressing Type Compression wrap;Gauze (Comment) 04/16/2016  9:00 AM  Dressing Changed Changed 04/16/2016  9:00 AM  Dressing Status Clean;Dry;Intact 04/16/2016  9:00 AM  Dressing Change Frequency Daily  04/16/2016  9:00 AM  Site / Wound Assessment Red;Yellow 04/16/2016  9:00 AM  % Wound base Red or Granulating 95% 04/16/2016  9:00 AM  % Wound base Yellow/Fibrinous Exudate 5% 04/16/2016  9:00 AM  % Wound base Black/Eschar 0% 04/16/2016  9:00 AM  % Wound base Other/Granulation Tissue (Comment) 0% 04/16/2016  9:00 AM  Peri-wound Assessment Erythema (blanchable);Edema 04/16/2016  9:00 AM  Wound Length (cm) 2.5 cm 04/16/2016  9:00 AM  Wound Width (cm) 1.5 cm 04/16/2016  9:00 AM  Wound Depth (cm) 0.2 cm 04/16/2016  9:00 AM  Undermining (cm) .3 at 8-9 o'clock; .2 at 3-4 oclock 04/16/2016  9:00 AM  Margins Unattached edges (unapproximated) 04/16/2016  9:00 AM  Closure None 04/16/2016  9:00 AM  Drainage Amount Moderate 04/16/2016  9:00 AM  Drainage Description Serosanguineous 04/16/2016  9:00 AM  Treatment Hydrotherapy (Pulse lavage);Packing (Impregnated strip) 04/16/2016  9:00 AM   Hydrotherapy Pulsed lavage therapy - wound location: rt thenar eminence Pulsed Lavage with Suction (psi): 4 psi Pulsed Lavage with Suction - Normal Saline Used: 500 mL Pulsed Lavage Tip: Tip with splash shield   Wound Assessment and Plan  Wound Therapy - Assess/Plan/Recommendations Wound Therapy - Clinical Statement: Pt presents to hydrotherapy with open wounds on rt thenar eminence s/p I&D. Can benefit from hydrotherapy to decrease bioburden, remove necrotic tissue, and promote healing. Instructed in rt hand range of motion. Will see pt again for hydrotherapy on Friday 11/24. Wound Therapy - Functional Problem List: Decr use of rt hand Factors Delaying/Impairing Wound Healing: Infection - systemic/local;Substance abuse;Multiple medical problems Hydrotherapy Plan: Debridement;Dressing change;Patient/family education;Pulsatile lavage with suction Wound Therapy - Frequency: 6X /  week Wound Therapy - Follow Up Recommendations: Other (comment);Home health RN (hand surgeon office vs HH) Wound Plan: See above  Wound  Therapy Goals- Improve the function of patient's integumentary system by progressing the wound(s) through the phases of wound healing (inflammation - proliferation - remodeling) by: Decrease Necrotic Tissue to: 0 Decrease Necrotic Tissue - Progress: Goal set today Increase Granulation Tissue to: 100 Increase Granulation Tissue - Progress: Goal set today Goals/treatment plan/discharge plan were made with and agreed upon by patient/family: Yes Time For Goal Achievement: 7 days Wound Therapy - Potential for Goals: Good  Goals will be updated until maximal potential achieved or discharge criteria met.  Discharge criteria: when goals achieved, discharge from hospital, MD decision/surgical intervention, no progress towards goals, refusal/missing three consecutive treatments without notification or medical reason.  GP      W Maycok 04/16/2016, 10:49 AM   PT [phone]   

## 2016-04-16 NOTE — Progress Notes (Signed)
Notified Attending MD that patient has has multiple black, tarry stools. Sample sent down for fecal occult. Results came back positive and attending MD notified of this. Patient has had around 4 watery, black, tarry bowel movements today.

## 2016-04-16 NOTE — Progress Notes (Signed)
Addendum  Discussed with RN. Reviewed labs. Hemoglobin actually slightly better than earlier this morning. FOBT +. INR normal. Seems like she may just be having diarrhea and not an acute upper GI bleed. In any event we will follow CBC closely and if there is drop in hemoglobin then will consider GI consultation and further evaluation. RN reported that patient informed her that she had an undetermined but small amount of blood when she coughed. Advised to monitor closely and inform us if she has recurrence or worsening.  Vernell Leep, MD, FACP, FHM. [facility] Pager [phone]  If 7PM-7AM, please contact night-coverage [email or web address] Password [facility] 04/16/2016, 6:09 PM

## 2016-04-16 NOTE — Progress Notes (Addendum)
[place] for Infectious Disease    Date of Admission:  04/13/2016   Total days of antibiotics 4        Day 4 piptazo        Day 4 vanco          ID: [name] is a 55 y.o. female with MRSA bacteremia with right hand abscess Principal Problem:   Cellulitis of right hand Active Problems:   Community acquired pneumonia of right lower lobe of lung ([place])   Essential hypertension   MRSA bacteremia   Abscess of hand, right   Bipolar affective disorder ([place])    Subjective: Afebrile, still having productive sputum. Still has ongoing pleuretic chest pain. Patient reports dark stool  cxr per my read shows RLL infiltrate with effusion  Medications:  . feeding supplement (ENSURE ENLIVE)  237 mL Oral BID PC  . Influenza vac split quadrivalent PF  0.5 mL Intramuscular Tomorrow-1000  . lidocaine  1 patch Transdermal Q24H  . lisinopril  5 mg Oral Daily  . multivitamin with minerals  1 tablet Oral Daily  . pantoprazole (PROTONIX) IV  40 mg Intravenous Q12H  . piperacillin-tazobactam (ZOSYN)  IV  3.375 g Intravenous Q8H  . polyethylene glycol  17 g Oral BID  . vancomycin  1,000 mg Intravenous Q12H  . vitamin C  1,000 mg Oral Daily    Objective: Vital signs in last 24 hours: Temp:  [98.7 F (37.1 C)-99 F (37.2 C)] 99 F (37.2 C) (11/22 0500) Pulse Rate:  [97-99] 97 (11/22 0500) Resp:  [16-18] 16 (11/22 0500) BP: (123-139)/(66-70) 123/66 (11/22 0500) SpO2:  [92 %-93 %] 93 % (11/22 0500)  Constitutional:  oriented to person, place, and time. appears older than stated age, disheveled. No distress.  HENT: [place]/AT, PERRLA, no scleral icterus Mouth/Throat: Oropharynx is clear and moist. No oropharyngeal exudate.  Cardiovascular: Normal rate, regular rhythm and normal heart sounds. Exam reveals no gallop and no friction rub.  No murmur heard.  Pulmonary/Chest: Effort normal and breath sounds normal. No respiratory distress.  Mild rhonchi Neck = supple, no nuchal  rigidity Abdominal: Soft. Bowel sounds are normal.  exhibits no distension. There is no tenderness.  Lymphadenopathy: no cervical adenopathy. No axillary adenopathy Neurological: alert and oriented to person, place, and time.  Skin: right hand is wrapped from surgery. She has scattered abrasion to left arm Psychiatric: a normal mood and affect.  behavior is normal.  Lab Results  Recent Labs  04/14/16 0305 04/14/16 1047 04/15/16 0444 04/16/16 0644  WBC  --  7.9 5.9 6.4  HGB 12.2 11.2* 10.9* 10.9*  HCT 36.0 33.2* 33.2* 32.8*  NA 134*  --   --  133*  K 3.1*  --   --  4.2  CL 97*  --   --  101  CO2  --   --   --  25  BUN 9  --   --  16  CREATININE 0.70 0.96  --  0.81    Microbiology: 11/20 blood cx MRSA 11/20 wound cx MRSA 11/21 repeat blood cx Studies/Results: Dg Chest 2 View  Result Date: 04/16/2016 CLINICAL DATA:  Right side chest pain.  Pneumonia. EXAM: CHEST  2 VIEW COMPARISON:  04/14/2016 FINDINGS: Increasing opacity at the right lung base concerning for pneumonia. Small right effusion. No confluent opacity on the left. Heart is normal size. Small hiatal hernia. No acute bony abnormality. IMPRESSION: Increasing right lower lobe airspace opacity with right effusion compatible  with pneumonia. Small hiatal hernia. Electronically Signed   By: Rolm Baptise M.D.   On: 04/16/2016 12:09     Assessment/Plan: 55yo F with bipolar/schizophrenia/illicit drug use admitted for MRSA hand abscess with secondary bacteremia and pneumonia.  Hand abscess & bacteremia - continue with vancomycin, await repeat blood cultures to see if persistent or if she is clearing infection - recommend to get TTE - minimum of 2 wks, possibly 4 wk of treatment.  - pneumonia = currently day 4 of 7 of piptazo. Sputum cx pending, though collected after 3 days of abtx. It is probably related to MRSA.  ur legionella and ur strep pneumo antigen pending. Continue to monitor symptoms to see if effusions  worsens.  - chronic hep c without hepatic coma = will check viral load and hiv test  ? Melena = would test for heme, consider starting ppi.  dispo maybe challenging since would not want her to be discharged home with IV picc line  Dr Lucianne Lei dam to provide recs over the next few days  [place], [facility] for Infectious Diseases Cell: [phone] Pager: [phone]  04/16/2016, 2:49 PM

## 2016-04-16 NOTE — Progress Notes (Signed)
PROGRESS NOTE    KYMBERLEY VERBEEK  [id number] DOB: 14-Sep-1960 DOA: 04/13/2016 PCP: [facility]   Brief Narrative: [name] is a 55 y.o. female with medical history significant of IVDU, chronic pain, bipolar disorder, ADHD, anxiety, polysubstance abuse, hepatitis C, thrombocytopenia, HTN, PUD who presented with right hand swelling, found to have an abscess. She is s/p incision and drainage by hand surgery on 04/14/16 but found to have likely MRSA bacteremia (1/2 bottles positive for gram positive staph so far). Hand surgery and infectious disease consulting. Reported dark watery stools 4 on 11/22, checking FOBT and trending hemoglobin. If stool positive for occult blood and or hemoglobin dropping, consider GI consultation.   Assessment & Plan:   Principal Problem:   Cellulitis of right hand Active Problems:   Community acquired pneumonia of right lower lobe of lung ([place])   Essential hypertension   MRSA bacteremia   Abscess of hand, right   Bipolar affective disorder ([place])   MRSA Right hand abscess with secondary bacteremia S/p I&D in the OR by Dr. Fredna Dow on 04/14/16. ID consultation and follow-up appreciated. Recommend continuing with vancomycin and follow up repeat blood cultures to see if infection is persistent or clearing up. Also recommend TTE (ordered 12/22) and minimum 2 weeks or possibly 4 weeks of treatment. - 1 of 2 blood cultures 04/14/16 shows Staphylococcus aureus >speciation and sensitivity pending. Blood cultures 211/21/17: Negative to date. Right hand abscess culture 04/14/16 confirms MRSA  Community acquired pneumonia/pleurisy Patient had tachycardia, which has improved, pleuritic/musculoskeletal type chest pain and is on room air. PE on differential however, Wells Score: 1.5 (for tachycardia which has now improved). -continue vancomycin and zosyn - As per ID follow-up, currently on day 4/7 of Zosyn. Sputum culture pending, though collected after 3 days  of antibiotics. Urine streptococcal antigen negative. Urine Legionella antigen pending. HIV antibodies negative. - Due to concern for malena, discontinued NSAIDs on 11/22. Continue lidocaine patch and Tylenol. Repeat chest x-ray 11/22: Increasing right lower lobe airspace opacity with right effusion compatible with pneumonia.  Bacteremia, appears secondary to MRSA 1/2 bottles significant for Staphylococcus aureus and ID panel significant for MRSA. This is possibly secondary to CAP vs abscess vs IV drug abuse -continue vancomycin -infectious disease to follow-up with recommendations -echocardiogram requested 11/22.  IV drug use Patient has a remote history of amphetamine use via IV. She admits to using methamphetamines via IV in addition to Seroquel via IV a few days ago. UDS significant for amphetamines and cocaine.  Bipolar disorder -continue Seroquel  Hypertension -continue lisinopril. Controlled.  Diarrhea/?? Melena - As per RN, patient had 4 episodes of loose/watery dark stools since 11/22 morning. Checking stool for occult blood to rule out GI bleed. Discontinue NSAIDs and Lovenox DVT prophylaxis. Start IV PPI. Trend CBCs closely. If FOBT positive and or drop in hemoglobin, consider GI consultation.  Anemia - Stable. Trend CBC due to concern for GI bleed.  Thrombocytopenia  - stable.? Related to hepatitis C cirrhosis.  Chronic hepatitis C with cirrhosis without hepatic coma - Ultrasound 2013 confirm cirrhosis. As per ID, checking viral load. HIV antibody testing negative.   DVT prophylaxis: Lovenox-discontinued and started SCD on 11/22. Code Status: Full code Family Communication: None at bedside Disposition Plan: Anticipate eventual discharge home after bacteremia workup   Consultants:   Hand surgery  Infectious disease  Procedures:   I&D of right hand abscess (11/20)  Antimicrobials:   Vancomycin (11/20>>  Zosyn (11/20>>    Subjective: Continues to  complain of right lower anterior chest/upper abdominal pain, worse with chest wall movements, deep breath or coughing. Cough with intermittent productive sputum. Denies dyspnea. Subsequently RN reported that patient had 4 episodes of loose dark stools.   Objective: Vitals:   04/14/16 2300 04/15/16 0531 04/15/16 2031 04/16/16 0500  BP: 117/63 (!) 107/48 139/70 123/66  Pulse: (!) 111 99 99 97  Resp: 20 19 18 16   Temp: 99.3 F (37.4 C) 98.7 F (37.1 C) 98.7 F (37.1 C) 99 F (37.2 C)  TempSrc: Oral Oral Oral Oral  SpO2: 90% 91% 92% 93%  Weight:      Height:        Intake/Output Summary (Last 24 hours) at 04/16/16 1456 Last data filed at 04/16/16 [id number]  Gross per 24 hour  Intake             1130 ml  Output                0 ml  Net             1130 ml   Filed Weights   04/13/16 2354 04/14/16 0547  Weight: 72.6 kg (160 lb) 72.5 kg (159 lb 13.3 oz)    Examination:  General exam: Pleasant middle-aged female lying comfortably propped up in bed with intermittent mild painful distress on moving around in bed or deep inspiration.  Respiratory system: diminished breath sounds at right lower lung with? Bronchial breath sounds and occasional crackles . Clear everywhere else. Respiratory effort normal. No pleural rub appreciated.  Cardiovascular system: S1 & S2 heard, RRR. No murmurs, rubs, gallops or clicks. Gastrointestinal system: Abdomen is nondistended, soft and nontender. Normal bowel sounds heard. Central nervous system: Alert and oriented. No focal neurological deficits. Extremities: No edema. No calf tenderness Skin: No cyanosis. No rashes Psychiatry: Judgement and insight appear normal. Mood & affect appropriate.     Data Reviewed: I have personally reviewed following labs and imaging studies  CBC:  Recent Labs Lab 04/14/16 0152 04/14/16 0305 04/14/16 1047 04/15/16 0444 04/16/16 0644  WBC 11.5*  --  7.9 5.9 6.4  NEUTROABS 9.1*  --   --   --   --   HGB 13.0 12.2 11.2*  10.9* 10.9*  HCT 38.4 36.0 33.2* 33.2* 32.8*  MCV 91.2  --  90.5 92.2 91.9  PLT 114*  --  93* 102* [id number]*   Basic Metabolic Panel:  Recent Labs Lab 04/14/16 0305 04/14/16 1047 04/16/16 0644  NA 134*  --  133*  K 3.1*  --  4.2  CL 97*  --  101  CO2  --   --  25  GLUCOSE 141*  --  116*  BUN 9  --  16  CREATININE 0.70 0.96 0.81  CALCIUM  --   --  8.6*   GFR: Estimated Creatinine Clearance: 76.3 mL/min (by C-G formula based on SCr of 0.81 mg/dL). Liver Function Tests: No results for input(s): AST, ALT, ALKPHOS, BILITOT, PROT, ALBUMIN in the last 168 hours. No results for input(s): LIPASE, AMYLASE in the last 168 hours. No results for input(s): AMMONIA in the last 168 hours. Coagulation Profile: No results for input(s): INR, PROTIME in the last 168 hours. Cardiac Enzymes: No results for input(s): CKTOTAL, CKMB, CKMBINDEX, TROPONINI in the last 168 hours. BNP (last 3 results) No results for input(s): PROBNP in the last 8760 hours. HbA1C: No results for input(s): HGBA1C in the last 72 hours. CBG: No results for input(s): GLUCAP in the last  168 hours. Lipid Profile: No results for input(s): CHOL, HDL, LDLCALC, TRIG, CHOLHDL, LDLDIRECT in the last 72 hours. Thyroid Function Tests: No results for input(s): TSH, T4TOTAL, FREET4, T3FREE, THYROIDAB in the last 72 hours. Anemia Panel: No results for input(s): VITAMINB12, FOLATE, FERRITIN, TIBC, IRON, RETICCTPCT in the last 72 hours. Sepsis Labs: No results for input(s): PROCALCITON, LATICACIDVEN in the last 168 hours.  Recent Results (from the past 240 hour(s))  Blood culture (routine x 2)     Status: None (Preliminary result)   Collection Time: 04/14/16  1:52 AM  Result Value Ref Range Status   Specimen Description BLOOD BLOOD LEFT HAND  Final   Special Requests BOTTLES DRAWN AEROBIC ONLY 4ML  Final   Culture   Final    NO GROWTH 1 DAY Performed at [facility]    Report Status PENDING  Incomplete  Blood culture  (routine x 2)     Status: Abnormal (Preliminary result)   Collection Time: 04/14/16  1:52 AM  Result Value Ref Range Status   Specimen Description BLOOD BLOOD LEFT FOREARM  Final   Special Requests IN PEDIATRIC BOTTLE 0.5 ML  Final   Culture  Setup Time   Final    GRAM POSITIVE COCCI IN CLUSTERS IN PEDIATRIC BOTTLE V BRYK,PHARMD @0534  04/15/16 MKELLY,MLT Performed at [facility] (A)  Final   Report Status PENDING  Incomplete  Blood Culture ID Panel (Reflexed)     Status: Abnormal   Collection Time: 04/14/16  1:52 AM  Result Value Ref Range Status   Enterococcus species NOT DETECTED NOT DETECTED Final   Listeria monocytogenes NOT DETECTED NOT DETECTED Final   Staphylococcus species DETECTED (A) NOT DETECTED Final    Comment: CRITICAL RESULT CALLED TO, READ BACK BY AND VERIFIED WITH: V BRYK,PHARMD @0534  04/15/16 MKELLY,MLT    Staphylococcus aureus DETECTED (A) NOT DETECTED Final    Comment: CRITICAL RESULT CALLED TO, READ BACK BY AND VERIFIED WITH: V BRYK, PHARMD @0534  04/15/16 MKELLY,MLT    Methicillin resistance DETECTED (A) NOT DETECTED Final    Comment: CRITICAL RESULT CALLED TO, READ BACK BY AND VERIFIED WITH: V BRYK,PHARMD @0534  04/15/16 MKELLY,MLT    Streptococcus species NOT DETECTED NOT DETECTED Final   Streptococcus agalactiae NOT DETECTED NOT DETECTED Final   Streptococcus pneumoniae NOT DETECTED NOT DETECTED Final   Streptococcus pyogenes NOT DETECTED NOT DETECTED Final   Acinetobacter baumannii NOT DETECTED NOT DETECTED Final   Enterobacteriaceae species NOT DETECTED NOT DETECTED Final   Enterobacter cloacae complex NOT DETECTED NOT DETECTED Final   Escherichia coli NOT DETECTED NOT DETECTED Final   Klebsiella oxytoca NOT DETECTED NOT DETECTED Final   Klebsiella pneumoniae NOT DETECTED NOT DETECTED Final   Proteus species NOT DETECTED NOT DETECTED Final   Serratia marcescens NOT DETECTED NOT DETECTED Final   Haemophilus  influenzae NOT DETECTED NOT DETECTED Final   Neisseria meningitidis NOT DETECTED NOT DETECTED Final   Pseudomonas aeruginosa NOT DETECTED NOT DETECTED Final   Candida albicans NOT DETECTED NOT DETECTED Final   Candida glabrata NOT DETECTED NOT DETECTED Final   Candida krusei NOT DETECTED NOT DETECTED Final   Candida parapsilosis NOT DETECTED NOT DETECTED Final   Candida tropicalis NOT DETECTED NOT DETECTED Final    Comment: Performed at [facility]  Aerobic/Anaerobic Culture (surgical/deep wound)     Status: None (Preliminary result)   Collection Time: 04/14/16  9:00 AM  Result Value Ref Range Status   Specimen Description ABSCESS  RIGHT HAND  Final   Special Requests NONE  Final   Gram Stain   Final    FEW WBC PRESENT,BOTH PMN AND MONONUCLEAR FEW GRAM POSITIVE COCCI IN PAIRS IN SINGLES FEW GRAM POSITIVE COCCI IN CLUSTERS    Culture   Final    MODERATE METHICILLIN RESISTANT STAPHYLOCOCCUS AUREUS NO ANAEROBES ISOLATED; CULTURE IN PROGRESS FOR 5 DAYS    Report Status PENDING  Incomplete   Organism ID, Bacteria METHICILLIN RESISTANT STAPHYLOCOCCUS AUREUS  Final      Susceptibility   Methicillin resistant staphylococcus aureus - MIC*    CIPROFLOXACIN >=8 RESISTANT Resistant     ERYTHROMYCIN >=8 RESISTANT Resistant     GENTAMICIN <=0.5 SENSITIVE Sensitive     OXACILLIN >=4 RESISTANT Resistant     TETRACYCLINE <=1 SENSITIVE Sensitive     VANCOMYCIN <=0.5 SENSITIVE Sensitive     TRIMETH/SULFA <=10 SENSITIVE Sensitive     CLINDAMYCIN <=0.25 SENSITIVE Sensitive     RIFAMPIN <=0.5 SENSITIVE Sensitive     Inducible Clindamycin NEGATIVE Sensitive     * MODERATE METHICILLIN RESISTANT STAPHYLOCOCCUS AUREUS  Culture, expectorated sputum-assessment     Status: None   Collection Time: 04/16/16  6:31 AM  Result Value Ref Range Status   Specimen Description EXPECTORATED SPUTUM  Final   Special Requests NONE  Final   Sputum evaluation   Final    THIS SPECIMEN IS ACCEPTABLE. RESPIRATORY  CULTURE REPORT TO FOLLOW.   Report Status 04/16/2016 FINAL  Final  Culture, respiratory (NON-Expectorated)     Status: None (Preliminary result)   Collection Time: 04/16/16  1:39 PM  Result Value Ref Range Status   Specimen Description EXPECTORATED SPUTUM  Final   Special Requests NONE  Final   Gram Stain   Final    ABUNDANT WBC PRESENT, PREDOMINANTLY PMN FEW SQUAMOUS EPITHELIAL CELLS PRESENT FEW GRAM POSITIVE COCCI IN PAIRS IN CLUSTERS RARE GRAM POSITIVE RODS    Culture PENDING  Incomplete   Report Status PENDING  Incomplete         Radiology Studies: Dg Chest 2 View  Result Date: 04/16/2016 CLINICAL DATA:  Right side chest pain.  Pneumonia. EXAM: CHEST  2 VIEW COMPARISON:  04/14/2016 FINDINGS: Increasing opacity at the right lung base concerning for pneumonia. Small right effusion. No confluent opacity on the left. Heart is normal size. Small hiatal hernia. No acute bony abnormality. IMPRESSION: Increasing right lower lobe airspace opacity with right effusion compatible with pneumonia. Small hiatal hernia. Electronically Signed   By: Rolm Baptise M.D.   On: 04/16/2016 12:09        Scheduled Meds: . feeding supplement (ENSURE ENLIVE)  237 mL Oral BID PC  . Influenza vac split quadrivalent PF  0.5 mL Intramuscular Tomorrow-1000  . lidocaine  1 patch Transdermal Q24H  . lisinopril  5 mg Oral Daily  . multivitamin with minerals  1 tablet Oral Daily  . pantoprazole (PROTONIX) IV  40 mg Intravenous Q12H  . piperacillin-tazobactam (ZOSYN)  IV  3.375 g Intravenous Q8H  . polyethylene glycol  17 g Oral BID  . vancomycin  1,000 mg Intravenous Q12H  . vitamin C  1,000 mg Oral Daily   Continuous Infusions: . lactated ringers Stopped (04/14/16 0912)  . lactated ringers 75 mL/hr at 04/16/16 0303     LOS: 2 days    Jefry Lesinski, MD, FACP, FHM. [facility] Pager [phone]  If 7PM-7AM, please contact night-coverage [email or web address] Password [facility] 04/16/2016, 3:16  PM

## 2016-04-17 ENCOUNTER — Inpatient Hospital Stay (HOSPITAL_COMMUNITY): Payer: Medicare Other

## 2016-04-17 DIAGNOSIS — K922 Gastrointestinal hemorrhage, unspecified: Secondary | ICD-10-CM

## 2016-04-17 DIAGNOSIS — K7460 Unspecified cirrhosis of liver: Secondary | ICD-10-CM

## 2016-04-17 DIAGNOSIS — I10 Essential (primary) hypertension: Secondary | ICD-10-CM

## 2016-04-17 DIAGNOSIS — D62 Acute posthemorrhagic anemia: Secondary | ICD-10-CM

## 2016-04-17 DIAGNOSIS — R7881 Bacteremia: Secondary | ICD-10-CM

## 2016-04-17 LAB — CBC
HCT: 28.7 % — ABNORMAL LOW (ref 36.0–46.0)
HEMATOCRIT: 28.9 % — AB (ref 36.0–46.0)
HEMATOCRIT: 28.7 % — AB (ref 36.0–46.0)
HEMOGLOBIN: 9.4 g/dL — AB (ref 12.0–15.0)
HEMOGLOBIN: 9.5 g/dL — AB (ref 12.0–15.0)
Hemoglobin: 9.5 g/dL — ABNORMAL LOW (ref 12.0–15.0)
MCH: 29.8 pg (ref 26.0–34.0)
MCH: 30.5 pg (ref 26.0–34.0)
MCH: 30.7 pg (ref 26.0–34.0)
MCHC: 32.5 g/dL (ref 30.0–36.0)
MCHC: 33.1 g/dL (ref 30.0–36.0)
MCHC: 33.1 g/dL (ref 30.0–36.0)
MCV: 91.7 fL (ref 78.0–100.0)
MCV: 92.3 fL (ref 78.0–100.0)
MCV: 92.9 fL (ref 78.0–100.0)
Platelets: 114 10*3/uL — ABNORMAL LOW (ref 150–400)
Platelets: 121 10*3/uL — ABNORMAL LOW (ref 150–400)
Platelets: 131 10*3/uL — ABNORMAL LOW (ref 150–400)
RBC: 3.15 MIL/uL — AB (ref 3.87–5.11)
RBC: 3.11 MIL/uL — ABNORMAL LOW (ref 3.87–5.11)
RBC: 3.09 MIL/uL — ABNORMAL LOW (ref 3.87–5.11)
RDW: 14.2 % (ref 11.5–15.5)
RDW: 14.1 % (ref 11.5–15.5)
RDW: 14.1 % (ref 11.5–15.5)
WBC: 4.8 10*3/uL (ref 4.0–10.5)
WBC: 4.9 10*3/uL (ref 4.0–10.5)
WBC: 3.9 10*3/uL — ABNORMAL LOW (ref 4.0–10.5)

## 2016-04-17 LAB — LEGIONELLA PNEUMOPHILA SEROGP 1 UR AG: L. PNEUMOPHILA SEROGP 1 UR AG: NEGATIVE

## 2016-04-17 LAB — CULTURE, BLOOD (ROUTINE X 2)

## 2016-04-17 LAB — ECHOCARDIOGRAM COMPLETE
HEIGHTINCHES: 67 in
WEIGHTICAEL: 2557.34 [oz_av]

## 2016-04-17 LAB — VANCOMYCIN, TROUGH: VANCOMYCIN TR: 13 ug/mL — AB (ref 15–20)

## 2016-04-17 MED ORDER — SODIUM CHLORIDE 0.9% FLUSH: 10.0000 mL | INTRAVENOUS | Status: DC | PRN

## 2016-04-17 MED ORDER — OCTREOTIDE LOAD VIA INFUSION
50.0000 ug | Freq: Once | INTRAVENOUS | Status: AC
  Administered 2016-04-17: 50 ug via INTRAVENOUS
  Filled 2016-04-17: qty 25

## 2016-04-17 MED ORDER — VANCOMYCIN HCL 10 G IV SOLR
1250.0000 mg | Freq: Two times a day (BID) | INTRAVENOUS | Status: DC
  Administered 2016-04-17 – 2016-04-18 (×3): 1250 mg via INTRAVENOUS
  Filled 2016-04-17 (×4): qty 1250

## 2016-04-17 MED ORDER — SODIUM CHLORIDE 0.9% FLUSH
10.0000 mL | Freq: Two times a day (BID) | INTRAVENOUS | Status: DC
  Administered 2016-04-17 – 2016-04-18 (×2): 10 mL

## 2016-04-17 MED ORDER — OXYCODONE-ACETAMINOPHEN 7.5-325 MG PO TABS
1.0000 | ORAL_TABLET | ORAL | Status: DC | PRN
  Administered 2016-04-17 – 2016-04-18 (×5): 1 via ORAL
  Filled 2016-04-17 (×5): qty 1

## 2016-04-17 MED ORDER — SODIUM CHLORIDE 0.9 % IV SOLN
50.0000 ug/h | INTRAVENOUS | Status: DC
  Administered 2016-04-17 (×2): 50 ug/h via INTRAVENOUS
  Filled 2016-04-17 (×6): qty 1

## 2016-04-17 MED ORDER — MORPHINE SULFATE (PF) 4 MG/ML IV SOLN
4.0000 mg | INTRAVENOUS | Status: DC | PRN
  Administered 2016-04-17 – 2016-04-18 (×9): 4 mg via INTRAVENOUS
  Filled 2016-04-17 (×11): qty 1

## 2016-04-17 NOTE — Progress Notes (Signed)
Pharmacy Antibiotic Note  [name] is a 55 y.o. female admitted on 04/13/2016 with MRSA bacteremia, abscess.  Pharmacy has been consulted for Vancomycin dosing.  Day #4 of abx for MRSA bacteremia and R hand abscess. Also has some wrist cellulitis and possible PNA. CXR shows possible RLL infiltrate. ID following. Got TTE today but not read yet. VT today was slightly subtherapeutic at 13. Tmax of 100.8 yesterday, WBC wnl  Plan: Increase vancomycin to 1,250mg  IV Q12 Continue Zosyn 3.375 gm IV q8h (4 hour infusion) Monitor clinical picture, renal function, VT at Css F/U C&S, abx deescalation / LOT  Height: 5\' 7"  (170.2 cm) Weight: 159 lb 13.3 oz (72.5 kg) IBW/kg (Calculated) : 61.6  Temp (24hrs), Avg:99.8 F (37.7 C), Min:98.7 F (37.1 C), Max:100.8 F (38.2 C)   Recent Labs Lab 04/14/16 0305 04/14/16 1047  04/15/16 1225 04/16/16 0644 04/16/16 1400 04/16/16 2253 04/17/16 0552 04/17/16 1408  WBC  --  7.9  < >  --  6.4 6.9 5.6 4.8 4.9  CREATININE 0.70 0.96  --   --  0.81  --   --   --   --   VANCOTROUGH  --   --   --  10*  --   --   --   --  13*  < > = values in this interval not displayed.  Estimated Creatinine Clearance: 76.3 mL/min (by C-G formula based on SCr of 0.81 mg/dL).    Allergies  Allergen Reactions  . Albuterol Other (See Comments)    PT states she had seizures from an albuterol treatment and was hospitalized for it  . Aspirin Hives    Vomits blood  . Adhesive [Tape] Rash    Antimicrobials this admission:  Vancomycin 11/20 >>   Zosyn 11/20 >>    Dose adjustments this admission:  11/21 VT = 10 (on 750mg  q12; increased to1g q12) 11/23 VT = 13 (on 1g q12; increased to 1,250mg  q12)  Microbiology results:  11/20 BCx: #1/2 MRSA 11/20 Sputum: sent  11/20 surgical wound cx: MRSA (S-Bactrim, Doxy, Clinda) 11/21: BCx: ngtd  Thank you for allowing pharmacy to be a part of this patient's care.  Elenor Quinones, PharmD, BCPS Clinical Pharmacist Pager  [phone] 04/17/2016 2:58 PM

## 2016-04-17 NOTE — Progress Notes (Signed)
Echocardiogram 2D Echocardiogram has been performed.  Aggie Cosier 04/17/2016, 2:44 PM

## 2016-04-17 NOTE — Progress Notes (Signed)
PROGRESS NOTE    [name]  [id number] DOB: [birth year] DOA: 04/13/2016 PCP: [place]   Brief Narrative: [name] is a 55 y.o. female with medical history significant of IVDU, chronic pain, bipolar disorder, ADHD, anxiety, polysubstance abuse, hepatitis C, thrombocytopenia, HTN, PUD who presented with right hand swelling, found to have an abscess. She is s/p incision and drainage by hand surgery on 04/14/16 but found to have likely MRSA bacteremia (1/2 bottles positive for gram positive staph so far). Hand surgery and infectious disease consulting. Reported dark watery stools 4 on 11/22> suspicious for upper GI bleed. Mild acute blood loss anemia. [facility] (covering for primary GI Dr. Collene Mares) consulted 11/23 and a plan for EGD 11/24.   Assessment & Plan:   Principal Problem:   Cellulitis of right hand Active Problems:   Community acquired pneumonia of right lower lobe of lung ([place])   Essential hypertension   MRSA bacteremia   Abscess of hand, right   Bipolar affective disorder ([place])   Upper GI bleed   Cirrhosis of liver without ascites ([place])   MRSA Right hand abscess with secondary bacteremia S/p I&D in the OR by Dr. Fredna Dow on 04/14/16. ID consultation and follow-up appreciated. Recommend continuing with vancomycin and follow up repeat blood cultures to see if infection is persistent or clearing up. Also recommend TTE (ordered 12/22) and minimum 2 weeks or possibly 4 weeks of treatment. - 1 of 2 blood cultures 04/14/16 shows MRSA. Blood cultures 2 of 04/15/16: Negative to date. Right hand abscess culture 04/14/16 confirms MRSA  Community acquired pneumonia/pleurisy Patient had tachycardia, which has improved, pleuritic/musculoskeletal type chest pain and is on room air. PE on differential however, Wells Score: 1.5 (for tachycardia which has now improved). -continue vancomycin and zosyn - As per ID follow-up, currently on day 5/7 of Zosyn. Sputum culture  shows moderate Staphylococcus aureus (sensitivities pending), though collected after 3 days of antibiotics. Urine streptococcal antigen negative. Urine Legionella antigen pending. HIV antibodies negative. - Due to concern for malena, discontinued NSAIDs on 11/22. Continue lidocaine patch (patient apparently has been refusing but willing to try today) and Tylenol. Repeat chest x-ray 11/22: Increasing right lower lobe airspace opacity with right effusion compatible with pneumonia. - Adjusted pain medications due to ongoing moderate to severe right-sided chest pain.  MRSA bacteremia 1/2 bottles positive for MRSA. This is possibly secondary to CAP vs abscess vs IV drug abuse -continue vancomycin -infectious disease to follow-up with recommendations -echocardiogram requested 11/22 and to be done.  IV drug use/cocaine abuse Patient has a remote history of amphetamine use via IV. She admits to using methamphetamines via IV in addition to Seroquel via IV a few days ago. UDS significant for amphetamines and cocaine. UDS on admission positive for amphetamines and cocaine. Reluctant to send her home with PICC line given history of IVDA.  Bipolar disorder -continue Seroquel  Hypertension -continue lisinopril. Controlled.  Acute upper GI bleed - As per RN, patient had 4 episodes of loose/watery dark stools since 11/22 morning. FOBT +. Discontinued NSAIDs and Lovenox DVT prophylaxis. Start IV PPI. Overnight 11/23 had one or 2 more episodes of black stools. Hemoglobin has dropped approximately 2 g. Differential diagnosis: Esophageal varices, portal gastropathy (has this in the past), esophagitis, ulcers etc. [facility] consulted. Octreotide added. Plan for EGD 11/24.  Acute blood loss anemia - Hemoglobin has dropped approximately 2 g since yesterday. Continue to trend CBCs and transfuse if hemoglobin less than 7 g per DL.  Thrombocytopenia  - stable.? Related to hepatitis C cirrhosis.  Chronic  hepatitis C with cirrhosis without hepatic coma - Ultrasound 2013 confirm cirrhosis. As per ID, checking viral load. HIV antibody testing negative.   DVT prophylaxis: Lovenox-discontinued and started SCD on 11/22. Code Status: Full code Family Communication: None at bedside Disposition Plan: Anticipate eventual discharge home after bacteremia workup   Consultants:   Hand surgery  Infectious disease  GI  Procedures:   I&D of right hand abscess (11/20)  PICC line (placed due to difficult stick (scratch that)  Antimicrobials:   Vancomycin (11/20>>  Zosyn (11/20>>    Subjective: 2 episodes of black stools overnight. Continues to complain of significant right-sided lower chest pain, worse with deep inspiration, coughing or movement. No dyspnea reported. States that she tolerates Percocet but does not tolerate Vicodin. As per RN, no other acute issues.  Objective: Vitals:   04/16/16 0500 04/16/16 1500 04/16/16 2014 04/17/16 0500  BP: 123/66 (!) 169/69 (!) 141/57 (!) 145/68  Pulse: 97 95 (!) 109 92  Resp: 16 18 18 18   Temp: 99 F (37.2 C) 99.9 F (37.7 C) (!) 100.8 F (38.2 C) 98.7 F (37.1 C)  TempSrc: Oral Oral Oral Oral  SpO2: 93% 98% 96% 96%  Weight:      Height:        Intake/Output Summary (Last 24 hours) at 04/17/16 1442 Last data filed at 04/17/16 0400  Gross per 24 hour  Intake             1740 ml  Output                0 ml  Net             1740 ml   Filed Weights   04/13/16 2354 04/14/16 0547  Weight: 72.6 kg (160 lb) 72.5 kg (159 lb 13.3 oz)    Examination:  General exam: Pleasant middle-aged female lying comfortably propped up in bed and in no obvious distress.  Respiratory system: diminished breath sounds at right lower lung with? Bronchial breath sounds and occasional crackles . Clear everywhere else. Respiratory effort normal. No pleural rub appreciated.  Cardiovascular system: S1 & S2 heard, RRR. No murmurs, rubs, gallops or  clicks. Gastrointestinal system: Abdomen is nondistended, soft and nontender. Normal bowel sounds heard. Central nervous system: Alert and oriented. No focal neurological deficits. Extremities: No edema. No calf tenderness Skin: No cyanosis. No rashes Psychiatry: Judgement and insight appear normal. Mood & affect appropriate.     Data Reviewed: I have personally reviewed following labs and imaging studies  CBC:  Recent Labs Lab 04/14/16 0152  04/16/16 0644 04/16/16 1400 04/16/16 2253 04/17/16 0552 04/17/16 1408  WBC 11.5*  < > 6.4 6.9 5.6 4.8 4.9  NEUTROABS 9.1*  --   --   --   --   --   --   HGB 13.0  < > 10.9* 11.2* 10.0* 9.4* 9.5*  HCT 38.4  < > 32.8* 33.6* 30.5* 28.9* 28.7*  MCV 91.2  < > 91.9 91.3 91.9 91.7 92.3  PLT 114*  < > 126* 122* 127* 114* 121*  < > = values in this interval not displayed. Basic Metabolic Panel:  Recent Labs Lab 04/14/16 0305 04/14/16 1047 04/16/16 0644  NA 134*  --  133*  K 3.1*  --  4.2  CL 97*  --  101  CO2  --   --  25  GLUCOSE 141*  --  116*  BUN 9  --  16  CREATININE 0.70 0.96 0.81  CALCIUM  --   --  8.6*   GFR: Estimated Creatinine Clearance: 76.3 mL/min (by C-G formula based on SCr of 0.81 mg/dL). Liver Function Tests: No results for input(s): AST, ALT, ALKPHOS, BILITOT, PROT, ALBUMIN in the last 168 hours. No results for input(s): LIPASE, AMYLASE in the last 168 hours. No results for input(s): AMMONIA in the last 168 hours. Coagulation Profile:  Recent Labs Lab 04/16/16 1533  INR 1.20   Cardiac Enzymes: No results for input(s): CKTOTAL, CKMB, CKMBINDEX, TROPONINI in the last 168 hours. BNP (last 3 results) No results for input(s): PROBNP in the last 8760 hours. HbA1C: No results for input(s): HGBA1C in the last 72 hours. CBG: No results for input(s): GLUCAP in the last 168 hours. Lipid Profile: No results for input(s): CHOL, HDL, LDLCALC, TRIG, CHOLHDL, LDLDIRECT in the last 72 hours. Thyroid Function Tests: No  results for input(s): TSH, T4TOTAL, FREET4, T3FREE, THYROIDAB in the last 72 hours. Anemia Panel: No results for input(s): VITAMINB12, FOLATE, FERRITIN, TIBC, IRON, RETICCTPCT in the last 72 hours. Sepsis Labs: No results for input(s): PROCALCITON, LATICACIDVEN in the last 168 hours.  Recent Results (from the past 240 hour(s))  Blood culture (routine x 2)     Status: None (Preliminary result)   Collection Time: 04/14/16  1:52 AM  Result Value Ref Range Status   Specimen Description BLOOD BLOOD LEFT HAND  Final   Special Requests BOTTLES DRAWN AEROBIC ONLY 4ML  Final   Culture   Final    NO GROWTH 3 DAYS Performed at [facility]    Report Status PENDING  Incomplete  Blood culture (routine x 2)     Status: Abnormal   Collection Time: 04/14/16  1:52 AM  Result Value Ref Range Status   Specimen Description BLOOD BLOOD LEFT FOREARM  Final   Special Requests IN PEDIATRIC BOTTLE 0.5 ML  Final   Culture  Setup Time   Final    GRAM POSITIVE COCCI IN CLUSTERS IN PEDIATRIC BOTTLE V BRYK,PHARMD @0534  04/15/16 MKELLY,MLT Performed at [facility]    Culture METHICILLIN RESISTANT STAPHYLOCOCCUS AUREUS (A)  Final   Report Status 04/17/2016 FINAL  Final   Organism ID, Bacteria METHICILLIN RESISTANT STAPHYLOCOCCUS AUREUS  Final      Susceptibility   Methicillin resistant staphylococcus aureus - MIC*    CIPROFLOXACIN >=8 RESISTANT Resistant     ERYTHROMYCIN >=8 RESISTANT Resistant     GENTAMICIN <=0.5 SENSITIVE Sensitive     OXACILLIN >=4 RESISTANT Resistant     TETRACYCLINE <=1 SENSITIVE Sensitive     VANCOMYCIN <=0.5 SENSITIVE Sensitive     TRIMETH/SULFA <=10 SENSITIVE Sensitive     CLINDAMYCIN <=0.25 SENSITIVE Sensitive     RIFAMPIN <=0.5 SENSITIVE Sensitive     Inducible Clindamycin NEGATIVE Sensitive     * METHICILLIN RESISTANT STAPHYLOCOCCUS AUREUS  Blood Culture ID Panel (Reflexed)     Status: Abnormal   Collection Time: 04/14/16  1:52 AM  Result Value Ref Range  Status   Enterococcus species NOT DETECTED NOT DETECTED Final   Listeria monocytogenes NOT DETECTED NOT DETECTED Final   Staphylococcus species DETECTED (A) NOT DETECTED Final    Comment: CRITICAL RESULT CALLED TO, READ BACK BY AND VERIFIED WITH: V BRYK,PHARMD @0534  04/15/16 MKELLY,MLT    Staphylococcus aureus DETECTED (A) NOT DETECTED Final    Comment: CRITICAL RESULT CALLED TO, READ BACK BY AND VERIFIED WITH: V BRYK, PHARMD @0534  04/15/16 MKELLY,MLT  Methicillin resistance DETECTED (A) NOT DETECTED Final    Comment: CRITICAL RESULT CALLED TO, READ BACK BY AND VERIFIED WITH: V BRYK,PHARMD @0534  04/15/16 MKELLY,MLT    Streptococcus species NOT DETECTED NOT DETECTED Final   Streptococcus agalactiae NOT DETECTED NOT DETECTED Final   Streptococcus pneumoniae NOT DETECTED NOT DETECTED Final   Streptococcus pyogenes NOT DETECTED NOT DETECTED Final   Acinetobacter baumannii NOT DETECTED NOT DETECTED Final   Enterobacteriaceae species NOT DETECTED NOT DETECTED Final   Enterobacter cloacae complex NOT DETECTED NOT DETECTED Final   Escherichia coli NOT DETECTED NOT DETECTED Final   Klebsiella oxytoca NOT DETECTED NOT DETECTED Final   Klebsiella pneumoniae NOT DETECTED NOT DETECTED Final   Proteus species NOT DETECTED NOT DETECTED Final   Serratia marcescens NOT DETECTED NOT DETECTED Final   Haemophilus influenzae NOT DETECTED NOT DETECTED Final   Neisseria meningitidis NOT DETECTED NOT DETECTED Final   Pseudomonas aeruginosa NOT DETECTED NOT DETECTED Final   Candida albicans NOT DETECTED NOT DETECTED Final   Candida glabrata NOT DETECTED NOT DETECTED Final   Candida krusei NOT DETECTED NOT DETECTED Final   Candida parapsilosis NOT DETECTED NOT DETECTED Final   Candida tropicalis NOT DETECTED NOT DETECTED Final    Comment: Performed at [facility]  Aerobic/Anaerobic Culture (surgical/deep wound)     Status: None (Preliminary result)   Collection Time: 04/14/16  9:00 AM  Result  Value Ref Range Status   Specimen Description ABSCESS RIGHT HAND  Final   Special Requests NONE  Final   Gram Stain   Final    FEW WBC PRESENT,BOTH PMN AND MONONUCLEAR FEW GRAM POSITIVE COCCI IN PAIRS IN SINGLES FEW GRAM POSITIVE COCCI IN CLUSTERS    Culture   Final    MODERATE METHICILLIN RESISTANT STAPHYLOCOCCUS AUREUS NO ANAEROBES ISOLATED; CULTURE IN PROGRESS FOR 5 DAYS    Report Status PENDING  Incomplete   Organism ID, Bacteria METHICILLIN RESISTANT STAPHYLOCOCCUS AUREUS  Final      Susceptibility   Methicillin resistant staphylococcus aureus - MIC*    CIPROFLOXACIN >=8 RESISTANT Resistant     ERYTHROMYCIN >=8 RESISTANT Resistant     GENTAMICIN <=0.5 SENSITIVE Sensitive     OXACILLIN >=4 RESISTANT Resistant     TETRACYCLINE <=1 SENSITIVE Sensitive     VANCOMYCIN <=0.5 SENSITIVE Sensitive     TRIMETH/SULFA <=10 SENSITIVE Sensitive     CLINDAMYCIN <=0.25 SENSITIVE Sensitive     RIFAMPIN <=0.5 SENSITIVE Sensitive     Inducible Clindamycin NEGATIVE Sensitive     * MODERATE METHICILLIN RESISTANT STAPHYLOCOCCUS AUREUS  Culture, blood (routine x 2)     Status: None (Preliminary result)   Collection Time: 04/15/16 12:30 PM  Result Value Ref Range Status   Specimen Description BLOOD RIGHT ANTECUBITAL  Final   Special Requests BOTTLES DRAWN AEROBIC ONLY 5CC  Final   Culture NO GROWTH 2 DAYS  Final   Report Status PENDING  Incomplete  Culture, blood (routine x 2)     Status: None (Preliminary result)   Collection Time: 04/15/16 12:35 PM  Result Value Ref Range Status   Specimen Description BLOOD LEFT HAND  Final   Special Requests BOTTLES DRAWN AEROBIC ONLY 5CC  Final   Culture NO GROWTH 2 DAYS  Final   Report Status PENDING  Incomplete  Culture, expectorated sputum-assessment     Status: None   Collection Time: 04/16/16  6:31 AM  Result Value Ref Range Status   Specimen Description EXPECTORATED SPUTUM  Final   Special Requests NONE  Final   Sputum evaluation   Final    THIS  SPECIMEN IS ACCEPTABLE. RESPIRATORY CULTURE REPORT TO FOLLOW.   Report Status 04/16/2016 FINAL  Final  Culture, respiratory (NON-Expectorated)     Status: None (Preliminary result)   Collection Time: 04/16/16  1:39 PM  Result Value Ref Range Status   Specimen Description EXPECTORATED SPUTUM  Final   Special Requests NONE  Final   Gram Stain   Final    ABUNDANT WBC PRESENT, PREDOMINANTLY PMN FEW SQUAMOUS EPITHELIAL CELLS PRESENT FEW GRAM POSITIVE COCCI IN PAIRS IN CLUSTERS RARE GRAM POSITIVE RODS    Culture   Final    MODERATE STAPHYLOCOCCUS AUREUS SUSCEPTIBILITIES TO FOLLOW    Report Status PENDING  Incomplete         Radiology Studies: Dg Chest 2 View  Result Date: 04/16/2016 CLINICAL DATA:  Right side chest pain.  Pneumonia. EXAM: CHEST  2 VIEW COMPARISON:  04/14/2016 FINDINGS: Increasing opacity at the right lung base concerning for pneumonia. Small right effusion. No confluent opacity on the left. Heart is normal size. Small hiatal hernia. No acute bony abnormality. IMPRESSION: Increasing right lower lobe airspace opacity with right effusion compatible with pneumonia. Small hiatal hernia. Electronically Signed   By: Rolm Baptise M.D.   On: 04/16/2016 12:09        Scheduled Meds: . feeding supplement (ENSURE ENLIVE)  237 mL Oral BID PC  . Influenza vac split quadrivalent PF  0.5 mL Intramuscular Tomorrow-1000  . lidocaine  1 patch Transdermal Q24H  . lisinopril  5 mg Oral Daily  . multivitamin with minerals  1 tablet Oral Daily  . pantoprazole (PROTONIX) IV  40 mg Intravenous Q12H  . piperacillin-tazobactam (ZOSYN)  IV  3.375 g Intravenous Q8H  . sodium chloride flush  10-40 mL Intracatheter Q12H  . vancomycin  1,000 mg Intravenous Q12H  . vitamin C  1,000 mg Oral Daily   Continuous Infusions: . lactated ringers 50 mL/hr at 04/17/16 0400  . octreotide  (SANDOSTATIN)    IV infusion 50 mcg/hr (04/17/16 1216)     LOS: 3 days    Daila Elbert, MD, FACP,  FHM. [facility] Pager [phone]  If 7PM-7AM, please contact night-coverage [email or web address] Password [email or web address] 04/17/2016, 2:42 PM

## 2016-04-17 NOTE — Consult Note (Signed)
Referring Provider: [facility]  Primary Care Physician:  [facility] Gastroenterologist:  Juanita Craver, MD  Reason for Consultation:  GI bleed  HPI: [name] is a 55 y.o. female with hx of bipolar disorder, ADHD, and IV drug use admitted 3 days ago with cellulitis of right hand and ? RLL PNA. She underwent I&D of right hand abscess on daily of admission. Found to be bacteremic, ID following.   Patient complains of right sided abdominal pain which started after vigorous coughing a few days piror to admission. She coughs from sinus drainage. The pain involves whole right abdomen radiating around to right flank. Pain worse with movement. She doesn't relate pain to eating. No nausea. Began having black watery stools a couple of days ago. No bismuth use. Denies NSAIDs.    Past Medical History:  Diagnosis Date  . Anxiety   . Bipolar affective disorder ([place])    ADHD AND SCHZIOPHRENIC--PT GOES TO [place]. BLANKMAN  . Blood transfusion without reported diagnosis   . Cervical radiculopathy   . Chronic pain syndrome   . Complication of anesthesia    STATES SHE WOKE UP TWICE DURING SURGERIES  . DJD (degenerative joint disease)   . GERD (gastroesophageal reflux disease)   . H/O hiatal hernia   . Hepatitis C infection    STATES COMPLETED TREATMENT AND NO LONGER HAS INFECTION  . Hypertension   . Leukocytopenia   . Osteoarthritis   . Osteoporosis   . Peptic ulcer disease   . Pneumonia 2013  . Polysubstance (excluding opioids) dependence ([place])   . Seizures ([facility])    SEIZURE AFTER BREATHING TX (ALBUTEROL) IN ER; ALSO PAST HX SEIZURES DURING DRUG DETOX  . Shortness of breath    WITH EXERTION; SMOKER  . Thrombocytopenia ([place])     Past Surgical History:  Procedure Laterality Date  . ABDOMINAL HYSTERECTOMY    . APPENDECTOMY    . CHOLECYSTECTOMY    . Hyperectomy     due to obstructive ovarian cytst  . I&D EXTREMITY Right 04/14/2016   Procedure: IRRIGATION AND DEBRIDEMENT EXTREMITY;  Surgeon: Leanora Cover, MD;  Location: [facility];  Service: Orthopedics;  Laterality: Right;  . JOINT REPLACEMENT  2012   RIGHT TOTAL HIP REPLACEMENT  . Right knee arthroscopic surgery    . SPINE SURGERY     CERVIAL 6-7 SURGERY - FUSION  . TONSILECTOMY, ADENOIDECTOMY, BILATERAL MYRINGOTOMY AND TUBES    . TOTAL HIP ARTHROPLASTY Left 08/24/2012   Procedure: LEFT TOTAL HIP ARTHROPLASTY ANTERIOR APPROACH;  Surgeon: [name], MD;  Location: [facility];  Service: Orthopedics;  Laterality: Left;  . Total hip replacement      Prior to Admission medications   Medication Sig Start Date End Date Taking? Authorizing Provider  ALPRAZolam Duanne Moron) 1 MG tablet Take 1 mg by mouth 2 (two) times daily as needed for anxiety.  03/19/16  Yes Historical Provider, MD  amphetamine-dextroamphetamine (ADDERALL) 10 MG tablet Take 10 mg by mouth 2 (two) times daily. 03/28/16  Yes Historical Provider, MD  amphetamine-dextroamphetamine (ADDERALL) 20 MG tablet Take 20 mg by mouth 3 (three) times daily. 05/14/16  Yes Historical Provider, MD  lisinopril (PRINIVIL,ZESTRIL) 5 MG tablet Take 1 tablet (5 mg total) by mouth daily. 08/14/14  Yes Freeman Caldron Baker, PA-C  naproxen sodium (ANAPROX) 220 MG tablet Take 440 mg by mouth every 12 (twelve) hours as needed (pain).    Yes Historical Provider, MD  QUEtiapine (SEROQUEL) 200 MG  tablet Take 200 mg by mouth at bedtime as needed (mood, agitation).  03/19/16  Yes Historical Provider, MD    Current Facility-Administered Medications  Medication Dose Route Frequency Provider Last Rate Last Dose  . alum & mag hydroxide-simeth (MAALOX/MYLANTA) 200-200-20 MG/5ML suspension 30 mL  30 mL Oral Q6H PRN Mariel Aloe, MD   30 mL at 04/16/16 0224  . diphenhydrAMINE (BENADRYL) capsule 25-50 mg  25-50 mg Oral Q6H PRN Leanora Cover, MD      . feeding supplement (ENSURE ENLIVE) (ENSURE ENLIVE) liquid 237 mL  237 mL Oral BID PC Mariel Aloe, MD   237 mL at  04/16/16 0912  . guaiFENesin-dextromethorphan (ROBITUSSIN DM) 100-10 MG/5ML syrup 5 mL  5 mL Oral Q4H PRN Etta Quill, DO   5 mL at 04/14/16 2121  . Influenza vac split quadrivalent PF (FLUARIX) injection 0.5 mL  0.5 mL Intramuscular Tomorrow-1000 Mariel Aloe, MD      . lactated ringers infusion   Intravenous Continuous Modena Jansky, MD 50 mL/hr at 04/17/16 0400    . lidocaine (LIDODERM) 5 % 1 patch  1 patch Transdermal Q24H Modena Jansky, MD   1 patch at 04/15/16 0138  . lisinopril (PRINIVIL,ZESTRIL) tablet 5 mg  5 mg Oral Daily Etta Quill, DO   5 mg at 04/16/16 0912  . methocarbamol (ROBAXIN) tablet 500 mg  500 mg Oral Q6H PRN Leanora Cover, MD   500 mg at 04/14/16 1729   Or  . methocarbamol (ROBAXIN) 500 mg in dextrose 5 % 50 mL IVPB  500 mg Intravenous Q6H PRN Leanora Cover, MD      . morphine 4 MG/ML injection 4 mg  4 mg Intravenous Q2H PRN Modena Jansky, MD      . multivitamin with minerals tablet 1 tablet  1 tablet Oral Daily Mariel Aloe, MD   1 tablet at 04/16/16 0912  . ondansetron (ZOFRAN) tablet 4 mg  4 mg Oral Q6H PRN Leanora Cover, MD       Or  . ondansetron [facility]) injection 4 mg  4 mg Intravenous Q6H PRN Leanora Cover, MD   4 mg at 04/16/16 0906  . oxyCODONE-acetaminophen (PERCOCET) 7.5-325 MG per tablet 1 tablet  1 tablet Oral Q4H PRN Modena Jansky, MD      . pantoprazole (PROTONIX) injection 40 mg  40 mg Intravenous Q12H Modena Jansky, MD   40 mg at 04/16/16 2256  . piperacillin-tazobactam (ZOSYN) IVPB 3.375 g  3.375 g Intravenous Q8H Leann T Poindexter, RPH   3.375 g at 04/17/16 0528  . QUEtiapine (SEROQUEL) tablet 200 mg  200 mg Oral QHS PRN Etta Quill, DO      . vancomycin (VANCOCIN) IVPB 1000 mg/200 mL premix  1,000 mg Intravenous Q12H Jaquita Folds, RPH   1,000 mg at 04/17/16 0202  . vitamin C (ASCORBIC ACID) tablet 1,000 mg  1,000 mg Oral Daily Leanora Cover, MD   1,000 mg at 04/16/16 0912    Allergies as of 04/13/2016 - Review Complete  04/13/2016  Allergen Reaction Noted  . Albuterol Other (See Comments) 01/11/2012  . Aspirin Hives 05/14/2011  . Adhesive [tape] Rash 08/11/2012    Family History  Problem Relation Age of Onset  . Breast cancer Sister   . Cancer Sister   . Heart disease Mother   . Heart disease Father     Social History   Social History  . Marital status: Divorced  Spouse name: N/A  . Number of children: N/A  . Years of education: N/A   Occupational History  . Not on file.   Social History Main Topics  . Smoking status: Current Some Day Smoker    Packs/day: 0.50    Years: 20.00    Types: Cigarettes  . Smokeless tobacco: Never Used  . Alcohol use No     Comment: HX OF ABUSE OF PAIN MEDS--PT IS CURRENTLY IN TREATMENT AT [facility]  . Drug use: No  . Sexual activity: Not Currently    Review of Systems: All systems reviewed and negative except where noted in HPI.  Physical Exam: Vital signs in last 24 hours: Temp:  [98.7 F (37.1 C)-100.8 F (38.2 C)] 98.7 F (37.1 C) (11/23 0500) Pulse Rate:  [92-109] 92 (11/23 0500) Resp:  [18] 18 (11/23 0500) BP: (141-169)/(57-69) 145/68 (11/23 0500) SpO2:  [96 %-98 %] 96 % (11/23 0500) Last BM Date: 04/13/16 General:   Thin, white female in NAD Head:  Normocephalic and atraumatic. Eyes:  Sclera clear, no icterus.   Conjunctiva pink. Ears:  Normal auditory acuity. Nose:  No deformity, discharge,  or lesions. Mouth:  No deformity or lesions.   Neck:  Supple; no masses or thyromegaly. Lungs:  Clear throughout to auscultation.   No wheezes, crackles, or rhonchi.  Heart:  Regular rate and rhythm; no murmurs, no lower extremity edema. Abdomen:  Soft,nontender, BS active,nonpalp mass or hsm.   Rectal:  Traces of black stool in vault  Msk:  Symmetrical without gross deformities. . Pulses:  Normal pulses noted. Extremities:  LUE swelling at IV site. Neurologic:  Alert and  oriented x4;  grossly normal  neurologically. Skin:  Intact without significant lesions or rashes.. Psych:  Alert and cooperative. Normal mood and affect.  Intake/Output from previous day: 11/22 0701 - 11/23 0700 In: 1740 [P.O.:840; I.V.:650; IV Piggyback:250] Out: -  Intake/Output this shift: No intake/output data recorded.  Lab Results:  Recent Labs  04/16/16 1400 04/16/16 2253 04/17/16 0552  WBC 6.9 5.6 4.8  HGB 11.2* 10.0* 9.4*  HCT 33.6* 30.5* 28.9*  PLT 122* 127* 114*   BMET  Recent Labs  04/14/16 1047 04/16/16 0644  NA  --  133*  K  --  4.2  CL  --  101  CO2  --  25  GLUCOSE  --  116*  BUN  --  16  CREATININE 0.96 0.81  CALCIUM  --  8.6*   PT/INR  Recent Labs  04/16/16 1533  LABPROT 15.2  INR 1.20    Studies/Results: Dg Chest 2 View  Result Date: 04/16/2016 CLINICAL DATA:  Right side chest pain.  Pneumonia. EXAM: CHEST  2 VIEW COMPARISON:  04/14/2016 FINDINGS: Increasing opacity at the right lung base concerning for pneumonia. Small right effusion. No confluent opacity on the left. Heart is normal size. Small hiatal hernia. No acute bony abnormality. IMPRESSION: Increasing right lower lobe airspace opacity with right effusion compatible with pneumonia. Small hiatal hernia. Electronically Signed   By: Rolm Baptise M.D.   On: 04/16/2016 12:09    IMPRESSION / PLAN:   1. Upper GI bleed with melena. This is a 54 yo female with HCV, cirrhosis, and IV drug use admitted with right hand cellulitis s/p I&D. Now with melena. She has bee on Lovenox for DVT prophylaxis. Bleeding possibly from portal HTN, doubt variceal bleed. No NSAID use but PUD still consideration. She complains of diffuse right sided abdominal pain  but suspect that pain is unrelated and more musculoskeletal in nature -IV has infiltrated. RN contacting IV team.  -Patient stable. Will schedule her for EGD to be done by Dr. Benson Norway tomorrow. If condition starts to deteriorate then will do emergent EGD today -Serial CBCs -Clears  okay, NPO after midnight -continue BID IV PPI -Will add octreotide for precautionary measures though seems unlikely this is a variceal bleed -SBP prophylaxis - already on Zosyn / vancomycin for bacteremia  2. Anemia of acute blood loss. Hgb 13 on admit, down to 9.4 today.  -serial CBC, next one due at 1pm  3. 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 Patient thinks she cleared virus after treatment at [facility] but did not. HCV Quant is 2 million. No obvious ascites on exam. Not encephalopathic. Coags normal. Platelet 114K -AFP -Ultrasound for HCC screening.   4. Illicit drug UDS + cocaine  Drs. Collene Mares and Benson Norway will resume patient's care tomorrow (patient known to Dr. Collene Mares)   Tye Savoy  04/17/2016, 9:31 AM  Pager number [phone]

## 2016-04-17 NOTE — Progress Notes (Signed)
Peripherally Inserted Central Catheter/Midline Placement  The IV Nurse has discussed with the patient and/or persons authorized to consent for the patient, the purpose of this procedure and the potential benefits and risks involved with this procedure.  The benefits include less needle sticks, lab draws from the catheter, and the patient may be discharged home with the catheter. Risks include, but not limited to, infection, bleeding, blood clot (thrombus formation), and puncture of an artery; nerve damage and irregular heartbeat and possibility to perform a PICC exchange if needed/ordered by physician.  Alternatives to this procedure were also discussed.  [facility] [employer] PICC patient education guide, fact sheet on infection prevention and patient information card has been provided to patient /or left at bedside.    PICC/Midline Placement Documentation        Synthia Innocent 04/17/2016, 11:30 AM

## 2016-04-18 ENCOUNTER — Encounter (HOSPITAL_COMMUNITY): Payer: Self-pay | Admitting: *Deleted

## 2016-04-18 ENCOUNTER — Encounter (HOSPITAL_COMMUNITY)
Admission: EM | Disposition: A | Discharge status: Home / Self Care | Payer: Self-pay | Source: Home / Self Care | Attending: Internal Medicine

## 2016-04-18 DIAGNOSIS — B9562 Methicillin resistant Staphylococcus aureus infection as the cause of diseases classified elsewhere: Secondary | ICD-10-CM

## 2016-04-18 DIAGNOSIS — L02419 Cutaneous abscess of limb, unspecified: Secondary | ICD-10-CM

## 2016-04-18 DIAGNOSIS — L02511 Cutaneous abscess of right hand: Secondary | ICD-10-CM

## 2016-04-18 DIAGNOSIS — F250 Schizoaffective disorder, bipolar type: Secondary | ICD-10-CM

## 2016-04-18 DIAGNOSIS — D696 Thrombocytopenia, unspecified: Secondary | ICD-10-CM

## 2016-04-18 DIAGNOSIS — Z95828 Presence of other vascular implants and grafts: Secondary | ICD-10-CM

## 2016-04-18 DIAGNOSIS — F1910 Other psychoactive substance abuse, uncomplicated: Secondary | ICD-10-CM

## 2016-04-18 DIAGNOSIS — J189 Pneumonia, unspecified organism: Secondary | ICD-10-CM

## 2016-04-18 HISTORY — PX: ESOPHAGOGASTRODUODENOSCOPY: SHX5428

## 2016-04-18 LAB — CBC
HCT: 30 % — ABNORMAL LOW (ref 36.0–46.0)
Hemoglobin: 9.8 g/dL — ABNORMAL LOW (ref 12.0–15.0)
MCH: 30.7 pg (ref 26.0–34.0)
MCHC: 32.7 g/dL (ref 30.0–36.0)
MCV: 94 fL (ref 78.0–100.0)
PLATELETS: 127 10*3/uL — AB (ref 150–400)
RBC: 3.19 MIL/uL — ABNORMAL LOW (ref 3.87–5.11)
RDW: 14.4 % (ref 11.5–15.5)
WBC: 4.4 10*3/uL (ref 4.0–10.5)

## 2016-04-18 LAB — COMPREHENSIVE METABOLIC PANEL
ALT: 24 U/L (ref 14–54)
AST: 47 U/L — AB (ref 15–41)
Albumin: 2.1 g/dL — ABNORMAL LOW (ref 3.5–5.0)
Alkaline Phosphatase: 139 U/L — ABNORMAL HIGH (ref 38–126)
Anion gap: 7 (ref 5–15)
BUN: 6 mg/dL (ref 6–20)
CHLORIDE: 101 mmol/L (ref 101–111)
CO2: 29 mmol/L (ref 22–32)
Calcium: 8.4 mg/dL — ABNORMAL LOW (ref 8.9–10.3)
Creatinine, Ser: 0.84 mg/dL (ref 0.44–1.00)
Glucose, Bld: 116 mg/dL — ABNORMAL HIGH (ref 65–99)
POTASSIUM: 4.4 mmol/L (ref 3.5–5.1)
SODIUM: 137 mmol/L (ref 135–145)
Total Bilirubin: 0.7 mg/dL (ref 0.3–1.2)
Total Protein: 6 g/dL — ABNORMAL LOW (ref 6.5–8.1)

## 2016-04-18 LAB — CULTURE, RESPIRATORY W GRAM STAIN

## 2016-04-18 LAB — CULTURE, RESPIRATORY

## 2016-04-18 LAB — AFP TUMOR MARKER: AFP-Tumor Marker: 1.4 ng/mL (ref 0.0–8.3)

## 2016-04-18 SURGERY — EGD (ESOPHAGOGASTRODUODENOSCOPY)
Anesthesia: Moderate Sedation

## 2016-04-18 MED ORDER — MIDAZOLAM HCL 5 MG/ML IJ SOLN
INTRAMUSCULAR | Status: AC
  Filled 2016-04-18: qty 2

## 2016-04-18 MED ORDER — FENTANYL CITRATE (PF) 100 MCG/2ML IJ SOLN
INTRAMUSCULAR | Status: DC | PRN
  Administered 2016-04-18 (×2): 25 ug via INTRAVENOUS

## 2016-04-18 MED ORDER — MIDAZOLAM HCL 10 MG/2ML IJ SOLN
INTRAMUSCULAR | Status: DC | PRN
  Administered 2016-04-18 (×2): 2 mg via INTRAVENOUS

## 2016-04-18 MED ORDER — PANTOPRAZOLE SODIUM 40 MG PO TBEC
40.0000 mg | DELAYED_RELEASE_TABLET | Freq: Two times a day (BID) | ORAL | Status: DC
  Administered 2016-04-18 – 2016-04-19 (×2): 40 mg via ORAL
  Filled 2016-04-18 (×2): qty 1

## 2016-04-18 MED ORDER — DEXTROSE 5 % IV SOLN
1200.0000 mg | Freq: Once | INTRAVENOUS | Status: AC
  Administered 2016-04-18: 1200 mg via INTRAVENOUS
  Filled 2016-04-18: qty 120

## 2016-04-18 MED ORDER — MORPHINE SULFATE (PF) 2 MG/ML IV SOLN
2.0000 mg | INTRAVENOUS | Status: DC | PRN
  Administered 2016-04-18 – 2016-04-19 (×5): 2 mg via INTRAVENOUS
  Filled 2016-04-18 (×5): qty 1

## 2016-04-18 MED ORDER — FENTANYL CITRATE (PF) 100 MCG/2ML IJ SOLN
INTRAMUSCULAR | Status: AC
  Filled 2016-04-18: qty 2

## 2016-04-18 MED ORDER — DIPHENHYDRAMINE HCL 50 MG/ML IJ SOLN
INTRAMUSCULAR | Status: AC
  Filled 2016-04-18: qty 1

## 2016-04-18 MED ORDER — OXYCODONE-ACETAMINOPHEN 7.5-325 MG PO TABS
1.0000 | ORAL_TABLET | Freq: Four times a day (QID) | ORAL | Status: DC | PRN
  Administered 2016-04-18 – 2016-04-19 (×3): 1 via ORAL
  Filled 2016-04-18 (×3): qty 1

## 2016-04-18 MED ORDER — DIPHENHYDRAMINE HCL 50 MG/ML IJ SOLN
INTRAMUSCULAR | Status: DC | PRN
  Administered 2016-04-18: 25 mg via INTRAVENOUS

## 2016-04-18 NOTE — Progress Notes (Addendum)
Subjective:  Patient complaining of hand pain   Antibiotics:  Anti-infectives    Start     Dose/Rate Route Frequency Ordered Stop   04/17/16 1530  vancomycin (VANCOCIN) 1,250 mg in sodium chloride 0.9 % 250 mL IVPB     1,250 mg 166.7 mL/hr over 90 Minutes Intravenous Every 12 hours 04/17/16 1456     04/16/16 0300  vancomycin (VANCOCIN) IVPB 1000 mg/200 mL premix  Status:  Discontinued     1,000 mg 200 mL/hr over 60 Minutes Intravenous Every 12 hours 04/15/16 1359 04/17/16 1456   04/15/16 1400  vancomycin (VANCOCIN) 1,250 mg in sodium chloride 0.9 % 250 mL IVPB     1,250 mg 166.7 mL/hr over 90 Minutes Intravenous  Once 04/15/16 1359 04/15/16 1613   04/14/16 1400  piperacillin-tazobactam (ZOSYN) IVPB 3.375 g     3.375 g 12.5 mL/hr over 240 Minutes Intravenous Every 8 hours 04/14/16 0409     04/14/16 1400  vancomycin (VANCOCIN) IVPB 750 mg/150 ml premix  Status:  Discontinued     750 mg 150 mL/hr over 60 Minutes Intravenous Every 12 hours 04/14/16 0409 04/15/16 1359   04/14/16 0030  vancomycin (VANCOCIN) IVPB 1000 mg/200 mL premix     1,000 mg 200 mL/hr over 60 Minutes Intravenous  Once 04/14/16 0016 04/14/16 0253   04/14/16 0030  piperacillin-tazobactam (ZOSYN) IVPB 3.375 g     3.375 g 100 mL/hr over 30 Minutes Intravenous  Once 04/14/16 0016 04/14/16 0221      Medications: Scheduled Meds: . feeding supplement (ENSURE ENLIVE)  237 mL Oral BID PC  . Influenza vac split quadrivalent PF  0.5 mL Intramuscular Tomorrow-1000  . lidocaine  1 patch Transdermal Q24H  . lisinopril  5 mg Oral Daily  . multivitamin with minerals  1 tablet Oral Daily  . pantoprazole (PROTONIX) IV  40 mg Intravenous Q12H  . piperacillin-tazobactam (ZOSYN)  IV  3.375 g Intravenous Q8H  . sodium chloride flush  10-40 mL Intracatheter Q12H  . vancomycin  1,250 mg Intravenous Q12H  . vitamin C  1,000 mg Oral Daily   Continuous Infusions: . lactated ringers 50 mL/hr at 04/17/16 0400   PRN  Meds:.alum & mag hydroxide-simeth, diphenhydrAMINE, guaiFENesin-dextromethorphan, methocarbamol **OR** methocarbamol (ROBAXIN)  IV, morphine injection, ondansetron **OR** ondansetron (ZOFRAN) IV, oxyCODONE-acetaminophen, QUEtiapine, sodium chloride flush    Objective: Weight change:   Intake/Output Summary (Last 24 hours) at 04/18/16 1553 Last data filed at 04/18/16 1511  Gross per 24 hour  Intake          2834.17 ml  Output                0 ml  Net          2834.17 ml   Blood pressure (!) 151/73, pulse 98, temperature 99.4 F (37.4 C), temperature source Axillary, resp. rate 19, height 5\' 7"  (1.702 m), weight 159 lb 13.3 oz (72.5 kg), SpO2 94 %. Temp:  [97.5 F (36.4 C)-99.4 F (37.4 C)] 99.4 F (37.4 C) (11/24 1331) Pulse Rate:  [82-98] 98 (11/24 1331) Resp:  [13-19] 19 (11/24 1050) BP: (117-251)/(72-197) 151/73 (11/24 1331) SpO2:  [92 %-98 %] 94 % (11/24 1331)  Physical Exam: General: Alert and awake, oriented x3, not in any acute distress. HEENT: anicteric sclera, pupils reactive to light and accommodation, EOMI CVS regular rate, normal r,  no murmur rubs or gallops Chest: clear to auscultation bilaterally, no wheezing, rales or rhonchi Abdomen: soft  nondistended, Extremities:Right arm bandaged  Neuro: nonfocal  CBC:  CBC Latest Ref Rng & Units 04/18/2016 04/17/2016 04/17/2016  WBC 4.0 - 10.5 K/uL 4.4 3.9(L) 4.9  Hemoglobin 12.0 - 15.0 g/dL 9.8(L) 9.5(L) 9.5(L)  Hematocrit 36.0 - 46.0 % 30.0(L) 28.7(L) 28.7(L)  Platelets 150 - 400 K/uL 127(L) 131(L) 121(L)      BMET  Recent Labs  04/16/16 0644 04/18/16 0500  NA 133* 137  K 4.2 4.4  CL 101 101  CO2 25 29  GLUCOSE 116* 116*  BUN 16 6  CREATININE 0.81 0.84  CALCIUM 8.6* 8.4*     Liver Panel   Recent Labs  04/18/16 0500  PROT 6.0*  ALBUMIN 2.1*  AST 47*  ALT 24  ALKPHOS 139*  BILITOT 0.7       Sedimentation Rate No results for input(s): ESRSEDRATE in the last 72 hours. C-Reactive  Protein No results for input(s): CRP in the last 72 hours.  Micro Results: Recent Results (from the past 720 hour(s))  Blood culture (routine x 2)     Status: None (Preliminary result)   Collection Time: 04/14/16  1:52 AM  Result Value Ref Range Status   Specimen Description BLOOD BLOOD LEFT HAND  Final   Special Requests BOTTLES DRAWN AEROBIC ONLY 4ML  Final   Culture   Final    NO GROWTH 4 DAYS Performed at [facility]    Report Status PENDING  Incomplete  Blood culture (routine x 2)     Status: Abnormal   Collection Time: 04/14/16  1:52 AM  Result Value Ref Range Status   Specimen Description BLOOD BLOOD LEFT FOREARM  Final   Special Requests IN PEDIATRIC BOTTLE 0.5 ML  Final   Culture  Setup Time   Final    GRAM POSITIVE COCCI IN CLUSTERS IN PEDIATRIC BOTTLE V BRYK,PHARMD @0534  04/15/16 MKELLY,MLT Performed at [facility]    Culture METHICILLIN RESISTANT STAPHYLOCOCCUS AUREUS (A)  Final   Report Status 04/17/2016 FINAL  Final   Organism ID, Bacteria METHICILLIN RESISTANT STAPHYLOCOCCUS AUREUS  Final      Susceptibility   Methicillin resistant staphylococcus aureus - MIC*    CIPROFLOXACIN >=8 RESISTANT Resistant     ERYTHROMYCIN >=8 RESISTANT Resistant     GENTAMICIN <=0.5 SENSITIVE Sensitive     OXACILLIN >=4 RESISTANT Resistant     TETRACYCLINE <=1 SENSITIVE Sensitive     VANCOMYCIN <=0.5 SENSITIVE Sensitive     TRIMETH/SULFA <=10 SENSITIVE Sensitive     CLINDAMYCIN <=0.25 SENSITIVE Sensitive     RIFAMPIN <=0.5 SENSITIVE Sensitive     Inducible Clindamycin NEGATIVE Sensitive     * METHICILLIN RESISTANT STAPHYLOCOCCUS AUREUS  Blood Culture ID Panel (Reflexed)     Status: Abnormal   Collection Time: 04/14/16  1:52 AM  Result Value Ref Range Status   Enterococcus species NOT DETECTED NOT DETECTED Final   Listeria monocytogenes NOT DETECTED NOT DETECTED Final   Staphylococcus species DETECTED (A) NOT DETECTED Final    Comment: CRITICAL RESULT CALLED  TO, READ BACK BY AND VERIFIED WITH: V BRYK,PHARMD @0534  04/15/16 MKELLY,MLT    Staphylococcus aureus DETECTED (A) NOT DETECTED Final    Comment: CRITICAL RESULT CALLED TO, READ BACK BY AND VERIFIED WITH: V BRYK, PHARMD @0534  04/15/16 MKELLY,MLT    Methicillin resistance DETECTED (A) NOT DETECTED Final    Comment: CRITICAL RESULT CALLED TO, READ BACK BY AND VERIFIED WITH: V BRYK,PHARMD @0534  04/15/16 MKELLY,MLT    Streptococcus species NOT DETECTED NOT DETECTED Final  Streptococcus agalactiae NOT DETECTED NOT DETECTED Final   Streptococcus pneumoniae NOT DETECTED NOT DETECTED Final   Streptococcus pyogenes NOT DETECTED NOT DETECTED Final   Acinetobacter baumannii NOT DETECTED NOT DETECTED Final   Enterobacteriaceae species NOT DETECTED NOT DETECTED Final   Enterobacter cloacae complex NOT DETECTED NOT DETECTED Final   Escherichia coli NOT DETECTED NOT DETECTED Final   Klebsiella oxytoca NOT DETECTED NOT DETECTED Final   Klebsiella pneumoniae NOT DETECTED NOT DETECTED Final   Proteus species NOT DETECTED NOT DETECTED Final   Serratia marcescens NOT DETECTED NOT DETECTED Final   Haemophilus influenzae NOT DETECTED NOT DETECTED Final   Neisseria meningitidis NOT DETECTED NOT DETECTED Final   Pseudomonas aeruginosa NOT DETECTED NOT DETECTED Final   Candida albicans NOT DETECTED NOT DETECTED Final   Candida glabrata NOT DETECTED NOT DETECTED Final   Candida krusei NOT DETECTED NOT DETECTED Final   Candida parapsilosis NOT DETECTED NOT DETECTED Final   Candida tropicalis NOT DETECTED NOT DETECTED Final    Comment: Performed at [facility]  Aerobic/Anaerobic Culture (surgical/deep wound)     Status: None (Preliminary result)   Collection Time: 04/14/16  9:00 AM  Result Value Ref Range Status   Specimen Description ABSCESS RIGHT HAND  Final   Special Requests NONE  Final   Gram Stain   Final    FEW WBC PRESENT,BOTH PMN AND MONONUCLEAR FEW GRAM POSITIVE COCCI IN PAIRS IN  SINGLES FEW GRAM POSITIVE COCCI IN CLUSTERS    Culture   Final    MODERATE METHICILLIN RESISTANT STAPHYLOCOCCUS AUREUS NO ANAEROBES ISOLATED; CULTURE IN PROGRESS FOR 5 DAYS    Report Status PENDING  Incomplete   Organism ID, Bacteria METHICILLIN RESISTANT STAPHYLOCOCCUS AUREUS  Final      Susceptibility   Methicillin resistant staphylococcus aureus - MIC*    CIPROFLOXACIN >=8 RESISTANT Resistant     ERYTHROMYCIN >=8 RESISTANT Resistant     GENTAMICIN <=0.5 SENSITIVE Sensitive     OXACILLIN >=4 RESISTANT Resistant     TETRACYCLINE <=1 SENSITIVE Sensitive     VANCOMYCIN <=0.5 SENSITIVE Sensitive     TRIMETH/SULFA <=10 SENSITIVE Sensitive     CLINDAMYCIN <=0.25 SENSITIVE Sensitive     RIFAMPIN <=0.5 SENSITIVE Sensitive     Inducible Clindamycin NEGATIVE Sensitive     * MODERATE METHICILLIN RESISTANT STAPHYLOCOCCUS AUREUS  Culture, blood (routine x 2)     Status: None (Preliminary result)   Collection Time: 04/15/16 12:30 PM  Result Value Ref Range Status   Specimen Description BLOOD RIGHT ANTECUBITAL  Final   Special Requests BOTTLES DRAWN AEROBIC ONLY 5CC  Final   Culture NO GROWTH 3 DAYS  Final   Report Status PENDING  Incomplete  Culture, blood (routine x 2)     Status: None (Preliminary result)   Collection Time: 04/15/16 12:35 PM  Result Value Ref Range Status   Specimen Description BLOOD LEFT HAND  Final   Special Requests BOTTLES DRAWN AEROBIC ONLY 5CC  Final   Culture NO GROWTH 3 DAYS  Final   Report Status PENDING  Incomplete  Culture, expectorated sputum-assessment     Status: None   Collection Time: 04/16/16  6:31 AM  Result Value Ref Range Status   Specimen Description EXPECTORATED SPUTUM  Final   Special Requests NONE  Final   Sputum evaluation   Final    THIS SPECIMEN IS ACCEPTABLE. RESPIRATORY CULTURE REPORT TO FOLLOW.   Report Status 04/16/2016 FINAL  Final  Culture, respiratory (NON-Expectorated)     Status:  None   Collection Time: 04/16/16  1:39 PM  Result  Value Ref Range Status   Specimen Description EXPECTORATED SPUTUM  Final   Special Requests NONE  Final   Gram Stain   Final    ABUNDANT WBC PRESENT, PREDOMINANTLY PMN FEW SQUAMOUS EPITHELIAL CELLS PRESENT FEW GRAM POSITIVE COCCI IN PAIRS IN CLUSTERS RARE GRAM POSITIVE RODS    Culture   Final    MODERATE METHICILLIN RESISTANT STAPHYLOCOCCUS AUREUS   Report Status 04/18/2016 FINAL  Final   Organism ID, Bacteria METHICILLIN RESISTANT STAPHYLOCOCCUS AUREUS  Final      Susceptibility   Methicillin resistant staphylococcus aureus - MIC*    CIPROFLOXACIN >=8 RESISTANT Resistant     ERYTHROMYCIN >=8 RESISTANT Resistant     GENTAMICIN <=0.5 SENSITIVE Sensitive     OXACILLIN >=4 RESISTANT Resistant     TETRACYCLINE <=1 SENSITIVE Sensitive     VANCOMYCIN <=0.5 SENSITIVE Sensitive     TRIMETH/SULFA <=10 SENSITIVE Sensitive     CLINDAMYCIN <=0.25 SENSITIVE Sensitive     RIFAMPIN <=0.5 SENSITIVE Sensitive     Inducible Clindamycin NEGATIVE Sensitive     * MODERATE METHICILLIN RESISTANT STAPHYLOCOCCUS AUREUS    Studies/Results: US Abdomen Complete  Result Date: 04/17/2016 CLINICAL DATA:  Cirrhosis. EXAM: ABDOMEN ULTRASOUND COMPLETE COMPARISON:  02/24/12 FINDINGS: Gallbladder: Surgically absent Common bile duct: Diameter: 11 mm. Where visualized, no filling defect. Liver: Cirrhotic morphology. No evidence of mass. Antegrade flow in the imaged portal venous system. IVC: No abnormality visualized. Pancreas: Visualized portion unremarkable. Spleen: Enlarged with a 460 cc volume. Right Kidney: Length: 11 cm. Echogenicity within normal limits. No mass or hydronephrosis visualized. Left Kidney: Length: 12 cm. Echogenicity within normal limits. No mass or hydronephrosis visualized. Abdominal aorta: No aneurysm visualized. Other findings: No significant ascites. IMPRESSION: 1. No acute finding. 2. Cirrhosis with splenomegaly.  No ascites. 3. Cholecystectomy with 11 mm common bile duct. Electronically Signed    By: Monte Fantasia M.D.   On: 04/17/2016 22:19      Assessment/Plan:  INTERVAL HISTORY:  Sclera blood cultures  She developed a GI bleed is being worked up by gastroenterology   Principal Problem:   Cellulitis of right hand Active Problems:   Community acquired pneumonia of right lower lobe of lung ([place])   Essential hypertension   MRSA bacteremia   Abscess of hand, right   Bipolar affective disorder ([place])   Upper GI bleed   Cirrhosis of liver without ascites ([facility])    [name] is a 55 y.o. female with  bipolar/schizophrenia/illicit drug use admitted for MRSA hand abscess with secondary bacteremia and pneumonia.  #1 MRSA hand infection and bacteremia, pneumonia:  She is unwilling to be discharged to a skilled nursing facility to have IV antibiotics in a skilled nursing facility.  He does not have vegetations by 2-D echocardiogram.   Due to her drug use and her on willingness to be discharged to skilled nursing facility I'm going to resort to using a long-acting agent with her.  We will dose her with ORITAVANCIN   Which will be in her system for at least 2 weeks.  We can then arrange for her to have a second dose of either this antibiotic or DALBAVANCIN  zyvox not an option given her thrombocytopenia   If she cannot make the appointment for the second infusion in 2 weeks we can use oral doxycycline.  I wouldn't trust trying to use high-dose Bactrim and her or adjunctive rifampin  One could get a  transesophageal echocardiogram but I don't think it's worth the risk as she would not be an operative candidate and I'm going to try to cobble together several weeks of therapy using long-acting agents and oral agents if she will comply with them if possible through 6 weeks  I will work with pharmacy to get her first dose of ORITAVANCIN  day or 2 more ON to arrange a second dose in 2 weeks time as an outpatient.  Note please discontinue her PICC line prior to  discharging the patient.  M discontinue her Zosyn I don't see what she needs this for with the unifying diagnosis of severe MRSA infection  I will arrange for hospital follow-up in our clinic both with pharmacy and with IDDM D   I'll otherwise sign off for now please call back with further questions.  LOS: 4 days   Alcide Evener 04/18/2016, 3:53 PM

## 2016-04-18 NOTE — Op Note (Signed)
[facility] Patient Name: [name] Procedure Date : 04/18/2016 MRN: [id number] Attending MD: Carol Ada , MD Date of Birth: [birth year] CSN: [id number] Age: 55 Admit Type: Inpatient Procedure:                Upper GI endoscopy Indications:              Heme positive stool, Melena Providers:                Carol Ada, MD, Laverta Baltimore RN, RN, Corliss Parish, Technician Referring MD:              Medicines:                Fentanyl 50 micrograms IV, Midazolam 4 mg IV,                            Diphenhydramine 25 mg IV Complications:            No immediate complications. Estimated Blood Loss:     Estimated blood loss: none. Procedure:                Pre-Anesthesia Assessment:                           - Prior to the procedure, a History and Physical                            was performed, and patient medications and                            allergies were reviewed. The patient's tolerance of                            previous anesthesia was also reviewed. The risks                            and benefits of the procedure and the sedation                            options and risks were discussed with the patient.                            All questions were answered, and informed consent                            was obtained. Prior Anticoagulants: The patient has                            taken no previous anticoagulant or antiplatelet                            agents. ASA Grade Assessment: III - A patient with  severe systemic disease. After reviewing the risks                            and benefits, the patient was deemed in                            satisfactory condition to undergo the procedure.                           After obtaining informed consent, the endoscope was                            passed under direct vision. Throughout the                            procedure, the patient's  blood pressure, pulse, and                            oxygen saturations were monitored continuously. The                            [id number]) scope was introduced through the                            mouth, and advanced to the second part of duodenum.                            The upper GI endoscopy was accomplished without                            difficulty. The patient tolerated the procedure                            well. Scope In: Scope Out: Findings:      LA Grade B (one or more mucosal breaks greater than 5 mm, not extending       between the tops of two mucosal folds) esophagitis with no bleeding was       found.      A 2 cm hiatal hernia was present.      Three non-bleeding cratered and linear gastric ulcers with no stigmata       of bleeding were found in the gastric antrum, in the prepyloric region       of the stomach and at the pylorus. Biopsies were taken with a cold       forceps for histology.      Mild portal hypertensive gastropathy was found in the gastric fundus and       in the gastric body. Estimated blood loss: none.      Three non-bleeding superficial duodenal ulcers with no stigmata of       bleeding were found in the duodenal bulb. The largest lesion was 8 mm in       largest dimension.      There was an LA Grade A versus B esophagitis. It was difficult to       discern as the area did not relax very well.  In the gastric lumen there       was a moderate amount of retained gastric contents and fluid which was       suctioned. Only the larger pieces of vegetable matter remained. NO       evidence of esophageal varices or fundic varices. A mild portal HTN       gastropathy was identified. In the antrum and pylorus one ulcer was       large and confluent. The ulcer lead into the pylorus. Impression:               - LA Grade B reflux esophagitis.                           - 2 cm hiatal hernia.                           - Non-bleeding gastric  ulcers with no stigmata of                            bleeding. Biopsied.                           - Portal hypertensive gastropathy.                           - Multiple non-bleeding duodenal ulcers with no                            stigmata of bleeding. Moderate Sedation:      Moderate (conscious) sedation was administered by the endoscopy nurse       and supervised by the endoscopist. The following parameters were       monitored: oxygen saturation, heart rate, blood pressure, and response       to care. Recommendation:           - Return patient to hospital ward for ongoing care.                           - Resume previous diet.                           - Continue present medications.                           - Await pathology results.                           - Change PPI from IV to oral.                           - Stop octreotide. Procedure Code(s):        --- Professional ---                           [phone], Esophagogastroduodenoscopy, flexible,                            transoral; with biopsy, single or multiple Diagnosis Code(s):        ---  Professional ---                           K21.0, Gastro-esophageal reflux disease with                            esophagitis                           K44.9, Diaphragmatic hernia without obstruction or                            gangrene                           K25.9, Gastric ulcer, unspecified as acute or                            chronic, without hemorrhage or perforation                           K76.6, Portal hypertension                           K31.89, Other diseases of stomach and duodenum                           K26.9, Duodenal ulcer, unspecified as acute or                            chronic, without hemorrhage or perforation                           R19.5, Other fecal abnormalities                           K92.1, Melena (includes Hematochezia) CPT copyright 2016 [employer]. All rights  reserved. The codes documented in this report are preliminary and upon coder review may  be revised to meet current compliance requirements. Carol Ada, MD Carol Ada, MD 04/18/2016 10:46:21 AM This report has been signed electronically. Number of Addenda: 0

## 2016-04-18 NOTE — Progress Notes (Signed)
Physical Therapy Wound Treatment Patient Details  Name: [name] MRN: [id number] Date of Birth: 13-Mar-1961  Today's Date: 04/18/2016 Time: 2536-6440 Time Calculation (min): 25 min  Subjective  Subjective: "It feels better after you do that," pt stated about pulsatile lavage. Patient and Family Stated Goals: get hand better Date of Onset:  (few days ago) Prior Treatments: Surgical I&D  Pain Score: Pain Score: Pt premedicated. Minimal pain.   Wound Assessment  Wound / Incision (Open or Dehisced) 04/16/16 Incision - Open Hand Right;Posterior;Lateral thenar eminence (Active)  Dressing Type Gauze (Comment);Compression wrap 04/18/2016  3:58 PM  Dressing Changed Changed 04/18/2016  3:58 PM  Dressing Status Clean;Dry;Intact 04/18/2016  3:58 PM  Dressing Change Frequency Daily 04/18/2016  3:58 PM  Site / Wound Assessment Red;Yellow 04/18/2016  3:58 PM  % Wound base Red or Granulating 95% 04/18/2016  3:58 PM  % Wound base Yellow/Fibrinous Exudate 5% 04/18/2016  3:58 PM  % Wound base Black/Eschar 0% 04/18/2016  3:58 PM  % Wound base Other/Granulation Tissue (Comment) 0% 04/18/2016  3:58 PM  Peri-wound Assessment Erythema (blanchable);Edema 04/18/2016  3:58 PM  Wound Length (cm) 4 cm 04/16/2016  9:00 AM  Wound Width (cm) 0.5 cm 04/16/2016  9:00 AM  Wound Depth (cm) 1.5 cm 04/16/2016  9:00 AM  Margins Unattached edges (unapproximated) 04/18/2016  3:58 PM  Closure None 04/18/2016  3:58 PM  Drainage Amount Minimal 04/18/2016  3:58 PM  Drainage Description Serosanguineous 04/18/2016  3:58 PM  Treatment Hydrotherapy (Pulse lavage);Packing (Impregnated strip) 04/18/2016  3:58 PM     Wound / Incision (Open or Dehisced) 04/16/16 Incision - Open Hand Anterior;Lateral;Right thenar eminence (Active)  Dressing Type Compression wrap;Gauze (Comment) 04/18/2016  3:58 PM  Dressing Changed Changed 04/18/2016  3:58 PM  Dressing Status Clean;Dry;Intact 04/18/2016  3:58 PM  Dressing Change Frequency  Daily 04/18/2016  3:58 PM  Site / Wound Assessment Red;Yellow 04/18/2016  3:58 PM  % Wound base Red or Granulating 50% 04/18/2016  3:58 PM  % Wound base Yellow/Fibrinous Exudate 50% 04/18/2016  3:58 PM  % Wound base Black/Eschar 0% 04/18/2016  3:58 PM  % Wound base Other/Granulation Tissue (Comment) 0% 04/18/2016  3:58 PM  Peri-wound Assessment Erythema (blanchable);Edema 04/18/2016  3:58 PM  Wound Length (cm) 2.5 cm 04/16/2016  9:00 AM  Wound Width (cm) 1.5 cm 04/16/2016  9:00 AM  Wound Depth (cm) 0.2 cm 04/16/2016  9:00 AM  Undermining (cm) .3 at 8-9 o'clock; .2 at 3-4 oclock 04/16/2016  9:00 AM  Margins Unattached edges (unapproximated) 04/18/2016  3:58 PM  Closure None 04/18/2016  3:58 PM  Drainage Amount Minimal 04/18/2016  3:58 PM  Drainage Description Serosanguineous 04/18/2016  3:58 PM  Treatment Hydrotherapy (Pulse lavage);Packing (Saline gauze) 04/18/2016  3:58 PM   Hydrotherapy Pulsed lavage therapy - wound location: rt thenar eminence Pulsed Lavage with Suction (psi): 8 psi Pulsed Lavage with Suction - Normal Saline Used: 1000 mL Pulsed Lavage Tip: Tip with splash shield   Wound Assessment and Plan  Wound Therapy - Assess/Plan/Recommendations Wound Therapy - Clinical Statement: Pt with thin layer of yellow slough over anterior wound. Continue hydrotherapy for removal of necrotic tissue and to decrease bioburden. Pt encouraged to continue to perform rt hand ROM exercises. Wound Therapy - Functional Problem List: Decr use of rt hand Factors Delaying/Impairing Wound Healing: Infection - systemic/local;Substance abuse;Multiple medical problems Hydrotherapy Plan: Debridement;Dressing change;Patient/family education;Pulsatile lavage with suction Wound Therapy - Frequency: 6X / week Wound Therapy - Follow Up Recommendations: Other (comment);Home health RN (  hand surgeon office vs [place]) Wound Plan: See above  Wound Therapy Goals- Improve the function of patient's integumentary  system by progressing the wound(s) through the phases of wound healing (inflammation - proliferation - remodeling) by: Decrease Necrotic Tissue to: 0 Decrease Necrotic Tissue - Progress: Not progressing Increase Granulation Tissue to: 100 Increase Granulation Tissue - Progress: Mot progressing  Goals will be updated until maximal potential achieved or discharge criteria met.  Discharge criteria: when goals achieved, discharge from hospital, MD decision/surgical intervention, no progress towards goals, refusal/missing three consecutive treatments without notification or medical reason.  GP     [place] 04/18/2016, 4:05 PM [place]

## 2016-04-18 NOTE — Plan of Care (Signed)
Problem: Bowel/Gastric: Goal: Gastrointestinal status for postoperative course will improve Outcome: Progressing No gastric or bowel issues reported  Problem: Respiratory: Goal: Ability to maintain adequate ventilation will improve Outcome: Not Progressing Coughing non productive cough intermittently  Problem: Pain Management: Goal: Pain level will decrease Outcome: Not Progressing Medicated frequently for pain with mild relief

## 2016-04-18 NOTE — H&P (View-Only) (Signed)
Referring Provider: [facility]  Primary Care Physician:  [facility] Primary Gastroenterologist:  Juanita Craver, MD  Reason for Consultation:  GI bleed  HPI: [name] is a 55 y.o. female with hx of bipolar disorder, ADHD, and IV drug use admitted 3 days ago with cellulitis of right hand and ? RLL PNA. She underwent I&D of right hand abscess on daily of admission. Found to be bacteremic, ID following.   Patient complains of right sided abdominal pain which started after vigorous coughing a few days piror to admission. She coughs from sinus drainage. The pain involves whole right abdomen radiating around to right flank. Pain worse with movement. She doesn't relate pain to eating. No nausea. Began having black watery stools a couple of days ago. No bismuth use. Denies NSAIDs.    Past Medical History:  Diagnosis Date  . Anxiety   . Bipolar affective disorder ([place])    ADHD AND SCHZIOPHRENIC--PT GOES TO [place]. BLANKMAN  . Blood transfusion without reported diagnosis   . Cervical radiculopathy   . Chronic pain syndrome   . Complication of anesthesia    STATES SHE WOKE UP TWICE DURING SURGERIES  . DJD (degenerative joint disease)   . GERD (gastroesophageal reflux disease)   . H/O hiatal hernia   . Hepatitis C infection    STATES COMPLETED TREATMENT AND NO LONGER HAS INFECTION  . Hypertension   . Leukocytopenia   . Osteoarthritis   . Osteoporosis   . Peptic ulcer disease   . Pneumonia 2013  . Polysubstance (excluding opioids) dependence ([place])   . Seizures ([facility])    SEIZURE AFTER BREATHING TX (ALBUTEROL) IN ER; ALSO PAST HX SEIZURES DURING DRUG DETOX  . Shortness of breath    WITH EXERTION; SMOKER  . Thrombocytopenia ([place])     Past Surgical History:  Procedure Laterality Date  . ABDOMINAL HYSTERECTOMY    . APPENDECTOMY    . CHOLECYSTECTOMY    . Hyperectomy     due to obstructive ovarian cytst  . I&D EXTREMITY Right 04/14/2016   Procedure: IRRIGATION AND DEBRIDEMENT EXTREMITY;  Surgeon: Leanora Cover, MD;  Location: [facility];  Service: Orthopedics;  Laterality: Right;  . JOINT REPLACEMENT  2012   RIGHT TOTAL HIP REPLACEMENT  . Right knee arthroscopic surgery    . SPINE SURGERY     CERVIAL 6-7 SURGERY - FUSION  . TONSILECTOMY, ADENOIDECTOMY, BILATERAL MYRINGOTOMY AND TUBES    . TOTAL HIP ARTHROPLASTY Left 08/24/2012   Procedure: LEFT TOTAL HIP ARTHROPLASTY ANTERIOR APPROACH;  Surgeon: [name], MD;  Location: [facility];  Service: Orthopedics;  Laterality: Left;  . Total hip replacement      Prior to Admission medications   Medication Sig Start Date End Date Taking? Authorizing Provider  ALPRAZolam Duanne Moron) 1 MG tablet Take 1 mg by mouth 2 (two) times daily as needed for anxiety.  03/19/16  Yes Historical Provider, MD  amphetamine-dextroamphetamine (ADDERALL) 10 MG tablet Take 10 mg by mouth 2 (two) times daily. 03/28/16  Yes Historical Provider, MD  amphetamine-dextroamphetamine (ADDERALL) 20 MG tablet Take 20 mg by mouth 3 (three) times daily. 05/14/16  Yes Historical Provider, MD  lisinopril (PRINIVIL,ZESTRIL) 5 MG tablet Take 1 tablet (5 mg total) by mouth daily. 08/14/14  Yes Freeman Caldron Baker, PA-C  naproxen sodium (ANAPROX) 220 MG tablet Take 440 mg by mouth every 12 (twelve) hours as needed (pain).    Yes Historical Provider, MD  QUEtiapine (SEROQUEL) 200 MG  tablet Take 200 mg by mouth at bedtime as needed (mood, agitation).  03/19/16  Yes Historical Provider, MD    Current Facility-Administered Medications  Medication Dose Route Frequency Provider Last Rate Last Dose  . alum & mag hydroxide-simeth (MAALOX/MYLANTA) 200-200-20 MG/5ML suspension 30 mL  30 mL Oral Q6H PRN Mariel Aloe, MD   30 mL at 04/16/16 0224  . diphenhydrAMINE (BENADRYL) capsule 25-50 mg  25-50 mg Oral Q6H PRN Leanora Cover, MD      . feeding supplement (ENSURE ENLIVE) (ENSURE ENLIVE) liquid 237 mL  237 mL Oral BID PC Mariel Aloe, MD   237 mL at  04/16/16 0912  . guaiFENesin-dextromethorphan (ROBITUSSIN DM) 100-10 MG/5ML syrup 5 mL  5 mL Oral Q4H PRN Etta Quill, DO   5 mL at 04/14/16 2121  . Influenza vac split quadrivalent PF (FLUARIX) injection 0.5 mL  0.5 mL Intramuscular Tomorrow-1000 Mariel Aloe, MD      . lactated ringers infusion   Intravenous Continuous Modena Jansky, MD 50 mL/hr at 04/17/16 0400    . lidocaine (LIDODERM) 5 % 1 patch  1 patch Transdermal Q24H Modena Jansky, MD   1 patch at 04/15/16 0138  . lisinopril (PRINIVIL,ZESTRIL) tablet 5 mg  5 mg Oral Daily Etta Quill, DO   5 mg at 04/16/16 0912  . methocarbamol (ROBAXIN) tablet 500 mg  500 mg Oral Q6H PRN Leanora Cover, MD   500 mg at 04/14/16 1729   Or  . methocarbamol (ROBAXIN) 500 mg in dextrose 5 % 50 mL IVPB  500 mg Intravenous Q6H PRN Leanora Cover, MD      . morphine 4 MG/ML injection 4 mg  4 mg Intravenous Q2H PRN Modena Jansky, MD      . multivitamin with minerals tablet 1 tablet  1 tablet Oral Daily Mariel Aloe, MD   1 tablet at 04/16/16 0912  . ondansetron (ZOFRAN) tablet 4 mg  4 mg Oral Q6H PRN Leanora Cover, MD       Or  . ondansetron [facility]) injection 4 mg  4 mg Intravenous Q6H PRN Leanora Cover, MD   4 mg at 04/16/16 0906  . oxyCODONE-acetaminophen (PERCOCET) 7.5-325 MG per tablet 1 tablet  1 tablet Oral Q4H PRN Modena Jansky, MD      . pantoprazole (PROTONIX) injection 40 mg  40 mg Intravenous Q12H Modena Jansky, MD   40 mg at 04/16/16 2256  . piperacillin-tazobactam (ZOSYN) IVPB 3.375 g  3.375 g Intravenous Q8H Leann T Poindexter, RPH   3.375 g at 04/17/16 0528  . QUEtiapine (SEROQUEL) tablet 200 mg  200 mg Oral QHS PRN Etta Quill, DO      . vancomycin (VANCOCIN) IVPB 1000 mg/200 mL premix  1,000 mg Intravenous Q12H Jaquita Folds, RPH   1,000 mg at 04/17/16 0202  . vitamin C (ASCORBIC ACID) tablet 1,000 mg  1,000 mg Oral Daily Leanora Cover, MD   1,000 mg at 04/16/16 0912    Allergies as of 04/13/2016 - Review Complete  04/13/2016  Allergen Reaction Noted  . Albuterol Other (See Comments) 01/11/2012  . Aspirin Hives 05/14/2011  . Adhesive [tape] Rash 08/11/2012    Family History  Problem Relation Age of Onset  . Breast cancer Sister   . Cancer Sister   . Heart disease Mother   . Heart disease Father     Social History   Social History  . Marital status: Divorced  Spouse name: N/A  . Number of children: N/A  . Years of education: N/A   Occupational History  . Not on file.   Social History Main Topics  . Smoking status: Current Some Day Smoker    Packs/day: 0.50    Years: 20.00    Types: Cigarettes  . Smokeless tobacco: Never Used  . Alcohol use No     Comment: HX OF ABUSE OF PAIN MEDS--PT IS CURRENTLY IN TREATMENT AT [facility]  . Drug use: No  . Sexual activity: Not Currently    Review of Systems: All systems reviewed and negative except where noted in HPI.  Physical Exam: Vital signs in last 24 hours: Temp:  [98.7 F (37.1 C)-100.8 F (38.2 C)] 98.7 F (37.1 C) (11/23 0500) Pulse Rate:  [92-109] 92 (11/23 0500) Resp:  [18] 18 (11/23 0500) BP: (141-169)/(57-69) 145/68 (11/23 0500) SpO2:  [96 %-98 %] 96 % (11/23 0500) Last BM Date: 04/13/16 General:   Thin, white female in NAD Head:  Normocephalic and atraumatic. Eyes:  Sclera clear, no icterus.   Conjunctiva pink. Ears:  Normal auditory acuity. Nose:  No deformity, discharge,  or lesions. Mouth:  No deformity or lesions.   Neck:  Supple; no masses or thyromegaly. Lungs:  Clear throughout to auscultation.   No wheezes, crackles, or rhonchi.  Heart:  Regular rate and rhythm; no murmurs, no lower extremity edema. Abdomen:  Soft,nontender, BS active,nonpalp mass or hsm.   Rectal:  Traces of black stool in vault  Msk:  Symmetrical without gross deformities. . Pulses:  Normal pulses noted. Extremities:  LUE swelling at IV site. Neurologic:  Alert and  oriented x4;  grossly normal  neurologically. Skin:  Intact without significant lesions or rashes.. Psych:  Alert and cooperative. Normal mood and affect.  Intake/Output from previous day: 11/22 0701 - 11/23 0700 In: 1740 [P.O.:840; I.V.:650; IV Piggyback:250] Out: -  Intake/Output this shift: No intake/output data recorded.  Lab Results:  Recent Labs  04/16/16 1400 04/16/16 2253 04/17/16 0552  WBC 6.9 5.6 4.8  HGB 11.2* 10.0* 9.4*  HCT 33.6* 30.5* 28.9*  PLT 122* 127* 114*   BMET  Recent Labs  04/14/16 1047 04/16/16 0644  NA  --  133*  K  --  4.2  CL  --  101  CO2  --  25  GLUCOSE  --  116*  BUN  --  16  CREATININE 0.96 0.81  CALCIUM  --  8.6*   PT/INR  Recent Labs  04/16/16 1533  LABPROT 15.2  INR 1.20    Studies/Results: Dg Chest 2 View  Result Date: 04/16/2016 CLINICAL DATA:  Right side chest pain.  Pneumonia. EXAM: CHEST  2 VIEW COMPARISON:  04/14/2016 FINDINGS: Increasing opacity at the right lung base concerning for pneumonia. Small right effusion. No confluent opacity on the left. Heart is normal size. Small hiatal hernia. No acute bony abnormality. IMPRESSION: Increasing right lower lobe airspace opacity with right effusion compatible with pneumonia. Small hiatal hernia. Electronically Signed   By: Rolm Baptise M.D.   On: 04/16/2016 12:09    IMPRESSION / PLAN:   1. Upper GI bleed with melena. This is a 55 yo female with HCV, cirrhosis, and IV drug use admitted with right hand cellulitis s/p I&D. Now with melena. She has bee on Lovenox for DVT prophylaxis. Bleeding possibly from portal HTN, doubt variceal bleed. No NSAID use but PUD still consideration. She complains of diffuse right sided abdominal pain  but suspect that pain is unrelated and more musculoskeletal in nature -IV has infiltrated. RN contacting IV team.  -Patient stable. Will schedule her for EGD to be done by Dr. Benson Norway tomorrow. If condition starts to deteriorate then will do emergent EGD today -Serial CBCs -Clears  okay, NPO after midnight -continue BID IV PPI -Will add octreotide for precautionary measures though seems unlikely this is a variceal bleed -SBP prophylaxis - already on Zosyn / vancomycin for bacteremia  2. Anemia of acute blood loss. Hgb 13 on admit, down to 9.4 today.  -serial CBC, next one due at 1pm  3. 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 Patient thinks she cleared virus after [facility] at [facility] but did not. HCV Quant is 2 million. No obvious ascites on exam. Not encephalopathic. Coags normal. Platelet 114K -AFP -Ultrasound for HCC screening.   4. Illicit drug UDS + cocaine  Drs. Collene Mares and Benson Norway will resume patient's care tomorrow (patient known to Dr. Collene Mares)   Tye Savoy  04/17/2016, 9:31 AM  Pager number [phone]

## 2016-04-18 NOTE — Progress Notes (Signed)
PROGRESS NOTE    [name]  [id number] DOB: 09/18/1960 DOA: 04/13/2016 PCP: [facility]   Brief Narrative: [name] is a 55 y.o. female with medical history significant of IVDU, chronic pain, bipolar disorder, ADHD, anxiety, polysubstance abuse, hepatitis C, thrombocytopenia, HTN, PUD who presented with right hand swelling, found to have an abscess. She is s/p incision and drainage by hand surgery on 04/14/16 but found to have likely MRSA bacteremia (1/2 bottles positive for gram positive staph so far). Hand surgery and infectious disease consulting. Reported dark watery stools 4 on 11/22> suspicious for upper GI bleed. Mild acute blood loss anemia. [facility] (covering for primary GI Dr. Collene Mares) consulted 11/23 and a s/p EGD 11/24.   Assessment & Plan:   Principal Problem:   Cellulitis of right hand Active Problems:   Community acquired pneumonia of right lower lobe of lung ([place])   Essential hypertension   MRSA bacteremia   Arm abscess   Bipolar affective disorder ([place])   Upper GI bleed   Cirrhosis of liver without ascites (H[facility])   Polysubstance abuse   MRSA Right hand abscess with secondary bacteremia S/p I&D in the OR by Dr. Fredna Dow on 04/14/16. ID consultation and follow-up appreciated. She was initially treated with IV vancomycin. Surveillance blood cultures 2 are negative to date. TTE 11/23 does not show vegetations. As per ID follow-up on 11/24, patient unwilling to go to SNF for prolonged IV antibiotics and given her history of IVDA, sending her home with a PICC line is not a safe option and hence ID transitioning her to long-acting agent/Oritavancin and will arrange outpatient follow-up for second dose of antibiotic in 2 weeks. TEE not felt necessary at this time. - 1 of 2 blood cultures 04/14/16 shows MRSA. Blood cultures 2 of 04/15/16: Negative to date. Right hand abscess culture 04/14/16 confirms MRSA  MRSA Community acquired pneumonia/pleurisy Patient  had tachycardia, which has improved, pleuritic/musculoskeletal type chest pain and is on room air. PE on differential however, Wells Score: 1.5 (for tachycardia which has now improved). Treated initially with IV vancomycin and Zosyn. As of today, ID has stopped these antibiotics and transitioning to Oritavancin as indicated above Urine streptococcal antigen negative. Urine Legionella antigen negative. HIV antibodies negative. - Repeat chest x-ray 11/22: Increasing right lower lobe airspace opacity with right effusion compatible with pneumonia. - Clinically improved with improved symptoms and pain management.  MRSA bacteremia 1/2 bottles positive for MRSA. This is possibly secondary to CAP vs abscess vs IV drug abuse - Antibiotic management as above per ID recommendations. -echocardiogram requested 11/22 negative for vegetations  IV drug use/cocaine abuse Patient has a remote history of amphetamine use via IV. She admits to using methamphetamines via IV in addition to Seroquel via IV a few days ago. UDS significant for amphetamines and cocaine. UDS on admission positive for amphetamines and cocaine. Reluctant to send her home with PICC line given history of IVDA.  Bipolar disorder -continue Seroquel  Hypertension -continue lisinopril. Controlled.  Acute upper GI bleed - As per RN, patient had 4 episodes of loose/watery dark stools since 11/22 morning. FOBT +. Discontinued NSAIDs and Lovenox DVT prophylaxis. Start IV PPI. Overnight 11/23 had one or 2 more episodes of black stools. Hemoglobin has dropped approximately 2 g. Differential diagnosis: Esophageal varices, portal gastropathy (has this in the past), esophagitis, ulcers etc. [facility] consulted. Octreotide added. Status post EGD 11/24. Results as below. GI bleed has resolved. GI recommends regular consistency diet. Octreotide discontinued.  Acute blood loss anemia - Hemoglobin has dropped approximately 2 g since yesterday. Continue to  trend CBCs and transfuse if hemoglobin less than 7 g per DL. Hemoglobin stable.  Thrombocytopenia  - stable.? Related to hepatitis C cirrhosis.  Chronic hepatitis C with cirrhosis without hepatic coma - Ultrasound 2013 confirm cirrhosis. HCV viral load: 2,310,000. HIV antibody testing negative. Outpatient follow-up with ID for further management.   DVT prophylaxis: Lovenox-discontinued and started SCD on 11/22. Code Status: Full code Family Communication: None at bedside Disposition Plan: Possible DC home in the next 1-2 days.   Consultants:   Hand surgery  Infectious disease  GI  Procedures:   I&D of right hand abscess (11/20)  PICC line (placed due to difficult stick (scratch that)   2-D echo 04/17/16: Study Conclusions  - Left ventricle: The cavity size was normal. Wall thickness was   increased in a pattern of mild LVH. Systolic function was normal.   The estimated ejection fraction was in the range of 55% to 60%.   Wall motion was normal; there were no regional wall motion   abnormalities. Doppler parameters are consistent with abnormal   left ventricular relaxation (grade 1 diastolic dysfunction). - Left atrium: The atrium was mildly dilated. - Right atrium: Central venous pressure (est): 3 mm Hg. - Tricuspid valve: There was trivial regurgitation. - Pulmonary arteries: PA peak pressure: 11 mm Hg (S). - Pericardium, extracardiac: There was no pericardial effusion.  Impressions:  - Mild LVH with LVEF 55-60%. Grade 1 diastolic dysfunction. Mild   left atrial enlargement. Trivial tricuspid regurgitation with   normal estimated PASP. No obvious valvular vegetations.   EGD 04/18/16: Impression:               - LA Grade B reflux esophagitis.                           - 2 cm hiatal hernia.                           - Non-bleeding gastric ulcers with no stigmata of                            bleeding. Biopsied.                           - Portal hypertensive  gastropathy.                           - Multiple non-bleeding duodenal ulcers with no                            stigmata of bleeding. Moderate Sedation:      Moderate (conscious) sedation was administered by the endoscopy nurse       and supervised by the endoscopist. The following parameters were       monitored: oxygen saturation, heart rate, blood pressure, and response       to care. Recommendation:           - Return patient to hospital ward for ongoing care.                           - Resume previous diet.                           -  Continue present medications.                           - Await pathology results.                           - Change PPI from IV to oral.                           - Stop octreotide.  Antimicrobials:   Vancomycin (11/20>>11/24   Zosyn (11/20>>11/24     Subjective: Seen post EGD. Right-sided chest pain better controlled. No other complaints reported. As per RN no acute issues.  Objective: Vitals:   04/18/16 1034 04/18/16 1040 04/18/16 1050 04/18/16 1331  BP: (!) 185/76 (!) 188/90 (!) 196/83 (!) 151/73  Pulse: 89 87 86 98  Resp: 13 14 19    Temp:    99.4 F (37.4 C)  TempSrc: Oral   Axillary  SpO2: 92% 97% 97% 94%  Weight:      Height:        Intake/Output Summary (Last 24 hours) at 04/18/16 1651 Last data filed at 04/18/16 1511  Gross per 24 hour  Intake          2584.17 ml  Output                0 ml  Net          2584.17 ml   Filed Weights   04/13/16 2354 04/14/16 0547  Weight: 72.6 kg (160 lb) 72.5 kg (159 lb 13.3 oz)    Examination:  General exam: Pleasant middle-aged female lying comfortably propped up in bed and in no obvious distress.  Respiratory system: diminished breath sounds at right lower lung with? Bronchial breath sounds and occasional crackles . Clear everywhere else. Respiratory effort normal. No pleural rub appreciated. Improving breath sounds in right lung fields. Cardiovascular system: S1 & S2 heard, RRR.  No murmurs, rubs, gallops or clicks. Gastrointestinal system: Abdomen is nondistended, soft and nontender. Normal bowel sounds heard. Central nervous system: Alert and oriented. No focal neurological deficits. Extremities: No edema. No calf tenderness Skin: No cyanosis. No rashes Psychiatry: Judgement and insight appear normal. Mood & affect appropriate.     Data Reviewed: I have personally reviewed following labs and imaging studies  CBC:  Recent Labs Lab 04/14/16 0152  04/16/16 2253 04/17/16 0552 04/17/16 1408 04/17/16 2119 04/18/16 0500  WBC 11.5*  < > 5.6 4.8 4.9 3.9* 4.4  NEUTROABS 9.1*  --   --   --   --   --   --   HGB 13.0  < > 10.0* 9.4* 9.5* 9.5* 9.8*  HCT 38.4  < > 30.5* 28.9* 28.7* 28.7* 30.0*  MCV 91.2  < > 91.9 91.7 92.3 92.9 94.0  PLT 114*  < > 127* 114* 121* 131* 127*  < > = values in this interval not displayed. Basic Metabolic Panel:  Recent Labs Lab 04/14/16 0305 04/14/16 1047 04/16/16 0644 04/18/16 0500  NA 134*  --  133* 137  K 3.1*  --  4.2 4.4  CL 97*  --  101 101  CO2  --   --  25 29  GLUCOSE 141*  --  116* 116*  BUN 9  --  16 6  CREATININE 0.70 0.96 0.81 0.84  CALCIUM  --   --  8.6* 8.4*  GFR: Estimated Creatinine Clearance: 73.6 mL/min (by C-G formula based on SCr of 0.84 mg/dL). Liver Function Tests:  Recent Labs Lab 04/18/16 0500  AST 47*  ALT 24  ALKPHOS 139*  BILITOT 0.7  PROT 6.0*  ALBUMIN 2.1*   No results for input(s): LIPASE, AMYLASE in the last 168 hours. No results for input(s): AMMONIA in the last 168 hours. Coagulation Profile:  Recent Labs Lab 04/16/16 1533  INR 1.20   Cardiac Enzymes: No results for input(s): CKTOTAL, CKMB, CKMBINDEX, TROPONINI in the last 168 hours. BNP (last 3 results) No results for input(s): PROBNP in the last 8760 hours. HbA1C: No results for input(s): HGBA1C in the last 72 hours. CBG: No results for input(s): GLUCAP in the last 168 hours. Lipid Profile: No results for input(s):  CHOL, HDL, LDLCALC, TRIG, CHOLHDL, LDLDIRECT in the last 72 hours. Thyroid Function Tests: No results for input(s): TSH, T4TOTAL, FREET4, T3FREE, THYROIDAB in the last 72 hours. Anemia Panel: No results for input(s): VITAMINB12, FOLATE, FERRITIN, TIBC, IRON, RETICCTPCT in the last 72 hours. Sepsis Labs: No results for input(s): PROCALCITON, LATICACIDVEN in the last 168 hours.  Recent Results (from the past 240 hour(s))  Blood culture (routine x 2)     Status: None (Preliminary result)   Collection Time: 04/14/16  1:52 AM  Result Value Ref Range Status   Specimen Description BLOOD BLOOD LEFT HAND  Final   Special Requests BOTTLES DRAWN AEROBIC ONLY 4ML  Final   Culture   Final    NO GROWTH 4 DAYS Performed at [facility]    Report Status PENDING  Incomplete  Blood culture (routine x 2)     Status: Abnormal   Collection Time: 04/14/16  1:52 AM  Result Value Ref Range Status   Specimen Description BLOOD BLOOD LEFT FOREARM  Final   Special Requests IN PEDIATRIC BOTTLE 0.5 ML  Final   Culture  Setup Time   Final    GRAM POSITIVE COCCI IN CLUSTERS IN PEDIATRIC BOTTLE V BRYK,PHARMD @0534  04/15/16 MKELLY,MLT Performed at [facility]    Culture METHICILLIN RESISTANT STAPHYLOCOCCUS AUREUS (A)  Final   Report Status 04/17/2016 FINAL  Final   Organism ID, Bacteria METHICILLIN RESISTANT STAPHYLOCOCCUS AUREUS  Final      Susceptibility   Methicillin resistant staphylococcus aureus - MIC*    CIPROFLOXACIN >=8 RESISTANT Resistant     ERYTHROMYCIN >=8 RESISTANT Resistant     GENTAMICIN <=0.5 SENSITIVE Sensitive     OXACILLIN >=4 RESISTANT Resistant     TETRACYCLINE <=1 SENSITIVE Sensitive     VANCOMYCIN <=0.5 SENSITIVE Sensitive     TRIMETH/SULFA <=10 SENSITIVE Sensitive     CLINDAMYCIN <=0.25 SENSITIVE Sensitive     RIFAMPIN <=0.5 SENSITIVE Sensitive     Inducible Clindamycin NEGATIVE Sensitive     * METHICILLIN RESISTANT STAPHYLOCOCCUS AUREUS  Blood Culture ID Panel  (Reflexed)     Status: Abnormal   Collection Time: 04/14/16  1:52 AM  Result Value Ref Range Status   Enterococcus species NOT DETECTED NOT DETECTED Final   Listeria monocytogenes NOT DETECTED NOT DETECTED Final   Staphylococcus species DETECTED (A) NOT DETECTED Final    Comment: CRITICAL RESULT CALLED TO, READ BACK BY AND VERIFIED WITH: V BRYK,PHARMD @0534  04/15/16 MKELLY,MLT    Staphylococcus aureus DETECTED (A) NOT DETECTED Final    Comment: CRITICAL RESULT CALLED TO, READ BACK BY AND VERIFIED WITH: V BRYK, PHARMD @0534  04/15/16 MKELLY,MLT    Methicillin resistance DETECTED (A) NOT DETECTED Final  Comment: CRITICAL RESULT CALLED TO, READ BACK BY AND VERIFIED WITH: V BRYK,PHARMD @0534  04/15/16 MKELLY,MLT    Streptococcus species NOT DETECTED NOT DETECTED Final   Streptococcus agalactiae NOT DETECTED NOT DETECTED Final   Streptococcus pneumoniae NOT DETECTED NOT DETECTED Final   Streptococcus pyogenes NOT DETECTED NOT DETECTED Final   Acinetobacter baumannii NOT DETECTED NOT DETECTED Final   Enterobacteriaceae species NOT DETECTED NOT DETECTED Final   Enterobacter cloacae complex NOT DETECTED NOT DETECTED Final   Escherichia coli NOT DETECTED NOT DETECTED Final   Klebsiella oxytoca NOT DETECTED NOT DETECTED Final   Klebsiella pneumoniae NOT DETECTED NOT DETECTED Final   Proteus species NOT DETECTED NOT DETECTED Final   Serratia marcescens NOT DETECTED NOT DETECTED Final   Haemophilus influenzae NOT DETECTED NOT DETECTED Final   Neisseria meningitidis NOT DETECTED NOT DETECTED Final   Pseudomonas aeruginosa NOT DETECTED NOT DETECTED Final   Candida albicans NOT DETECTED NOT DETECTED Final   Candida glabrata NOT DETECTED NOT DETECTED Final   Candida krusei NOT DETECTED NOT DETECTED Final   Candida parapsilosis NOT DETECTED NOT DETECTED Final   Candida tropicalis NOT DETECTED NOT DETECTED Final    Comment: Performed at [facility]  Aerobic/Anaerobic Culture  (surgical/deep wound)     Status: None (Preliminary result)   Collection Time: 04/14/16  9:00 AM  Result Value Ref Range Status   Specimen Description ABSCESS RIGHT HAND  Final   Special Requests NONE  Final   Gram Stain   Final    FEW WBC PRESENT,BOTH PMN AND MONONUCLEAR FEW GRAM POSITIVE COCCI IN PAIRS IN SINGLES FEW GRAM POSITIVE COCCI IN CLUSTERS    Culture   Final    MODERATE METHICILLIN RESISTANT STAPHYLOCOCCUS AUREUS NO ANAEROBES ISOLATED; CULTURE IN PROGRESS FOR 5 DAYS    Report Status PENDING  Incomplete   Organism ID, Bacteria METHICILLIN RESISTANT STAPHYLOCOCCUS AUREUS  Final      Susceptibility   Methicillin resistant staphylococcus aureus - MIC*    CIPROFLOXACIN >=8 RESISTANT Resistant     ERYTHROMYCIN >=8 RESISTANT Resistant     GENTAMICIN <=0.5 SENSITIVE Sensitive     OXACILLIN >=4 RESISTANT Resistant     TETRACYCLINE <=1 SENSITIVE Sensitive     VANCOMYCIN <=0.5 SENSITIVE Sensitive     TRIMETH/SULFA <=10 SENSITIVE Sensitive     CLINDAMYCIN <=0.25 SENSITIVE Sensitive     RIFAMPIN <=0.5 SENSITIVE Sensitive     Inducible Clindamycin NEGATIVE Sensitive     * MODERATE METHICILLIN RESISTANT STAPHYLOCOCCUS AUREUS  Culture, blood (routine x 2)     Status: None (Preliminary result)   Collection Time: 04/15/16 12:30 PM  Result Value Ref Range Status   Specimen Description BLOOD RIGHT ANTECUBITAL  Final   Special Requests BOTTLES DRAWN AEROBIC ONLY 5CC  Final   Culture NO GROWTH 3 DAYS  Final   Report Status PENDING  Incomplete  Culture, blood (routine x 2)     Status: None (Preliminary result)   Collection Time: 04/15/16 12:35 PM  Result Value Ref Range Status   Specimen Description BLOOD LEFT HAND  Final   Special Requests BOTTLES DRAWN AEROBIC ONLY 5CC  Final   Culture NO GROWTH 3 DAYS  Final   Report Status PENDING  Incomplete  Culture, expectorated sputum-assessment     Status: None   Collection Time: 04/16/16  6:31 AM  Result Value Ref Range Status   Specimen  Description EXPECTORATED SPUTUM  Final   Special Requests NONE  Final   Sputum evaluation   Final  THIS SPECIMEN IS ACCEPTABLE. RESPIRATORY CULTURE REPORT TO FOLLOW.   Report Status 04/16/2016 FINAL  Final  Culture, respiratory (NON-Expectorated)     Status: None   Collection Time: 04/16/16  1:39 PM  Result Value Ref Range Status   Specimen Description EXPECTORATED SPUTUM  Final   Special Requests NONE  Final   Gram Stain   Final    ABUNDANT WBC PRESENT, PREDOMINANTLY PMN FEW SQUAMOUS EPITHELIAL CELLS PRESENT FEW GRAM POSITIVE COCCI IN PAIRS IN CLUSTERS RARE GRAM POSITIVE RODS    Culture   Final    MODERATE METHICILLIN RESISTANT STAPHYLOCOCCUS AUREUS   Report Status 04/18/2016 FINAL  Final   Organism ID, Bacteria METHICILLIN RESISTANT STAPHYLOCOCCUS AUREUS  Final      Susceptibility   Methicillin resistant staphylococcus aureus - MIC*    CIPROFLOXACIN >=8 RESISTANT Resistant     ERYTHROMYCIN >=8 RESISTANT Resistant     GENTAMICIN <=0.5 SENSITIVE Sensitive     OXACILLIN >=4 RESISTANT Resistant     TETRACYCLINE <=1 SENSITIVE Sensitive     VANCOMYCIN <=0.5 SENSITIVE Sensitive     TRIMETH/SULFA <=10 SENSITIVE Sensitive     CLINDAMYCIN <=0.25 SENSITIVE Sensitive     RIFAMPIN <=0.5 SENSITIVE Sensitive     Inducible Clindamycin NEGATIVE Sensitive     * MODERATE METHICILLIN RESISTANT STAPHYLOCOCCUS AUREUS         Radiology Studies: US Abdomen Complete  Result Date: 04/17/2016 CLINICAL DATA:  Cirrhosis. EXAM: ABDOMEN ULTRASOUND COMPLETE COMPARISON:  02/24/12 FINDINGS: Gallbladder: Surgically absent Common bile duct: Diameter: 11 mm. Where visualized, no filling defect. Liver: Cirrhotic morphology. No evidence of mass. Antegrade flow in the imaged portal venous system. IVC: No abnormality visualized. Pancreas: Visualized portion unremarkable. Spleen: Enlarged with a 460 cc volume. Right Kidney: Length: 11 cm. Echogenicity within normal limits. No mass or hydronephrosis visualized.  Left Kidney: Length: 12 cm. Echogenicity within normal limits. No mass or hydronephrosis visualized. Abdominal aorta: No aneurysm visualized. Other findings: No significant ascites. IMPRESSION: 1. No acute finding. 2. Cirrhosis with splenomegaly.  No ascites. 3. Cholecystectomy with 11 mm common bile duct. Electronically Signed   By: Monte Fantasia M.D.   On: 04/17/2016 22:19        Scheduled Meds: . feeding supplement (ENSURE ENLIVE)  237 mL Oral BID PC  . Influenza vac split quadrivalent PF  0.5 mL Intramuscular Tomorrow-1000  . lidocaine  1 patch Transdermal Q24H  . lisinopril  5 mg Oral Daily  . multivitamin with minerals  1 tablet Oral Daily  . oritavancin (ORBACTIV) IVPB  1,200 mg Intravenous Once  . pantoprazole (PROTONIX) IV  40 mg Intravenous Q12H  . sodium chloride flush  10-40 mL Intracatheter Q12H  . vitamin C  1,000 mg Oral Daily   Continuous Infusions: . lactated ringers 50 mL/hr at 04/17/16 0400     LOS: 4 days    Maryfer Tauzin, MD, FACP, FHM. [facility] Pager [phone]  If 7PM-7AM, please contact night-coverage [email or web address] Password [facility] 04/18/2016, 4:51 PM

## 2016-04-18 NOTE — Interval H&P Note (Signed)
History and Physical Interval Note:  04/18/2016 10:10 AM  [name]  has presented today for surgery, with the diagnosis of upper GI bleed  The various methods of treatment have been discussed with the patient and family. After consideration of risks, benefits and other options for treatment, the patient has consented to  Procedure(s): ESOPHAGOGASTRODUODENOSCOPY (EGD) (N/A) as a surgical intervention .  The patient's history has been reviewed, patient examined, no change in status, stable for surgery.  I have reviewed the patient's chart and labs.  Questions were answered to the patient's satisfaction.     Madalynn Pickelsimer D

## 2016-04-19 LAB — AEROBIC/ANAEROBIC CULTURE (SURGICAL/DEEP WOUND)

## 2016-04-19 LAB — AEROBIC/ANAEROBIC CULTURE W GRAM STAIN (SURGICAL/DEEP WOUND)

## 2016-04-19 LAB — CULTURE, BLOOD (ROUTINE X 2): CULTURE: NO GROWTH

## 2016-04-19 MED ORDER — PANTOPRAZOLE SODIUM 40 MG PO TBEC: 40.0000 mg | DELAYED_RELEASE_TABLET | Freq: Two times a day (BID) | ORAL | 0 refills | Status: DC

## 2016-04-19 MED ORDER — ACETAMINOPHEN 325 MG PO TABS: 650.0000 mg | ORAL_TABLET | Freq: Four times a day (QID) | ORAL | Status: DC | PRN

## 2016-04-19 NOTE — Progress Notes (Signed)
      Progress Note   Subjective  Patient has had stable Hgb for the past 3 days. Tolerating diet. She is anxious about discharge planning. Had a dark stool, likely represents old blood   Objective   Vital signs in last 24 hours: Temp:  [98 F (36.7 C)-99.5 F (37.5 C)] 98 F (36.7 C) (11/25 0529) Pulse Rate:  [97-99] 97 (11/25 0529) Resp:  [18] 18 (11/25 0529) BP: (150-152)/(73-90) 150/90 (11/25 0529) SpO2:  [94 %-96 %] 96 % (11/25 0529) Last BM Date: 04/17/16 General:    white female in NAD Heart:  Regular rate and rhythm; no murmurs Lungs: Respirations even and unlabored, Abdomen:  Soft, nontender   Extremities:  Without edema. Neurologic:  Alert and oriented,  grossly normal neurologically. Psych:  Cooperative. Normal mood and affect.  Intake/Output from previous day: 11/24 0701 - 11/25 0700 In: 3109.2 [I.V.:1759.2; IV Piggyback:1350] Out: -  Intake/Output this shift: Total I/O In: 480 [P.O.:480] Out: -   Lab Results:  Recent Labs  04/17/16 1408 04/17/16 2119 04/18/16 0500  WBC 4.9 3.9* 4.4  HGB 9.5* 9.5* 9.8*  HCT 28.7* 28.7* 30.0*  PLT 121* 131* 127*   BMET  Recent Labs  04/18/16 0500  NA 137  K 4.4  CL 101  CO2 29  GLUCOSE 116*  BUN 6  CREATININE 0.84  CALCIUM 8.4*   LFT  Recent Labs  04/18/16 0500  PROT 6.0*  ALBUMIN 2.1*  AST 47*  ALT 24  ALKPHOS 139*  BILITOT 0.7   PT/INR  Recent Labs  04/16/16 1533  LABPROT 15.2  INR 1.20    Studies/Results: US Abdomen Complete  Result Date: 04/17/2016 CLINICAL DATA:  Cirrhosis. EXAM: ABDOMEN ULTRASOUND COMPLETE COMPARISON:  02/24/12 FINDINGS: Gallbladder: Surgically absent Common bile duct: Diameter: 11 mm. Where visualized, no filling defect. Liver: Cirrhotic morphology. No evidence of mass. Antegrade flow in the imaged portal venous system. IVC: No abnormality visualized. Pancreas: Visualized portion unremarkable. Spleen: Enlarged with a 460 cc volume. Right Kidney: Length: 11 cm.  Echogenicity within normal limits. No mass or hydronephrosis visualized. Left Kidney: Length: 12 cm. Echogenicity within normal limits. No mass or hydronephrosis visualized. Abdominal aorta: No aneurysm visualized. Other findings: No significant ascites. IMPRESSION: 1. No acute finding. 2. Cirrhosis with splenomegaly.  No ascites. 3. Cholecystectomy with 11 mm common bile duct. Electronically Signed   By: Monte Fantasia M.D.   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 EGD per Dr. Benson Norway yesterday showed gastric and duodenal ulcers, along with esophagitis. No high risk lesions with stigmata for bleeding.  I think she is stable for discharge today from bleeding perspective, defer to time of discharge per primary service in regards to her other issues.   She should be on protonix 40mg  BID x 1 month and then once daily thereafter. Await pathology results, treat for H pylori if positive. For her cirrhosis and hep C, and for consideration for repeat EGD to assess for mucosal healing, she can follow up with Dr. Collene Mares whom she is established with.   Please call with questions.   [place] Cellar, MD [facility] Pager [phone]

## 2016-04-19 NOTE — Care Management Note (Signed)
55 yo F c/o cellulitis of the R hand. She has a hx of IVDU. She was injecting seroquel so she could sleep. She underwent I & D R hand abscess.  CM referral to assist pt finding a PCP. Pt has Medicare and Medicaid.   Met with pt at bedside. She plans to return home with the support of her son and daughter.  Discussed the importance of having a PCP. Provided pt with a list of physician (family practice) that accepts Medicare in [place]. Encouraged pt call and make an appointment once she is D/C from the hospital.

## 2016-04-19 NOTE — Discharge Summary (Addendum)
Physician Discharge Summary  [name] [id number] DOB: 09-30-1960  PCP: [place] date: 04/13/2016 Discharge date: 04/19/2016  Recommendations for Outpatient Follow-up:  1. PCP in 3 days with repeat labs (CBC & BMP). Case management has discussed with patient and provided resources to obtain new PCP. Otherwise patient states that she usually follows up with M.D. at [facility]. 2. Recommend repeating chest x-ray in 4-6 weeks to ensure resolution of pneumonia findings. This was discussed with patient and she verbalized understanding. 3. Dr. Leanora Cover, Hand Surgeon on 04/21/16 for postop follow-up after right hand surgery. 4. Dr. Steele [place], ID in 10 days: Discussed with him and he will assist with arranging outpatient IV antibiotics. Please follow final surveillance blood culture results. 5. Dr. Juanita Craver, GI in 1 week. Follow up regarding pathology results from EGD.  Home Health: None Equipment/Devices: None    Discharge Condition: Improved and stable  CODE STATUS: Full  Diet recommendation: Heart healthy diet  Discharge Diagnoses:  Principal Problem:   Cellulitis of right hand Active Problems:   Community acquired pneumonia of right lower lobe of lung ([place])   Essential hypertension   MRSA bacteremia   Arm abscess   Bipolar affective disorder ([place])   Upper GI bleed   Cirrhosis of liver without ascites ([place])   Polysubstance abuse   Brief/Interim Summary: [name] is a 55 y.o. femalewith medical history significant of IVDU, chronic pain, bipolar disorder, ADHD, anxiety, polysubstance abuse, hepatitis C, thrombocytopenia, HTN, PUD who presented with right hand swelling, found to have an abscess. She is s/p incision and drainage by hand surgery on 04/14/16. She had MRSA right hand abscess, bacteremia and pneumonia. Infectious disease consulted and assisted with antimicrobial management and will follow outpatient. She developed acute  upper GI bleed and GI performed EGD. GI bleed has clinically resolved.   Assessment & Plan:    MRSA Right hand abscess with secondary bacteremia S/p I&D in the OR by Dr. Fredna Dow on 04/14/16. ID consultation and follow-up appreciated. She was initially treated with IV vancomycin. Surveillance blood cultures 2 are negative to date. TTE 11/23 does not show vegetations. As per ID follow-up on 11/24, patient unwilling to go to SNF for prolonged IV antibiotics and given her history of IVDA, sending her home with a PICC line is not a safe option and hence ID transitioning her to long-acting agent/Oritavancin  (received a dose on 11/24) and will arrange outpatient follow-up for second dose of antibiotic in 2 weeks. TEE not felt necessary at this time. - 1 of 2 blood cultures 04/14/16 shows MRSA. Blood cultures 2 of 04/15/16: Negative to date. Right hand abscess culture 04/14/16 confirms MRSA - I discussed with covering hand surgeon on-call Dr. Daryll Brod who advised that patient does not need any further wound care (underwent hydrotherapy in the hospital up to today) or home health services and operated hand can be reassessed on Monday 04/21/16 in office. Patient was advised to keep all her appointments and she verbalized understanding. - In the hospital, pain was managed with oral Percocet and IV morphine when necessary. Toradol had to be discontinued due to acute upper GI bleed. Due to safety concerns from her history of drug abuse, she will not be discharged on opioids. NSAIDs not an option due to recent acute GI bleed. She may use low-dose Tylenol when necessary for pain. This was discussed with her in detail.  MRSA Community acquired pneumonia/pleurisy Treated initially with IV vancomycin  and Zosyn. As of 11/24, ID has stopped these antibiotics and transitioning to Oritavancin as indicated above Urine streptococcal antigen negative. Urine Legionella antigen negative. HIV antibodies negative. - Repeat  chest x-ray 11/22: Increasing right lower lobe airspace opacity with right effusion compatible with pneumonia. - Clinically improved with improved symptoms and pain management. Recommend follow-up chest x-ray in 4-6 weeks to ensure resolution of pneumonia findings.  MRSA bacteremia 1/2 bottles positive for MRSA. This is possibly secondary to CAP vs abscess vs IV drug abuse - Antibiotic management as above per ID recommendations. -echocardiogram requested 11/22 negative for vegetations. Management as indicated above.  IV drug use/cocaine abuse/polysubstance abuse Patient has a remote history of amphetamine use via IV. She admits to using methamphetamines via IV in addition to Seroquel via IV a few days prior to admission. UDS on admission positive for amphetamines and cocaine.   Bipolar disorder/ADHD -continue prior home medications. Follows with a psychiatrist in [place]. No suicidal or homicidal ideations noted.  Hypertension -continue lisinopril. Controlled.  Acute upper GI bleed - Couple days back, patient had multiple episodes of black tarry stools. FOBT +. Discontinued NSAIDs and Lovenox DVT prophylaxis. Start IV PPI. Hemoglobin has dropped approximately 2 g.  - GI was consulted.Briefly placed on IV octreotide. She underwent EGD with detailed results as below but in summary showed gastric and duodenal ulcers, along with esophagitis but no high risk lesions with stigmata for bleeding. As per GI recommendation, stable for discharge, Protonix 40 MG twice a day 1 week and then once daily thereafter. Follow pathology results and treat for H. pylori if positive. For her cirrhosis and hepatitis C, and for consideration for repeat EGD to assess for mucosal healing, she can follow-up with her primary gastroenterologist Dr. Collene Mares. Patient counseled regarding avoiding tobacco abuse, alcohol and NSAIDs.  Acute blood loss anemia - Hemoglobin has dropped approximately 2 g secondary to upper GI  bleed but has been stable in the 9 g range for last couple of days.  Thrombocytopenia  - stable.? Related to hepatitis C cirrhosis.  Chronic hepatitis C with cirrhosis without hepatic coma - Ultrasound 2013 confirm cirrhosis. HCV viral load: 2,310,000. HIV antibody testing negative. Outpatient follow-up with ID for further management. Abdominal ultrasound 11/23 confirmed cirrhosis with splenomegaly but no ascites or other acute findings. Alpha-fetoprotein 1.4.    Consultants:   Hand surgery  Infectious disease  GI  Procedures:   I&D of right hand abscess (11/20)  PICC line (placed due to difficult stick (scratch that)   2-D echo 04/17/16: Study Conclusions  - Left ventricle: The cavity size was normal. Wall thickness was increased in a pattern of mild LVH. Systolic function was normal. The estimated ejection fraction was in the range of 55% to 60%. Wall motion was normal; there were no regional wall motion abnormalities. Doppler parameters are consistent with abnormal left ventricular relaxation (grade 1 diastolic dysfunction). - Left atrium: The atrium was mildly dilated. - Right atrium: Central venous pressure (est): 3 mm Hg. - Tricuspid valve: There was trivial regurgitation. - Pulmonary arteries: PA peak pressure: 11 mm Hg (S). - Pericardium, extracardiac: There was no pericardial effusion.  Impressions:  - Mild LVH with LVEF 55-60%. Grade 1 diastolic dysfunction. Mild left atrial enlargement. Trivial tricuspid regurgitation with normal estimated PASP. No obvious valvular vegetations.   EGD 04/18/16: Impression: - LA Grade B reflux esophagitis. - 2 cm hiatal hernia. - Non-bleeding gastric ulcers with no stigmata of  bleeding. Biopsied. - Portal hypertensive gastropathy. -  Multiple non-bleeding  duodenal ulcers with no  stigmata of bleeding. Moderate Sedation: Moderate (conscious) sedation was administered by the endoscopy nurse  and supervised by the endoscopist. The following parameters were  monitored: oxygen saturation, heart rate, blood pressure, and response  to care. Recommendation: - Return patient to hospital ward for ongoing care. - Resume previous diet. - Continue present medications. - Await pathology results. - Change PPI from IV to oral. - Stop octreotide.   Discharge Instructions  Discharge Instructions    Call MD for:  difficulty breathing, headache or visual disturbances    Complete by:  As directed    Call MD for:  extreme fatigue    Complete by:  As directed    Call MD for:  persistant dizziness or light-headedness    Complete by:  As directed    Call MD for:  persistant nausea and vomiting    Complete by:  As directed    Call MD for:  redness, tenderness, or signs of infection (pain, swelling, redness, odor or green/yellow discharge around incision site)    Complete by:  As directed    Call MD for:  severe uncontrolled pain    Complete by:  As directed    Call MD for:  temperature >100.4    Complete by:  As directed    Diet - low sodium heart healthy    Complete by:  As directed    Increase activity slowly    Complete by:  As directed        Medication List    STOP taking these medications   naproxen sodium 220 MG tablet Commonly known as:  ANAPROX     TAKE these medications   acetaminophen 325 MG tablet Commonly known as:  TYLENOL Take 2 tablets (650 mg total) by mouth every 6 (six) hours as needed for mild pain, moderate pain or fever.   ALPRAZolam 1 MG tablet Commonly known as:  XANAX Take 1 mg by mouth 2 (two) times daily as needed for anxiety.    amphetamine-dextroamphetamine 10 MG tablet Commonly known as:  ADDERALL Take 10 mg by mouth 2 (two) times daily. What changed:  Another medication with the same name was removed. Continue taking this medication, and follow the directions you see here.   lisinopril 5 MG tablet Commonly known as:  PRINIVIL,ZESTRIL Take 1 tablet (5 mg total) by mouth daily.   pantoprazole 40 MG tablet Commonly known as:  PROTONIX Take 1 tablet (40 mg total) by mouth 2 (two) times daily before a meal.   QUEtiapine 200 MG tablet Commonly known as:  SEROQUEL Take 200 mg by mouth at bedtime as needed (mood, agitation).      Follow-up Information    Tennis Must, MD Follow up on 04/21/2016.   Specialty:  Orthopedic Surgery Contact information: [place] [address] [phone]        Family Doctor of choice. Schedule an appointment as soon as possible for a visit in 3 day(s).   Why:  To be seen with repeat labs (CBC & BMP).       Alcide Evener, MD. Schedule an appointment as soon as possible for a visit in 10 day(s).   Specialty:  Infectious Diseases Why:  Follow up regarding arranging outpatient IV antibiotics in 2 weeks. Contact information: [address] [place] [address] [phone]        Juanita Craver, MD. Schedule an appointment as soon as possible for a visit in 1  week(s).   Specialty:  Gastroenterology Why:  Regarding follow up of biopsy results and further management. Contact information: [address] [place] [address] [phone]          Allergies  Allergen Reactions  . Albuterol Other (See Comments)    PT states she had seizures from an albuterol treatment and was hospitalized for it  . Aspirin Hives    Vomits blood  . Adhesive [Tape] Rash      Procedures/Studies: Dg Chest 2 View  Result Date: 04/16/2016 CLINICAL DATA:  Right side chest pain.  Pneumonia. EXAM: CHEST  2 VIEW COMPARISON:  04/14/2016  FINDINGS: Increasing opacity at the right lung base concerning for pneumonia. Small right effusion. No confluent opacity on the left. Heart is normal size. Small hiatal hernia. No acute bony abnormality. IMPRESSION: Increasing right lower lobe airspace opacity with right effusion compatible with pneumonia. Small hiatal hernia. Electronically Signed   By: Rolm Baptise M.D.   On: 04/16/2016 12:09   Dg Chest 2 View  Result Date: 04/14/2016 CLINICAL DATA:  Left arm swelling since yesterday. The irritated sleeve bites on the hands. Skin abscess. Swelling in the hand and arm. Redness. Right lower rib cage pain after coughing yesterday. EXAM: CHEST  2 VIEW COMPARISON:  10/06/2015 FINDINGS: There is focal area of increased density in the right lung base laterally which may indicate focal pneumonia. Left lung is clear. Normal heart size and pulmonary vascularity. No blunting of costophrenic angles. No pneumothorax. Mediastinal contours appear intact. Calcified aorta. Esophageal hiatal hernia behind the heart. Postoperative changes in the cervical spine. IMPRESSION: Small focal area of infiltration in the right lung base may indicate focal pneumonia. Electronically Signed   By: Lucienne Capers M.D.   On: 04/14/2016 00:59   US Abdomen Complete  Result Date: 04/17/2016 CLINICAL DATA:  Cirrhosis. EXAM: ABDOMEN ULTRASOUND COMPLETE COMPARISON:  02/24/12 FINDINGS: Gallbladder: Surgically absent Common bile duct: Diameter: 11 mm. Where visualized, no filling defect. Liver: Cirrhotic morphology. No evidence of mass. Antegrade flow in the imaged portal venous system. IVC: No abnormality visualized. Pancreas: Visualized portion unremarkable. Spleen: Enlarged with a 460 cc volume. Right Kidney: Length: 11 cm. Echogenicity within normal limits. No mass or hydronephrosis visualized. Left Kidney: Length: 12 cm. Echogenicity within normal limits. No mass or hydronephrosis visualized. Abdominal aorta: No aneurysm visualized. Other  findings: No significant ascites. IMPRESSION: 1. No acute finding. 2. Cirrhosis with splenomegaly.  No ascites. 3. Cholecystectomy with 11 mm common bile duct. Electronically Signed   By: Monte Fantasia M.D.   On: 04/17/2016 22:19   Dg Hand Complete Right  Result Date: 04/14/2016 CLINICAL DATA:  Left arm swelling.  Reported flea bites. EXAM: RIGHT HAND - COMPLETE 3+ VIEW COMPARISON:  None. FINDINGS: There is no evidence of fracture or dislocation. There is no evidence of arthropathy or other focal bone abnormality. Soft tissues are unremarkable. IMPRESSION: No focal osseous abnormality. Electronically Signed   By: Ulyses Jarred M.D.   On: 04/14/2016 00:59      Subjective: "I'm okay". Complained of mild intermittent right hand pain. Right-sided pleuritic chest pain has improved. Denies dyspnea, cough. Had a dark stool this morning which may represent old blood.  Discharge Exam:  Vitals:   04/18/16 1050 04/18/16 1331 04/18/16 1946 04/19/16 0529  BP: (!) 196/83 (!) 151/73 (!) 152/81 (!) 150/90  Pulse: 86 98 99 97  Resp: 19  18 18   Temp:  99.4 F (37.4 C) 99.5 F (37.5 C) 98  F (36.7 C)  TempSrc:  Axillary Oral Oral  SpO2: 97% 94% 96% 96%  Weight:      Height:        General exam: Pleasant middle-aged female lying comfortably propped up in bed and in no obvious distress.  Respiratory system: Slightly diminished breath sounds in the right base with occasional crackles but improved compared to a couple days ago. No pleural rub. Rest of lung fields clear to auscultation. No increased work of breathing. Cardiovascular system: S1 & S2 heard, RRR. No murmurs, rubs, gallops or clicks. Gastrointestinal system: Abdomen is nondistended, soft and nontender. Normal bowel sounds heard. Central nervous system: Alert and oriented. No focal neurological deficits. Extremities: No edema. No calf tenderness. Right hand dressing clean and dry. Able to wiggle fingers without difficulty and color and  capillary refill normal. Skin: No cyanosis. No rashes Psychiatry: Judgement and insight appear normal. Mood & affect appropriate.     The results of significant diagnostics from this hospitalization (including imaging, microbiology, ancillary and laboratory) are listed below for reference.     Microbiology: Recent Results (from the past 240 hour(s))  Blood culture (routine x 2)     Status: None (Preliminary result)   Collection Time: 04/14/16  1:52 AM  Result Value Ref Range Status   Specimen Description BLOOD BLOOD LEFT HAND  Final   Special Requests BOTTLES DRAWN AEROBIC ONLY 4ML  Final   Culture   Final    NO GROWTH 4 DAYS Performed at [facility]    Report Status PENDING  Incomplete  Blood culture (routine x 2)     Status: Abnormal   Collection Time: 04/14/16  1:52 AM  Result Value Ref Range Status   Specimen Description BLOOD BLOOD LEFT FOREARM  Final   Special Requests IN PEDIATRIC BOTTLE 0.5 ML  Final   Culture  Setup Time   Final    GRAM POSITIVE COCCI IN CLUSTERS IN PEDIATRIC BOTTLE V BRYK,PHARMD @0534  04/15/16 MKELLY,MLT Performed at [facility]    Culture METHICILLIN RESISTANT STAPHYLOCOCCUS AUREUS (A)  Final   Report Status 04/17/2016 FINAL  Final   Organism ID, Bacteria METHICILLIN RESISTANT STAPHYLOCOCCUS AUREUS  Final      Susceptibility   Methicillin resistant staphylococcus aureus - MIC*    CIPROFLOXACIN >=8 RESISTANT Resistant     ERYTHROMYCIN >=8 RESISTANT Resistant     GENTAMICIN <=0.5 SENSITIVE Sensitive     OXACILLIN >=4 RESISTANT Resistant     TETRACYCLINE <=1 SENSITIVE Sensitive     VANCOMYCIN <=0.5 SENSITIVE Sensitive     TRIMETH/SULFA <=10 SENSITIVE Sensitive     CLINDAMYCIN <=0.25 SENSITIVE Sensitive     RIFAMPIN <=0.5 SENSITIVE Sensitive     Inducible Clindamycin NEGATIVE Sensitive     * METHICILLIN RESISTANT STAPHYLOCOCCUS AUREUS  Blood Culture ID Panel (Reflexed)     Status: Abnormal   Collection Time: 04/14/16  1:52 AM   Result Value Ref Range Status   Enterococcus species NOT DETECTED NOT DETECTED Final   Listeria monocytogenes NOT DETECTED NOT DETECTED Final   Staphylococcus species DETECTED (A) NOT DETECTED Final    Comment: CRITICAL RESULT CALLED TO, READ BACK BY AND VERIFIED WITH: V BRYK,PHARMD @0534  04/15/16 MKELLY,MLT    Staphylococcus aureus DETECTED (A) NOT DETECTED Final    Comment: CRITICAL RESULT CALLED TO, READ BACK BY AND VERIFIED WITH: V BRYK, PHARMD @0534  04/15/16 MKELLY,MLT    Methicillin resistance DETECTED (A) NOT DETECTED Final    Comment: CRITICAL RESULT CALLED TO, READ BACK  BY AND VERIFIED WITH: V BRYK,PHARMD @0534  04/15/16 MKELLY,MLT    Streptococcus species NOT DETECTED NOT DETECTED Final   Streptococcus agalactiae NOT DETECTED NOT DETECTED Final   Streptococcus pneumoniae NOT DETECTED NOT DETECTED Final   Streptococcus pyogenes NOT DETECTED NOT DETECTED Final   Acinetobacter baumannii NOT DETECTED NOT DETECTED Final   Enterobacteriaceae species NOT DETECTED NOT DETECTED Final   Enterobacter cloacae complex NOT DETECTED NOT DETECTED Final   Escherichia coli NOT DETECTED NOT DETECTED Final   Klebsiella oxytoca NOT DETECTED NOT DETECTED Final   Klebsiella pneumoniae NOT DETECTED NOT DETECTED Final   Proteus species NOT DETECTED NOT DETECTED Final   Serratia marcescens NOT DETECTED NOT DETECTED Final   Haemophilus influenzae NOT DETECTED NOT DETECTED Final   Neisseria meningitidis NOT DETECTED NOT DETECTED Final   Pseudomonas aeruginosa NOT DETECTED NOT DETECTED Final   Candida albicans NOT DETECTED NOT DETECTED Final   Candida glabrata NOT DETECTED NOT DETECTED Final   Candida krusei NOT DETECTED NOT DETECTED Final   Candida parapsilosis NOT DETECTED NOT DETECTED Final   Candida tropicalis NOT DETECTED NOT DETECTED Final    Comment: Performed at [facility]  Aerobic/Anaerobic Culture (surgical/deep wound)     Status: None   Collection Time: 04/14/16  9:00 AM   Result Value Ref Range Status   Specimen Description ABSCESS RIGHT HAND  Final   Special Requests NONE  Final   Gram Stain   Final    FEW WBC PRESENT,BOTH PMN AND MONONUCLEAR FEW GRAM POSITIVE COCCI IN PAIRS IN SINGLES FEW GRAM POSITIVE COCCI IN CLUSTERS    Culture   Final    MODERATE METHICILLIN RESISTANT STAPHYLOCOCCUS AUREUS NO ANAEROBES ISOLATED    Report Status 04/19/2016 FINAL  Final   Organism ID, Bacteria METHICILLIN RESISTANT STAPHYLOCOCCUS AUREUS  Final      Susceptibility   Methicillin resistant staphylococcus aureus - MIC*    CIPROFLOXACIN >=8 RESISTANT Resistant     ERYTHROMYCIN >=8 RESISTANT Resistant     GENTAMICIN <=0.5 SENSITIVE Sensitive     OXACILLIN >=4 RESISTANT Resistant     TETRACYCLINE <=1 SENSITIVE Sensitive     VANCOMYCIN <=0.5 SENSITIVE Sensitive     TRIMETH/SULFA <=10 SENSITIVE Sensitive     CLINDAMYCIN <=0.25 SENSITIVE Sensitive     RIFAMPIN <=0.5 SENSITIVE Sensitive     Inducible Clindamycin NEGATIVE Sensitive     * MODERATE METHICILLIN RESISTANT STAPHYLOCOCCUS AUREUS  Culture, blood (routine x 2)     Status: None (Preliminary result)   Collection Time: 04/15/16 12:30 PM  Result Value Ref Range Status   Specimen Description BLOOD RIGHT ANTECUBITAL  Final   Special Requests BOTTLES DRAWN AEROBIC ONLY 5CC  Final   Culture NO GROWTH 3 DAYS  Final   Report Status PENDING  Incomplete  Culture, blood (routine x 2)     Status: None (Preliminary result)   Collection Time: 04/15/16 12:35 PM  Result Value Ref Range Status   Specimen Description BLOOD LEFT HAND  Final   Special Requests BOTTLES DRAWN AEROBIC ONLY 5CC  Final   Culture NO GROWTH 3 DAYS  Final   Report Status PENDING  Incomplete  Culture, expectorated sputum-assessment     Status: None   Collection Time: 04/16/16  6:31 AM  Result Value Ref Range Status   Specimen Description EXPECTORATED SPUTUM  Final   Special Requests NONE  Final   Sputum evaluation   Final    THIS SPECIMEN IS  ACCEPTABLE. RESPIRATORY CULTURE REPORT TO FOLLOW.  Report Status 04/16/2016 FINAL  Final  Culture, respiratory (NON-Expectorated)     Status: None   Collection Time: 04/16/16  1:39 PM  Result Value Ref Range Status   Specimen Description EXPECTORATED SPUTUM  Final   Special Requests NONE  Final   Gram Stain   Final    ABUNDANT WBC PRESENT, PREDOMINANTLY PMN FEW SQUAMOUS EPITHELIAL CELLS PRESENT FEW GRAM POSITIVE COCCI IN PAIRS IN CLUSTERS RARE GRAM POSITIVE RODS    Culture   Final    MODERATE METHICILLIN RESISTANT STAPHYLOCOCCUS AUREUS   Report Status 04/18/2016 FINAL  Final   Organism ID, Bacteria METHICILLIN RESISTANT STAPHYLOCOCCUS AUREUS  Final      Susceptibility   Methicillin resistant staphylococcus aureus - MIC*    CIPROFLOXACIN >=8 RESISTANT Resistant     ERYTHROMYCIN >=8 RESISTANT Resistant     GENTAMICIN <=0.5 SENSITIVE Sensitive     OXACILLIN >=4 RESISTANT Resistant     TETRACYCLINE <=1 SENSITIVE Sensitive     VANCOMYCIN <=0.5 SENSITIVE Sensitive     TRIMETH/SULFA <=10 SENSITIVE Sensitive     CLINDAMYCIN <=0.25 SENSITIVE Sensitive     RIFAMPIN <=0.5 SENSITIVE Sensitive     Inducible Clindamycin NEGATIVE Sensitive     * MODERATE METHICILLIN RESISTANT STAPHYLOCOCCUS AUREUS     Labs: BNP (last 3 results) No results for input(s): BNP in the last 8760 hours. Basic Metabolic Panel:  Recent Labs Lab 04/14/16 0305 04/14/16 1047 04/16/16 0644 04/18/16 0500  NA 134*  --  133* 137  K 3.1*  --  4.2 4.4  CL 97*  --  101 101  CO2  --   --  25 29  GLUCOSE 141*  --  116* 116*  BUN 9  --  16 6  CREATININE 0.70 0.96 0.81 0.84  CALCIUM  --   --  8.6* 8.4*   Liver Function Tests:  Recent Labs Lab 04/18/16 0500  AST 47*  ALT 24  ALKPHOS 139*  BILITOT 0.7  PROT 6.0*  ALBUMIN 2.1*   No results for input(s): LIPASE, AMYLASE in the last 168 hours. No results for input(s): AMMONIA in the last 168 hours. CBC:  Recent Labs Lab 04/14/16 0152  04/16/16 2253  04/17/16 0552 04/17/16 1408 04/17/16 2119 04/18/16 0500  WBC 11.5*  < > 5.6 4.8 4.9 3.9* 4.4  NEUTROABS 9.1*  --   --   --   --   --   --   HGB 13.0  < > 10.0* 9.4* 9.5* 9.5* 9.8*  HCT 38.4  < > 30.5* 28.9* 28.7* 28.7* 30.0*  MCV 91.2  < > 91.9 91.7 92.3 92.9 94.0  PLT 114*  < > 127* 114* 121* 131* 127*  < > = values in this interval not displayed.    Time coordinating discharge: Over 30 minutes  SIGNED:  Vernell Leep, MD, FACP, [place]. [facility] Pager [phone]  If 7PM-7AM, please contact night-coverage [email or web address] Password [email or web address] 04/19/2016, 2:25 PM   Addendum  Surveillance blood cultures 2 from 04/15/16: Negative.  Vernell Leep, MD, FACP, FHM. [facility] Pager [phone]  If 7PM-7AM, please contact night-coverage [email or web address] Password [email or web address] 04/30/2016, 11:43 AM

## 2016-04-19 NOTE — Progress Notes (Signed)
Physical Therapy Wound Treatment Patient Details  Name: [name] MRN: [id number] Date of Birth: 1960/09/02  Today's Date: 04/19/2016 Time: 5621-3086 Time Calculation (min): 30 min  Subjective  Patient and Family Stated Goals: get hand better Date of Onset:  (few days ago) Prior Treatments: Surgical I&D  Pain Score:    Wound Assessment  Wound / Incision (Open or Dehisced) 04/16/16 Incision - Open Hand Right;Posterior;Lateral thenar eminence (Active)  Dressing Type Gauze (Comment);Compression wrap 04/19/2016 11:27 AM  Dressing Changed Changed 04/18/2016  3:58 PM  Dressing Status Clean;Dry;Intact 04/19/2016 11:27 AM  Dressing Change Frequency Daily 04/19/2016 11:27 AM  Site / Wound Assessment Red;Yellow 04/19/2016 11:27 AM  % Wound base Red or Granulating 95% 04/19/2016 11:27 AM  % Wound base Yellow/Fibrinous Exudate 5% 04/19/2016 11:27 AM  % Wound base Black/Eschar 0% 04/19/2016 11:27 AM  % Wound base Other/Granulation Tissue (Comment) 0% 04/19/2016 11:27 AM  Peri-wound Assessment Erythema (blanchable);Edema 04/19/2016 11:27 AM  Wound Length (cm) 4 cm 04/16/2016  9:00 AM  Wound Width (cm) 0.5 cm 04/16/2016  9:00 AM  Wound Depth (cm) 1.5 cm 04/16/2016  9:00 AM  Margins Unattached edges (unapproximated) 04/19/2016 11:27 AM  Closure None 04/19/2016 11:27 AM  Drainage Amount Minimal 04/19/2016 11:27 AM  Drainage Description Serosanguineous 04/19/2016 11:27 AM  Treatment Cleansed;Debridement (Selective);Hydrotherapy (Pulse lavage) 04/19/2016 11:27 AM     Wound / Incision (Open or Dehisced) 04/16/16 Incision - Open Hand Anterior;Lateral;Right thenar eminence (Active)  Dressing Type Compression wrap;Gauze (Comment) 04/19/2016 11:27 AM  Dressing Changed Changed 04/19/2016 11:27 AM  Dressing Status Clean;Dry;Intact 04/19/2016 11:27 AM  Dressing Change Frequency Daily 04/19/2016 11:27 AM  Site / Wound Assessment Red;Yellow 04/19/2016 11:27 AM  % Wound base Red or Granulating 70%  04/19/2016 11:27 AM  % Wound base Yellow/Fibrinous Exudate 30% 04/19/2016 11:27 AM  % Wound base Black/Eschar 0% 04/19/2016 11:27 AM  % Wound base Other/Granulation Tissue (Comment) 0% 04/19/2016 11:27 AM  Peri-wound Assessment Erythema (blanchable);Edema 04/19/2016 11:27 AM  Wound Length (cm) 2.5 cm 04/16/2016  9:00 AM  Wound Width (cm) 1.5 cm 04/16/2016  9:00 AM  Wound Depth (cm) 0.2 cm 04/16/2016  9:00 AM  Undermining (cm) .3 at 8-9 o'clock; .2 at 3-4 oclock 04/16/2016  9:00 AM  Margins Unattached edges (unapproximated) 04/19/2016 11:27 AM  Closure None 04/19/2016 11:27 AM  Drainage Amount Minimal 04/19/2016 11:27 AM  Drainage Description Serosanguineous 04/19/2016 11:27 AM  Treatment Cleansed;Debridement (Selective);Hydrotherapy (Pulse lavage) 04/19/2016 11:27 AM   Hydrotherapy Pulsed lavage therapy - wound location: rt thenar eminence Pulsed Lavage with Suction (psi): 8 psi Pulsed Lavage with Suction - Normal Saline Used: 1000 mL Pulsed Lavage Tip: Tip with splash shield   Wound Assessment and Plan  Wound Therapy - Assess/Plan/Recommendations Wound Therapy - Clinical Statement: Pt with thin layer of yellow slough over anterior wound. Continue hydrotherapy for removal of necrotic tissue and to decrease bioburden. Pt encouraged to continue to perform rt hand ROM exercises. Wound Therapy - Functional Problem List: Decr use of rt hand Factors Delaying/Impairing Wound Healing: Infection - systemic/local;Substance abuse;Multiple medical problems Hydrotherapy Plan: Debridement;Dressing change;Patient/family education;Pulsatile lavage with suction Wound Therapy - Frequency: 6X / week Wound Therapy - Follow Up Recommendations: Other (comment);Home health RN (hand surgeon office vs [place]) Wound Plan: See above  Wound Therapy Goals- Improve the function of patient's integumentary system by progressing the wound(s) through the phases of wound healing (inflammation - proliferation - remodeling)  by: Decrease Necrotic Tissue to: 0 Decrease Necrotic Tissue - Progress: Progressing toward  goal Increase Granulation Tissue to: 100 Increase Granulation Tissue - Progress: Progressing toward goal Time For Goal Achievement: 7 days Wound Therapy - Potential for Goals: Good  Goals will be updated until maximal potential achieved or discharge criteria met.  Discharge criteria: when goals achieved, discharge from hospital, MD decision/surgical intervention, no progress towards goals, refusal/missing three consecutive treatments without notification or medical reason.  GP     Tessie Fass  04/19/2016, 11:30 AM 04/19/2016  Donnella Sham, [place] [phone]  (pager)

## 2016-04-19 NOTE — Plan of Care (Signed)
Problem: Safety: Goal: Ability to remain free from injury will improve Outcome: Progressing Fall precautions and preventions maintained  Problem: Activity: Goal: Risk for activity intolerance will decrease Outcome: Progressing Tolerates activities well  Problem: Bowel/Gastric: Goal: Will not experience complications related to bowel motility Outcome: Progressing No bowel or gastric issues reported this shift

## 2016-04-20 LAB — CULTURE, BLOOD (ROUTINE X 2)
CULTURE: NO GROWTH
Culture: NO GROWTH

## 2016-04-21 ENCOUNTER — Encounter (HOSPITAL_COMMUNITY): Payer: Self-pay | Admitting: Gastroenterology

## 2016-04-22 ENCOUNTER — Telehealth: Payer: Self-pay | Admitting: *Deleted

## 2016-04-22 NOTE — Telephone Encounter (Signed)
[name] called patient to set up hospital follow up. Patient asked to speak with a nurse.  RN called patient. She asked for pain medication, stating her hand was worse, the dressings had more discharge and "look gross."  Patient did not know she was supposed to follow up with hand surgeon yesterday - RN gave her the phone number and advised her to call today. RN advised patient that [facility] does not prescribe pain medication, that she should contact the surgeon if her wound is worse, that she could alternatively go to the ED.  Patient stated she did not want to sit for 6 hours in the ED, that it makes her mental health condition worse, and she is also looking for a new PCP and mental health provider. RN advised that she could ask for PCP services at [facility], that she could follow discharge instructions for setting up PCP care, that [facility] behavioral health is available on walk-in basis. Patient then admitted she is actually in care for behavioral health.  Patient is scheduled for follow up with Dr Megan Salon 12/21.  She received 1st dose oritavancin on 11/24.  When should she receive her 2nd dose? Landis Gandy, RN

## 2016-04-22 NOTE — Telephone Encounter (Signed)
He will need to be set up with another dose of a long-acting agent versus being treated with oral antibiotics. The second long-acting agent would likely be dalbavancin  I believe should be roughly 2 weeks from her dose of oritavancin in infusion center

## 2016-04-22 NOTE — Telephone Encounter (Signed)
Thanks so much. 

## 2016-04-22 NOTE — Telephone Encounter (Signed)
Written orders obtained from Dr Tommy Medal for infusion of dalbavancin 1500 mg once per peripheral IV to be administered at [facility] on or around 05/06/16.  Orders faxed to [phone].  Patient is scheduled on 12/12 - [facility] will contact patient to confirm, for further instructions.

## 2016-05-06 ENCOUNTER — Inpatient Hospital Stay (HOSPITAL_COMMUNITY)
Admission: RE | Admit: 2016-05-06 | Discharge: 2016-05-06 | Disposition: A | Discharge status: Home / Self Care | Payer: Medicare Other | Source: Ambulatory Visit

## 2016-05-07 NOTE — Progress Notes (Signed)
Left message at [facility] with nurse Kennyth Lose, advising that patient did not show up for her IV infusion on yesterday.  Kennyth Lose will let Dr. Tommy Medal know.

## 2016-05-08 ENCOUNTER — Encounter (HOSPITAL_COMMUNITY): Payer: Self-pay | Admitting: Emergency Medicine

## 2016-05-08 DIAGNOSIS — Z96643 Presence of artificial hip joint, bilateral: Secondary | ICD-10-CM | POA: Diagnosis not present

## 2016-05-08 DIAGNOSIS — R0600 Dyspnea, unspecified: Secondary | ICD-10-CM | POA: Insufficient documentation

## 2016-05-08 DIAGNOSIS — M791 Myalgia: Secondary | ICD-10-CM | POA: Insufficient documentation

## 2016-05-08 DIAGNOSIS — F17210 Nicotine dependence, cigarettes, uncomplicated: Secondary | ICD-10-CM | POA: Insufficient documentation

## 2016-05-08 DIAGNOSIS — F909 Attention-deficit hyperactivity disorder, unspecified type: Secondary | ICD-10-CM | POA: Diagnosis not present

## 2016-05-08 DIAGNOSIS — R0602 Shortness of breath: Secondary | ICD-10-CM | POA: Diagnosis present

## 2016-05-08 DIAGNOSIS — I10 Essential (primary) hypertension: Secondary | ICD-10-CM | POA: Diagnosis not present

## 2016-05-08 NOTE — ED Triage Notes (Signed)
Pt comes in tonight with c/o shortness of breath  Pt states she feels like she cannot get enough air  Pt states she was recently in [facility] and had to have surgery on her right wrist/hand for cellulitis  Pt states she feels light headed and cold  Pt is c/o pain in her right lower lung area  Pt has cough noted

## 2016-05-09 ENCOUNTER — Emergency Department (HOSPITAL_COMMUNITY): Payer: Medicare Other

## 2016-05-09 ENCOUNTER — Emergency Department (HOSPITAL_COMMUNITY)
Admission: EM | Admit: 2016-05-09 | Discharge: 2016-05-09 | Disposition: A | Discharge status: Home / Self Care | Payer: Medicare Other | Attending: Emergency Medicine | Admitting: Emergency Medicine

## 2016-05-09 ENCOUNTER — Encounter (HOSPITAL_COMMUNITY): Payer: Self-pay

## 2016-05-09 DIAGNOSIS — R0600 Dyspnea, unspecified: Secondary | ICD-10-CM | POA: Diagnosis not present

## 2016-05-09 DIAGNOSIS — M791 Myalgia, unspecified site: Secondary | ICD-10-CM

## 2016-05-09 LAB — URINALYSIS, ROUTINE W REFLEX MICROSCOPIC
Bilirubin Urine: NEGATIVE
Glucose, UA: NEGATIVE mg/dL
KETONES UR: NEGATIVE mg/dL
NITRITE: NEGATIVE
PH: 5 (ref 5.0–8.0)
PROTEIN: NEGATIVE mg/dL
Specific Gravity, Urine: 1.009 (ref 1.005–1.030)

## 2016-05-09 LAB — I-STAT CG4 LACTIC ACID, ED
LACTIC ACID, VENOUS: 2.77 mmol/L — AB (ref 0.5–1.9)
LACTIC ACID, VENOUS: 1.28 mmol/L (ref 0.5–1.9)

## 2016-05-09 LAB — I-STAT CHEM 8, ED
BUN: 10 mg/dL (ref 6–20)
CALCIUM ION: 1.21 mmol/L (ref 1.15–1.40)
CHLORIDE: 105 mmol/L (ref 101–111)
Creatinine, Ser: 1.2 mg/dL — ABNORMAL HIGH (ref 0.44–1.00)
Glucose, Bld: 97 mg/dL (ref 65–99)
HCT: 35 % — ABNORMAL LOW (ref 36.0–46.0)
HEMOGLOBIN: 11.9 g/dL — AB (ref 12.0–15.0)
POTASSIUM: 3.7 mmol/L (ref 3.5–5.1)
SODIUM: 140 mmol/L (ref 135–145)
TCO2: 24 mmol/L (ref 0–100)

## 2016-05-09 LAB — CBC WITH DIFFERENTIAL/PLATELET
BASOS ABS: 0 10*3/uL (ref 0.0–0.1)
Basophils Relative: 0 %
Eosinophils Absolute: 0 10*3/uL (ref 0.0–0.7)
Eosinophils Relative: 0 %
HCT: 33 % — ABNORMAL LOW (ref 36.0–46.0)
Hemoglobin: 11.2 g/dL — ABNORMAL LOW (ref 12.0–15.0)
LYMPHS ABS: 0.3 10*3/uL — AB (ref 0.7–4.0)
Lymphocytes Relative: 5 %
MCH: 29.9 pg (ref 26.0–34.0)
MCHC: 33.9 g/dL (ref 30.0–36.0)
MCV: 88.2 fL (ref 78.0–100.0)
MONO ABS: 0.1 10*3/uL (ref 0.1–1.0)
Monocytes Relative: 1 %
NEUTROS ABS: 6.5 10*3/uL (ref 1.7–7.7)
Neutrophils Relative %: 94 %
PLATELETS: 105 10*3/uL — AB (ref 150–400)
RBC: 3.74 MIL/uL — AB (ref 3.87–5.11)
RDW: 13.5 % (ref 11.5–15.5)
WBC: 6.9 10*3/uL (ref 4.0–10.5)

## 2016-05-09 LAB — D-DIMER, QUANTITATIVE (NOT AT ARMC): D DIMER QUANT: 3.64 ug{FEU}/mL — AB (ref 0.00–0.50)

## 2016-05-09 MED ORDER — IOPAMIDOL (ISOVUE-370) INJECTION 76%
100.0000 mL | Freq: Once | INTRAVENOUS | Status: AC | PRN
Start: 2016-05-09 — End: 2016-05-09
  Administered 2016-05-09: 100 mL via INTRAVENOUS

## 2016-05-09 MED ORDER — IOPAMIDOL (ISOVUE-370) INJECTION 76%
INTRAVENOUS | Status: AC
  Filled 2016-05-09: qty 100

## 2016-05-09 MED ORDER — TRAMADOL HCL 50 MG PO TABS: 50.0000 mg | ORAL_TABLET | Freq: Four times a day (QID) | ORAL | 0 refills | Status: DC | PRN

## 2016-05-09 MED ORDER — SODIUM CHLORIDE 0.9 % IJ SOLN
INTRAMUSCULAR | Status: AC
  Filled 2016-05-09: qty 50

## 2016-05-09 MED ORDER — TRAMADOL HCL 50 MG PO TABS
50.0000 mg | ORAL_TABLET | Freq: Once | ORAL | Status: AC
  Administered 2016-05-09: 50 mg via ORAL
  Filled 2016-05-09: qty 1

## 2016-05-09 MED ORDER — SODIUM CHLORIDE 0.9 % IV BOLUS (SEPSIS)
1200.0000 mL | Freq: Once | INTRAVENOUS | Status: AC
  Administered 2016-05-09: 1200 mL via INTRAVENOUS

## 2016-05-09 MED ORDER — SODIUM CHLORIDE 0.9 % IV BOLUS (SEPSIS)
1000.0000 mL | Freq: Once | INTRAVENOUS | Status: AC
  Administered 2016-05-09: 1000 mL via INTRAVENOUS

## 2016-05-09 MED ORDER — ACETAMINOPHEN 325 MG PO TABS
650.0000 mg | ORAL_TABLET | Freq: Once | ORAL | Status: AC
  Administered 2016-05-09: 650 mg via ORAL
  Filled 2016-05-09: qty 2

## 2016-05-09 MED ORDER — FENTANYL CITRATE (PF) 100 MCG/2ML IJ SOLN
50.0000 ug | Freq: Once | INTRAMUSCULAR | Status: AC
  Administered 2016-05-09: 50 ug via INTRAVENOUS
  Filled 2016-05-09: qty 2

## 2016-05-09 NOTE — ED Notes (Signed)
2 unsuccessful attempts at IV start 1st Left forearm site and drsg WNL post attempt and then right forearm 2nd site and drsg WNL.

## 2016-05-09 NOTE — ED Notes (Signed)
Pt has a lactic acid of 2.77 EDP Eulis Foster and Rn Merle made aware.

## 2016-05-09 NOTE — Discharge Instructions (Signed)
You need to call the doctors that you refer to when he was discharged from the hospital, stating that they wanted to see you at the 2 week point in your recovery and get a specific date and time for follow-up. Tonight your evaluated for your shortness of breath, your lung.  Chest CT shows no pathology to explain your subjective shortness of breath, but you do not have pneumonia.

## 2016-05-09 NOTE — ED Notes (Signed)
Pt declined to have rectal temperature taken.

## 2016-05-09 NOTE — ED Notes (Signed)
Nurse is explaining the need for blood cultures to pt.  Will get labs when we get consent.

## 2016-05-09 NOTE — ED Provider Notes (Signed)
[facility] DEPT Provider Note   CSN: [id number] Arrival date & time: 05/08/16  2345 By signing my name below, I, [name], attest that this documentation has been prepared under the direction and in the presence of [name], [place]. Electronically Signed: Georgette [name], ED Scribe. 05/09/16. 12:58 AM.  History   Chief Complaint Chief Complaint  Patient presents with  . Shortness of Breath   HPI Comments: [name] is a 55 y.o. female with h/o HTN who presents to the Emergency Department complaining of shortness of breath onset 3 weeks ago, worsening tonight. Pt also has subjective fever, generalized arthralgias/myalgias, chills, and nonproductive cough. Pt was recently admitted to [facility] and had surgery to her right wrist and hand for cellulitis. During this time, pt was also being treated in hospital for pneumonia and is currently given long acting antibiotics every two weeks. Pt missed her recent appointment; she claims she did not know about it. Pt denies dysuria, hematuria, ora ny other associated symptoms.   The history is provided by the patient. No language interpreter was used.    Past Medical History:  Diagnosis Date  . Anxiety   . Bipolar affective disorder ([place])    ADHD AND SCHZIOPHRENIC--PT GOES TO [place]. BLANKMAN  . Blood transfusion without reported diagnosis   . Cervical radiculopathy   . Chronic pain syndrome   . Complication of anesthesia    STATES SHE WOKE UP TWICE DURING SURGERIES  . DJD (degenerative joint disease)   . GERD (gastroesophageal reflux disease)   . H/O hiatal hernia   . Hepatitis C infection    STATES COMPLETED TREATMENT AND NO LONGER HAS INFECTION  . Hypertension   . Leukocytopenia   . Osteoarthritis   . Osteoporosis   . Peptic ulcer disease   . Pneumonia 2013  . Polysubstance (excluding opioids) dependence ([place])   . Seizures ([facility])    SEIZURE AFTER BREATHING TX (ALBUTEROL) IN ER; ALSO PAST HX SEIZURES DURING DRUG  DETOX  . Shortness of breath    WITH EXERTION; SMOKER  . Thrombocytopenia [facility])     Patient Active Problem List   Diagnosis Date Noted  . Polysubstance abuse   . Upper GI bleed   . Cirrhosis of liver without ascites ([place])   . MRSA bacteremia 04/15/2016  . Arm abscess 04/15/2016  . Bipolar affective disorder ([place]) 04/15/2016  . Cellulitis of right hand 04/14/2016  . Cellulitis of right forearm 10/05/2014  . ARF (acute renal failure) ([place]) 10/05/2014  . History of bipolar disorder 10/05/2014  . History of cirrhosis 10/05/2014  . Essential hypertension 10/05/2014  . S/P left THA, AA 08/25/2012  . Expected blood loss anemia 08/25/2012  . Obesity (BMI 30-39.9) 08/25/2012  . Pancytopenia 09/08/2011  . Hyponatremia 09/04/2011  . Hypokalemia 09/04/2011  . Community acquired pneumonia of right lower lobe of lung ([place]) 09/04/2011  . Anemia 09/04/2011  . Fatigue 09/03/2011  . Peptic ulcer disease   . Thrombocytopenia ([place])   . Leukocytopenia   . Hepatitis C infection     Past Surgical History:  Procedure Laterality Date  . ABDOMINAL HYSTERECTOMY    . APPENDECTOMY    . CHOLECYSTECTOMY    . ESOPHAGOGASTRODUODENOSCOPY N/A 04/18/2016   Procedure: ESOPHAGOGASTRODUODENOSCOPY (EGD);  Surgeon: Carol Ada, MD;  Location: [facility] ENDOSCOPY;  Service: Endoscopy;  Laterality: N/A;  . Hyperectomy     due to obstructive ovarian cytst  . I&D EXTREMITY Right 04/14/2016   Procedure: IRRIGATION AND DEBRIDEMENT EXTREMITY;  Surgeon: Leanora Cover, MD;  Location: [facility];  Service: Orthopedics;  Laterality: Right;  . JOINT REPLACEMENT  2012   RIGHT TOTAL HIP REPLACEMENT  . Right knee arthroscopic surgery    . SPINE SURGERY     CERVIAL 6-7 SURGERY - FUSION  . TONSILECTOMY, ADENOIDECTOMY, BILATERAL MYRINGOTOMY AND TUBES    . TOTAL HIP ARTHROPLASTY Left 08/24/2012   Procedure: LEFT TOTAL HIP ARTHROPLASTY ANTERIOR APPROACH;  Surgeon: [name], MD;  Location: [facility];  Service: Orthopedics;  Laterality:  Left;  . Total hip replacement      OB History    No data available       Home Medications    Prior to Admission medications   Medication Sig Start Date End Date Taking? Authorizing Provider  acetaminophen (TYLENOL) 325 MG tablet Take 2 tablets (650 mg total) by mouth every 6 (six) hours as needed for mild pain, moderate pain or fever. 04/19/16  Yes Modena Jansky, MD  ALPRAZolam Duanne Moron) 1 MG tablet Take 1 mg by mouth 2 (two) times daily as needed for anxiety.  03/19/16  Yes Historical Provider, MD  amphetamine-dextroamphetamine (ADDERALL) 10 MG tablet Take 10 mg by mouth 2 (two) times daily. 03/28/16  Yes Historical Provider, MD  lisinopril (PRINIVIL,ZESTRIL) 5 MG tablet Take 1 tablet (5 mg total) by mouth daily. 08/14/14  Yes Liam Graham, PA-C  pantoprazole (PROTONIX) 40 MG tablet Take 1 tablet (40 mg total) by mouth 2 (two) times daily before a meal. 04/19/16  Yes Modena Jansky, MD  QUEtiapine (SEROQUEL) 200 MG tablet Take 200 mg by mouth at bedtime as needed (mood, agitation).  03/19/16  Yes Historical Provider, MD  traMADol (ULTRAM) 50 MG tablet Take 1 tablet (50 mg total) by mouth every 6 (six) hours as needed. 05/09/16   Mia Creamer, NP    Family History Family History  Problem Relation Age of Onset  . Breast cancer Sister   . Cancer Sister   . Heart disease Mother   . Heart disease Father     Social History Social History  Substance Use Topics  . Smoking status: Current Some Day Smoker    Packs/day: 0.50    Years: 20.00    Types: Cigarettes  . Smokeless tobacco: Never Used  . Alcohol use No     Comment: HX OF ABUSE OF PAIN MEDS--PT IS CURRENTLY IN TREATMENT AT [facility] IN [place]     Allergies   Albuterol; Aspirin; and Adhesive [tape]   Review of Systems Review of Systems  Constitutional: Positive for chills and fever.  Respiratory: Positive for cough.   Genitourinary: Negative for dysuria and hematuria.  Musculoskeletal:  Positive for arthralgias and myalgias.  All other systems reviewed and are negative.    Physical Exam Updated Vital Signs BP 106/55 (BP Location: Right Arm)   Pulse 102   Temp 98.3 F (36.8 C) (Oral)   Resp 18   SpO2 97%   Physical Exam  Constitutional: She appears well-developed and well-nourished.  HENT:  Head: Normocephalic.  Eyes: Conjunctivae are normal.  Cardiovascular: Normal rate.   Pulmonary/Chest: Effort normal. No respiratory distress.  Abdominal: She exhibits no distension.  Musculoskeletal: Normal range of motion.  Neurological: She is alert.  Skin: Skin is warm and dry.  Psychiatric: She has a normal mood and affect. Her behavior is normal.  Nursing note and vitals reviewed.    ED Treatments / Results  DIAGNOSTIC STUDIES: Oxygen Saturation is 100% on RA, normal  by my interpretation.    COORDINATION OF CARE: 12:57 AM Discussed treatment plan with pt at bedside  and pt agreed to plan.  Labs (all labs ordered are listed, but only abnormal results are displayed) Labs Reviewed  CBC WITH DIFFERENTIAL/PLATELET - Abnormal; Notable for the following:       Result Value   RBC 3.74 (*)    Hemoglobin 11.2 (*)    HCT 33.0 (*)    Platelets 105 (*)    Lymphs Abs 0.3 (*)    All other components within normal limits  D-DIMER, QUANTITATIVE (NOT AT [facility]) - Abnormal; Notable for the following:    D-Dimer, Quant 3.64 (*)    All other components within normal limits  URINALYSIS, ROUTINE W REFLEX MICROSCOPIC - Abnormal; Notable for the following:    APPearance CLOUDY (*)    Hgb urine dipstick SMALL (*)    Leukocytes, UA LARGE (*)    Bacteria, UA RARE (*)    Squamous Epithelial / LPF 6-30 (*)    All other components within normal limits  I-STAT CHEM 8, ED - Abnormal; Notable for the following:    Creatinine, Ser 1.20 (*)    Hemoglobin 11.9 (*)    HCT 35.0 (*)    All other components within normal limits  I-STAT CG4 LACTIC ACID, ED - Abnormal; Notable for the  following:    Lactic Acid, Venous 2.77 (*)    All other components within normal limits  CULTURE, BLOOD (ROUTINE X 2)  CULTURE, BLOOD (ROUTINE X 2)  I-STAT CG4 LACTIC ACID, ED    EKG  EKG Interpretation None       Radiology Dg Chest 2 View  Result Date: 05/09/2016 CLINICAL DATA:  Recent pneumonia, feeling unwell for 3 days. Fever today. EXAM: CHEST  2 VIEW COMPARISON:  Chest radiograph April 16, 2016 FINDINGS: Cardiomediastinal silhouette is normal. Calcified aortic knob. Blunting of the RIGHT costophrenic angle with faint residual sub pleural density. LEFT lung is clear. Trachea projects midline and there is no pneumothorax. ACDF. Moderate hiatal hernia. IMPRESSION: RIGHT lung base scarring/ resolving pneumonia. Electronically Signed   By: Elon Alas M.D.   On: 05/09/2016 01:56   Ct Angio Chest Pe W Or Wo Contrast  Result Date: 05/09/2016 CLINICAL DATA:  55 year old female with shortness of breath and elevated D-dimer. Patient presenting with weakness and nonproductive cough. Being treated for pneumonia with antibiotic. EXAM: CT ANGIOGRAPHY CHEST WITH CONTRAST TECHNIQUE: Multidetector CT imaging of the chest was performed using the standard protocol during bolus administration of intravenous contrast. Multiplanar CT image reconstructions and MIPs were obtained to evaluate the vascular anatomy. CONTRAST:  100 cc Isovue 370 COMPARISON:  Multiple chest radiographs dating back to 09/03/2011 FINDINGS: Cardiovascular: Top-normal cardiac size. There is no pericardial effusion. There is coronary vascular calcification involving the left main and LAD. There is atherosclerotic calcification of the thoracic aorta. No aneurysmal dilatation or evidence of dissection. The origins of the great vessels of the aortic arch appear patent. There is no CT evidence of pulmonary embolism. Mediastinum/Nodes: There is no hilar or mediastinal adenopathy. There is a moderate size hiatal hernia multiple  top-normal lymph nodes adjacent to the hernia, likely reactive. Esophagus is otherwise unremarkable. No thyroid nodules identified. Lungs/Pleura: There is minimal bibasilar atelectatic changes of the lungs. There is a 2.2 x 1.2 cm pleural based nodular density with irregular margins in the right lower lobe (series 5, image 187). There is areas of higher attenuation involving this nodule, possibly related to  scarring and less likely enhancement. Although this nodule may represent scarring, developing neoplasm is not excluded. Further evaluation with PET-CT is recommended. The lungs are otherwise clear. There is no pleural effusion or pneumothorax. The central airways are patent. Upper Abdomen: There is morphologic changes of cirrhosis. Top-normal spleen size measuring up to 13 cm. Musculoskeletal: Mild diffuse subcutaneous stranding and edema. No axillary adenopathy. No fluid collection. No acute osseous pathology. Review of the MIP images confirms the above findings. IMPRESSION: No acute intrathoracic pathology. No CT evidence of pulmonary embolism. Right lower lobe pleural based nodular density with irregular margins. Although this may represent scarring, a neoplastic process is not entirely excluded. Further evaluation with PET-CT is recommended Cirrhosis. Electronically Signed   By: Anner Crete M.D.   On: 05/09/2016 05:37    Procedures Procedures (including critical care time)  Medications Ordered in ED Medications  sodium chloride 0.9 % injection (not administered)  iopamidol (ISOVUE-370) 76 % injection (not administered)  traMADol (ULTRAM) tablet 50 mg (not administered)  acetaminophen (TYLENOL) tablet 650 mg (650 mg Oral Given 05/09/16 0116)  sodium chloride 0.9 % bolus 1,000 mL (0 mLs Intravenous Stopped 05/09/16 0309)  sodium chloride 0.9 % bolus 1,200 mL (0 mLs Intravenous Stopped 05/09/16 0435)  fentaNYL (SUBLIMAZE) injection 50 mcg (50 mcg Intravenous Given 05/09/16 0348)  iopamidol  (ISOVUE-370) 76 % injection 100 mL (100 mLs Intravenous Contrast Given 05/09/16 0509)     Initial Impression / Assessment and Plan / ED Course  I have reviewed the triage vital signs and the nursing notes.  Pertinent labs & imaging results that were available during my care of the patient were reviewed by me and considered in my medical decision making (see chart for details).  Clinical Course      Patient was given fluids due to an elevated serum lactate.  This normalized after 2 L of fluid.  CT AngioJet of her chest reveals no pneumonia.  No PE.  No pathology to be causing her subjective dyspnea patient has remained afebrile in the emergency department.  She'll be discharged home with a prescription for Ultram, and with instructions to follow-up with her PCPs.  She states she doesn't know when she supposed to have appointment.  She was just told in 2 weeks and instructed her that she needs to call the office and stated that the doctor one to see her in 2 weeks and get a specific date.  Final Clinical Impressions(s) / ED Diagnoses   Final diagnoses:  Dyspnea, unspecified type  Myalgia    New Prescriptions New Prescriptions   TRAMADOL (ULTRAM) 50 MG TABLET    Take 1 tablet (50 mg total) by mouth every 6 (six) hours as needed.  I personally performed the services described in this documentation, which was scribed in my presence. The recorded information has been reviewed and is accurate.    Mia Creamer, NP 05/09/16 [id number]    Daleen Bo, MD 05/09/16 [phone]

## 2016-05-09 NOTE — ED Notes (Signed)
RN will get lactic acid

## 2016-05-14 DIAGNOSIS — F1990 Other psychoactive substance use, unspecified, uncomplicated: Secondary | ICD-10-CM | POA: Insufficient documentation

## 2016-05-14 LAB — CULTURE, BLOOD (ROUTINE X 2)
CULTURE: NO GROWTH
CULTURE: NO GROWTH

## 2016-05-15 ENCOUNTER — Ambulatory Visit (INDEPENDENT_AMBULATORY_CARE_PROVIDER_SITE_OTHER): Payer: Medicare Other | Admitting: Internal Medicine

## 2016-05-15 ENCOUNTER — Encounter: Payer: Self-pay | Admitting: Internal Medicine

## 2016-05-15 DIAGNOSIS — Z23 Encounter for immunization: Secondary | ICD-10-CM | POA: Diagnosis not present

## 2016-05-15 DIAGNOSIS — R7881 Bacteremia: Secondary | ICD-10-CM | POA: Diagnosis not present

## 2016-05-15 DIAGNOSIS — F1990 Other psychoactive substance use, unspecified, uncomplicated: Secondary | ICD-10-CM

## 2016-05-15 DIAGNOSIS — L02511 Cutaneous abscess of right hand: Secondary | ICD-10-CM | POA: Diagnosis not present

## 2016-05-15 DIAGNOSIS — B9562 Methicillin resistant Staphylococcus aureus infection as the cause of diseases classified elsewhere: Secondary | ICD-10-CM

## 2016-05-15 MED ORDER — DOXYCYCLINE HYCLATE 100 MG PO TABS: 100.0000 mg | ORAL_TABLET | Freq: Two times a day (BID) | ORAL | 0 refills | Status: DC

## 2016-05-15 NOTE — Assessment & Plan Note (Signed)
It appears that her right hand abscess is responded well to incision and drainage and initial course of antibiotics. I will treat her with oral doxycycline now and see her back in one month.

## 2016-05-15 NOTE — Assessment & Plan Note (Signed)
I suspect that her transient bacteremia has been cured.

## 2016-05-15 NOTE — Progress Notes (Signed)
[name] for Infectious Disease  Patient Active Problem List   Diagnosis Date Noted  . MRSA bacteremia 04/15/2016    Priority: High  . Abscess of right hand 04/14/2016    Priority: High  . Community acquired pneumonia of right lower lobe of lung ([place]) 09/04/2011    Priority: High  . IVDU (intravenous drug user) 05/14/2016    Priority: Medium  . Polysubstance abuse     Priority: Medium  . Cirrhosis of liver without ascites (HCC)     Priority: Medium  . History of bipolar disorder 10/05/2014    Priority: Medium  . Hepatitis C infection     Priority: Medium  . Upper GI bleed   . Bipolar affective disorder ([place]) 04/15/2016  . ARF (acute renal failure) ([place]) 10/05/2014  . History of cirrhosis 10/05/2014  . Essential hypertension 10/05/2014  . S/P left THA, AA 08/25/2012  . Expected blood loss anemia 08/25/2012  . Obesity (BMI 30-39.9) 08/25/2012  . Pancytopenia 09/08/2011  . Anemia 09/04/2011  . PUD (peptic ulcer disease)   . Thrombocytopenia ([place])     Patient's Medications  New Prescriptions   DOXYCYCLINE (VIBRA-TABS) 100 MG TABLET    Take 1 tablet (100 mg total) by mouth 2 (two) times daily.  Previous Medications   ACETAMINOPHEN (TYLENOL) 325 MG TABLET    Take 2 tablets (650 mg total) by mouth every 6 (six) hours as needed for mild pain, moderate pain or fever.   ALPRAZOLAM (XANAX) 1 MG TABLET    Take 1 mg by mouth 2 (two) times daily as needed for anxiety.    AMPHETAMINE-DEXTROAMPHETAMINE (ADDERALL) 10 MG TABLET    Take 10 mg by mouth 2 (two) times daily.   LISINOPRIL (PRINIVIL,ZESTRIL) 5 MG TABLET    Take 1 tablet (5 mg total) by mouth daily.   PANTOPRAZOLE (PROTONIX) 40 MG TABLET    Take 1 tablet (40 mg total) by mouth 2 (two) times daily before a meal.   QUETIAPINE (SEROQUEL) 200 MG TABLET    Take 200 mg by mouth at bedtime as needed (mood, agitation).    TRAMADOL (ULTRAM) 50 MG TABLET    Take 1 tablet (50 mg total) by mouth every 6 (six) hours as  needed.  Modified Medications   No medications on file  Discontinued Medications   No medications on file    Subjective: [name] is in for her hospital follow-up visit. She has a history of bipolar disorder and injecting drug use. She was admitted to the hospital on 04/14/2016 with a right hand abscess and right lower lobe pneumonia. One of 2 admission blood cultures grew MRSA. She underwent incision and drainage. Repeat blood cultures were negative. There was no evidence of endocarditis by transthoracic echocardiogram. She was seen by my partner, Dr. Tommy Medal, who elected to treat her with long-acting oritavancin on 04/18/2016 just before discharge. A second dose of outpatient dalbavancin was arranged but it appears that she missed that appointment. She has not been having any fever. She is complaining of right hand pain and requesting narcotic pain medications. She is not in any drug treatment programs.  Review of Systems: Review of Systems  Constitutional: Negative for chills, diaphoresis and fever.  Respiratory: Positive for cough. Negative for sputum production and shortness of breath.   Musculoskeletal: Positive for joint pain.  Psychiatric/Behavioral: Positive for depression and substance abuse. Negative for suicidal ideas.    Past Medical History:  Diagnosis Date  .  Anxiety   . Bipolar affective disorder ([place])    ADHD AND SCHZIOPHRENIC--PT GOES TO [facility]. BLANKMAN  . Blood transfusion without reported diagnosis   . Cervical radiculopathy   . Chronic pain syndrome   . Complication of anesthesia    STATES SHE WOKE UP TWICE DURING SURGERIES  . DJD (degenerative joint disease)   . GERD (gastroesophageal reflux disease)   . H/O hiatal hernia   . Hepatitis C infection    STATES COMPLETED TREATMENT AND NO LONGER HAS INFECTION  . Hypertension   . Leukocytopenia   . Osteoarthritis   . Osteoporosis   . Peptic ulcer disease   . Pneumonia 2013  . Polysubstance  (excluding opioids) dependence ([place])   . Seizures (HCC)    SEIZURE AFTER BREATHING TX (ALBUTEROL) IN ER; ALSO PAST HX SEIZURES DURING DRUG DETOX  . Shortness of breath    WITH EXERTION; SMOKER  . Thrombocytopenia ([place])     Social History  Substance Use Topics  . Smoking status: Current Some Day Smoker    Packs/day: 0.25    Years: 20.00    Types: Cigarettes  . Smokeless tobacco: Never Used     Comment: 4/day 05/15/16  . Alcohol use No     Comment: HX OF ABUSE OF PAIN MEDS--PT IS CURRENTLY IN TREATMENT AT [facility] IN [place]    Family History  Problem Relation Age of Onset  . Breast cancer Sister   . Cancer Sister   . Heart disease Mother   . Heart disease Father     Allergies  Allergen Reactions  . Albuterol Other (See Comments)    PT states she had seizures from an albuterol treatment and was hospitalized for it  . Aspirin Hives    Vomits blood  . Adhesive [Tape] Rash    Objective: Vitals:   05/15/16 0942  BP: (!) 175/94  Pulse: 86  Temp: 97.3 F (36.3 C)  TempSrc: Oral  Weight: 140 lb 8 oz (63.7 kg)  Height: 5\' 7"  (1.702 m)   Body mass index is 22.01 kg/m.  Physical Exam  Constitutional:  She is nervous and fidgety.  HENT:  Mouth/Throat: No oropharyngeal exudate.  Eyes: Conjunctivae are normal.  Cardiovascular: Normal rate and regular rhythm.   No murmur heard. Pulmonary/Chest: Effort normal and breath sounds normal. She has no wheezes. She has no rales.  Musculoskeletal:  Her right hand incisions are healing well. There is no drainage. She has no cellulitis or fluctuance.    Lab Results    Problem List Items Addressed This Visit      High   Abscess of right hand    It appears that her right hand abscess is responded well to incision and drainage and initial course of antibiotics. I will treat her with oral doxycycline now and see her back in one month.      Relevant Medications   doxycycline (VIBRA-TABS) 100 MG tablet     MRSA bacteremia    I suspect that her transient bacteremia has been cured.        Medium   IVDU (intravenous drug user)       Michel Bickers, MD [facility] for [place] [phone] pager   [phone] cell 05/15/2016, 10:13 AM

## 2016-06-19 ENCOUNTER — Ambulatory Visit: Payer: Medicare Other | Admitting: Internal Medicine

## 2017-02-02 ENCOUNTER — Emergency Department (HOSPITAL_COMMUNITY): Payer: Medicare Other

## 2017-02-02 ENCOUNTER — Encounter (HOSPITAL_COMMUNITY): Payer: Self-pay

## 2017-02-02 ENCOUNTER — Emergency Department (HOSPITAL_COMMUNITY)
Admission: EM | Admit: 2017-02-02 | Discharge: 2017-02-03 | Disposition: A | Discharge status: Home / Self Care | Payer: Medicare Other | Attending: Emergency Medicine | Admitting: Emergency Medicine

## 2017-02-02 DIAGNOSIS — Y929 Unspecified place or not applicable: Secondary | ICD-10-CM | POA: Insufficient documentation

## 2017-02-02 DIAGNOSIS — I10 Essential (primary) hypertension: Secondary | ICD-10-CM | POA: Diagnosis not present

## 2017-02-02 DIAGNOSIS — S79911A Unspecified injury of right hip, initial encounter: Secondary | ICD-10-CM | POA: Diagnosis present

## 2017-02-02 DIAGNOSIS — Z96643 Presence of artificial hip joint, bilateral: Secondary | ICD-10-CM | POA: Diagnosis not present

## 2017-02-02 DIAGNOSIS — F17210 Nicotine dependence, cigarettes, uncomplicated: Secondary | ICD-10-CM | POA: Insufficient documentation

## 2017-02-02 DIAGNOSIS — X509XXA Other and unspecified overexertion or strenuous movements or postures, initial encounter: Secondary | ICD-10-CM | POA: Diagnosis not present

## 2017-02-02 DIAGNOSIS — Y999 Unspecified external cause status: Secondary | ICD-10-CM | POA: Insufficient documentation

## 2017-02-02 DIAGNOSIS — Z79899 Other long term (current) drug therapy: Secondary | ICD-10-CM | POA: Diagnosis not present

## 2017-02-02 DIAGNOSIS — F909 Attention-deficit hyperactivity disorder, unspecified type: Secondary | ICD-10-CM | POA: Insufficient documentation

## 2017-02-02 DIAGNOSIS — S7001XA Contusion of right hip, initial encounter: Secondary | ICD-10-CM | POA: Insufficient documentation

## 2017-02-02 DIAGNOSIS — Y939 Activity, unspecified: Secondary | ICD-10-CM | POA: Diagnosis not present

## 2017-02-02 NOTE — ED Triage Notes (Signed)
Patient was driven to ER via POV today with c/o right hip pain, radiating down her leg and up her left back. Patient reports having a history of bilateral hip replacements. Patient reports having tripped over a bag on the floor and falling yesterday, hitting her nose on the wall, and landing on her right hip. Patient reports having difficulty ambulating since. Patient states "im afraid I broke it."

## 2017-02-03 MED ORDER — HYDROMORPHONE HCL 1 MG/ML IJ SOLN
1.0000 mg | Freq: Once | INTRAMUSCULAR | Status: AC
  Administered 2017-02-03: 1 mg via INTRAMUSCULAR
  Filled 2017-02-03: qty 1

## 2017-02-03 MED ORDER — OXYCODONE-ACETAMINOPHEN 5-325 MG PO TABS: 1.0000 | ORAL_TABLET | ORAL | 0 refills | Status: DC | PRN

## 2017-02-03 MED ORDER — ONDANSETRON 8 MG PO TBDP
8.0000 mg | ORAL_TABLET | Freq: Once | ORAL | Status: AC
  Administered 2017-02-03: 8 mg via ORAL
  Filled 2017-02-03: qty 1

## 2017-02-03 NOTE — ED Provider Notes (Signed)
[facility] DEPT Provider Note   CSN: [id number] Arrival date & time: 02/02/17  1836     History   Chief Complaint Chief Complaint  Patient presents with  . Fall  . Hip Pain    right    HPI [name] is a 56 y.o. female.  Patient presents to the emergency room for evaluation of right hip pain. Patient reports that she fell last night. She has been having severe pain on the outside part of her hip since the fall. She reports thatshe is afraid that she might have broken her hip again, has a history of bilateral hip replacement. Pain is constant but worsens with trying to put any weight on it.      Past Medical History:  Diagnosis Date  . Anxiety   . Bipolar affective disorder ([place])    ADHD AND SCHZIOPHRENIC--PT GOES TO [facility] [place]. BLANKMAN  . Blood transfusion without reported diagnosis   . Cervical radiculopathy   . Chronic pain syndrome   . Complication of anesthesia    STATES SHE WOKE UP TWICE DURING SURGERIES  . DJD (degenerative joint disease)   . GERD (gastroesophageal reflux disease)   . H/O hiatal hernia   . Hepatitis C infection    STATES COMPLETED TREATMENT AND NO LONGER HAS INFECTION  . Hypertension   . Leukocytopenia   . Osteoarthritis   . Osteoporosis   . Peptic ulcer disease   . Pneumonia 2013  . Polysubstance (excluding opioids) dependence ([place])   . Seizures (HCC)    SEIZURE AFTER BREATHING TX (ALBUTEROL) IN ER; ALSO PAST HX SEIZURES DURING DRUG DETOX  . Shortness of breath    WITH EXERTION; SMOKER  . Thrombocytopenia [facility])     Patient Active Problem List   Diagnosis Date Noted  . IVDU (intravenous drug user) 05/14/2016  . Polysubstance abuse   . Upper GI bleed   . Cirrhosis of liver without ascites ([place])   . MRSA bacteremia 04/15/2016  . Bipolar affective disorder ([place]) 04/15/2016  . Abscess of right hand 04/14/2016  . ARF (acute renal failure) ([place]) 10/05/2014  . History of bipolar disorder 10/05/2014  . History  of cirrhosis 10/05/2014  . Essential hypertension 10/05/2014  . S/P left THA, AA 08/25/2012  . Expected blood loss anemia 08/25/2012  . Obesity (BMI 30-39.9) 08/25/2012  . Pancytopenia 09/08/2011  . Community acquired pneumonia of right lower lobe of lung ([place]) 09/04/2011  . Anemia 09/04/2011  . PUD (peptic ulcer disease)   . Thrombocytopenia ([place])   . Hepatitis C infection     Past Surgical History:  Procedure Laterality Date  . ABDOMINAL HYSTERECTOMY    . APPENDECTOMY    . CHOLECYSTECTOMY    . ESOPHAGOGASTRODUODENOSCOPY N/A 04/18/2016   Procedure: ESOPHAGOGASTRODUODENOSCOPY (EGD);  Surgeon: Carol Ada, MD;  Location: [facility] ENDOSCOPY;  Service: Endoscopy;  Laterality: N/A;  . Hyperectomy     due to obstructive ovarian cytst  . I&D EXTREMITY Right 04/14/2016   Procedure: IRRIGATION AND DEBRIDEMENT EXTREMITY;  Surgeon: Leanora Cover, MD;  Location: [facility];  Service: Orthopedics;  Laterality: Right;  . JOINT REPLACEMENT  2012   RIGHT TOTAL HIP REPLACEMENT  . Right knee arthroscopic surgery    . SPINE SURGERY     CERVIAL 6-7 SURGERY - FUSION  . TONSILECTOMY, ADENOIDECTOMY, BILATERAL MYRINGOTOMY AND TUBES    . TOTAL HIP ARTHROPLASTY Left 08/24/2012   Procedure: LEFT TOTAL HIP ARTHROPLASTY ANTERIOR APPROACH;  Surgeon: [name], MD;  Location:  [facility];  Service: Orthopedics;  Laterality: Left;  . Total hip replacement      OB History    No data available       Home Medications    Prior to Admission medications   Medication Sig Start Date End Date Taking? Authorizing Provider  ALPRAZolam Duanne Moron) 1 MG tablet Take 1 mg by mouth 2 (two) times daily as needed. 12/09/16  Yes [provider]  amphetamine-dextroamphetamine (ADDERALL) 10 MG tablet Take 10 mg by mouth 2 (two) times daily. 03/28/16  Yes [provider]  lisinopril (PRINIVIL,ZESTRIL) 5 MG tablet Take 1 tablet (5 mg total) by mouth daily. 08/14/14  Yes Baker, Zachary H, PA-C  QUEtiapine (SEROQUEL) 200 MG  tablet Take 200 mg by mouth at bedtime as needed (mood, agitation).  03/19/16  Yes [provider]  acetaminophen (TYLENOL) 325 MG tablet Take 2 tablets (650 mg total) by mouth every 6 (six) hours as needed for mild pain, moderate pain or fever. 04/19/16   Hongalgi, Lenis Dickinson, MD  ALPRAZolam Duanne Moron) 1 MG tablet Take 1 mg by mouth 2 (two) times daily as needed for anxiety.  03/19/16   [provider]  amphetamine-dextroamphetamine (ADDERALL) 20 MG tablet Take 20 mg by mouth 3 (three) times daily. 01/09/17   [provider]  doxycycline (VIBRA-TABS) 100 MG tablet Take 1 tablet (100 mg total) by mouth 2 (two) times daily. 05/15/16   Michel Bickers, MD  oxyCODONE-acetaminophen (PERCOCET) 5-325 MG tablet Take 1-2 tablets by mouth every 4 (four) hours as needed. 02/03/17   Orpah Greek, MD  pantoprazole (PROTONIX) 40 MG tablet Take 1 tablet (40 mg total) by mouth 2 (two) times daily before a meal. 04/19/16   Hongalgi, Lenis Dickinson, MD  traMADol (ULTRAM) 50 MG tablet Take 1 tablet (50 mg total) by mouth every 6 (six) hours as needed. Patient not taking: Reported on 05/15/2016 05/09/16   Junius Creamer, NP    Family History Family History  Problem Relation Age of Onset  . Breast cancer Sister   . Cancer Sister   . Heart disease Mother   . Heart disease Father     Social History Social History  Substance Use Topics  . Smoking status: Current Some Day Smoker    Packs/day: 0.25    Years: 20.00    Types: Cigarettes  . Smokeless tobacco: Never Used     Comment: 4/day 05/15/16  . Alcohol use No     Comment: HX OF ABUSE OF PAIN MEDS--PT IS CURRENTLY IN TREATMENT AT [facility] IN [place]     Allergies   Albuterol; Aspirin; and Adhesive [tape]   Review of Systems Review of Systems  Musculoskeletal: Positive for arthralgias.  All other systems reviewed and are negative.    Physical Exam Updated Vital Signs BP (!) 173/86 (BP Location: Left  Arm)   Pulse (!) 108   Temp 99.6 F (37.6 C) (Oral)   Resp 20   Ht 5\' 7"  (1.702 m)   Wt 68 kg (150 lb)   SpO2 100%   BMI 23.49 kg/m   Physical Exam  Constitutional: She is oriented to person, place, and time. She appears well-developed and well-nourished. No distress.  HENT:  Head: Normocephalic.  Right Ear: Hearing normal.  Left Ear: Hearing normal.  Mouth/Throat: Oropharynx is clear and moist and mucous membranes are normal.  Abrasion over bridge of nose, no crepitance  Eyes: Pupils are equal, round, and reactive to light. Conjunctivae and EOM are  normal.  Neck: Normal range of motion. Neck supple.  Cardiovascular: Regular rhythm, S1 normal and S2 normal.  Exam reveals no gallop and no friction rub.   No murmur heard. Pulmonary/Chest: Effort normal and breath sounds normal. No respiratory distress. She exhibits no tenderness.  Abdominal: Soft. Normal appearance and bowel sounds are normal. There is no hepatosplenomegaly. There is no tenderness. There is no rebound, no guarding, no tenderness at McBurney's point and negative Murphy's sign. No hernia.  Musculoskeletal: Normal range of motion.       Right hip: She exhibits tenderness.       Legs: Tenderness over right lateral hip area without deformity. No overlying contusion, bruising, abrasion or laceration.  Patient motion is preserved but painful  Neurological: She is alert and oriented to person, place, and time. She has normal strength. No cranial nerve deficit or sensory deficit. Coordination normal. GCS eye subscore is 4. GCS verbal subscore is 5. GCS motor subscore is 6.  Skin: Skin is warm, dry and intact. No rash noted. No cyanosis.  Psychiatric: She has a normal mood and affect. Her speech is normal and behavior is normal. Thought content normal.  Nursing note and vitals reviewed.    ED Treatments / Results  Labs (all labs ordered are listed, but only abnormal results are displayed) Labs Reviewed - No data to  display  EKG  EKG Interpretation None       Radiology Dg Hip Unilat  With Pelvis 2-3 Views Right  Result Date: 02/02/2017 CLINICAL DATA:  Patient fell on 02/01/2017 injuring the right hip. Patient has lateral hip pain radiating down leg. Difficulty weight-bearing. EXAM: DG HIP (WITH OR WITHOUT PELVIS) 2-3V RIGHT COMPARISON:  None. FINDINGS: The patient is status post bilateral total hip arthroplasties with uncemented femoral component. The left hip arthroplasty is only partially included on this study. The right hip arthroplasty with uncemented femoral component is intact without evidence of fracture or hardware failure. Osteoarthritis of the pubic symphysis with joint space narrowing and sclerosis is seen noted. The bony pelvis appears intact. IMPRESSION: No acute fracture of the pelvis and right hip. No dislocations or hardware failure of the included bilateral hip arthroplasties. Electronically Signed   By: Ashley Royalty M.D.   On: 02/02/2017 21:53    Procedures Procedures (including critical care time)  Medications Ordered in ED Medications  HYDROmorphone (DILAUDID) injection 1 mg (not administered)  ondansetron (ZOFRAN-ODT) disintegrating tablet 8 mg (not administered)     Initial Impression / Assessment and Plan / ED Course  I have reviewed the triage vital signs and the nursing notes.  Pertinent labs & imaging results that were available during my care of the patient were reviewed by me and considered in my medical decision making (see chart for details).     Patient presents to the emergency department for evaluation of right hip pain after a fall which occurred yesterday. She has tenderness over the lateral aspect of the hip without any obvious signs of injury on examination. X-ray shows previous hip replacement without fracture or dislocation. Patient reassured, no evidence of fracture, will treat with analgesia, rest, weightbearing as tolerated.  Final Clinical  Impressions(s) / ED Diagnoses   Final diagnoses:  Contusion of right hip, initial encounter    New Prescriptions New Prescriptions   OXYCODONE-ACETAMINOPHEN (PERCOCET) 5-325 MG TABLET    Take 1-2 tablets by mouth every 4 (four) hours as needed.     Orpah Greek, MD 02/03/17 [facility] Deeds

## 2017-02-03 NOTE — ED Notes (Signed)
Pt unable to bear wt on right leg required W/C to get to [facility] pt waiting ride home will medicate for pain just prior to discharge

## 2017-04-06 ENCOUNTER — Other Ambulatory Visit (HOSPITAL_COMMUNITY): Payer: Self-pay | Admitting: Orthopedic Surgery

## 2017-04-06 DIAGNOSIS — M25551 Pain in right hip: Secondary | ICD-10-CM

## 2017-04-08 ENCOUNTER — Encounter (HOSPITAL_COMMUNITY): Payer: Medicare Other

## 2017-04-08 ENCOUNTER — Other Ambulatory Visit: Payer: Self-pay | Admitting: Orthopedic Surgery

## 2017-04-08 ENCOUNTER — Encounter (HOSPITAL_COMMUNITY): Payer: Medicare Other | Attending: Orthopedic Surgery

## 2017-04-08 DIAGNOSIS — M25551 Pain in right hip: Secondary | ICD-10-CM

## 2017-04-20 ENCOUNTER — Emergency Department (HOSPITAL_COMMUNITY): Payer: Medicare Other

## 2017-04-20 ENCOUNTER — Emergency Department (HOSPITAL_COMMUNITY)
Admission: EM | Admit: 2017-04-20 | Discharge: 2017-04-20 | Disposition: A | Discharge status: Home / Self Care | Payer: Medicare Other | Attending: Emergency Medicine | Admitting: Emergency Medicine

## 2017-04-20 ENCOUNTER — Encounter (HOSPITAL_COMMUNITY): Payer: Self-pay | Admitting: Emergency Medicine

## 2017-04-20 DIAGNOSIS — G8929 Other chronic pain: Secondary | ICD-10-CM | POA: Diagnosis not present

## 2017-04-20 DIAGNOSIS — I10 Essential (primary) hypertension: Secondary | ICD-10-CM | POA: Diagnosis not present

## 2017-04-20 DIAGNOSIS — Z79899 Other long term (current) drug therapy: Secondary | ICD-10-CM | POA: Diagnosis not present

## 2017-04-20 DIAGNOSIS — M25551 Pain in right hip: Secondary | ICD-10-CM | POA: Insufficient documentation

## 2017-04-20 DIAGNOSIS — F17210 Nicotine dependence, cigarettes, uncomplicated: Secondary | ICD-10-CM | POA: Diagnosis not present

## 2017-04-20 LAB — BASIC METABOLIC PANEL
Anion gap: 8 (ref 5–15)
BUN: 16 mg/dL (ref 6–20)
CO2: 27 mmol/L (ref 22–32)
Calcium: 9 mg/dL (ref 8.9–10.3)
Chloride: 103 mmol/L (ref 101–111)
Creatinine, Ser: 0.8 mg/dL (ref 0.44–1.00)
GFR calc Af Amer: >60 mL/min (ref >60)
GFR calc non Af Amer: >60 mL/min (ref >60)
Glucose, Bld: 99 mg/dL (ref 65–99)
Potassium: 3.3 mmol/L — ABNORMAL LOW (ref 3.5–5.1)
Sodium: 138 mmol/L (ref 135–145)

## 2017-04-20 LAB — CBC WITH DIFFERENTIAL/PLATELET
Basophils Absolute: 0 10*3/uL (ref 0.0–0.1)
Basophils Relative: 0 %
Eosinophils Absolute: 0.1 10*3/uL (ref 0.0–0.7)
Eosinophils Relative: 1 %
HCT: 34.7 % — ABNORMAL LOW (ref 36.0–46.0)
Hemoglobin: 11 g/dL — ABNORMAL LOW (ref 12.0–15.0)
Lymphocytes Relative: 16 %
Lymphs Abs: 1.2 10*3/uL (ref 0.7–4.0)
MCH: 28.6 pg (ref 26.0–34.0)
MCHC: 31.7 g/dL (ref 30.0–36.0)
MCV: 90.4 fL (ref 78.0–100.0)
Monocytes Absolute: 0.5 10*3/uL (ref 0.1–1.0)
Monocytes Relative: 6 %
Neutro Abs: 5.6 10*3/uL (ref 1.7–7.7)
Neutrophils Relative %: 77 %
Platelets: 188 10*3/uL (ref 150–400)
RBC: 3.84 MIL/uL — ABNORMAL LOW (ref 3.87–5.11)
RDW: 14.3 % (ref 11.5–15.5)
WBC: 7.3 10*3/uL (ref 4.0–10.5)

## 2017-04-20 MED ORDER — MORPHINE SULFATE (PF) 4 MG/ML IV SOLN
4.0000 mg | Freq: Once | INTRAVENOUS | Status: AC
  Administered 2017-04-20: 4 mg via INTRAVENOUS
  Filled 2017-04-20: qty 1

## 2017-04-20 MED ORDER — KETOROLAC TROMETHAMINE 60 MG/2ML IM SOLN
30.0000 mg | Freq: Once | INTRAMUSCULAR | Status: AC
  Administered 2017-04-20: 30 mg via INTRAMUSCULAR
  Filled 2017-04-20: qty 2

## 2017-04-20 MED ORDER — ACETAMINOPHEN 325 MG PO TABS: 650.0000 mg | ORAL_TABLET | Freq: Four times a day (QID) | ORAL | 0 refills | Status: DC | PRN

## 2017-04-20 MED ORDER — CYCLOBENZAPRINE HCL 10 MG PO TABS: 10.0000 mg | ORAL_TABLET | Freq: Two times a day (BID) | ORAL | 0 refills | Status: DC | PRN

## 2017-04-20 MED ORDER — KETOROLAC TROMETHAMINE 60 MG/2ML IM SOLN: 60.0000 mg | Freq: Once | INTRAMUSCULAR | Status: DC

## 2017-04-20 NOTE — ED Notes (Signed)
Patient transported to X-ray 

## 2017-04-20 NOTE — Progress Notes (Signed)
CSW met with the pt at the request of the CM and explained why pt does not meet criteria for "Meals on Wheels" (pt is under 60 and has not been diagnosed as permanently disabled) and pt requested information on transportation for individuals with Medicaid.  CSW provided pt with [place] [employer] transportation services information and application, with [place]-funded Transportation Services and number/address ([place] DSS) for information and with information for [place]-funded [place] Transportation Coordiantor.  Pt was appreciative and thanked the CSW.  Please reconsult if future social work needs arise.  CSW signing off, as social work intervention is no longer needed.   F. , LCSW, LCAS, CSI Clinical Social Worker Ph: [phone]       

## 2017-04-20 NOTE — Discharge Instructions (Signed)
Please reach out to Dr. Alvan Dame for additional pain management.  You make take tylenol along with your oxycodone that was previously prescribed for pain. Take flexeril as needed for muscle spasm.

## 2017-04-20 NOTE — ED Notes (Signed)
Bed: [facility] Expected date:  Expected time:  Means of arrival:  Comments: 

## 2017-04-20 NOTE — Care Management Note (Addendum)
Case Management Note  Patient Details  Name: CHANON LONEY MRN: [id number] Date of Birth: 07/25/1960  Subjective/Objective:                  56 year old female with history of bipolar, chronic pain syndrome, polysubstance abuse, IVDU, osteoporosis brought here via EMS for evaluation of right hip pain.   Action/Plan: CM consulted for HHS and DME.  Spoke with pt who states she does not remember who she used in the past after her hip replacements.  Pt chose [facility].  Contacted [facility] who accepted the pt.  Pt currently has a walker and crutches at home.  Contacted Santiago Glad with [facility] for wheelchair who will bring them to the pt prior to transition home.  Pt received a 3-in-24 August 2012 and does no qualify for a insurance covered 3-in-1 again for another 6 months.  Pt does not want to pay privately for a shower stool or 3-in-1.  Pt asked about possible SNF rehab; CM explained that insurance would not cover that stay.  Pt stated she has Medicaid.  Advised her that the [facility] team could help with facility placement if needed after their assessments.  Additionally pt asked about food assistance, she hasn't been able to cook due to her pain.  Advised her that the [facility] CSW would be able to assist with that.  CM asked primary RN Claiborne Billings to send pt home with a female urinal due to pt "peeing in a cup" at home and that pt will need to be transported by [facility].  Pt has been instructed to make sure family comes to the ED to collect her DME.  No further CM needs noted at this time.  Expected Discharge Date:    04/20/2017              Expected Discharge Plan:  [place]  Discharge planning Services  CM Consult  Post Acute Care Choice:  Durable Medical Equipment, Home Health Choice offered to:  Patient  DME Arranged:  3-N-1, Wheelchair manual DME Agency:  [place]:  RN, PT, OT, Nurse's Aide, Social Work [employer] Agency:  [employer]  Status of Service:  Completed,  signed off  Leyanna Bittman, Benjaman Lobe, RN 04/20/2017, 4:39 PM

## 2017-04-20 NOTE — ED Provider Notes (Signed)
[facility] DEPT Provider Note   CSN: [id number] Arrival date & time: 04/20/17  1215     History   Chief Complaint Chief Complaint  Patient presents with  . Hip Pain    HPI [name] is a 56 y.o. female.  HPI   56 year old female with history of bipolar, chronic pain syndrome, polysubstance abuse, IVDU, osteoporosis brought here via EMS for evaluation of right hip pain.  Patient report she had a mechanical fall 3 months ago and injured her right hip.  She was seen in the ER several times for hip pain after the fall.  She has negative x-rays but did have an MRI of her right hip according to the patient that shows some abnormalities.  She follow up with her orthopedist, Dr. Alvan Dame last visit was 7 days ago.  She was set up to have a bone scan as well as a right knee aspiration procedure in the next few days.  However for the past 6 days she has had increased worsening pain to right or right hip.  She described the pain as a sharp sensation, sometimes radiates down the back of her thigh towards the knee, worse with any movement.  Pain is so intense, patient having been getting out of bed to care for herself due to the pain.  States she urinated in a cup at bedside and having been eating since she does not get out of bed.  She does not have much help around the house.  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  She did report subjective fever transiently several days prior.  No nausea vomiting diarrhea and denies any bowel bladder incontinence or saddle anesthesia.  She does not complain of any lower back pain.  She has been taking her prescribed pain medication, but states it did not provide adequate relief.  Past Medical History:  Diagnosis Date  . Anxiety   . Bipolar affective disorder ([place])    ADHD AND SCHZIOPHRENIC--PT GOES TO [facility]. BLANKMAN  .  Blood transfusion without reported diagnosis   . Cervical radiculopathy   . Chronic pain syndrome   . Complication of anesthesia    STATES SHE WOKE UP TWICE DURING SURGERIES  . DJD (degenerative joint disease)   . GERD (gastroesophageal reflux disease)   . H/O hiatal hernia   . Hepatitis C infection    STATES COMPLETED TREATMENT AND NO LONGER HAS INFECTION  . Hypertension   . Leukocytopenia   . Osteoarthritis   . Osteoporosis   . Peptic ulcer disease   . Pneumonia 2013  . Polysubstance (excluding opioids) dependence ([place])   . Seizures (HCC)    SEIZURE AFTER BREATHING TX (ALBUTEROL) IN ER; ALSO PAST HX SEIZURES DURING DRUG DETOX  . Shortness of breath    WITH EXERTION; SMOKER  . Thrombocytopenia [facility])     Patient Active Problem List   Diagnosis Date Noted  . IVDU (intravenous drug user) 05/14/2016  . Polysubstance abuse ([place])   . Upper GI bleed   . Cirrhosis of liver without ascites ([place])   . MRSA bacteremia 04/15/2016  . Bipolar affective disorder ([place]) 04/15/2016  . Abscess of right hand 04/14/2016  . ARF (acute renal failure) ([place]) 10/05/2014  . History of bipolar disorder 10/05/2014  . History of cirrhosis 10/05/2014  . Essential hypertension 10/05/2014  . S/P left THA, AA 08/25/2012  .  Expected blood loss anemia 08/25/2012  . Obesity (BMI 30-39.9) 08/25/2012  . Pancytopenia 09/08/2011  . Community acquired pneumonia of right lower lobe of lung ([place]) 09/04/2011  . Anemia 09/04/2011  . PUD (peptic ulcer disease)   . Thrombocytopenia ([place])   . Hepatitis C infection     Past Surgical History:  Procedure Laterality Date  . ABDOMINAL HYSTERECTOMY    . APPENDECTOMY    . CHOLECYSTECTOMY    . ESOPHAGOGASTRODUODENOSCOPY N/A 04/18/2016   Procedure: ESOPHAGOGASTRODUODENOSCOPY (EGD);  Surgeon: Carol Ada, MD;  Location[facility] END[facility]COPY;  Service: Endoscopy;  Laterality: N/A;  . Hyperectomy     due to obstructive ovarian cytst  . I&D EXTREMITY Right 04/14/2016    Procedure: IRRIGATION AND DEBRIDEMENT EXTREMITY;  Surgeon: Leanora Cover, MD;  Location: [facility];  Service: Orthopedics;  Laterality: Right;  . JOINT REPLACEMENT  2012   RIGHT TOTAL HIP REPLACEMENT  . Right knee arthroscopic surgery    . SPINE SURGERY     CERVIAL 6-7 SURGERY - FUSION  . TONSILECTOMY, ADENOIDECTOMY, BILATERAL MYRINGOTOMY AND TUBES    . TOTAL HIP ARTHROPLASTY Left 08/24/2012   Procedure: LEFT TOTAL HIP ARTHROPLASTY ANTERIOR APPROACH;  Surgeon: [name], MD;  Location: [facility] ORS;  Service: Orthopedics;  Laterality: Left;  . Total hip replacement      OB History    No data available       Home Medications    Prior to Admission medications   Medication Sig Start Date End Date Taking? Authorizing Provider  ALPRAZolam Duanne Moron) 1 MG tablet Take 1 mg by mouth 2 (two) times daily as needed for anxiety.  03/19/16  Yes [provider]  amphetamine-dextroamphetamine (ADDERALL) 20 MG tablet Take 20 mg by mouth 3 (three) times daily. 01/09/17  Yes [provider]  ibuprofen (ADVIL,MOTRIN) 200 MG tablet Take 400 mg by mouth every 6 (six) hours as needed for moderate pain.   Yes [provider]  lisinopril (PRINIVIL,ZESTRIL) 5 MG tablet Take 1 tablet (5 mg total) by mouth daily. 08/14/14  Yes Baker, Freeman Caldron, PA-C  oxyCODONE (OXY IR/ROXICODONE) 5 MG immediate release tablet take 1 tablet by mouth every 6 hours as needed for pain 04/10/17  Yes [provider]  QUEtiapine (SEROQUEL) 200 MG tablet Take 200 mg by mouth at bedtime as needed (mood, agitation).  03/19/16  Yes [provider]  acetaminophen (TYLENOL) 325 MG tablet Take 2 tablets (650 mg total) by mouth every 6 (six) hours as needed for mild pain, moderate pain or fever. Patient not taking: Reported on 04/20/2017 04/19/16   Modena Jansky, MD  oxyCODONE-acetaminophen (PERCOCET) 5-325 MG tablet Take 1-2 tablets by mouth every 4 (four) hours as needed. Patient not taking: Reported on  04/20/2017 02/03/17   Orpah Greek, MD  pantoprazole (PROTONIX) 40 MG tablet Take 1 tablet (40 mg total) by mouth 2 (two) times daily before a meal. Patient not taking: Reported on 02/03/2017 04/19/16   Modena Jansky, MD    Family History Family History  Problem Relation Age of Onset  . Breast cancer Sister   . Cancer Sister   . Heart disease Mother   . Heart disease Father     Social History Social History   Tobacco Use  . Smoking status: Current Some Day Smoker    Packs/day: 0.25    Years: 20.00    Pack years: 5.00    Types: Cigarettes  . Smokeless tobacco: Never Used  . Tobacco comment: 4/day 05/15/16  Substance Use Topics  . Alcohol use: No    Comment: HX OF ABUSE OF PAIN MEDS--PT IS CURRENTLY IN TREATMENT AT [facility]  . Drug use: No     Allergies   Albuterol; Aspirin; and Adhesive [tape]   Review of Systems Review of Systems  All other systems reviewed and are negative.    Physical Exam Updated Vital Signs BP 125/82   Pulse 94   Temp 98.1 F (36.7 C) (Oral)   Resp 18   SpO2 96%   Physical Exam  Constitutional: She appears well-developed and well-nourished. No distress.  Nontoxic in appearance  HENT:  Head: Atraumatic.  Eyes: Conjunctivae are normal.  Neck: Neck supple.  Cardiovascular: Normal rate, regular rhythm and intact distal pulses.  Pulmonary/Chest: Effort normal and breath sounds normal.  Abdominal: Soft. She exhibits no distension. There is no tenderness.  Musculoskeletal: She exhibits tenderness (Exquisite tenderness to palpation of the right hip with increasing pain through range of motion.  No deformity, no overlying skin changes.).  No significant midline spine tenderness  Neurological: She is alert. She displays normal reflexes.  Skin: No rash noted.  Psychiatric: She has a normal mood and affect.  Nursing note and vitals reviewed.    ED Treatments / Results  Labs (all labs ordered  are listed, but only abnormal results are displayed) Labs Reviewed  BASIC METABOLIC PANEL - Abnormal; Notable for the following components:      Result Value   Potassium 3.3 (*)    All other components within normal limits  CBC WITH DIFFERENTIAL/PLATELET - Abnormal; Notable for the following components:   RBC 3.84 (*)    Hemoglobin 11.0 (*)    HCT 34.7 (*)    All other components within normal limits  URINALYSIS, ROUTINE W REFLEX MICROSCOPIC    EKG  EKG Interpretation None       Radiology Dg Hip Unilat W Or Wo Pelvis 2-3 Views Right  Result Date: 04/20/2017 CLINICAL DATA:  Pt states she had right hip replacement 8 yrs ago and then fell 2 months ago. Pt states since fall she has had generalized right hip pain. EXAM: DG HIP (WITH OR WITHOUT PELVIS) 2-3V RIGHT COMPARISON:  02/02/2017 FINDINGS: The patient has had bilateral total hip arthroplasty. There is no acute fracture or subluxation. No evidence for hardware loosening or infection. IMPRESSION: No evidence for acute  abnormality. Electronically Signed   By: Nolon Nations M.D.   On: 04/20/2017 15:29    Procedures Procedures (including critical care time)  Medications Ordered in ED Medications  morphine 4 MG/ML injection 4 mg (not administered)     Initial Impression / Assessment and Plan / ED Course  I have reviewed the triage vital signs and the nursing notes.  Pertinent labs & imaging results that were available during my care of the patient were reviewed by me and considered in my medical decision making (see chart for details).     BP (!) 143/85 (BP Location: Left Arm)   Pulse 93   Temp 98.1 F (36.7 C) (Oral)   Resp 16   SpO2 96%    Final Clinical Impressions(s) / ED Diagnoses   Final diagnoses:  Acute right hip pain    ED Discharge Orders        Ordered    acetaminophen (TYLENOL) 325 MG tablet  Every 6 hours PRN     04/20/17 1658    cyclobenzaprine (FLEXERIL) 10 MG tablet  2 times daily PRN  04/20/17 1658     3:55 PM Patient here with progressive worsening right hip pain.  She reports her orthopedist, Dr. Alvan Dame schedule her to have right hip aspiration procedure as well as some type of bone scan in the near future, essentially work up to r/o septic joint. Her Epic Notes,  patient does have a fluoroscopy guided needle aspiration of a right hip schedule on 04/24/2017, as well as a nuclear medicine bone scan 3 phases on 04/27/17.  A screening x-ray of her right hip and pelvis, along with appropriate labs including CBC and BMP reviewed with no concerning features.  I appreciate consultation from orthopedist, Dr. Alvan Dame today who recommend for temporary pain management and encourage patient to follow-up outpatient for the remainder of her workup prior to surgery.  He recommended against additional opiate medication.  4:09 PM Due to patient's inability to move about at home, we have reached out to Case management to help facilitate face-to-face care at home while pt awaits the remainder of her schedule tests.       Durable Medical Equipment  (From admission, onward)        Start     Ordered   04/20/17 1627  For home use only DME 3 n 1  Once     04/20/17 1628   04/20/17 1627  For home use only DME Shower stool  Once     04/20/17 1628   04/20/17 1627  For home use only DME standard manual wheelchair with seat cushion  Once    Comments:  Patient suffers from R hip pain which impairs their ability to perform daily activities like bathing, grooming, feeding, toileting in the home.  A walker will not resolve  issue with performing activities of daily living. A wheelchair will allow patient to safely perform daily activities. Patient can safely propel the wheelchair in the home or has a caregiver who can provide assistance.  Accessories: elevating leg rests (ELRs), wheel locks, extensions and anti-tippers.   04/20/17 1628         Domenic Moras, PA-C 04/20/17 1656    Domenic Moras,  PA-C 04/20/17 1659    Duffy Bruce, MD 04/20/17 [phone]

## 2017-04-20 NOTE — ED Notes (Signed)
Care mgnt at bedside

## 2017-04-20 NOTE — ED Notes (Signed)
[facility] called for transport.  

## 2017-04-20 NOTE — ED Triage Notes (Signed)
Per EMS-right hip pain which is chronic-states she has been lying on her cough for 6 days-urinating into a drinking cup-history of hip replacement 8 years ago-Ortho MD states patient's body is rejecting hip-states she is to have surgery sometime in the near future

## 2017-04-24 ENCOUNTER — Ambulatory Visit
Admission: RE | Admit: 2017-04-24 | Discharge: 2017-04-24 | Disposition: A | Discharge status: Home / Self Care | Payer: Medicare Other | Source: Ambulatory Visit | Attending: Orthopedic Surgery | Admitting: Orthopedic Surgery

## 2017-04-24 DIAGNOSIS — M25551 Pain in right hip: Secondary | ICD-10-CM

## 2017-04-24 LAB — SYNOVIAL CELL COUNT + DIFF, W/ CRYSTALS
BASOPHILS, %: 0 %
Eosinophils-Synovial: 0 % (ref 0–2)
Lymphocytes-Synovial Fld: 14 % (ref 0–74)
Monocyte/Macrophage: 0 % (ref 0–69)
NEUTROPHIL, SYNOVIAL: 86 % — AB (ref 0–24)
SYNOVIOCYTES, %: 0 % (ref 0–15)
WBC, Synovial: 473400 cells/uL — ABNORMAL HIGH (ref <150)

## 2017-04-26 LAB — AEROBIC CULTURE
MICRO NUMBER: 81347914
SPECIMEN QUALITY:: ADEQUATE

## 2017-04-27 ENCOUNTER — Encounter (HOSPITAL_COMMUNITY)
Admission: RE | Admit: 2017-04-27 | Discharge: 2017-04-27 | Disposition: A | Discharge status: Home / Self Care | Payer: Medicare Other | Source: Ambulatory Visit | Attending: Orthopedic Surgery | Admitting: Orthopedic Surgery

## 2017-04-27 DIAGNOSIS — M25551 Pain in right hip: Secondary | ICD-10-CM | POA: Diagnosis not present

## 2017-04-27 MED ORDER — TECHNETIUM TC 99M MEDRONATE IV KIT: 25.0000 | PACK | Freq: Once | INTRAVENOUS | Status: DC | PRN

## 2017-04-29 LAB — ANAEROBIC CULTURE
MICRO NUMBER:: 81347913
SPECIMEN QUALITY: ADEQUATE

## 2017-04-30 NOTE — Telephone Encounter (Signed)
Error/disregard

## 2017-05-07 NOTE — Patient Instructions (Signed)
[name]  05/07/2017   Your procedure is scheduled on: 05-12-17  Report to [facility] Main  Entrance    Report to admitting at 1:30PM   Call this number if you have problems the morning of surgery [phone]   Remember: ONLY 1 PERSON MAY GO WITH YOU TO SHORT STAY TO GET  READY MORNING OF YOUR SURGERY.  Do not eat food After Midnight. YOU MAY HAVE CLEAR LIQUIDS FROM MIDNIGHT UNTIL 10AM DAY OF SURGERY. NOTHING BY MOUTH AFTER 10AM!     Take these medicines the morning of surgery with A SIP OF WATER: TYLENOL IF NEEDED, XANAX IF NEEDED                                You may not have any metal on your body including hair pins and              piercings  Do not wear jewelry, make-up, lotions, powders or perfumes, deodorant             Do not wear nail polish.  Do not shave  48 hours prior to surgery.            Do not bring valuables to the hospital. [place].  Contacts, dentures or bridgework may not be worn into surgery.  Leave suitcase in the car. After surgery it may be brought to your room.               Please read over the following fact sheets you were given: _____________________________________________________________________    CLEAR LIQUID DIET   Foods Allowed                                                                     Foods Excluded  Coffee and tea, regular and decaf                             liquids that you cannot  Plain Jell-O in any flavor                                             see through such as: Fruit ices (not with fruit pulp)                                     milk, soups, orange juice  Iced Popsicles                                    All solid food Carbonated beverages, regular and diet  Cranberry, grape and apple juices Sports drinks like Gatorade Lightly seasoned clear broth or consume(fat free) Sugar, honey  syrup  Sample Menu Breakfast                                Lunch                                     Supper Cranberry juice                    Beef broth                            Chicken broth Jell-O                                     Grape juice                           Apple juice Coffee or tea                        Jell-O                                      Popsicle                                                Coffee or tea                        Coffee or tea  _____________________________________________________________________              [facility] - Preparing for Surgery Before surgery, you can play an important role.  Because skin is not sterile, your skin needs to be as free of germs as possible.  You can reduce the number of germs on your skin by washing with CHG (chlorahexidine gluconate) soap before surgery.  CHG is an antiseptic cleaner which kills germs and bonds with the skin to continue killing germs even after washing. Please DO NOT use if you have an allergy to CHG or antibacterial soaps.  If your skin becomes reddened/irritated stop using the CHG and inform your nurse when you arrive at Short Stay. Do not shave (including legs and underarms) for at least 48 hours prior to the first CHG shower.  You may shave your face/neck. Please follow these instructions carefully:  1.  Shower with CHG Soap the night before surgery and the  morning of Surgery.  2.  If you choose to wash your hair, wash your hair first as usual with your  normal  shampoo.  3.  After you shampoo, rinse your hair and body thoroughly to remove the  shampoo.                           4.  Use CHG as you would any other liquid soap.  You can apply  chg directly  to the skin and wash                       Gently with a scrungie or clean washcloth.  5.  Apply the CHG Soap to your body ONLY FROM THE NECK DOWN.   Do not use on face/ open                           Wound or open sores. Avoid contact with  eyes, ears mouth and genitals (private parts).                       Wash face,  Genitals (private parts) with your normal soap.             6.  Wash thoroughly, paying special attention to the area where your surgery  will be performed.  7.  Thoroughly rinse your body with warm water from the neck down.  8.  DO NOT shower/wash with your normal soap after using and rinsing off  the CHG Soap.                9.  Pat yourself dry with a clean towel.            10.  Wear clean pajamas.            11.  Place clean sheets on your bed the night of your first shower and do not  sleep with pets. Day of Surgery : Do not apply any lotions/deodorants the morning of surgery.  Please wear clean clothes to the hospital/surgery center.  FAILURE TO FOLLOW THESE INSTRUCTIONS MAY RESULT IN THE CANCELLATION OF YOUR SURGERY PATIENT SIGNATURE_________________________________  NURSE SIGNATURE__________________________________  ________________________________________________________________________   [name]  An incentive spirometer is a tool that can help keep your lungs clear and active. This tool measures how well you are filling your lungs with each breath. Taking long deep breaths may help reverse or decrease the chance of developing breathing (pulmonary) problems (especially infection) following:  A long period of time when you are unable to move or be active. BEFORE THE PROCEDURE   If the spirometer includes an indicator to show your best effort, your nurse or respiratory therapist will set it to a desired goal.  If possible, sit up straight or lean slightly forward. Try not to slouch.  Hold the incentive spirometer in an upright position. INSTRUCTIONS FOR USE  1. Sit on the edge of your bed if possible, or sit up as far as you can in bed or on a chair. 2. Hold the incentive spirometer in an upright position. 3. Breathe out normally. 4. Place the mouthpiece in your mouth and seal your  lips tightly around it. 5. Breathe in slowly and as deeply as possible, raising the piston or the ball toward the top of the column. 6. Hold your breath for 3-5 seconds or for as long as possible. Allow the piston or ball to fall to the bottom of the column. 7. Remove the mouthpiece from your mouth and breathe out normally. 8. Rest for a few seconds and repeat Steps 1 through 7 at least 10 times every 1-2 hours when you are awake. Take your time and take a few normal breaths between deep breaths. 9. The spirometer may include an indicator to show your best effort. Use the indicator as a goal to work toward during each  repetition. 10. After each set of 10 deep breaths, practice coughing to be sure your lungs are clear. If you have an incision (the cut made at the time of surgery), support your incision when coughing by placing a pillow or rolled up towels firmly against it. Once you are able to get out of bed, walk around indoors and cough well. You may stop using the incentive spirometer when instructed by your caregiver.  RISKS AND COMPLICATIONS  Take your time so you do not get dizzy or light-headed.  If you are in pain, you may need to take or ask for pain medication before doing incentive spirometry. It is harder to take a deep breath if you are having pain. AFTER USE  Rest and breathe slowly and easily.  It can be helpful to keep track of a log of your progress. Your caregiver can provide you with a simple table to help with this. If you are using the spirometer at home, follow these instructions: [place] IF:   You are having difficultly using the spirometer.  You have trouble using the spirometer as often as instructed.  Your pain medication is not giving enough relief while using the spirometer.  You develop fever of 100.5 F (38.1 C) or higher. SEEK IMMEDIATE MEDICAL CARE IF:   You cough up bloody sputum that had not been present before.  You develop fever of 102 F  (38.9 C) or greater.  You develop worsening pain at or near the incision site. MAKE SURE YOU:   Understand these instructions.  Will watch your condition.  Will get help right away if you are not doing well or get worse. Document Released: 09/22/2006 Document Revised: 08/04/2011 Document Reviewed: 11/23/2006 ExitCare Patient Information 2014 ExitCare, [place].   ________________________________________________________________________  WHAT IS A BLOOD TRANSFUSION? Blood Transfusion Information  A transfusion is the replacement of blood or some of its parts. Blood is made up of multiple cells which provide different functions.  Red blood cells carry oxygen and are used for blood loss replacement.  White blood cells fight against infection.  Platelets control bleeding.  Plasma helps clot blood.  Other blood products are available for specialized needs, such as hemophilia or other clotting disorders. BEFORE THE TRANSFUSION  Who gives blood for transfusions?   Healthy volunteers who are fully evaluated to make sure their blood is safe. This is blood bank blood. Transfusion therapy is the safest it has ever been in the practice of medicine. Before blood is taken from a donor, a complete history is taken to make sure that person has no history of diseases nor engages in risky social behavior (examples are intravenous drug use or sexual activity with multiple partners). The donor's travel history is screened to minimize risk of transmitting infections, such as malaria. The donated blood is tested for signs of infectious diseases, such as HIV and hepatitis. The blood is then tested to be sure it is compatible with you in order to minimize the chance of a transfusion reaction. If you or a relative donates blood, this is often done in anticipation of surgery and is not appropriate for emergency situations. It takes many days to process the donated blood. RISKS AND COMPLICATIONS Although  transfusion therapy is very safe and saves many lives, the main dangers of transfusion include:   Getting an infectious disease.  Developing a transfusion reaction. This is an allergic reaction to something in the blood you were given. Every precaution is taken to prevent this.  The decision to have a blood transfusion has been considered carefully by your caregiver before blood is given. Blood is not given unless the benefits outweigh the risks. AFTER THE TRANSFUSION  Right after receiving a blood transfusion, you will usually feel much better and more energetic. This is especially true if your red blood cells have gotten low (anemic). The transfusion raises the level of the red blood cells which carry oxygen, and this usually causes an energy increase.  The nurse administering the transfusion will monitor you carefully for complications. HOME CARE INSTRUCTIONS  No special instructions are needed after a transfusion. You may find your energy is better. Speak with your caregiver about any limitations on activity for underlying diseases you may have. SEEK MEDICAL CARE IF:   Your condition is not improving after your transfusion.  You develop redness or irritation at the intravenous (IV) site. SEEK IMMEDIATE MEDICAL CARE IF:  Any of the following symptoms occur over the next 12 hours:  Shaking chills.  You have a temperature by mouth above 102 F (38.9 C), not controlled by medicine.  Chest, back, or muscle pain.  People around you feel you are not acting correctly or are confused.  Shortness of breath or difficulty breathing.  Dizziness and fainting.  You get a rash or develop hives.  You have a decrease in urine output.  Your urine turns a dark color or changes to pink, red, or brown. Any of the following symptoms occur over the next 10 days:  You have a temperature by mouth above 102 F (38.9 C), not controlled by medicine.  Shortness of breath.  Weakness after normal  activity.  The white part of the eye turns yellow (jaundice).  You have a decrease in the amount of urine or are urinating less often.  Your urine turns a dark color or changes to pink, red, or brown. Document Released: 05/09/2000 Document Revised: 08/04/2011 Document Reviewed: 12/27/2007 [facility] Patient Information 2014 [place], [place].  _______________________________________________________________________

## 2017-05-07 NOTE — Progress Notes (Signed)
Cbc, bmp 04-20-17 epic   Echo 04-17-16 epic

## 2017-05-07 NOTE — Progress Notes (Signed)
plaqce orders in epic. Pre-op appt on 05-08-17 . TY!

## 2017-05-08 ENCOUNTER — Other Ambulatory Visit: Payer: Self-pay

## 2017-05-08 ENCOUNTER — Encounter (HOSPITAL_COMMUNITY): Payer: Self-pay

## 2017-05-08 ENCOUNTER — Encounter (HOSPITAL_COMMUNITY)
Admission: RE | Admit: 2017-05-08 | Discharge: 2017-05-08 | Disposition: A | Discharge status: Home / Self Care | Payer: Medicare Other | Source: Ambulatory Visit | Attending: Orthopedic Surgery | Admitting: Orthopedic Surgery

## 2017-05-08 DIAGNOSIS — Z01812 Encounter for preprocedural laboratory examination: Secondary | ICD-10-CM | POA: Insufficient documentation

## 2017-05-08 DIAGNOSIS — Z01810 Encounter for preprocedural cardiovascular examination: Secondary | ICD-10-CM | POA: Diagnosis present

## 2017-05-08 DIAGNOSIS — I10 Essential (primary) hypertension: Secondary | ICD-10-CM | POA: Insufficient documentation

## 2017-05-08 LAB — CBC
HEMATOCRIT: 31.5 % — AB (ref 36.0–46.0)
HEMOGLOBIN: 9.8 g/dL — AB (ref 12.0–15.0)
MCH: 27.7 pg (ref 26.0–34.0)
MCHC: 31.1 g/dL (ref 30.0–36.0)
MCV: 89 fL (ref 78.0–100.0)
Platelets: 267 10*3/uL (ref 150–400)
RBC: 3.54 MIL/uL — AB (ref 3.87–5.11)
RDW: 14.3 % (ref 11.5–15.5)
WBC: 6.3 10*3/uL (ref 4.0–10.5)

## 2017-05-08 LAB — COMPREHENSIVE METABOLIC PANEL
ALT: 9 U/L — ABNORMAL LOW (ref 14–54)
AST: 22 U/L (ref 15–41)
Albumin: 2.6 g/dL — ABNORMAL LOW (ref 3.5–5.0)
Alkaline Phosphatase: 72 U/L (ref 38–126)
Anion gap: 7 (ref 5–15)
BUN: 14 mg/dL (ref 6–20)
CHLORIDE: 105 mmol/L (ref 101–111)
CO2: 28 mmol/L (ref 22–32)
Calcium: 9.1 mg/dL (ref 8.9–10.3)
Creatinine, Ser: 0.75 mg/dL (ref 0.44–1.00)
Glucose, Bld: 112 mg/dL — ABNORMAL HIGH (ref 65–99)
POTASSIUM: 3.9 mmol/L (ref 3.5–5.1)
SODIUM: 140 mmol/L (ref 135–145)
Total Bilirubin: 0.4 mg/dL (ref 0.3–1.2)
Total Protein: 8.1 g/dL (ref 6.5–8.1)

## 2017-05-08 NOTE — Progress Notes (Signed)
Cbc routed via epic to dr Alvan Dame

## 2017-05-09 LAB — SURGICAL PCR SCREEN
MRSA, PCR: POSITIVE — AB
STAPHYLOCOCCUS AUREUS: POSITIVE — AB

## 2017-05-10 NOTE — H&P (Signed)
[name] is an 56 y.o. female.    Chief Complaint: Infected right THA  Procedure:  Resection of right THA and placement of antibiotic spacer  HPI: Pt is a 56 y.o. female complaining of right hip pain for 3 months. Pain had continually increased since the beginning. X-rays in the clinic show previous THA of the right hip. Pt has tried various conservative treatments which have failed to alleviate their symptoms, including NSAIDs, analgesic medications, activity modification and use of assistance devices. Various options are discussed with the patient. Risks, benefits and expectations were discussed with the patient. Patient understand the risks, benefits and expectations and wishes to proceed with surgery.    PCP: System, Pcp Not In  D/C Plans:       Home / SNF  Post-op Meds:       No Rx given  Tranexamic Acid:      To be given - IV   Decadron:      Is to be given  FYI:      ASA  Oxycodone  DME:   Pt already has equipment   PT:   No PT    PMH: Past Medical History:  Diagnosis Date  . Anxiety   . Bipolar affective disorder ([place])    ADHD AND SCHZIOPHRENIC--PT GOES TO [place]. BLANKMAN  . Blood transfusion without reported diagnosis   . Cervical radiculopathy   . Chronic pain syndrome   . Complication of anesthesia    STATES SHE WOKE UP TWICE DURING SURGERIES  . DJD (degenerative joint disease)   . GERD (gastroesophageal reflux disease)   . H/O hiatal hernia   . Hepatitis C infection    STATES COMPLETED TREATMENT AND NO LONGER HAS INFECTION  . Hypertension   . Leukocytopenia   . Osteoarthritis   . Osteoporosis   . Peptic ulcer disease   . Pneumonia 2013  . Polysubstance (excluding opioids) dependence ([place])   . Seizures ([facility])    SEIZURE AFTER BREATHING TX (ALBUTEROL) IN ER; ALSO PAST HX SEIZURES DURING DRUG DETOX  . Shortness of breath    WITH EXERTION; SMOKER  . Thrombocytopenia (H[facility])     PSH: Past Surgical History:  Procedure  Laterality Date  . ABDOMINAL HYSTERECTOMY    . APPENDECTOMY    . CHOLECYSTECTOMY    . ESOPHAGOGASTRODUODENOSCOPY N/A 04/18/2016   Procedure: ESOPHAGOGASTRODUODENOSCOPY (EGD);  Surgeon: Carol Ada, MD;  Location: [facility];  Service: Endoscopy;  Laterality: N/A;  . Hyperectomy     due to obstructive ovarian cytst  . I&D EXTREMITY Right 04/14/2016   Procedure: IRRIGATION AND DEBRIDEMENT EXTREMITY;  Surgeon: Leanora Cover, MD;  Location: [facility];  Service: Orthopedics;  Laterality: Right;  . JOINT REPLACEMENT  2012   RIGHT TOTAL HIP REPLACEMENT  . Right knee arthroscopic surgery    . SPINE SURGERY     CERVIAL 6-7 SURGERY - FUSION  . TONSILECTOMY, ADENOIDECTOMY, BILATERAL MYRINGOTOMY AND TUBES    . TOTAL HIP ARTHROPLASTY Left 08/24/2012   Procedure: LEFT TOTAL HIP ARTHROPLASTY ANTERIOR APPROACH;  Surgeon: [name], MD;  Location: [facility];  Service: Orthopedics;  Laterality: Left;  . Total hip replacement      Social History:  reports that she has been smoking cigarettes.  She has a 5.00 pack-year smoking history. she has never used smokeless tobacco. She reports that she does not drink alcohol or use drugs.  Allergies:  Allergies  Allergen Reactions  . Albuterol Other (See Comments)  PT states she had seizures from an albuterol treatment and was hospitalized for it  . Aspirin Hives    Vomits blood  . Adhesive [Tape] Rash    Medications: No current facility-administered medications for this encounter.    Current Outpatient Medications  Medication Sig Dispense Refill  . acetaminophen (TYLENOL) 325 MG tablet Take 2 tablets (650 mg total) by mouth every 6 (six) hours as needed for mild pain or moderate pain. 30 tablet 0  . ALPRAZolam (XANAX) 1 MG tablet Take 1 mg by mouth 2 (two) times daily as needed for anxiety.     Marland Kitchen amphetamine-dextroamphetamine (ADDERALL) 20 MG tablet Take 20 mg by mouth 3 (three) times daily.    . cyclobenzaprine (FLEXERIL) 10 MG tablet Take 1 tablet (10  mg total) by mouth 2 (two) times daily as needed for muscle spasms. 20 tablet 0  . ibuprofen (ADVIL,MOTRIN) 200 MG tablet Take 400 mg by mouth every 6 (six) hours as needed for moderate pain.    Marland Kitchen lisinopril (PRINIVIL,ZESTRIL) 5 MG tablet Take 1 tablet (5 mg total) by mouth daily. 30 tablet 0  . oxyCODONE (OXY IR/ROXICODONE) 5 MG immediate release tablet take 1 tablet by mouth every 6 hours as needed for pain  0  . oxyCODONE-acetaminophen (PERCOCET) 5-325 MG tablet Take 1-2 tablets by mouth every 4 (four) hours as needed. (Patient not taking: Reported on 04/20/2017) 15 tablet 0  . pantoprazole (PROTONIX) 40 MG tablet Take 1 tablet (40 mg total) by mouth 2 (two) times daily before a meal. (Patient not taking: Reported on 02/03/2017) 60 tablet 0  . QUEtiapine (SEROQUEL) 200 MG tablet Take 200 mg by mouth at bedtime as needed (mood, agitation).         Review of Systems  Constitutional: Negative.   HENT: Negative.   Eyes: Negative.   Respiratory: Negative.   Cardiovascular: Negative.   Gastrointestinal: Positive for heartburn.  Genitourinary: Positive for frequency and urgency.  Musculoskeletal: Positive for joint pain.  Skin: Negative.   Neurological: Negative.   Endo/Heme/Allergies: Negative.   Psychiatric/Behavioral: The patient is nervous/anxious.        Physical Exam  Constitutional: She is oriented to person, place, and time. She appears well-developed.  HENT:  Head: Normocephalic.  Eyes: Pupils are equal, round, and reactive to light.  Neck: Neck supple. No JVD present. No tracheal deviation present. No thyromegaly present.  Cardiovascular: Normal rate, regular rhythm and intact distal pulses.  Respiratory: Effort normal and breath sounds normal. No respiratory distress. She has no wheezes.  GI: Soft. There is no tenderness. There is no guarding.  Musculoskeletal:       Right hip: She exhibits decreased range of motion, decreased strength, tenderness, bony tenderness,  swelling and laceration (healed previous incision). She exhibits no deformity.  Lymphadenopathy:    She has no cervical adenopathy.  Neurological: She is alert and oriented to person, place, and time. A sensory deficit (neuropathy in the right foot) is present.  Skin: Skin is warm and dry.  Psychiatric: She has a normal mood and affect.       Assessment/Plan Assessment:   Infected right THA  Plan: Patient will undergo a resection of right THA and placement of antibiotic spacer on 05/12/2017 per Dr. Alvan Dame at [facility]. Risks benefits and expectations were discussed with the patient. Patient understand risks, benefits and expectations and wishes to proceed.   West Pugh Shatyra Becka   PA-C  05/10/2017, 11:03 PM

## 2017-05-11 NOTE — Progress Notes (Signed)
Cbc result routed via epic to dr Adriana Mccallum

## 2017-05-12 ENCOUNTER — Inpatient Hospital Stay (HOSPITAL_COMMUNITY): Payer: Medicare Other | Admitting: Certified Registered Nurse Anesthetist

## 2017-05-12 ENCOUNTER — Encounter (HOSPITAL_COMMUNITY): Payer: Self-pay | Admitting: *Deleted

## 2017-05-12 ENCOUNTER — Encounter (HOSPITAL_COMMUNITY)
Admission: RE | Disposition: A | Discharge status: Skilled Nursing Facility | Payer: Self-pay | Source: Ambulatory Visit | Attending: Orthopedic Surgery

## 2017-05-12 ENCOUNTER — Other Ambulatory Visit: Payer: Self-pay

## 2017-05-12 ENCOUNTER — Inpatient Hospital Stay (HOSPITAL_COMMUNITY): Payer: Medicare Other

## 2017-05-12 ENCOUNTER — Inpatient Hospital Stay (HOSPITAL_COMMUNITY)
Admission: RE | Admit: 2017-05-12 | Discharge: 2017-05-22 | DRG: 467 | Disposition: A | Discharge status: Skilled Nursing Facility | Payer: Medicare Other | Source: Ambulatory Visit | Attending: Orthopedic Surgery | Admitting: Orthopedic Surgery

## 2017-05-12 DIAGNOSIS — Z886 Allergy status to analgesic agent status: Secondary | ICD-10-CM | POA: Diagnosis not present

## 2017-05-12 DIAGNOSIS — Z981 Arthrodesis status: Secondary | ICD-10-CM

## 2017-05-12 DIAGNOSIS — R52 Pain, unspecified: Secondary | ICD-10-CM

## 2017-05-12 DIAGNOSIS — Z8701 Personal history of pneumonia (recurrent): Secondary | ICD-10-CM

## 2017-05-12 DIAGNOSIS — Z90710 Acquired absence of both cervix and uterus: Secondary | ICD-10-CM

## 2017-05-12 DIAGNOSIS — Z79899 Other long term (current) drug therapy: Secondary | ICD-10-CM

## 2017-05-12 DIAGNOSIS — Z96649 Presence of unspecified artificial hip joint: Secondary | ICD-10-CM

## 2017-05-12 DIAGNOSIS — I10 Essential (primary) hypertension: Secondary | ICD-10-CM | POA: Diagnosis present

## 2017-05-12 DIAGNOSIS — G894 Chronic pain syndrome: Secondary | ICD-10-CM | POA: Diagnosis present

## 2017-05-12 DIAGNOSIS — Y831 Surgical operation with implant of artificial internal device as the cause of abnormal reaction of the patient, or of later complication, without mention of misadventure at the time of the procedure: Secondary | ICD-10-CM | POA: Diagnosis present

## 2017-05-12 DIAGNOSIS — M25551 Pain in right hip: Secondary | ICD-10-CM | POA: Diagnosis present

## 2017-05-12 DIAGNOSIS — M810 Age-related osteoporosis without current pathological fracture: Secondary | ICD-10-CM | POA: Diagnosis present

## 2017-05-12 DIAGNOSIS — B182 Chronic viral hepatitis C: Secondary | ICD-10-CM | POA: Diagnosis present

## 2017-05-12 DIAGNOSIS — T8451XA Infection and inflammatory reaction due to internal right hip prosthesis, initial encounter: Secondary | ICD-10-CM | POA: Diagnosis present

## 2017-05-12 DIAGNOSIS — Z91048 Other nonmedicinal substance allergy status: Secondary | ICD-10-CM | POA: Diagnosis not present

## 2017-05-12 DIAGNOSIS — A4901 Methicillin susceptible Staphylococcus aureus infection, unspecified site: Secondary | ICD-10-CM | POA: Diagnosis present

## 2017-05-12 DIAGNOSIS — Z89629 Acquired absence of unspecified hip joint: Secondary | ICD-10-CM

## 2017-05-12 DIAGNOSIS — Z8711 Personal history of peptic ulcer disease: Secondary | ICD-10-CM

## 2017-05-12 DIAGNOSIS — F419 Anxiety disorder, unspecified: Secondary | ICD-10-CM | POA: Diagnosis present

## 2017-05-12 DIAGNOSIS — F17210 Nicotine dependence, cigarettes, uncomplicated: Secondary | ICD-10-CM | POA: Diagnosis present

## 2017-05-12 DIAGNOSIS — F1990 Other psychoactive substance use, unspecified, uncomplicated: Secondary | ICD-10-CM | POA: Diagnosis not present

## 2017-05-12 DIAGNOSIS — Z7901 Long term (current) use of anticoagulants: Secondary | ICD-10-CM | POA: Diagnosis not present

## 2017-05-12 DIAGNOSIS — K219 Gastro-esophageal reflux disease without esophagitis: Secondary | ICD-10-CM | POA: Diagnosis present

## 2017-05-12 DIAGNOSIS — F319 Bipolar disorder, unspecified: Secondary | ICD-10-CM | POA: Diagnosis present

## 2017-05-12 DIAGNOSIS — D62 Acute posthemorrhagic anemia: Secondary | ICD-10-CM | POA: Diagnosis not present

## 2017-05-12 DIAGNOSIS — Z96643 Presence of artificial hip joint, bilateral: Secondary | ICD-10-CM | POA: Diagnosis present

## 2017-05-12 DIAGNOSIS — B9561 Methicillin susceptible Staphylococcus aureus infection as the cause of diseases classified elsewhere: Secondary | ICD-10-CM | POA: Diagnosis not present

## 2017-05-12 DIAGNOSIS — F909 Attention-deficit hyperactivity disorder, unspecified type: Secondary | ICD-10-CM | POA: Diagnosis present

## 2017-05-12 DIAGNOSIS — Z8614 Personal history of Methicillin resistant Staphylococcus aureus infection: Secondary | ICD-10-CM | POA: Diagnosis not present

## 2017-05-12 DIAGNOSIS — T8450XA Infection and inflammatory reaction due to unspecified internal joint prosthesis, initial encounter: Secondary | ICD-10-CM | POA: Diagnosis not present

## 2017-05-12 DIAGNOSIS — Z888 Allergy status to other drugs, medicaments and biological substances status: Secondary | ICD-10-CM | POA: Diagnosis not present

## 2017-05-12 HISTORY — PX: ANTERIOR HIP REVISION: SHX6527

## 2017-05-12 LAB — PREPARE RBC (CROSSMATCH)

## 2017-05-12 SURGERY — REVISION, TOTAL ARTHROPLASTY, HIP, ANTERIOR APPROACH
Anesthesia: General | Site: Hip | Laterality: Right

## 2017-05-12 MED ORDER — BISACODYL 10 MG RE SUPP
10.0000 mg | Freq: Every day | RECTAL | Status: DC | PRN
  Filled 2017-05-12: qty 1

## 2017-05-12 MED ORDER — ROCURONIUM BROMIDE 10 MG/ML (PF) SYRINGE
PREFILLED_SYRINGE | INTRAVENOUS | Status: DC | PRN
  Administered 2017-05-12 (×2): 30 mg via INTRAVENOUS

## 2017-05-12 MED ORDER — MIDAZOLAM HCL 2 MG/2ML IJ SOLN
INTRAMUSCULAR | Status: AC
  Filled 2017-05-12: qty 2

## 2017-05-12 MED ORDER — ACETAMINOPHEN 650 MG RE SUPP: 650.0000 mg | RECTAL | Status: DC | PRN

## 2017-05-12 MED ORDER — CHLORHEXIDINE GLUCONATE 4 % EX LIQD: 60.0000 mL | Freq: Once | CUTANEOUS | Status: DC

## 2017-05-12 MED ORDER — MEPERIDINE HCL 50 MG/ML IJ SOLN: 6.2500 mg | INTRAMUSCULAR | Status: DC | PRN

## 2017-05-12 MED ORDER — SUFENTANIL CITRATE 50 MCG/ML IV SOLN
INTRAVENOUS | Status: DC | PRN
  Administered 2017-05-12: 20 ug via INTRAVENOUS
  Administered 2017-05-12 (×3): 10 ug via INTRAVENOUS

## 2017-05-12 MED ORDER — VANCOMYCIN HCL 1000 MG IV SOLR
INTRAVENOUS | Status: DC | PRN
  Administered 2017-05-12: 2000 mg
  Administered 2017-05-12: 1000 mg

## 2017-05-12 MED ORDER — 0.9 % SODIUM CHLORIDE (POUR BTL) OPTIME
TOPICAL | Status: DC | PRN
  Administered 2017-05-12: 1000 mL

## 2017-05-12 MED ORDER — CEFAZOLIN SODIUM-DEXTROSE 2-4 GM/100ML-% IV SOLN
INTRAVENOUS | Status: AC
  Filled 2017-05-12: qty 100

## 2017-05-12 MED ORDER — DEXAMETHASONE SODIUM PHOSPHATE 10 MG/ML IJ SOLN
10.0000 mg | Freq: Once | INTRAMUSCULAR | Status: AC
  Administered 2017-05-13: 10 mg via INTRAVENOUS
  Filled 2017-05-12: qty 1

## 2017-05-12 MED ORDER — LACTATED RINGERS IV SOLN
INTRAVENOUS | Status: DC
  Administered 2017-05-12: 15:00:00 via INTRAVENOUS

## 2017-05-12 MED ORDER — PROPOFOL 10 MG/ML IV BOLUS
INTRAVENOUS | Status: AC
  Filled 2017-05-12: qty 20

## 2017-05-12 MED ORDER — HYDROMORPHONE HCL 2 MG/ML IJ SOLN
INTRAMUSCULAR | Status: AC
  Filled 2017-05-12: qty 1

## 2017-05-12 MED ORDER — METOCLOPRAMIDE HCL 5 MG/ML IJ SOLN
5.0000 mg | Freq: Three times a day (TID) | INTRAMUSCULAR | Status: DC | PRN
  Administered 2017-05-21: 10 mg via INTRAVENOUS
  Filled 2017-05-12: qty 2

## 2017-05-12 MED ORDER — PROMETHAZINE HCL 25 MG/ML IJ SOLN: 6.2500 mg | INTRAMUSCULAR | Status: DC | PRN

## 2017-05-12 MED ORDER — ONDANSETRON HCL 4 MG/2ML IJ SOLN
INTRAMUSCULAR | Status: DC | PRN
  Administered 2017-05-12: 4 mg via INTRAVENOUS

## 2017-05-12 MED ORDER — MENTHOL 3 MG MT LOZG: 1.0000 | LOZENGE | OROMUCOSAL | Status: DC | PRN

## 2017-05-12 MED ORDER — AMPHETAMINE-DEXTROAMPHETAMINE 10 MG PO TABS
20.0000 mg | ORAL_TABLET | Freq: Three times a day (TID) | ORAL | Status: DC
  Administered 2017-05-18 – 2017-05-22 (×12): 20 mg via ORAL
  Filled 2017-05-12 (×3): qty 1
  Filled 2017-05-12 (×4): qty 2
  Filled 2017-05-12 (×3): qty 1
  Filled 2017-05-12 (×2): qty 2
  Filled 2017-05-12: qty 1
  Filled 2017-05-12 (×3): qty 2
  Filled 2017-05-12: qty 1
  Filled 2017-05-12: qty 2

## 2017-05-12 MED ORDER — SUGAMMADEX SODIUM 200 MG/2ML IV SOLN
INTRAVENOUS | Status: DC | PRN
  Administered 2017-05-12: 150 mg via INTRAVENOUS

## 2017-05-12 MED ORDER — SUFENTANIL CITRATE 50 MCG/ML IV SOLN
INTRAVENOUS | Status: AC
  Filled 2017-05-12: qty 1

## 2017-05-12 MED ORDER — FENTANYL CITRATE (PF) 100 MCG/2ML IJ SOLN
INTRAMUSCULAR | Status: AC
  Administered 2017-05-12: 100 ug via INTRAVENOUS
  Filled 2017-05-12: qty 2

## 2017-05-12 MED ORDER — KETAMINE HCL 10 MG/ML IJ SOLN
INTRAMUSCULAR | Status: DC | PRN
  Administered 2017-05-12: 30 mg via INTRAVENOUS
  Administered 2017-05-12 (×2): 20 mg via INTRAVENOUS

## 2017-05-12 MED ORDER — HYDROMORPHONE HCL 1 MG/ML IJ SOLN
0.5000 mg | INTRAMUSCULAR | Status: DC | PRN
  Administered 2017-05-13 (×2): 1 mg via INTRAVENOUS
  Filled 2017-05-12 (×2): qty 1

## 2017-05-12 MED ORDER — ACETAMINOPHEN 325 MG PO TABS: 650.0000 mg | ORAL_TABLET | ORAL | Status: DC | PRN

## 2017-05-12 MED ORDER — VANCOMYCIN HCL IN DEXTROSE 1-5 GM/200ML-% IV SOLN
1000.0000 mg | Freq: Once | INTRAVENOUS | Status: AC
  Administered 2017-05-12: 1000 mg via INTRAVENOUS

## 2017-05-12 MED ORDER — SUCCINYLCHOLINE CHLORIDE 200 MG/10ML IV SOSY
PREFILLED_SYRINGE | INTRAVENOUS | Status: AC
  Filled 2017-05-12: qty 10

## 2017-05-12 MED ORDER — DIPHENHYDRAMINE HCL 12.5 MG/5ML PO ELIX
12.5000 mg | ORAL_SOLUTION | ORAL | Status: DC | PRN
  Administered 2017-05-14 – 2017-05-19 (×2): 25 mg via ORAL
  Administered 2017-05-19: 12.5 mg via ORAL
  Administered 2017-05-20 – 2017-05-21 (×2): 25 mg via ORAL
  Filled 2017-05-12 (×6): qty 10

## 2017-05-12 MED ORDER — SODIUM CHLORIDE 0.9 % IV SOLN
Freq: Once | INTRAVENOUS | Status: AC
Start: 2017-05-12 — End: 2017-05-21
  Administered 2017-05-21: 10:00:00 via INTRAVENOUS

## 2017-05-12 MED ORDER — ONDANSETRON HCL 4 MG/2ML IJ SOLN
INTRAMUSCULAR | Status: AC
  Filled 2017-05-12: qty 2

## 2017-05-12 MED ORDER — DOCUSATE SODIUM 100 MG PO CAPS
100.0000 mg | ORAL_CAPSULE | Freq: Two times a day (BID) | ORAL | Status: DC
  Administered 2017-05-12 – 2017-05-22 (×19): 100 mg via ORAL
  Filled 2017-05-12 (×19): qty 1

## 2017-05-12 MED ORDER — ONDANSETRON HCL 4 MG/2ML IJ SOLN
4.0000 mg | Freq: Four times a day (QID) | INTRAMUSCULAR | Status: DC | PRN
  Administered 2017-05-19 – 2017-05-22 (×7): 4 mg via INTRAVENOUS
  Filled 2017-05-12 (×7): qty 2

## 2017-05-12 MED ORDER — SUGAMMADEX SODIUM 200 MG/2ML IV SOLN
INTRAVENOUS | Status: AC
  Filled 2017-05-12: qty 2

## 2017-05-12 MED ORDER — TRANEXAMIC ACID 1000 MG/10ML IV SOLN
1000.0000 mg | INTRAVENOUS | Status: AC
  Administered 2017-05-12: 1000 mg via INTRAVENOUS
  Filled 2017-05-12: qty 1100

## 2017-05-12 MED ORDER — SODIUM CHLORIDE 0.9 % IV SOLN
1000.0000 mg | Freq: Once | INTRAVENOUS | Status: AC
  Administered 2017-05-12: 1000 mg via INTRAVENOUS
  Filled 2017-05-12: qty 1100

## 2017-05-12 MED ORDER — LIDOCAINE 2% (20 MG/ML) 5 ML SYRINGE
INTRAMUSCULAR | Status: AC
  Filled 2017-05-12: qty 5

## 2017-05-12 MED ORDER — ACETAMINOPHEN 10 MG/ML IV SOLN
1000.0000 mg | Freq: Once | INTRAVENOUS | Status: DC | PRN
  Administered 2017-05-12: 1000 mg via INTRAVENOUS

## 2017-05-12 MED ORDER — LIDOCAINE 2% (20 MG/ML) 5 ML SYRINGE
INTRAMUSCULAR | Status: DC | PRN
  Administered 2017-05-12: 100 mg via INTRAVENOUS

## 2017-05-12 MED ORDER — VANCOMYCIN HCL IN DEXTROSE 750-5 MG/150ML-% IV SOLN
750.0000 mg | Freq: Two times a day (BID) | INTRAVENOUS | Status: DC
  Administered 2017-05-12 – 2017-05-15 (×5): 750 mg via INTRAVENOUS
  Filled 2017-05-12 (×6): qty 150

## 2017-05-12 MED ORDER — MIDAZOLAM HCL 2 MG/2ML IJ SOLN
INTRAMUSCULAR | Status: DC | PRN
  Administered 2017-05-12: 2 mg via INTRAVENOUS

## 2017-05-12 MED ORDER — SODIUM CHLORIDE 0.9 % IV SOLN
INTRAVENOUS | Status: DC
  Administered 2017-05-12: 20:00:00 via INTRAVENOUS

## 2017-05-12 MED ORDER — ALBUMIN HUMAN 5 % IV SOLN
INTRAVENOUS | Status: AC
Start: 2017-05-12 — End: 2017-05-12
  Filled 2017-05-12: qty 250

## 2017-05-12 MED ORDER — TOBRAMYCIN SULFATE 1.2 G IJ SOLR
INTRAMUSCULAR | Status: DC | PRN
  Administered 2017-05-12: 2.4 g

## 2017-05-12 MED ORDER — ALUM & MAG HYDROXIDE-SIMETH 200-200-20 MG/5ML PO SUSP
15.0000 mL | ORAL | Status: DC | PRN
  Administered 2017-05-13 – 2017-05-15 (×3): 15 mL via ORAL
  Filled 2017-05-12 (×4): qty 30

## 2017-05-12 MED ORDER — DEXAMETHASONE SODIUM PHOSPHATE 10 MG/ML IJ SOLN
10.0000 mg | Freq: Once | INTRAMUSCULAR | Status: AC
  Administered 2017-05-12: 10 mg via INTRAVENOUS

## 2017-05-12 MED ORDER — PHENYLEPHRINE 40 MCG/ML (10ML) SYRINGE FOR IV PUSH (FOR BLOOD PRESSURE SUPPORT)
PREFILLED_SYRINGE | INTRAVENOUS | Status: DC | PRN
  Administered 2017-05-12 (×4): 80 ug via INTRAVENOUS

## 2017-05-12 MED ORDER — STERILE WATER FOR IRRIGATION IR SOLN
Status: DC | PRN
  Administered 2017-05-12: 2000 mL

## 2017-05-12 MED ORDER — FERROUS SULFATE 325 (65 FE) MG PO TABS
325.0000 mg | ORAL_TABLET | Freq: Three times a day (TID) | ORAL | Status: DC
  Administered 2017-05-13 – 2017-05-20 (×10): 325 mg via ORAL
  Filled 2017-05-12 (×18): qty 1

## 2017-05-12 MED ORDER — RIVAROXABAN 10 MG PO TABS
10.0000 mg | ORAL_TABLET | ORAL | Status: DC
  Administered 2017-05-13 – 2017-05-22 (×10): 10 mg via ORAL
  Filled 2017-05-12 (×10): qty 1

## 2017-05-12 MED ORDER — METHOCARBAMOL 1000 MG/10ML IJ SOLN
500.0000 mg | Freq: Four times a day (QID) | INTRAVENOUS | Status: DC | PRN
  Administered 2017-05-12: 500 mg via INTRAVENOUS
  Filled 2017-05-12: qty 550

## 2017-05-12 MED ORDER — FENTANYL CITRATE (PF) 100 MCG/2ML IJ SOLN
50.0000 ug | INTRAMUSCULAR | Status: DC
  Administered 2017-05-12: 100 ug via INTRAVENOUS

## 2017-05-12 MED ORDER — POLYETHYLENE GLYCOL 3350 17 G PO PACK
17.0000 g | PACK | Freq: Two times a day (BID) | ORAL | Status: DC
  Administered 2017-05-12 – 2017-05-21 (×12): 17 g via ORAL
  Filled 2017-05-12 (×15): qty 1

## 2017-05-12 MED ORDER — ALBUMIN HUMAN 5 % IV SOLN
INTRAVENOUS | Status: DC | PRN
  Administered 2017-05-12: 17:00:00 via INTRAVENOUS

## 2017-05-12 MED ORDER — VANCOMYCIN HCL 1000 MG IV SOLR
INTRAVENOUS | Status: AC
  Filled 2017-05-12: qty 3000

## 2017-05-12 MED ORDER — KETAMINE HCL 10 MG/ML IJ SOLN
INTRAMUSCULAR | Status: AC
  Filled 2017-05-12: qty 1

## 2017-05-12 MED ORDER — HYDROMORPHONE HCL 1 MG/ML IJ SOLN
INTRAMUSCULAR | Status: AC
  Filled 2017-05-12: qty 2

## 2017-05-12 MED ORDER — SODIUM CHLORIDE 0.9 % IR SOLN
Status: DC | PRN
  Administered 2017-05-12: 3000 mL
  Administered 2017-05-12: 6000 mL

## 2017-05-12 MED ORDER — QUETIAPINE FUMARATE 100 MG PO TABS: 200.0000 mg | ORAL_TABLET | Freq: Every evening | ORAL | Status: DC | PRN

## 2017-05-12 MED ORDER — MAGNESIUM CITRATE PO SOLN
1.0000 | Freq: Once | ORAL | Status: AC | PRN
  Administered 2017-05-17: 15:00:00 1 via ORAL
  Filled 2017-05-12: qty 296

## 2017-05-12 MED ORDER — TOBRAMYCIN SULFATE 1.2 G IJ SOLR
INTRAMUSCULAR | Status: AC
  Filled 2017-05-12: qty 3.6

## 2017-05-12 MED ORDER — HYDROMORPHONE HCL 1 MG/ML IJ SOLN
0.2500 mg | INTRAMUSCULAR | Status: DC | PRN
  Administered 2017-05-12 (×2): 0.5 mg via INTRAVENOUS

## 2017-05-12 MED ORDER — PHENOL 1.4 % MT LIQD: 1.0000 | OROMUCOSAL | Status: DC | PRN

## 2017-05-12 MED ORDER — SODIUM CHLORIDE 0.9 % IV SOLN
INTRAVENOUS | Status: DC | PRN
  Administered 2017-05-12: 17:00:00 via INTRAVENOUS

## 2017-05-12 MED ORDER — METHOCARBAMOL 500 MG PO TABS
500.0000 mg | ORAL_TABLET | Freq: Four times a day (QID) | ORAL | Status: DC | PRN
  Administered 2017-05-13 – 2017-05-21 (×13): 500 mg via ORAL
  Filled 2017-05-12 (×13): qty 1

## 2017-05-12 MED ORDER — ALPRAZOLAM 1 MG PO TABS
1.0000 mg | ORAL_TABLET | Freq: Two times a day (BID) | ORAL | Status: DC | PRN
  Administered 2017-05-13 – 2017-05-21 (×10): 1 mg via ORAL
  Filled 2017-05-12 (×10): qty 1

## 2017-05-12 MED ORDER — LACTATED RINGERS IV SOLN
INTRAVENOUS | Status: DC | PRN
  Administered 2017-05-12: 16:00:00 via INTRAVENOUS

## 2017-05-12 MED ORDER — ACETAMINOPHEN 10 MG/ML IV SOLN
INTRAVENOUS | Status: AC
  Filled 2017-05-12: qty 100

## 2017-05-12 MED ORDER — HYDROMORPHONE HCL 1 MG/ML IJ SOLN
INTRAMUSCULAR | Status: DC | PRN
  Administered 2017-05-12 (×3): .4 mg via INTRAVENOUS

## 2017-05-12 MED ORDER — OXYCODONE HCL 5 MG PO TABS: 5.0000 mg | ORAL_TABLET | ORAL | Status: DC | PRN

## 2017-05-12 MED ORDER — OXYCODONE HCL 5 MG PO TABS
10.0000 mg | ORAL_TABLET | ORAL | Status: DC | PRN
  Administered 2017-05-12 – 2017-05-14 (×10): 10 mg via ORAL
  Filled 2017-05-12 (×11): qty 2

## 2017-05-12 MED ORDER — PROPOFOL 10 MG/ML IV BOLUS
INTRAVENOUS | Status: DC | PRN
  Administered 2017-05-12: 150 mg via INTRAVENOUS

## 2017-05-12 MED ORDER — CEFAZOLIN SODIUM-DEXTROSE 2-4 GM/100ML-% IV SOLN
2.0000 g | Freq: Once | INTRAVENOUS | Status: AC
  Administered 2017-05-12: 2 g via INTRAVENOUS

## 2017-05-12 MED ORDER — SODIUM CHLORIDE 0.9 % IJ SOLN
INTRAMUSCULAR | Status: AC
Start: 2017-05-12 — End: 2017-05-12
  Filled 2017-05-12: qty 20

## 2017-05-12 MED ORDER — METOCLOPRAMIDE HCL 5 MG PO TABS
5.0000 mg | ORAL_TABLET | Freq: Three times a day (TID) | ORAL | Status: DC | PRN
  Administered 2017-05-20 – 2017-05-22 (×2): 10 mg via ORAL
  Filled 2017-05-12 (×2): qty 2

## 2017-05-12 MED ORDER — VANCOMYCIN HCL IN DEXTROSE 1-5 GM/200ML-% IV SOLN
INTRAVENOUS | Status: AC
  Filled 2017-05-12: qty 200

## 2017-05-12 MED ORDER — ONDANSETRON HCL 4 MG PO TABS
4.0000 mg | ORAL_TABLET | Freq: Four times a day (QID) | ORAL | Status: DC | PRN
  Administered 2017-05-20: 4 mg via ORAL
  Filled 2017-05-12 (×2): qty 1

## 2017-05-12 MED ORDER — DEXAMETHASONE SODIUM PHOSPHATE 10 MG/ML IJ SOLN
INTRAMUSCULAR | Status: AC
  Filled 2017-05-12: qty 1

## 2017-05-12 SURGICAL SUPPLY — 42 items
ADH SKN CLS APL DERMABOND .7 (GAUZE/BANDAGES/DRESSINGS) ×1
BAG SPEC THK2 15X12 ZIP CLS (MISCELLANEOUS) ×1
BAG ZIPLOCK 12X15 (MISCELLANEOUS) ×3 IMPLANT
BALL HIP CERAMIC (Hips) ×1 IMPLANT
BLADE SAW SGTL 11.0X1.19X90.0M (BLADE) IMPLANT
BNDG COHESIVE 6X5 TAN STRL LF (GAUZE/BANDAGES/DRESSINGS) ×3 IMPLANT
CEMENT HV SMART SET (Cement) ×3 IMPLANT
CLOTH BEACON ORANGE TIMEOUT ST (SAFETY) ×3 IMPLANT
COVER SURGICAL LIGHT HANDLE (MISCELLANEOUS) ×3 IMPLANT
DERMABOND ADVANCED (GAUZE/BANDAGES/DRESSINGS) ×2
DERMABOND ADVANCED .7 DNX12 (GAUZE/BANDAGES/DRESSINGS) ×1 IMPLANT
DRAPE ORTHO SPLIT 87X125 STRL (DRAPES) ×6 IMPLANT
DRAPE STERI IOBAN 125X83 (DRAPES) ×3 IMPLANT
DRAPE U-SHAPE 47X51 STRL (DRAPES) ×6 IMPLANT
DRSG AQUACEL AG ADV 3.5X10 (GAUZE/BANDAGES/DRESSINGS) ×3 IMPLANT
DRSG MEPILEX BORDER 4X8 (GAUZE/BANDAGES/DRESSINGS) ×3 IMPLANT
DURAPREP 26ML APPLICATOR (WOUND CARE) ×3 IMPLANT
ELECT REM PT RETURN 15FT ADLT (MISCELLANEOUS) ×3 IMPLANT
GLOVE BIOGEL PI IND STRL 7.5 (GLOVE) ×6 IMPLANT
GLOVE BIOGEL PI IND STRL 8.5 (GLOVE) ×1 IMPLANT
GLOVE BIOGEL PI INDICATOR 7.5 (GLOVE) ×12
GLOVE BIOGEL PI INDICATOR 8.5 (GLOVE) ×2
GLOVE ECLIPSE 8.0 STRL XLNG CF (GLOVE) ×6 IMPLANT
GLOVE ORTHO TXT STRL SZ7.5 (GLOVE) ×6 IMPLANT
GLOVE SURG SS PI 7.5 STRL IVOR (GLOVE) ×3 IMPLANT
GOWN STRL REUS W/ TWL XL LVL3 (GOWN DISPOSABLE) ×2 IMPLANT
GOWN STRL REUS W/TWL LRG LVL3 (GOWN DISPOSABLE) ×9 IMPLANT
GOWN STRL REUS W/TWL XL LVL3 (GOWN DISPOSABLE) ×12 IMPLANT
HIP BALL CERAMIC (Hips) ×3 IMPLANT
HOLDER FOLEY CATH W/STRAP (MISCELLANEOUS) ×3 IMPLANT
LINER ACET CUP 42MMX32MM (Hips) ×3 IMPLANT
PACK TOTAL JOINT (CUSTOM PROCEDURE TRAY) ×3 IMPLANT
SAW OSC TIP CART 19.5X105X1.3 (SAW) IMPLANT
STAPLER VISISTAT (STAPLE) ×3 IMPLANT
SUT MNCRL AB 4-0 PS2 18 (SUTURE) ×3 IMPLANT
SUT PDS AB 1 CT1 27 (SUTURE) ×6 IMPLANT
SUT VIC AB 1 CT1 36 (SUTURE) ×9 IMPLANT
SUT VIC AB 2-0 CT1 27 (SUTURE) ×6
SUT VIC AB 2-0 CT1 TAPERPNT 27 (SUTURE) ×2 IMPLANT
SUT VLOC 180 0 24IN GS25 (SUTURE) ×3 IMPLANT
TOWEL OR NON WOVEN STRL DISP B (DISPOSABLE) ×6 IMPLANT
TRAY FOLEY CATH SILVER 14FR (SET/KITS/TRAYS/PACK) ×3 IMPLANT

## 2017-05-12 NOTE — Brief Op Note (Signed)
05/12/2017  3:33 PM  PATIENT:  [name]  56 y.o. 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  PROCEDURE:  Procedure(s) with comments: Right total hip resection arthroplasty placement of antibiotic acetabular liner and femoral head preserving the femoral stem  SURGEON:  Surgeon(s) and Role:    Paralee Cancel, MD - Primary  PHYSICIAN ASSISTANT: Danae Orleans, PA-C  ANESTHESIA:   general  EBL:  1200 cc   BLOOD ADMINISTERED  2 units of PRBCs  DRAINS: none   LOCAL MEDICATIONS USED:  NONE  SPECIMEN:  Source of Specimen:  right hip joint fluid  DISPOSITION OF SPECIMEN:  PATHOLOGY  COUNTS:  YES  TOURNIQUET:  * No tourniquets in log *  DICTATION: .Other Dictation: Dictation Number [id number]  PLAN OF CARE: Admit to inpatient   PATIENT DISPOSITION:  PACU - hemodynamically stable.   Delay start of Pharmacological VTE agent (>24hrs) due to surgical blood loss or risk of bleeding: no

## 2017-05-12 NOTE — Progress Notes (Signed)
Pharmacy Antibiotic Note  [name] is a 56 y.o. female admitted on 05/12/2017 for resection of right THA and placement of antibiotic spacer.  Pharmacy has been consulted for vancomycin dosing.  Plan: Received 1g IV vancomycin at 16:32 Begin vancomycin 750mg  IV q12h at midnight.  Estimated AUC is 477, goal 400-500. Check vancomycin peak and trough at steady state if tx continues Follow up renal function & cultures  Height: 5\' 7"  (170.2 cm) Weight: 148 lb (67.1 kg) IBW/kg (Calculated) : 61.6  Temp (24hrs), Avg:98.1 F (36.7 C), Min:97.3 F (36.3 C), Max:98.6 F (37 C)  Recent Labs  Lab 05/08/17 1527  WBC 6.3  CREATININE 0.75    Estimated Creatinine Clearance: 76.4 mL/min (by C-G formula based on SCr of 0.75 mg/dL).    Allergies  Allergen Reactions  . Albuterol Other (See Comments)    PT states she had seizures from an albuterol treatment and was hospitalized for it  . Aspirin Hives    Vomits blood  . Adhesive [Tape] Rash    Antimicrobials this admission: 12/18 Cefazolin x 1 12/18 Vancomycin >>  Dose adjustments this admission:   Microbiology results: 12/18 Synovium fluid: rare GPC in pairs  Thank you for allowing pharmacy to be a part of this patient's care.  Peggyann Juba, PharmD, BCPS Pager: [phone] 05/12/2017 7:53 PM

## 2017-05-12 NOTE — Discharge Instructions (Signed)

## 2017-05-12 NOTE — Anesthesia Procedure Notes (Signed)
Procedure Name: Intubation Date/Time: 05/12/2017 3:43 PM Performed by: Sharlette Dense, CRNA Patient Re-evaluated:Patient Re-evaluated prior to induction Oxygen Delivery Method: Circle system utilized Preoxygenation: Pre-oxygenation with 100% oxygen Induction Type: IV induction Ventilation: Mask ventilation without difficulty Laryngoscope Size: Miller and 2 Grade View: Grade I Tube type: Oral Tube size: 7.5 mm Number of attempts: 1 Airway Equipment and Method: Stylet Placement Confirmation: ETT inserted through vocal cords under direct vision,  positive ETCO2 and breath sounds checked- equal and bilateral Secured at: 21 cm Tube secured with: Tape Dental Injury: Teeth and Oropharynx as per pre-operative assessment

## 2017-05-12 NOTE — Anesthesia Preprocedure Evaluation (Signed)
Anesthesia Evaluation  Patient identified by MRN, date of birth, ID band Patient awake    Reviewed: Allergy & Precautions, NPO status , Patient's Chart, lab work & pertinent test results  Airway Mallampati: I       Dental  (+) Edentulous Upper, Edentulous Lower   Pulmonary Current Smoker,    Pulmonary exam normal breath sounds clear to auscultation       Cardiovascular hypertension, Pt. on medications Normal cardiovascular exam Rhythm:Regular Rate:Normal     Neuro/Psych PSYCHIATRIC DISORDERS Anxiety Bipolar Disorder Schizophrenia    GI/Hepatic GERD  ,  Endo/Other  negative endocrine ROS  Renal/GU      Musculoskeletal   Abdominal Normal abdominal exam  (+)   Peds  Hematology  (+) anemia ,   Anesthesia Other Findings   Reproductive/Obstetrics                             Anesthesia Physical Anesthesia Plan  ASA: II  Anesthesia Plan: General   Post-op Pain Management:    Induction: Intravenous  PONV Risk Score and Plan: 3 and Ondansetron, Dexamethasone and Midazolam  Airway Management Planned: Oral ETT  Additional Equipment:   Intra-op Plan:   Post-operative Plan: Extubation in OR  Informed Consent: I have reviewed the patients History and Physical, chart, labs and discussed the procedure including the risks, benefits and alternatives for the proposed anesthesia with the patient or authorized representative who has indicated his/her understanding and acceptance.   Dental advisory given  Plan Discussed with: CRNA and Surgeon  Anesthesia Plan Comments:         Anesthesia Quick Evaluation

## 2017-05-12 NOTE — Interval H&P Note (Signed)
History and Physical Interval Note:  05/12/2017 2:20 PM  [name]  has presented today for surgery, with the diagnosis of Failed Right total hip arthroplasty  The various methods of treatment have been discussed with the patient and family. After consideration of risks, benefits and other options for treatment, the patient has consented to  Procedure(s) with comments: Right total hip resection versus revision (Right) - 2 hrs as a surgical intervention .  The patient's history has been reviewed, patient examined, no change in status, stable for surgery.  I have reviewed the patient's chart and labs.  Questions were answered to the patient's satisfaction.     [name]

## 2017-05-12 NOTE — Anesthesia Postprocedure Evaluation (Signed)
Anesthesia Post Note  Patient: [name]  Procedure(s) Performed: Right total hip resection WITH ANTIBIOTIC SPACER (Right Hip)     Patient location during evaluation: PACU Anesthesia Type: General Level of consciousness: awake and sedated Pain management: pain level controlled Vital Signs Assessment: post-procedure vital signs reviewed and stable Respiratory status: spontaneous breathing Cardiovascular status: stable Postop Assessment: no apparent nausea or vomiting Anesthetic complications: no    Last Vitals:  Vitals:   05/12/17 1900 05/12/17 1915  BP: 136/81 137/84  Pulse: 86 81  Resp: (!) 9 11  Temp:    SpO2: 99% 97%    Last Pain:  Vitals:   05/12/17 1915  TempSrc:   PainSc: Asleep   Pain Goal:    LLE Motor Response: Responds to commands;Purposeful movement (05/12/17 1915) LLE Sensation: Full sensation (05/12/17 1915) RLE Motor Response: Responds to commands;Purposeful movement (05/12/17 1915) RLE Sensation: Pain;Full sensation;Tingling (05/12/17 1915) L Sensory Level: S1-Sole of foot, small toes (05/12/17 1915) R Sensory Level: S1-Sole of foot, small toes (05/12/17 1915)  Charlise Giovanetti JR,JOHN Mateo Flow

## 2017-05-12 NOTE — Transfer of Care (Signed)
Immediate Anesthesia Transfer of Care Note  Patient: [name]  Procedure(s) Performed: Right total hip resection WITH ANTIBIOTIC SPACER (Right Hip)  Patient Location: PACU  Anesthesia Type:General  Level of Consciousness: awake and alert   Airway & Oxygen Therapy: Patient Spontanous Breathing and Patient connected to nasal cannula oxygen  Post-op Assessment: Report given to RN and Post -op Vital signs reviewed and stable  Post vital signs: Reviewed and stable  Last Vitals:  Vitals:   05/12/17 1813 05/12/17 1815  BP: (!) 150/127 (!) 190/95  Pulse: 94 96  Resp: (!) 23 11  Temp: 37 C   SpO2: 100% 99%    Last Pain:  Vitals:   05/12/17 1813  TempSrc:   PainSc: (P) 10-Worst pain ever         Complications: No apparent anesthesia complications

## 2017-05-13 ENCOUNTER — Encounter (HOSPITAL_COMMUNITY): Payer: Self-pay | Admitting: Orthopedic Surgery

## 2017-05-13 DIAGNOSIS — Z8614 Personal history of Methicillin resistant Staphylococcus aureus infection: Secondary | ICD-10-CM

## 2017-05-13 DIAGNOSIS — B182 Chronic viral hepatitis C: Secondary | ICD-10-CM

## 2017-05-13 DIAGNOSIS — Z888 Allergy status to other drugs, medicaments and biological substances status: Secondary | ICD-10-CM

## 2017-05-13 DIAGNOSIS — F319 Bipolar disorder, unspecified: Secondary | ICD-10-CM

## 2017-05-13 DIAGNOSIS — F1990 Other psychoactive substance use, unspecified, uncomplicated: Secondary | ICD-10-CM

## 2017-05-13 DIAGNOSIS — T8451XA Infection and inflammatory reaction due to internal right hip prosthesis, initial encounter: Principal | ICD-10-CM

## 2017-05-13 DIAGNOSIS — Z91048 Other nonmedicinal substance allergy status: Secondary | ICD-10-CM

## 2017-05-13 DIAGNOSIS — Z886 Allergy status to analgesic agent status: Secondary | ICD-10-CM

## 2017-05-13 LAB — BPAM RBC
BLOOD PRODUCT EXPIRATION DATE: 201901102359
Blood Product Expiration Date: 201901102359
ISSUE DATE / TIME: 201812181641
ISSUE DATE / TIME: 201812181641
UNIT TYPE AND RH: 6200
Unit Type and Rh: 6200

## 2017-05-13 LAB — BASIC METABOLIC PANEL
Anion gap: 3 — ABNORMAL LOW (ref 5–15)
BUN: 8 mg/dL (ref 6–20)
CHLORIDE: 107 mmol/L (ref 101–111)
CO2: 26 mmol/L (ref 22–32)
CREATININE: 0.64 mg/dL (ref 0.44–1.00)
Calcium: 8.1 mg/dL — ABNORMAL LOW (ref 8.9–10.3)
GFR calc non Af Amer: >60 mL/min (ref >60)
Glucose, Bld: 111 mg/dL — ABNORMAL HIGH (ref 65–99)
POTASSIUM: 4.2 mmol/L (ref 3.5–5.1)
Sodium: 136 mmol/L (ref 135–145)

## 2017-05-13 LAB — TYPE AND SCREEN
ABO/RH(D): A POS
Antibody Screen: NEGATIVE
Unit division: 0
Unit division: 0

## 2017-05-13 LAB — CBC
HEMATOCRIT: 29.7 % — AB (ref 36.0–46.0)
HEMOGLOBIN: 9.4 g/dL — AB (ref 12.0–15.0)
MCH: 26.9 pg (ref 26.0–34.0)
MCHC: 31.6 g/dL (ref 30.0–36.0)
MCV: 84.9 fL (ref 78.0–100.0)
Platelets: 201 10*3/uL (ref 150–400)
RBC: 3.5 MIL/uL — AB (ref 3.87–5.11)
RDW: 17.4 % — ABNORMAL HIGH (ref 11.5–15.5)
WBC: 8.3 10*3/uL (ref 4.0–10.5)

## 2017-05-13 MED ORDER — SODIUM CHLORIDE 0.9% FLUSH
10.0000 mL | INTRAVENOUS | Status: DC | PRN
  Administered 2017-05-21: 10 mL
  Filled 2017-05-13: qty 40

## 2017-05-13 MED ORDER — NICOTINE 14 MG/24HR TD PT24
14.0000 mg | MEDICATED_PATCH | Freq: Every day | TRANSDERMAL | Status: DC
  Administered 2017-05-13 – 2017-05-22 (×10): 14 mg via TRANSDERMAL
  Filled 2017-05-13 (×10): qty 1

## 2017-05-13 NOTE — Progress Notes (Signed)
Peripherally Inserted Central Catheter/Midline Placement  The IV Nurse has discussed with the patient and/or persons authorized to consent for the patient, the purpose of this procedure and the potential benefits and risks involved with this procedure.  The benefits include less needle sticks, lab draws from the catheter, and the patient may be discharged home with the catheter. Risks include, but not limited to, infection, bleeding, blood clot (thrombus formation), and puncture of an artery; nerve damage and irregular heartbeat and possibility to perform a PICC exchange if needed/ordered by physician.  Alternatives to this procedure were also discussed.  Bard Power PICC patient education guide, fact sheet on infection prevention and patient information card has been provided to patient /or left at bedside.    PICC/Midline Placement Documentation  PICC Single Lumen 05/13/17 PICC Right Brachial 37 cm 0 cm (Active)  Indication for Insertion or Continuance of Line Home intravenous therapies (PICC only) 05/13/2017  5:00 PM  Exposed Catheter (cm) 0 cm 05/13/2017  5:00 PM  Site Assessment Clean;Dry;Intact 05/13/2017  5:00 PM  Line Status Flushed;Blood return noted 05/13/2017  5:00 PM  Dressing Type Transparent 05/13/2017  5:00 PM  Dressing Status Clean;Dry;Intact;Antimicrobial disc in place 05/13/2017  5:00 PM  Dressing Intervention New dressing 05/13/2017  5:00 PM  Dressing Change Due 05/20/17 05/13/2017  5:00 PM       Jule Economy Horton 05/13/2017, 5:01 PM

## 2017-05-13 NOTE — Evaluation (Signed)
Occupational Therapy Evaluation Patient Details Name: [name] MRN: [id number] DOB: [birth year] Today's Date: 05/13/2017    History of Present Illness s/p right hip spacer secondary to infection   Clinical Impression   This 56 year old female was admitted for the above. She will benefit from continued OT to increase safety and independence with adls following pwb and posterior thps.  Goals in acute are for supervision level.     Follow Up Recommendations  Supervision/Assistance - 24 hour    Equipment Recommendations  3 in 1 bedside commode    Recommendations for Other Services       Precautions / Restrictions Precautions Precautions: Posterior Hip Restrictions Weight Bearing Restrictions: Yes RLE Weight Bearing: Partial weight bearing RLE Partial Weight Bearing Percentage or Pounds: 50%      Mobility Bed Mobility Overal bed mobility: Needs Assistance Bed Mobility: Sit to Supine       Sit to supine: Min assist   General bed mobility comments: for RLE  Transfers Overall transfer level: Needs assistance Equipment used: Rolling walker (2 wheeled) Transfers: Sit to/from Stand Sit to Stand: Min guard         General transfer comment: for safety; cues for UE/LE placement    Balance                                           ADL either performed or assessed with clinical judgement   ADL Overall ADL's : Needs assistance/impaired Eating/Feeding: Independent   Grooming: Set up;Sitting   Upper Body Bathing: Supervision/ safety;Set up;Sitting   Lower Body Bathing: Moderate assistance;Sit to/from stand   Upper Body Dressing : Set up;Supervision/safety;Sitting   Lower Body Dressing: Maximal assistance;Sit to/from stand   Toilet Transfer: Min guard;Stand-pivot;RW   Toileting- Water quality scientist and Hygiene: Minimal assistance;Sit to/from stand         General ADL Comments: pt needs cues for THPs in function, but can verbalize  these.  She no longer has AE from past sx.     Vision         Perception     Praxis      Pertinent Vitals/Pain Pain Assessment: Faces Faces Pain Scale: Hurts even more Pain Location: R hip Pain Descriptors / Indicators: Aching Pain Intervention(s): Limited activity within patient's tolerance;Monitored during session;Premedicated before session;Repositioned;Ice applied     Hand Dominance     Extremity/Trunk Assessment Upper Extremity Assessment Upper Extremity Assessment: Overall WFL for tasks assessed           Communication Communication Communication: No difficulties   Cognition Arousal/Alertness: Awake/alert Behavior During Therapy: WFL for tasks assessed/performed                                   General Comments: pt needs cues for posterior THPs; distracted at times   General Comments       Exercises     Shoulder Instructions      Home Living   Living Arrangements: Alone                               Additional Comments: pt does not have AE; has 4 wheel walker      Prior Functioning/Environment  OT Problem List: Decreased activity tolerance;Pain;Decreased knowledge of use of DME or AE;Decreased knowledge of precautions      OT Treatment/Interventions: Self-care/ADL training;DME and/or AE instruction;Patient/family education    OT Goals(Current goals can be found in the care plan section) Acute Rehab OT Goals Patient Stated Goal: none stated OT Goal Formulation: With patient Time For Goal Achievement: 05/27/17 Potential to Achieve Goals: Good ADL Goals Pt Will Perform Lower Body Bathing: with supervision;with adaptive equipment;sit to/from stand Pt Will Perform Lower Body Dressing: with supervision;with adaptive equipment;sit to/from stand Pt Will Transfer to Toilet: with supervision;bedside commode;ambulating Pt Will Perform Toileting - Clothing Manipulation and hygiene: with  supervision;sit to/from stand Additional ADL Goal #1: pt will not need any cues for posterior THPs nor PWB  OT Frequency: Min 2X/week   Barriers to D/C:            Co-evaluation              AM-PAC PT "6 Clicks" Daily Activity     Outcome Measure Help from another person eating meals?: None Help from another person taking care of personal grooming?: A Little Help from another person toileting, which includes using toliet, bedpan, or urinal?: A Little Help from another person bathing (including washing, rinsing, drying)?: A Lot Help from another person to put on and taking off regular upper body clothing?: A Little Help from another person to put on and taking off regular lower body clothing?: A Lot 6 Click Score: 17   End of Session    Activity Tolerance: Patient limited by pain Patient left: in bed;with call bell/phone within reach;with bed alarm set  OT Visit Diagnosis: Pain Pain - Right/Left: Right Pain - part of body: Hip                Time: 7482-7078 OT Time Calculation (min): 20 min Charges:  OT General Charges $OT Visit: 1 Visit OT Evaluation $OT Eval Low Complexity: 1 Low G-Codes:     Petty, OTR/L [phone] 05/13/2017  [name] 05/13/2017, 11:36 AM

## 2017-05-13 NOTE — Evaluation (Signed)
Physical Therapy Evaluation Patient Details Name: [name] MRN: [id number] DOB: 12-Apr-1961 Today's Date: 05/13/2017   History of Present Illness  Pt is a 56 year old female s/p Right total hip resection with antibiotic spacer  Clinical Impression  Patient is s/p above surgery resulting in functional limitations due to the deficits listed below (see PT Problem List).  Patient will benefit from skilled PT to increase their independence and safety with mobility to allow discharge to the venue listed below.  Pt requiring cues for her posterior hip precautions and PWB.  Pt anticipates d/c home however reports her children have been living there and "it's not very clean."  Pt states she may have to sleep on the couch.  Pt reports no stairs at home and has a rollator.  Pt declined ambulating this session so assisted OOB to recliner.     Follow Up Recommendations Home health PT    Equipment Recommendations  Rolling walker with 5" wheels    Recommendations for Other Services       Precautions / Restrictions Precautions Precautions: Posterior Hip Precaution Comments: educated and provided handout on posterior hip precautions Restrictions Weight Bearing Restrictions: Yes RLE Weight Bearing: Partial weight bearing RLE Partial Weight Bearing Percentage or Pounds: 50%      Mobility  Bed Mobility Overal bed mobility: Needs Assistance Bed Mobility: Supine to Sit     Supine to sit: Min assist Sit to supine: Min assist   General bed mobility comments: assist for R LE  Transfers Overall transfer level: Needs assistance Equipment used: Rolling walker (2 wheeled) Transfers: Sit to/from [facility] Sit to Stand: Min guard Stand pivot transfers: Min guard       General transfer comment: verbal cues for safety and maintaining hip precautions  Ambulation/Gait                Stairs            Wheelchair Mobility    Modified Rankin (Stroke Patients  Only)       Balance                                             Pertinent Vitals/Pain Pain Assessment: 0-10 Pain Score: 6  Faces Pain Scale: Hurts even more Pain Location: R hip Pain Descriptors / Indicators: Aching Pain Intervention(s): Limited activity within patient's tolerance;Repositioned;Monitored during session;Ice applied;Premedicated before session    Home Living Family/patient expects to be discharged to:: Private residence Living Arrangements: Alone     Home Access: Level entry     Home Layout: One level Home Equipment: Environmental consultant - 4 wheels Additional Comments: pt does not have AE; has 4 wheel walker    Prior Function Level of Independence: Independent with assistive device(s)         Comments: pt reports her daughter and son in law have been living in her home     Hand Dominance        Extremity/Trunk Assessment   Upper Extremity Assessment Upper Extremity Assessment: Overall WFL for tasks assessed    Lower Extremity Assessment Lower Extremity Assessment: RLE deficits/detail RLE Deficits / Details: anticipated post op hip weakness, R LE requiring assist for bed mobility       Communication   Communication: No difficulties  Cognition Arousal/Alertness: Awake/alert Behavior During Therapy: WFL for tasks assessed/performed  General Comments: constant cues for posterior hip precautions, slightly impulsive      General Comments      Exercises     Assessment/Plan    PT Assessment Patient needs continued PT services  PT Problem List Decreased strength;Decreased mobility;Decreased balance;Decreased knowledge of use of DME;Decreased safety awareness;Decreased knowledge of precautions;Pain       PT Treatment Interventions Gait training;DME instruction;Therapeutic activities;Therapeutic exercise;Functional mobility training;Patient/family education;Stair training    PT Goals  (Current goals can be found in the Care Plan section)  Acute Rehab PT Goals Patient Stated Goal: none stated PT Goal Formulation: With patient Time For Goal Achievement: 05/18/17 Potential to Achieve Goals: Good    Frequency 7X/week   Barriers to discharge        Co-evaluation               AM-PAC PT "6 Clicks" Daily Activity  Outcome Measure Difficulty turning over in bed (including adjusting bedclothes, sheets and blankets)?: A Little Difficulty moving from lying on back to sitting on the side of the bed? : Unable Difficulty sitting down on and standing up from a chair with arms (e.g., wheelchair, bedside commode, etc,.)?: Unable Help needed moving to and from a bed to chair (including a wheelchair)?: A Little Help needed walking in hospital room?: A Lot Help needed climbing 3-5 steps with a railing? : A Lot 6 Click Score: 12    End of Session Equipment Utilized During Treatment: Gait belt Activity Tolerance: Patient tolerated treatment well Patient left: in chair;with chair alarm set;with call bell/phone within reach Nurse Communication: Mobility status PT Visit Diagnosis: Other abnormalities of gait and mobility (R26.89)    Time: 4627-0350 PT Time Calculation (min) (ACUTE ONLY): 15 min   Charges:   PT Evaluation $PT Eval Low Complexity: 1 Low     PT G CodesCarmelia Bake, PT, DPT 05/13/2017 Pager: [phone]  York Ram E 05/13/2017, 11:50 AM

## 2017-05-13 NOTE — Progress Notes (Signed)
Physical Therapy Treatment Patient Details Name: [name] MRN: [id number] DOB: 1960-11-22 Today's Date: 05/13/2017    History of Present Illness Pt is a 56 year old female s/p Right total hip resection with antibiotic spacer    PT Comments    Pt reporting pain 11/10 in R hip with ambulation/mobility despite premedication an hour prior (per RN).  RN notified of increased pain.  Pt only able to tolerate ambulating within room.  Pt reports possible d/c plan now for SNF due to NOT being a candidate for home IV antibiotics (ID MD in room prior to session).   Follow Up Recommendations  SNF     Equipment Recommendations  Rolling walker with 5" wheels    Recommendations for Other Services       Precautions / Restrictions Precautions Precautions: Posterior Hip Precaution Comments: reviewed posterior hip precautions and PWB Restrictions Weight Bearing Restrictions: Yes RLE Weight Bearing: Partial weight bearing RLE Partial Weight Bearing Percentage or Pounds: 50%    Mobility  Bed Mobility Overal bed mobility: Needs Assistance Bed Mobility: Supine to Sit;Sit to Supine     Supine to sit: Min assist Sit to supine: Min assist   General bed mobility comments: assist for R LE  Transfers Overall transfer level: Needs assistance Equipment used: Rolling walker (2 wheeled) Transfers: Sit to/from Stand Sit to Stand: Min guard Stand pivot transfers: Min guard       General transfer comment: verbal cues for safety and maintaining hip precautions  Ambulation/Gait Ambulation/Gait assistance: Min guard Ambulation Distance (Feet): 10 Feet Assistive device: Rolling walker (2 wheeled) Gait Pattern/deviations: Step-to pattern;Decreased stance time - right;Antalgic     General Gait Details: pt with shakey UEs (possible from pain however also requesting nicotine patch - RN notified) and reports 11/10 pain with mobility   Stairs            Wheelchair Mobility    Modified  Rankin (Stroke Patients Only)       Balance                                            Cognition Arousal/Alertness: Awake/alert Behavior During Therapy: WFL for tasks assessed/performed                                   General Comments: constant cues for posterior hip precautions, slightly impulsive      Exercises      General Comments        Pertinent Vitals/Pain Pain Assessment: 0-10 Pain Score: 10-Worst pain ever Pain Location: R hip Pain Descriptors / Indicators: Aching Pain Intervention(s): Monitored during session;Premedicated before session;Patient requesting pain meds-RN notified;Limited activity within patient's tolerance;Repositioned;Ice applied    Home Living Family/patient expects to be discharged to:: Private residence Living Arrangements: Alone     Home Access: Level entry   Home Layout: One level Home Equipment: Environmental consultant - 4 wheels      Prior Function Level of Independence: Independent with assistive device(s)      Comments: pt reports her daughter and son in law have been living in her home   PT Goals (current goals can now be found in the care plan section) Acute Rehab PT Goals PT Goal Formulation: With patient Time For Goal Achievement: 05/18/17 Potential to Achieve Goals: Good Progress towards PT  goals: Progressing toward goals    Frequency    7X/week      PT Plan Discharge plan needs to be updated    Co-evaluation              AM-PAC PT "6 Clicks" Daily Activity  Outcome Measure  Difficulty turning over in bed (including adjusting bedclothes, sheets and blankets)?: A Little Difficulty moving from lying on back to sitting on the side of the bed? : Unable Difficulty sitting down on and standing up from a chair with arms (e.g., wheelchair, bedside commode, etc,.)?: Unable Help needed moving to and from a bed to chair (including a wheelchair)?: A Little Help needed walking in hospital room?: A  Little Help needed climbing 3-5 steps with a railing? : A Lot 6 Click Score: 13    End of Session Equipment Utilized During Treatment: Gait belt Activity Tolerance: Patient limited by pain Patient left: with call bell/phone within reach;in bed Nurse Communication: Mobility status;Patient requests pain meds PT Visit Diagnosis: Other abnormalities of gait and mobility (R26.89)     Time: 9892-1194 PT Time Calculation (min) (ACUTE ONLY): 12 min  Charges:  $Gait Training: 8-22 mins                    G Codes:       Carmelia Bake, PT, DPT 05/13/2017 Pager: [phone]  York Ram E 05/13/2017, 2:43 PM

## 2017-05-13 NOTE — Consult Note (Signed)
[facility] for Infectious Disease       Reason for Consult: prosthetic hip infection    Referring Physician: Dr. Alvan Dame  Principal Problem:   Right hip spacer / infection   . amphetamine-dextroamphetamine  20 mg Oral TID WC  . docusate sodium  100 mg Oral BID  . ferrous sulfate  325 mg Oral TID PC  . polyethylene glycol  17 g Oral BID  . 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  Recent aspiration with MSSA, OR cultures pending with GPC in pairs on gram stain.     She has a history of IVDU and previous MRSA bacteremia from cellulitis resulting in her IVDU and is NOT a candidate for home IV antibiotics so will need placement for the duration of her antibiotics.  The patient is agreeable.    Chronic hepatitis C - may be a candidate for treatment if she remains drug free.  I will discuss this with her at follow up after treatment completion of her current problem.    Antibiotics: vancomycin  HPI: [name] is a 56 y.o. female with THA about 10 years ago and bacteremia with MRSA in November 2017 from cellulitis and abscess with IVDU who has noted 3 months of hip pain.  Aspiration was done and culture with MSSA and now operative management with resection arthroplasty.  She reports continued pain.  No associated rash.  Is itchy.  Denies recent drug use.  Not currently under any care from [facility] for her bipolar disorder.  No n/v.   Review of Systems:  Constitutional: negative for fevers, chills and anorexia Gastrointestinal: negative for diarrhea Integument/breast: negative for rash All other systems reviewed and are negative    Past Medical History:  Diagnosis Date  . Anxiety   . Bipolar affective disorder ([place])    ADHD AND SCHZIOPHRENIC--PT GOES TO [place]. BLANKMAN  . Blood transfusion without  reported diagnosis   . Cervical radiculopathy   . Chronic pain syndrome   . Complication of anesthesia    STATES SHE WOKE UP TWICE DURING SURGERIES  . DJD (degenerative joint disease)   . GERD (gastroesophageal reflux disease)   . H/O hiatal hernia   . Hepatitis C infection    STATES COMPLETED TREATMENT AND NO LONGER HAS INFECTION  . Hypertension   . Leukocytopenia   . Osteoarthritis   . Osteoporosis   . Peptic ulcer disease   . Pneumonia 2013  . Polysubstance (excluding opioids) dependence ([place])   . Seizures ([facility])    SEIZURE AFTER BREATHING TX (ALBUTEROL) IN ER; ALSO PAST HX SEIZURES DURING DRUG DETOX  . Shortness of breath    WITH EXERTION; SMOKER  . Thrombocytopenia ([facility])     Social History   Tobacco Use  . Smoking status: Current Some Day Smoker    Packs/day: 0.25    Years: 20.00    Pack years: 5.00    Types: Cigarettes  . Smokeless tobacco: Never Used  . Tobacco comment: 4/day 05/15/16; a pack a week   Substance Use Topics  . Alcohol use: No    Comment: HX OF ABUSE OF PAIN MEDS--PT IS CURRENTLY IN TREATMENT AT [facility]  . Drug use: No    Family History  Problem Relation Age of Onset  . Breast cancer Sister   .  Cancer Sister   . Heart disease Mother   . Heart disease Father     Allergies  Allergen Reactions  . Albuterol Other (See Comments)    PT states she had seizures from an albuterol treatment and was hospitalized for it  . Aspirin Hives    Vomits blood  . Adhesive [Tape] Rash    Physical Exam: Constitutional: well developed and well nourished  Vitals:   05/13/17 0107 05/13/17 1109  BP: 130/68 130/68  Pulse: 84 85  Resp: 15 18  Temp: 97.8 F (36.6 C) 98.3 F (36.8 C)  SpO2: 99% 100%   EYES: anicteric ENMT: no thrush Cardiovascular: Cor RRR Respiratory: CTA B; normal respiratory effort GI: Bowel sounds are normal, liver is not enlarged, spleen is not enlarged Musculoskeletal: no pedal edema noted Skin:  negatives: no rash Hematologic: no cervical lad  Lab Results  Component Value Date   WBC 8.3 05/13/2017   HGB 9.4 (L) 05/13/2017   HCT 29.7 (L) 05/13/2017   MCV 84.9 05/13/2017   PLT 201 05/13/2017    Lab Results  Component Value Date   CREATININE 0.64 05/13/2017   BUN 8 05/13/2017   NA 136 05/13/2017   K 4.2 05/13/2017   CL 107 05/13/2017   CO2 26 05/13/2017    Lab Results  Component Value Date   ALT 9 (L) 05/08/2017   AST 22 05/08/2017   ALKPHOS 72 05/08/2017     Microbiology: Recent Results (from the past 240 hour(s))  Surgical pcr screen     Status: Abnormal   Collection Time: 05/08/17  2:40 PM  Result Value Ref Range Status   MRSA, PCR POSITIVE (A) NEGATIVE Final    Comment: RESULT CALLED TO, READ BACK BY AND VERIFIED WITH: MATT BABISH,PA [id number] @1111  BY V.WILKINS    Staphylococcus aureus POSITIVE (A) NEGATIVE Final  Body fluid culture     Status: None (Preliminary result)   Collection Time: 05/12/17  4:25 PM  Result Value Ref Range Status   Specimen Description SYNOVIAL RIGHT HIP  Final   Special Requests NONE  Final   Gram Stain   Final    FEW WBC PRESENT,BOTH PMN AND MONONUCLEAR RARE GRAM POSITIVE COCCI IN PAIRS Gram Stain Report Called to,Read Back By and Verified With: P.COTTA AT 1711 ON 05/12/17 BY N.THOMPSON    Culture   Final    TOO YOUNG TO READ Performed at [facility], [address], [place],  [address]    Report Status PENDING  Incomplete    Thayer Headings, [facility] for Infectious Disease [place] [email or web address] [phone] pager  [phone] cell 05/13/2017, 1:04 PM

## 2017-05-13 NOTE — Progress Notes (Signed)
     Subjective: 1 Day Post-Op Procedure(s) (LRB): Right total hip resection WITH ANTIBIOTIC SPACER (Right)   Patient reports pain as moderate, pain controlled.  States most of her pain is in the outside of the thigh from the hip to the knee. We discussed a muscle relaxer to help the pain. Also discussed that she should get a PICC line today and we will call ID to get the involved.  Objective:   VITALS:   Vitals:   05/12/17 2249 05/13/17 0107  BP: (!) 141/72 130/68  Pulse: 90 84  Resp: 13 15  Temp: 97.6 F (36.4 C) 97.8 F (36.6 C)  SpO2: 99% 99%    Dorsiflexion/Plantar flexion intact Incision: dressing C/D/I No cellulitis present Compartment soft  LABS Recent Labs    05/13/17 0536  HGB 9.4*  HCT 29.7*  WBC 8.3  PLT 201    Recent Labs    05/13/17 0536  NA 136  K 4.2  BUN 8  CREATININE 0.64  GLUCOSE 111*     Assessment/Plan: 1 Day Post-Op Procedure(s) (LRB): Right total hip resection WITH ANTIBIOTIC SPACER (Right)  PICC line order yesterday, should get today Will call ID disease for a consult Advance diet Up with therapy D/C IV fluids Discharge disposition TBD      West Pugh. Saylor Sheckler   PAC  05/13/2017, 8:43 AM

## 2017-05-14 DIAGNOSIS — T8450XA Infection and inflammatory reaction due to unspecified internal joint prosthesis, initial encounter: Secondary | ICD-10-CM

## 2017-05-14 LAB — BASIC METABOLIC PANEL
Anion gap: 2 — ABNORMAL LOW (ref 5–15)
BUN: 10 mg/dL (ref 6–20)
CO2: 29 mmol/L (ref 22–32)
CREATININE: 0.76 mg/dL (ref 0.44–1.00)
Calcium: 8.3 mg/dL — ABNORMAL LOW (ref 8.9–10.3)
Chloride: 108 mmol/L (ref 101–111)
Glucose, Bld: 112 mg/dL — ABNORMAL HIGH (ref 65–99)
Potassium: 4 mmol/L (ref 3.5–5.1)
SODIUM: 139 mmol/L (ref 135–145)

## 2017-05-14 LAB — CBC
HCT: 24.8 % — ABNORMAL LOW (ref 36.0–46.0)
Hemoglobin: 7.8 g/dL — ABNORMAL LOW (ref 12.0–15.0)
MCH: 27.1 pg (ref 26.0–34.0)
MCHC: 31.5 g/dL (ref 30.0–36.0)
MCV: 86.1 fL (ref 78.0–100.0)
PLATELETS: 165 10*3/uL (ref 150–400)
RBC: 2.88 MIL/uL — AB (ref 3.87–5.11)
RDW: 17.4 % — ABNORMAL HIGH (ref 11.5–15.5)
WBC: 5.6 10*3/uL (ref 4.0–10.5)

## 2017-05-14 MED ORDER — OXYCODONE HCL 5 MG PO TABS
10.0000 mg | ORAL_TABLET | ORAL | Status: DC | PRN
  Administered 2017-05-16 – 2017-05-20 (×5): 10 mg via ORAL
  Filled 2017-05-14 (×5): qty 2

## 2017-05-14 MED ORDER — ACETAMINOPHEN 500 MG PO TABS
1000.0000 mg | ORAL_TABLET | Freq: Four times a day (QID) | ORAL | Status: DC
  Administered 2017-05-14 – 2017-05-18 (×5): 1000 mg via ORAL
  Filled 2017-05-14 (×10): qty 2

## 2017-05-14 MED ORDER — OXYCODONE HCL 5 MG PO TABS
15.0000 mg | ORAL_TABLET | ORAL | Status: DC | PRN
  Administered 2017-05-14 – 2017-05-19 (×21): 15 mg via ORAL
  Administered 2017-05-20: 10 mg via ORAL
  Administered 2017-05-20 – 2017-05-22 (×9): 15 mg via ORAL
  Filled 2017-05-14 (×33): qty 3

## 2017-05-14 MED ORDER — KETOROLAC TROMETHAMINE 15 MG/ML IJ SOLN
7.5000 mg | Freq: Four times a day (QID) | INTRAMUSCULAR | Status: AC
  Administered 2017-05-14 – 2017-05-15 (×4): 7.5 mg via INTRAVENOUS
  Filled 2017-05-14 (×4): qty 1

## 2017-05-14 NOTE — Clinical Social Work Note (Signed)
Clinical Social Work Assessment  Patient Details  Name: [name] MRN: [id number] Date of Birth: May 14, 1961  Date of referral:  05/14/17               Reason for consult:  Facility Placement                Permission sought to share information with:  Facility Sport and exercise psychologist, Family Supports Permission granted to share information::  Yes, Verbal Permission Granted  Name::        Agency::     Relationship::     Contact Information:     Housing/Transportation Living arrangements for the past 2 months:  Single Family Home Source of Information:  Patient Patient Interpreter Needed:  None Criminal Activity/Legal Involvement Pertinent to Current Situation/Hospitalization:  No - Comment as needed Significant Relationships:  None Lives with:  Adult Children, Spouse Do you feel safe going back to the place where you live? Yes Need for family participation in patient care:  Yes (Dependent w/mobility)  Care giving concerns: Patient has prosthetic hip infection s/p resection for femur fracture. The patient is not a candidate for home antibiotics and will need SNF placement for duration of her IV treatment.    Social Worker assessment / plan:  CSW met with the patient at bedside, explained role and reason for visit- to assist with discharge to SNF. Patient reports she lives with her son and spouse. She reports they work and will not be able to assist her at home.  Patient is agreeable to SNF to receive IV antibiotics. CSW explain SNF process and later following up with bed offers.  CSW completed PASRR, went to level II.  FL2 complete and faxed out.   Plan: Assist with discharge to SNF.   Employment status:  Disabled (Comment on whether or not currently receiving Disability) Insurance information:  Medicare PT Recommendations:  Home with [place], [place] / Referral to community resources:  [place]  Patient/Family's Response to  care: Agreeable to further treatment at SNF.   Patient/Family's Understanding of and Emotional Response to Diagnosis, Current Treatment, and Prognosis:  No family at bedside. The patient is knowledgeable of diagnosis and need for further treatment at rehab/nursing facility.   Emotional Assessment Appearance:  Developmentally appropriate Attitude/Demeanor/Rapport:    Affect (typically observed):  Accepting, Calm Orientation:  Oriented to Self, Oriented to Place, Oriented to  Time, Oriented to Situation Alcohol / Substance use:  Not Applicable Psych involvement (Current and /or in the community):  No (Comment)  Discharge Needs  Concerns to be addressed:  Discharge Planning Concerns Readmission within the last 30 days:  No Current discharge risk:  Dependent with Mobility, Other(IV Antibiotics ) Barriers to Discharge:  Continued Medical Work up, Programmer, applications ([place])   Lia Hopping, LCSW 05/14/2017, 12:39 PM

## 2017-05-14 NOTE — Progress Notes (Signed)
     Subjective: 2 Days Post-Op Procedure(s) (LRB): Right total hip resection WITH ANTIBIOTIC SPACER (Right)   Patient reports pain as moderate, not fully controlled. No events throughout the night otherwise.   Objective:   VITALS:   Vitals:   05/14/17 0634 05/14/17 1431  BP: (!) 107/55 124/61  Pulse: 79 91  Resp: 16 16  Temp: 98.9 F (37.2 C) 98 F (36.7 C)  SpO2: 100% 99%    Dorsiflexion/Plantar flexion intact Incision: dressing C/D/I No cellulitis present Compartment soft  LABS Recent Labs    05/13/17 0536 05/14/17 0426  HGB 9.4* 7.8*  HCT 29.7* 24.8*  WBC 8.3 5.6  PLT 201 165    Recent Labs    05/13/17 0536 05/14/17 0426  NA 136 139  K 4.2 4.0  BUN 8 10  CREATININE 0.64 0.76  GLUCOSE 111* 112*     Assessment/Plan: 2 Days Post-Op Procedure(s) (LRB): Right total hip resection WITH ANTIBIOTIC SPACER (Right) Toradol added Oxy changed Up with therapy Discharge to SNF when ready, possibly tomorrow    [place] Pugh. Mario Voong   PAC  05/14/2017, 4:32 PM

## 2017-05-14 NOTE — Progress Notes (Signed)
    [facility] for Infectious Disease   Reason for visit: Follow up on PJI  Interval History: complaint of itching; normal wbc; no associated rash, no diarrhea  Physical Exam: Constitutional:  Vitals:   05/14/17 0634 05/14/17 1431  BP: (!) 107/55 124/61  Pulse: 79 91  Resp: 16 16  Temp: 98.9 F (37.2 C) 98 F (36.7 C)  SpO2: 100% 99%   patient appears in NAD except with itching Respiratory: Normal respiratory effort; CTA B Cardiovascular: RRR GI: soft, nt, nd  Review of Systems: Constitutional: negative for fevers and chills Gastrointestinal: negative for diarrhea  Lab Results  Component Value Date   WBC 5.6 05/14/2017   HGB 7.8 (L) 05/14/2017   HCT 24.8 (L) 05/14/2017   MCV 86.1 05/14/2017   PLT 165 05/14/2017    Lab Results  Component Value Date   CREATININE 0.76 05/14/2017   BUN 10 05/14/2017   NA 139 05/14/2017   K 4.0 05/14/2017   CL 108 05/14/2017   CO2 29 05/14/2017    Lab Results  Component Value Date   ALT 9 (L) 05/08/2017   AST 22 05/08/2017   ALKPHOS 72 05/08/2017     Microbiology: Recent Results (from the past 240 hour(s))  Surgical pcr screen     Status: Abnormal   Collection Time: 05/08/17  2:40 PM  Result Value Ref Range Status   MRSA, PCR POSITIVE (A) NEGATIVE Final    Comment: RESULT CALLED TO, READ BACK BY AND VERIFIED WITH: MATT BABISH,PA [id number] @1111  BY V.WILKINS    Staphylococcus aureus POSITIVE (A) NEGATIVE Final  Body fluid culture     Status: None (Preliminary result)   Collection Time: 05/12/17  4:25 PM  Result Value Ref Range Status   Specimen Description SYNOVIAL RIGHT HIP  Final   Special Requests NONE  Final   Gram Stain   Final    FEW WBC PRESENT,BOTH PMN AND MONONUCLEAR RARE GRAM POSITIVE COCCI IN PAIRS Gram Stain Report Called to,Read Back By and Verified With: P.COTTA AT 1711 ON 05/12/17 BY N.THOMPSON    Culture   Final    FEW STAPHYLOCOCCUS AUREUS SUSCEPTIBILITIES TO FOLLOW Performed at [facility], [address], [place], [place] [address]    Report Status PENDING  Incomplete  Anaerobic culture     Status: None (Preliminary result)   Collection Time: 05/12/17  4:25 PM  Result Value Ref Range Status   Specimen Description SYNOVIAL RIGHT HIP  Final   Special Requests NONE  Final   Culture   Final    NO GROWTH 2 DAYS Performed at [facility], [place] [address] [place],  [address]    Report Status PENDING  Incomplete    Impression/Plan:  1. PJI - s/p resection arthroplasty.  On vancomycin pending sensitivities. Growing Staph aureus.  Should have final ID tomorrow.    2. dispo - going to SNF at discharge.

## 2017-05-14 NOTE — NC FL2 (Signed)
[place] LEVEL OF CARE SCREENING TOOL     IDENTIFICATION  Patient Name: [name] Birthdate: [birth year] Sex: female Admission Date (Current Location): 05/12/2017  [facility] and [place] Number:  Herbalist and Address:  [facility],  [place] [address] [place]      Provider Number: [id number]  Attending Physician Name and Address:  Paralee Cancel, MD  Relative Name and Phone Number:       Current Level of Care: Hospital Recommended Level of Care: [place] Prior Approval Number:    Date Approved/Denied:   PASRR Number:  Pending  Discharge Plan: SNF    Current Diagnoses: Patient Active Problem List   Diagnosis Date Noted  . Right hip spacer / infection 05/12/2017  . IVDU (intravenous drug user) 05/14/2016  . Polysubstance abuse ([place])   . Upper GI bleed   . Cirrhosis of liver without ascites ([place])   . MRSA bacteremia 04/15/2016  . Bipolar affective disorder ([place]) 04/15/2016  . Abscess of right hand 04/14/2016  . ARF (acute renal failure) ([place]) 10/05/2014  . History of bipolar disorder 10/05/2014  . History of cirrhosis 10/05/2014  . Essential hypertension 10/05/2014  . S/P left THA, AA 08/25/2012  . Expected blood loss anemia 08/25/2012  . Obesity (BMI 30-39.9) 08/25/2012  . Pancytopenia 09/08/2011  . Community acquired pneumonia of right lower lobe of lung ([place]) 09/04/2011  . Anemia 09/04/2011  . PUD (peptic ulcer disease)   . Thrombocytopenia ([place])   . Hepatitis C infection     Orientation RESPIRATION BLADDER Height & Weight     Self, Situation, Time, Place  Normal Continent Weight: 148 lb (67.1 kg) Height:  5\' 7"  (170.2 cm)  BEHAVIORAL SYMPTOMS/MOOD NEUROLOGICAL BOWEL NUTRITION STATUS      Continent Diet(Regular )  AMBULATORY STATUS COMMUNICATION OF NEEDS Skin   Extensive Assist Verbally Normal                       Personal Care Assistance Level of Assistance  Bathing, Feeding,  Dressing Bathing Assistance: Limited assistance Feeding assistance: Independent Dressing Assistance: Limited assistance     Functional Limitations Info  Sight, Hearing, Speech Sight Info: Adequate Hearing Info: Adequate Speech Info: Adequate    SPECIAL CARE FACTORS FREQUENCY  PT (By licensed PT), OT (By licensed OT)     PT Frequency: 5x/week OT Frequency: 5x/week            Contractures Contractures Info: Not present    Additional Factors Info  Code Status, Allergies Code Status Info: Fullcode Allergies Info: Allergies: Albuterol, Aspirin, Adhesive Tape           Current Medications (05/14/2017):  This is the current hospital active medication list Current Facility-Administered Medications  Medication Dose Route Frequency Provider Last Rate Last Dose  . 0.9 %  sodium chloride infusion   Intravenous Continuous Danae Orleans, PA-C 100 mL/hr at 05/12/17 2012    . 0.9 %  sodium chloride infusion   Intravenous Once Sharlette Dense, CRNA      . acetaminophen (TYLENOL) tablet 650 mg  650 mg Oral Q4H PRN Danae Orleans, PA-C       Or  . acetaminophen (TYLENOL) suppository 650 mg  650 mg Rectal Q4H PRN Danae Orleans, PA-C      . ALPRAZolam Duanne Moron) tablet 1 mg  1 mg Oral BID PRN Danae Orleans, PA-C   1 mg at 05/13/17 2148  . alum & mag  hydroxide-simeth (MAALOX/MYLANTA) 200-200-20 MG/5ML suspension 15 mL  15 mL Oral Q4H PRN Danae Orleans, PA-C   15 mL at 05/13/17 1817  . amphetamine-dextroamphetamine (ADDERALL) tablet 20 mg  20 mg Oral TID WC Babish, Matthew, PA-C      . bisacodyl (DULCOLAX) suppository 10 mg  10 mg Rectal Daily PRN Babish, Matthew, PA-C      . diphenhydrAMINE (BENADRYL) 12.5 MG/5ML elixir 12.5-25 mg  12.5-25 mg Oral Q4H PRN Danae Orleans, PA-C   25 mg at 05/14/17 1103  . docusate sodium (COLACE) capsule 100 mg  100 mg Oral BID Danae Orleans, PA-C   100 mg at 05/14/17 1015  . ferrous sulfate tablet 325 mg  325 mg Oral TID PC Danae Orleans, PA-C    325 mg at 05/13/17 1809  . HYDROmorphone (DILAUDID) injection 0.5-1 mg  0.5-1 mg Intravenous Q2H PRN Danae Orleans, PA-C   1 mg at 05/13/17 1634  . magnesium citrate solution 1 Bottle  1 Bottle Oral Once PRN Babish, Matthew, PA-C      . menthol-cetylpyridinium (CEPACOL) lozenge 3 mg  1 lozenge Oral PRN Danae Orleans, PA-C       Or  . phenol (CHLORASEPTIC) mouth spray 1 spray  1 spray Mouth/Throat PRN Danae Orleans, PA-C      . methocarbamol (ROBAXIN) tablet 500 mg  500 mg Oral Q6H PRN Danae Orleans, PA-C   500 mg at 05/14/17 1103   Or  . methocarbamol (ROBAXIN) 500 mg in dextrose 5 % 50 mL IVPB  500 mg Intravenous Q6H PRN Danae Orleans, PA-C   Stopped at 05/12/17 1920  . metoCLOPramide (REGLAN) tablet 5-10 mg  5-10 mg Oral Q8H PRN Danae Orleans, PA-C       Or  . metoCLOPramide (REGLAN) injection 5-10 mg  5-10 mg Intravenous Q8H PRN Babish, Matthew, PA-C      . nicotine (NICODERM CQ - dosed in mg/24 hours) patch 14 mg  14 mg Transdermal Daily Danae Orleans, PA-C   14 mg at 05/14/17 1018  . ondansetron (ZOFRAN) tablet 4 mg  4 mg Oral Q6H PRN Danae Orleans, PA-C       Or  . ondansetron [facility]) injection 4 mg  4 mg Intravenous Q6H PRN Danae Orleans, PA-C      . oxyCODONE (Oxy IR/ROXICODONE) immediate release tablet 10 mg  10 mg Oral Q3H PRN Danae Orleans, PA-C   10 mg at 05/14/17 1015  . oxyCODONE (Oxy IR/ROXICODONE) immediate release tablet 5 mg  5 mg Oral Q3H PRN Babish, Matthew, PA-C      . polyethylene glycol (MIRALAX / GLYCOLAX) packet 17 g  17 g Oral BID Danae Orleans, PA-C   17 g at 05/14/17 1016  . QUEtiapine (SEROQUEL) tablet 200 mg  200 mg Oral QHS PRN Danae Orleans, PA-C      . rivaroxaban (XARELTO) tablet 10 mg  10 mg Oral Q24H Babish, Matthew, PA-C   10 mg at 05/14/17 0815  . sodium chloride flush (NS) 0.9 % injection 10-40 mL  10-40 mL Intracatheter PRN Paralee Cancel, MD      . vancomycin (VANCOCIN) IVPB 750 mg/150 ml premix  750 mg Intravenous Q12H  Emiliano Dyer, [place] at 05/14/17 0112     Discharge Medications: Please see discharge summary for a list of discharge medications.  Relevant Imaging Results:  Relevant Lab Results:   Additional Information ssn:[phone]/ Patient will needs IV antibiotics.   Lia Hopping, LCSW

## 2017-05-14 NOTE — Progress Notes (Signed)
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  Pt itchy this session; had medication for this  Follow Up Recommendations  SNF    Equipment Recommendations  3 in 1 bedside commode    Recommendations for Other Services      Precautions / Restrictions Precautions Precautions: Posterior Hip Precaution Comments: reviewed posterior THPs and PWB Restrictions Weight Bearing Restrictions: Yes RLE Weight Bearing: Partial weight bearing RLE Partial Weight Bearing Percentage or Pounds: 50%       Mobility Bed Mobility Overal bed mobility: Needs Assistance Bed Mobility: Supine to Sit;Sit to Supine     Supine to sit: Min assist Sit to supine: Min assist   General bed mobility comments: assist for RLE  Transfers Overall transfer level: Needs assistance Equipment used: Rolling walker (2 wheeled) Transfers: Sit to/from Stand Sit to Stand: Min guard         General transfer comment: cues for UE/LE placement    Balance                                           ADL either performed or assessed with clinical judgement   ADL           Upper Body Bathing: Set up;Supervision/ safety;Sitting   Lower Body Bathing: Moderate assistance;Sit to/from stand   Upper Body Dressing : Set up;Sitting                     General ADL Comments: performed bathing from EOB.  Pt with ted hose; did not perform bathing below knees.  Pt itchy and distracted at times. Cues for THPs.  Educated on use of Marine scientist      Cognition Arousal/Alertness: Awake/alert Behavior During Therapy: WFL for tasks assessed/performed                                   General  Comments: cues for THPs; does not follow in functional context        Exercises     Shoulder Instructions       General Comments      Pertinent Vitals/ Pain       Pain Assessment: Faces Faces Pain Scale: Hurts even more Pain Location: R hip and knee Pain Descriptors / Indicators: Sore Pain Intervention(s): Limited activity within patient's tolerance;Monitored during session;Repositioned;Ice applied(had muscle relaxant)  Home Living                                          Prior Functioning/Environment              Frequency  Min 2X/week        Progress Toward Goals  OT Goals(current goals can now be found in the care plan section)  Progress towards OT goals: Progressing toward goals     Plan Discharge plan needs to be updated  Co-evaluation                 AM-PAC PT "6 Clicks" Daily Activity     Outcome Measure   Help from another person eating meals?: None Help from another person taking care of personal grooming?: A Little Help from another person toileting, which includes using toliet, bedpan, or urinal?: A Little Help from another person bathing (including washing, rinsing, drying)?: A Little Help from another person to put on and taking off regular upper body clothing?: A Little Help from another person to put on and taking off regular lower body clothing?: A Little 6 Click Score: 19    End of Session    OT Visit Diagnosis: Pain Pain - Right/Left: Right Pain - part of body: Hip   Activity Tolerance (limited by itching)   Patient Left in bed;with call bell/phone within reach;with bed alarm set   Nurse Communication          Time: 8335-8251 OT Time Calculation (min): 16 min  Charges: OT General Charges $OT Visit: 1 Visit OT Treatments $Self Care/Home Management : 8-22 mins  Lesle Chris, OTR/L [phone] 05/14/2017   Jennavie Martinek 05/14/2017, 2:33 PM

## 2017-05-14 NOTE — Progress Notes (Signed)
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  Pt appeared to tolerate small increase in distance well despite c/o pain.  Pt continues to require frequent cues for posterior hip precautions during movement.  Plan is for SNF possibly tomorrow per pt.   Follow Up Recommendations  SNF     Equipment Recommendations  Rolling walker with 5" wheels    Recommendations for Other Services       Precautions / Restrictions Precautions Precautions: Posterior Hip Precaution Comments: reviewed posterior hip precautions and PWB (pt only able to recall 1/3) Restrictions Weight Bearing Restrictions: Yes RLE Weight Bearing: Partial weight bearing RLE Partial Weight Bearing Percentage or Pounds: 50%    Mobility  Bed Mobility Overal bed mobility: Needs Assistance Bed Mobility: Supine to Sit;Sit to Supine     Supine to sit: Min assist Sit to supine: Min assist   General bed mobility comments: assist for R LE  Transfers Overall transfer level: Needs assistance Equipment used: Rolling walker (2 wheeled) Transfers: Sit to/from Stand Sit to Stand: Min guard         General transfer comment: verbal cues for safety and maintaining hip precautions  Ambulation/Gait Ambulation/Gait assistance: Min guard Ambulation Distance (Feet): 28 Feet Assistive device: Rolling walker (2 wheeled) Gait Pattern/deviations: Step-to pattern;Decreased stance time - right;Antalgic     General Gait Details: verbal cues for maintaining precautions, pt's pain appears improved today compared to last session, pt only agreeable to ambulate within room   Stairs            Wheelchair Mobility    Modified  [place] (Stroke Patients Only)       Balance                                            Cognition Arousal/Alertness: Awake/alert Behavior During Therapy: WFL for tasks assessed/performed                                   General Comments: constant cues for posterior hip precautions, slightly impulsive      Exercises      General Comments        Pertinent Vitals/Pain Pain Assessment: Faces Faces Pain Scale: Hurts even more Pain Location: R hip Pain Descriptors / Indicators: Aching Pain Intervention(s): Limited activity within patient's tolerance;Repositioned;Monitored during session;Premedicated before session    Home Living                      Prior Function            PT Goals (current goals can now be found in the care plan section) Progress towards PT goals: Progressing toward goals    Frequency    7X/week      PT Plan Discharge plan needs to be updated    Co-evaluation              AM-PAC PT "6 Clicks" Daily Activity  Outcome Measure  Difficulty turning over in bed (including adjusting bedclothes, sheets  and blankets)?: A Little Difficulty moving from lying on back to sitting on the side of the bed? : Unable Difficulty sitting down on and standing up from a chair with arms (e.g., wheelchair, bedside commode, etc,.)?: Unable Help needed moving to and from a bed to chair (including a wheelchair)?: A Little Help needed walking in hospital room?: A Little Help needed climbing 3-5 steps with a railing? : A Lot 6 Click Score: 13    End of Session   Activity Tolerance: Patient limited by pain Patient left: with call bell/phone within reach;in bed;with bed alarm set Nurse Communication: Mobility status;Patient requests pain meds PT Visit Diagnosis: Other abnormalities of gait and mobility (R26.89)     Time: 4403-4742 PT Time Calculation (min) (ACUTE ONLY): 15 min  Charges:  $Gait Training: 8-22  mins                    G Codes:       Carmelia Bake, PT, DPT 05/14/2017 Pager: [phone]   York Ram E 05/14/2017, 12:47 PM

## 2017-05-15 DIAGNOSIS — B9561 Methicillin susceptible Staphylococcus aureus infection as the cause of diseases classified elsewhere: Secondary | ICD-10-CM

## 2017-05-15 DIAGNOSIS — Z7901 Long term (current) use of anticoagulants: Secondary | ICD-10-CM

## 2017-05-15 LAB — C-REACTIVE PROTEIN: CRP: <0.8 mg/dL (ref <1.0)

## 2017-05-15 LAB — SEDIMENTATION RATE: Sed Rate: 80 mm/hr — ABNORMAL HIGH (ref 0–22)

## 2017-05-15 MED ORDER — CYCLOBENZAPRINE HCL 10 MG PO TABS: 10.0000 mg | ORAL_TABLET | Freq: Two times a day (BID) | ORAL | 0 refills | Status: DC | PRN

## 2017-05-15 MED ORDER — FERROUS SULFATE 325 (65 FE) MG PO TABS: 325.0000 mg | ORAL_TABLET | Freq: Three times a day (TID) | ORAL | 3 refills | Status: DC

## 2017-05-15 MED ORDER — OXYCODONE HCL 10 MG PO TABS: 10.0000 mg | ORAL_TABLET | ORAL | 0 refills | Status: DC | PRN

## 2017-05-15 MED ORDER — ALPRAZOLAM 1 MG PO TABS: 1.0000 mg | ORAL_TABLET | Freq: Two times a day (BID) | ORAL | 0 refills | Status: AC | PRN

## 2017-05-15 MED ORDER — POLYETHYLENE GLYCOL 3350 17 G PO PACK: 17.0000 g | PACK | Freq: Two times a day (BID) | ORAL | 0 refills | Status: DC

## 2017-05-15 MED ORDER — CEFAZOLIN SODIUM-DEXTROSE 2-4 GM/100ML-% IV SOLN
2.0000 g | Freq: Three times a day (TID) | INTRAVENOUS | Status: DC
  Administered 2017-05-15 – 2017-05-22 (×21): 2 g via INTRAVENOUS
  Filled 2017-05-15 (×23): qty 100

## 2017-05-15 MED ORDER — DOCUSATE SODIUM 100 MG PO CAPS: 100.0000 mg | ORAL_CAPSULE | Freq: Two times a day (BID) | ORAL | 0 refills | Status: DC

## 2017-05-15 MED ORDER — RIVAROXABAN 10 MG PO TABS: 10.0000 mg | ORAL_TABLET | ORAL | 0 refills | Status: DC

## 2017-05-15 MED ORDER — CEFAZOLIN IV (FOR PTA / DISCHARGE USE ONLY): 2.0000 g | Freq: Three times a day (TID) | INTRAVENOUS | 0 refills | Status: DC

## 2017-05-15 MED ORDER — NICOTINE 14 MG/24HR TD PT24: 14.0000 mg | MEDICATED_PATCH | Freq: Every day | TRANSDERMAL | 0 refills | Status: DC

## 2017-05-15 NOTE — Care Management Important Message (Signed)
Important Message  Patient Details IM Letter given to Nora/Case Manager to present to the Patient Name: [name] MRN: [id number] Date of Birth: 10-07-1960   Medicare Important Message Given:  Yes    Kerin Salen 05/15/2017, 10:51 AMImportant Message  Patient Details  Name: [name] MRN: [id number] Date of Birth: Apr 11, 1961   Medicare Important Message Given:  Yes    Kerin Salen 05/15/2017, 10:51 AM

## 2017-05-15 NOTE — Discharge Summary (Addendum)
Physician Discharge Summary  Patient ID: [name] MRN: [id number] DOB/AGE: Sep 18, 1960 56 y.o.  Admit date: 05/12/2017 Discharge date:  05/22/2017  Procedures:  Procedure(s) (LRB): Right total hip resection WITH ANTIBIOTIC SPACER (Right)  Attending Physician:  Dr. Paralee Cancel   Admission Diagnoses:   S/P resection of right THA and placement of antibiotic spacer  Discharge Diagnoses:  Principal Problem:   Right hip spacer / infection  Past Medical History:  Diagnosis Date  . Anxiety   . Bipolar affective disorder ([place])    ADHD AND SCHZIOPHRENIC--PT GOES TO [place]. BLANKMAN  . Blood transfusion without reported diagnosis   . Cervical radiculopathy   . Chronic pain syndrome   . Complication of anesthesia    STATES SHE WOKE UP TWICE DURING SURGERIES  . DJD (degenerative joint disease)   . GERD (gastroesophageal reflux disease)   . H/O hiatal hernia   . Hepatitis C infection    STATES COMPLETED TREATMENT AND NO LONGER HAS INFECTION  . Hypertension   . Leukocytopenia   . Osteoarthritis   . Osteoporosis   . Peptic ulcer disease   . Pneumonia 2013  . Polysubstance (excluding opioids) dependence ([place])   . Seizures (HCC)    SEIZURE AFTER BREATHING TX (ALBUTEROL) IN ER; ALSO PAST HX SEIZURES DURING DRUG DETOX  . Shortness of breath    WITH EXERTION; SMOKER  . Thrombocytopenia (HCC)     HPI:    Pt is a 56 y.o. female complaining of right hip pain for 3 months. Pain had continually increased since the beginning. X-rays in the clinic show previous THA of the right hip. Pt has tried various conservative treatments which have failed to alleviate their symptoms, including NSAIDs, analgesic medications, activity modification and use of assistance devices. Various options are discussed with the patient. Risks, benefits and expectations were discussed with the patient. Patient understand the risks, benefits and expectations and wishes to proceed with  surgery.  PCP: System, Pcp Not In   Discharged Condition: fair  Hospital Course:  Patient underwent the above stated procedure on 05/12/2017. Patient tolerated the procedure well and brought to the recovery room in good condition and subsequently to the floor.  POD #1 BP: 130/68 ; Pulse: 84 ; Temp: 97.8 F (36.6 C) ; Resp: 15 Patient reports pain as moderate, pain controlled.  States most of her pain is in the outside of the thigh from the hip to the knee. We discussed a muscle relaxer to help the pain. Also discussed that she should get a PICC line today and we will call ID to get the involved. Dorsiflexion/plantar flexion intact, incision: dressing C/D/I, no cellulitis present and compartment soft.   LABS  Basename    HGB     9.4  HCT     29.7   POD #2  BP: 124/61 ; Pulse: 91 ; Temp: 98 F (36.7 C) ; Resp: 16 Patient reports pain as moderate, not fully controlled. No events throughout the night otherwise.  Dorsiflexion/plantar flexion intact, incision: dressing C/D/I, no cellulitis present and compartment soft.   LABS  Basename    HGB     7.8  HCT     24.8   POD #3  BP: 144/68 ; Pulse: 91 ; Temp: 98.1 F (36.7 C) ; Resp: 14 Patient reports pain as mild, pain controlled. No events throughout the night. Ready to be discharged to SNF after receiving final recommendations from ID. Dorsiflexion/plantar flexion intact, incision: dressing C/D/I,  no cellulitis present and compartment soft.   LABS   No new labs  POD #4 - #10 Vital signs stable. Pain was controlled.  No major events.  Very slowly working with PT.  Awaiting d/c to SNF.   Discharge Exam: General appearance: alert, cooperative and no distress Extremities: Homans sign is negative, no sign of DVT, no edema, redness or tenderness in the calves or thighs and no ulcers, gangrene or trophic changes  Disposition:    Skilled nursing facility with follow up in 2 weeks    Contact information for follow-up providers     Paralee Cancel, MD. Schedule an appointment as soon as possible for a visit in 1 week(s).   Specialty:  Orthopedic Surgery Contact information: [address] [place] [address] [phone]            Contact information for after-discharge care    [facility] Follow up.   Service:  Skilled Nursing Contact information: [address] [place] [phone]                  Discharge Instructions    Call MD / Call 911   Complete by:  As directed    If you experience chest pain or shortness of breath, CALL 911 and be transported to the hospital emergency room.  If you develope a fever above 101 F, pus (white drainage) or increased drainage or redness at the wound, or calf pain, call your surgeon's office.   Change dressing   Complete by:  As directed    Maintain surgical dressing until follow up in the clinic. If the edges start to pull up, may reinforce with tape. If the dressing is no longer working, may remove and cover with gauze and tape, but must keep the area dry and clean.  Call with any questions or concerns.   Constipation Prevention   Complete by:  As directed    Drink plenty of fluids.  Prune juice may be helpful.  You may use a stool softener, such as Colace (over the counter) 100 mg twice a day.  Use MiraLax (over the counter) for constipation as needed.   Diet - low sodium heart healthy   Complete by:  As directed    Discharge instructions   Complete by:  As directed    Maintain surgical dressing until follow up in the clinic. If the edges start to pull up, may reinforce with tape. If the dressing is no longer working, may remove and cover with gauze and tape, but must keep the area dry and clean.  Follow up in 2 weeks at [facility]. Call with any questions or concerns.   Home infusion instructions [facility] May follow [place] Dosing Protocol; May administer  Cathflo as needed to maintain patency of vascular access device.; Flushing of vascular access device: per [facility] Protocol: 0.9% NaCl pre/post medica...   Complete by:  As directed    Instructions:  May follow [place] Dosing Protocol   Instructions:  May administer Cathflo as needed to maintain patency of vascular access device.   Instructions:  Flushing of vascular access device: per [facility] Protocol: 0.9% NaCl pre/post medication administration and prn patency; Heparin 100 u/ml, 71m for implanted ports and Heparin 10u/ml, 546mfor all other central venous catheters.   Instructions:  May follow [facility] Anaphylaxis Protocol for First Dose Administration in the home: 0.9% NaCl at 25-50 ml/hr to  maintain IV access for protocol meds. Epinephrine 0.3 ml IV/IM PRN and Benadryl 25-50 IV/IM PRN s/s of anaphylaxis.   Instructions:  [place] Infusion Coordinator (RN) to assist per patient IV care needs in the home PRN.   Partial weight bearing   Complete by:  As directed    % Body Weight:  50   Laterality:  right   Extremity:  Lower   TED hose   Complete by:  As directed    Use stockings (TED hose) for 2 weeks on both leg(s).  You may remove them at night for sleeping.      Allergies as of 05/22/2017      Reactions   Albuterol Other (See Comments)   PT states she had seizures from an albuterol treatment and was hospitalized for it   Aspirin Hives   Vomits blood   Adhesive [tape] Rash      Medication List    STOP taking these medications   acetaminophen 325 MG tablet Commonly known as:  TYLENOL   ibuprofen 200 MG tablet Commonly known as:  ADVIL,MOTRIN   oxyCODONE-acetaminophen 5-325 MG tablet Commonly known as:  PERCOCET   pantoprazole 40 MG tablet Commonly known as:  PROTONIX     TAKE these medications   ALPRAZolam 1 MG tablet Commonly known as:  XANAX Take 1 tablet (1 mg total) by mouth 2 (two) times daily as needed for anxiety.   amphetamine-dextroamphetamine 20 MG  tablet Commonly known as:  ADDERALL Take 20 mg by mouth 3 (three) times daily.   ceFAZolin IVPB Commonly known as:  ANCEF Inject 2 g into the vein every 8 (eight) hours. Indication:  Prosthetic joint infection Last Day of Therapy:  06/22/17 Labs - Once weekly:  CBC/D and BMP, Labs - Every other week:  ESR and CRP   cyclobenzaprine 10 MG tablet Commonly known as:  FLEXERIL Take 1 tablet (10 mg total) by mouth 2 (two) times daily as needed for muscle spasms.   docusate sodium 100 MG capsule Commonly known as:  COLACE Take 1 capsule (100 mg total) by mouth 2 (two) times daily.   ferrous sulfate 325 (65 FE) MG tablet Take 1 tablet (325 mg total) by mouth 3 (three) times daily after meals.   lisinopril 5 MG tablet Commonly known as:  PRINIVIL,ZESTRIL Take 1 tablet (5 mg total) by mouth daily.   nicotine 14 mg/24hr patch Commonly known as:  NICODERM CQ - dosed in mg/24 hours Place 1 patch (14 mg total) onto the skin daily.   Oxycodone HCl 10 MG Tabs Take 1-1.5 tablets (10-15 mg total) by mouth every 4 (four) hours as needed for moderate pain ((score 1 to 5)). What changed:    medication strength  how much to take  when to take this  reasons to take this  additional instructions   polyethylene glycol packet Commonly known as:  MIRALAX / GLYCOLAX Take 17 g by mouth 2 (two) times daily.   QUEtiapine 200 MG tablet Commonly known as:  SEROQUEL Take 200 mg by mouth at bedtime as needed (mood, agitation).   rivaroxaban 10 MG Tabs tablet Commonly known as:  XARELTO Take 1 tablet (10 mg total) by mouth daily for 14 days.            Home Infusion Instuctions  (From admission, onward)        Start     Ordered   05/15/17 0000  Home infusion instructions [facility] May follow [facility]  [facility]arm[facility] Dosing Protocol; May administer Cathflo as needed to maintain patency of vascular access device.; Flushing of vascular access device: per [facility] Protocol: 0.9% NaCl pre/post  medica...    Question Answer Comment  Instructions May follow [place] Dosing Protocol   Instructions May administer Cathflo as needed to maintain patency of vascular access device.   Instructions Flushing of vascular access device: per [facility] Protocol: 0.9% NaCl pre/post medication administration and prn patency; Heparin 100 u/ml, 73m for implanted ports and Heparin 10u/ml, 543mfor all other central venous catheters.   Instructions May follow [facility] Anaphylaxis Protocol for First Dose Administration in the home: 0.9% NaCl at 25-50 ml/hr to maintain IV access for protocol meds. Epinephrine 0.3 ml IV/IM PRN and Benadryl 25-50 IV/IM PRN s/s of anaphylaxis.   Instructions [facility] Infusion Coordinator (RN) to assist per patient IV care needs in the home PRN.      05/15/17 1248       Discharge Care Instructions  (From admission, onward)        Start     Ordered   05/15/17 0000  Change dressing    Comments:  Maintain surgical dressing until follow up in the clinic. If the edges start to pull up, may reinforce with tape. If the dressing is no longer working, may remove and cover with gauze and tape, but must keep the area dry and clean.  Call with any questions or concerns.   05/15/17 1544   05/15/17 0000  Partial weight bearing    Question Answer Comment  % Body Weight 50   Laterality right   Extremity Lower      05/15/17 1544       Signed: MaWest PughBabish   PA-C  05/22/2017, 7:40 AM

## 2017-05-15 NOTE — Op Note (Signed)
NAME:  [name], [name]                      ACCOUNT NO.:  MEDICAL RECORD NO.:  [id number]  LOCATION:                                 FACILITY:  PHYSICIAN:  Pietro Cassis. Alvan Dame, M.D.  DATE OF BIRTH:  [birth year]  DATE OF PROCEDURE:  05/12/2017 DATE OF DISCHARGE:                              OPERATIVE REPORT   PREOPERATIVE DIAGNOSIS:  Infected right total hip arthroplasty.  POSTOPERATIVE DIAGNOSIS:  Infected right total hip arthroplasty.  PROCEDURE:  Resection of right hip acetabular component with excisional and nonexcisional debridement with placement of a cemented acetabular liner and new femoral head retaining the femoral stem as described below.  SURGEON:  Pietro Cassis. Alvan Dame, M.D.  ASSISTANT:  Danae Orleans, PA-C.  Note that Mr. Guinevere Scarlet was present for the entirety of the case from preoperative position, perioperative management of the operative extremity, general facilitation of the case and primary wound closure.  ANESTHESIA:  General.  BLOOD LOSS:  About 1200 mL.  BLOOD ADMINISTERED:  2 units of packed red blood cells due to low starting hemoglobin; otherwise, she remained stable hemodynamically.  DRAINS:  None.  COMPLICATION:  None.  INDICATIONS FOR PROCEDURE:  [name] is a 56 year old female with history of bilateral total hip arthroplasties.  Her right hip replacement was performed about 10 years ago.  She had a metal modular Pinnacle hip system.  She had contacted me about 6-8 weeks ago with increasing pain in the right hip.  When she was seen back in town, we began a barrage of test to evaluate the source of this pain including metal ion levels as well as ruling out infection.  An MRI was ordered, revealing large fluid collection.  The sedimentation rate and C-reactive protein indicated concern for infection.  We subsequently had her right hip aspirated, which revealed Staphylococcus aureus colonization.  For this reason, this was deemed to be an infection as  opposed to any issue regarding metal on metal.  The need for surgery was discussed.  Risks and benefits of potential need for further surgeries including reimplantation, further resection were all reviewed.  The standard risks of neurovascular compromise, DVT, dislocation were all reviewed. Consent was obtained for benefit of pain management as well as treatment of this infection.  PROCEDURE IN DETAIL:  The patient was brought to the operative theater. Once adequate anesthesia, preoperative antibiotics initially held, but once incision was made, vancomycin and Ancef were administered intraoperatively.  She was positioned in the left lateral decubitus position with the right side up.  The right lower extremity was then prepped and draped in sterile fashion.  A time-out was performed identifying the patient, planned procedure and extremity.  Her old incision was identified and extended slightly proximal and distal for exposure purposes.  Soft tissue dissection was carried down to the iliotibial band and gluteal fascia.  This was then incised.  Once I got into this layer, we did identify a purulent and inflamed fluid, which was cultured.  At this point, exposure of the posterior aspect of the hip was carried out excising scar tissue as well as metal-stained tissues. Once I had the posterior two-thirds of the  hip exposed, we dislocated the hip.  We then noted that the acetabulum was loose preoperatively on bone scan and confirmed intraoperatively as well.  I was able to remove the metal liner and then removed a single screw by pulling out.  I then removed the cup itself pulling out a second screw.  At this point, we identified further metal staining within the acetabulum.  Further debridements were carried out around the hip as necessary and to perform a significant excisional debridement around this area including nonviable-appearing tissues and metal-stained tissues.  I then reamed to a  52-mm reamer, preparing the acetabulum as much as possible.  At this point, I made a decision.  I felt that the affected joint space was closed adequately by her femoral stem, which was not loose by bone scan preoperatively and not loose intraoperatively.  We irrigated the hip with 6 L of normal saline at this point.  Once this was done, we opened up a cemented liner from the Prostalac system with DePuy.  This was the inner diameter 32 mm x 46 mm liner.  We then mixed a single batch of cement with a little bit more monomer with 2 g of vancomycin and 2.4 of tobramycin.  Once the cement was ready, the final acetabular component was cemented in place and held in position until the cement fully cured, trying to reproduce the appropriate abduction and anteversion. Excessive cement was removed around this time.  Once this was fully secured, we were confident that the acetabular shell was stable with the cement.  We selected a 32+ 5 ball and then reduced the hip.  We re- irrigated the hip again with another 3 L of normal saline solution. Based on the extensive debridement, there was no capsular tissue to repair.  Further excisional debridements were carried out as necessary on the way out.  The iliotibial band and gluteal fascia were then reapproximated using #1 PDS sutures, the remainder of the wound was closed with 2-0 Vicryl and staples on the skin.  The skin was cleaned, dried and dressed sterilely using a Mepilex dressing.  She was then woken from anesthesia and brought to the recovery room in stable condition tolerating the procedure well.  Infectious Disease will be consulted.  She will be partial weightbearing on this right hip.  The determination for future surgical will be based on her recovery and labs restoration to normal.     Pietro Cassis. Alvan Dame, M.D.     MDO/MEDQ  D:  05/14/2017  T:  05/15/2017  Job:  [id number]

## 2017-05-15 NOTE — Clinical Social Work Placement (Addendum)
PASRR pending.  4:49:PASRR still pending.   CLINICAL SOCIAL WORK PLACEMENT  NOTE  Date:  05/15/2017  Patient Details  Name: [name] MRN: [id number] Date of Birth: 18-Apr-1961  Clinical Social Work is seeking post-discharge placement for this patient at the [place] level of care (*CSW will initial, date and re-position this form in  chart as items are completed):  Yes   Patient/family provided with [facility] Work Department's list of facilities offering this level of care within the geographic area requested by the patient (or if unable, by the patient's family).  Yes   Patient/family informed of their freedom to choose among providers that offer the needed level of care, that participate in Medicare, Medicaid or managed care program needed by the patient, have an available bed and are willing to accept the patient.  Yes   Patient/family informed of [facility]'s ownership interest in [facility] and [facility], as well as of the fact that they are under no obligation to receive care at these facilities.  PASRR submitted to EDS on 05/15/17     PASRR number received on       Existing PASRR number confirmed on       FL2 transmitted to all facilities in geographic area requested by pt/family on       FL2 transmitted to all facilities within larger geographic area on 05/14/17     Patient informed that his/her managed care company has contracts with or will negotiate with certain facilities, including the following:  [facility]     Yes   Patient/family informed of bed offers received.  Patient chooses bed at [facility]     Physician recommends and patient chooses bed at      Patient to be transferred to [facility] on 05/15/17.  Patient to be transferred to facility by [facility]AR     Patient family notified on 05/15/17 of transfer.  Name of family member notified:  Patient to notify family.       PHYSICIAN       Additional Comment:    _______________________________________________ Lia Hopping, LCSW 05/15/2017, 9:53 AM

## 2017-05-15 NOTE — Progress Notes (Signed)
     Subjective: 3 Days Post-Op Procedure(s) (LRB): Right total hip resection WITH ANTIBIOTIC SPACER (Right)   Patient reports pain as mild, pain controlled. No events throughout the night. Ready to be discharged to SNF after receiving final recommendations from ID.  Objective:   VITALS:   Vitals:   05/14/17 2005 05/15/17 0549  BP: (!) 118/57 (!) 144/68  Pulse: 98 91  Resp: 15 14  Temp: 98.2 F (36.8 C) 98.1 F (36.7 C)  SpO2: 98% 100%    Dorsiflexion/Plantar flexion intact Incision: dressing C/D/I No cellulitis present Compartment soft  LABS Recent Labs    05/13/17 0536 05/14/17 0426  HGB 9.4* 7.8*  HCT 29.7* 24.8*  WBC 8.3 5.6  PLT 201 165    Recent Labs    05/13/17 0536 05/14/17 0426  NA 136 139  K 4.2 4.0  BUN 8 10  CREATININE 0.64 0.76  GLUCOSE 111* 112*     Assessment/Plan: 3 Days Post-Op Procedure(s) (LRB): Right total hip resection WITH ANTIBIOTIC SPACER (Right) Dressing changed to Aquacel Up with therapy Discharge to SNF  Follow up in 2 weeks at [facility]. Follow up with OLIN,Gurpreet Mikhail D in 2 weeks.  Contact information:  [facility] [address], [address] [place] Kathrene Sinopoli   PAC  05/15/2017, 8:02 AM

## 2017-05-15 NOTE — Progress Notes (Addendum)
12/21: [facility] ED CSW received handoff for the pt and was informed by daytime CSW that pt is awaiting a PASSR # and that once the PASSR is obtained the pt can be D/C'd via [facility] to [facility].  CSW must provide PASSR to [facility] SNF before D/C.  CSW will monitor [place] MUST for PASSR #.  Of note: CSW please include on Med Necessity Transport Form and while calling PT[facility] that the pt will require IV maintenance due to the pt having an IV.  CSW will continue to follow for D/C needs.  11:10 PM CSW continually checked [place] MUST but sees no notification for the pt's PASSR number being assigned.  CSW cannot see by checking/tracking that the pt's screening has even been submitted and this writer/CSW attempted to apply for a PASSR himself but [place] MUST stated the information submitted by this writer (from pt's chart) did not match up with [place] MUST's information.  Per chart, pt's name is [name], SS# [id number] and DOB: May 16, 1961. This info did not allow this CSW to attempt new screen.  CSW will leave handoff for AM CSW for Saturday 12/21.   Alphonse Guild. Chrisoula Zegarra, LCSW, LCAS, CSI Clinical Social Worker Ph: [phone]

## 2017-05-15 NOTE — Progress Notes (Addendum)
    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    Plan: 1.  Now MSSA so will change to cefazolin for 6 weeks total from OR date through January 28th; will not use rifampin on Xarelto 2. Will place OPAT consult for appropriate labs, doses; SNF can fax copy of lab results to [phone] 3. Will follow up with me in 2-3 weeks

## 2017-05-16 LAB — BODY FLUID CULTURE

## 2017-05-16 NOTE — Progress Notes (Signed)
PT Cancellation Note  Patient Details Name: [name] MRN: [id number] DOB: 1961-01-13   Cancelled Treatment:     Attempted once in am, pt stated she was tired right now.  Unable to return later.  Pt has been evaluated with rec for SNF.  Awaiting lvcel 11 PASAR #    Rica Koyanagi  PTA [facility]  Acute  Rehab Pager      [phone]

## 2017-05-16 NOTE — Progress Notes (Signed)
     Subjective: 4 Days Post-Op Procedure(s) (LRB): Right total hip resection WITH ANTIBIOTIC SPACER (Right)   Patient reports pain as mild, pain controlled. No events throughout the night. Ready to be discharged to SNF after receiving final recommendations from ID.  Objective:   VITALS:   Vitals:   05/15/17 2210 05/16/17 0544  BP: (!) 158/85 123/63  Pulse: 78 90  Resp: 15 18  Temp: 98.1 F (36.7 C) 98 F (36.7 C)  SpO2: 98% 99%    Dorsiflexion/Plantar flexion intact Incision: dressing C/D/I No cellulitis present Compartment soft  LABS Recent Labs    05/14/17 0426  HGB 7.8*  HCT 24.8*  WBC 5.6  PLT 165    Recent Labs    05/14/17 0426  NA 139  K 4.0  BUN 10  CREATININE 0.76  GLUCOSE 112*     Assessment/Plan: 4 Days Post-Op Procedure(s) (LRB): Right total hip resection WITH ANTIBIOTIC SPACER (Right) Dressing changed to Aquacel Up with therapy Discharge to SNF  Follow up in 2 weeks at [facility]. Follow up with OLIN,MATTHEW D in 2 weeks.  Contact information:  [facility] [address], [place] [phone]          05/16/2017, 8:34 AM

## 2017-05-17 ENCOUNTER — Inpatient Hospital Stay (HOSPITAL_COMMUNITY): Payer: Medicare Other

## 2017-05-17 LAB — ANAEROBIC CULTURE

## 2017-05-17 NOTE — Progress Notes (Signed)
    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   Denies CP or SOB.  Voiding without difficulty. Positive BM yesterday. Pt reports abd pain that has been present for 4 days. Pt was unable to work with PT yesterday.  Objective: Vital signs in last 24 hours: Temp:  [97.5 F (36.4 C)-98.4 F (36.9 C)] 98.4 F (36.9 C) (12/23 0544) Pulse Rate:  [83-91] 83 (12/23 0544) Resp:  [16-19] 19 (12/23 0544) BP: (133-147)/(57-89) 133/89 (12/23 0544) SpO2:  [100 %] 100 % (12/23 0544)  Intake/Output from previous day: 12/22 0701 - 12/23 0700 In: 1080 [P.O.:780; IV Piggyback:300] Out: -  Intake/Output this shift: No intake/output data recorded.  Labs: No results for input(s): HGB in the last 72 hours. No results for input(s): WBC, RBC, HCT, PLT in the last 72 hours. No results for input(s): NA, K, CL, CO2, BUN, CREATININE, GLUCOSE, CALCIUM in the last 72 hours. No results for input(s): LABPT, INR in the last 72 hours.  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 Melina Schools [facility] [phone] 05/17/2017, 8:05 AM

## 2017-05-17 NOTE — Progress Notes (Signed)
Physical Therapy Treatment Patient Details Name: [name] MRN: [id number] DOB: [birth year] Today's Date: 05/17/2017    History of Present Illness Pt is a 56 year old female s/p Right total hip resection with antibiotic spacer    PT Comments    POD # 5 Pt c/o a lot ABD pain.  Max encouragement to amb a greater distance in hallway.  Assisted to bathroom then back to bed per pt request.    Follow Up Recommendations  SNF     Equipment Recommendations  Rolling walker with 5" wheels    Recommendations for Other Services       Precautions / Restrictions Precautions Precautions: Posterior Hip Precaution Comments: reviewed posterior THPs and PWB Restrictions Weight Bearing Restrictions: Yes RLE Weight Bearing: Partial weight bearing RLE Partial Weight Bearing Percentage or Pounds: 50%    Mobility  Bed Mobility Overal bed mobility: Needs Assistance Bed Mobility: Supine to Sit;Sit to Supine     Supine to sit: Supervision;Min guard Sit to supine: Supervision;Min guard   General bed mobility comments: able to self perform  Transfers Overall transfer level: Needs assistance Equipment used: Rolling walker (2 wheeled) Transfers: Sit to/from [facility] Sit to Stand: Supervision Stand pivot transfers: Supervision       General transfer comment: one VC safety with turn completion and to advance R LE to adhere to THP with stand to sit  Ambulation/Gait Ambulation/Gait assistance: Min guard;Supervision Ambulation Distance (Feet): 65 Feet Assistive device: Rolling walker (2 wheeled) Gait Pattern/deviations: Step-to pattern;Decreased stance time - right;Antalgic Gait velocity: decreased   General Gait Details: tolerated an increased distance.  One VC safety with turns.  Adhering to [facility].  Minimal hip pain but MAX ABD pain   Stairs            Wheelchair Mobility    Modified Rankin (Stroke Patients Only)       Balance                                             Cognition Arousal/Alertness: Awake/alert Behavior During Therapy: WFL for tasks assessed/performed Overall Cognitive Status: Within Functional Limits for tasks assessed                                 General Comments: increased safety cognition and adhering to THP and PWB      Exercises      General Comments        Pertinent Vitals/Pain Pain Assessment: Faces Faces Pain Scale: Hurts even more Pain Location: severe ABD pain Pain Descriptors / Indicators: Constant Pain Intervention(s): Monitored during session    Home Living                      Prior Function            PT Goals (current goals can now be found in the care plan section) Progress towards PT goals: Progressing toward goals    Frequency    7X/week      PT Plan Current plan remains appropriate    Co-evaluation              AM-PAC PT "6 Clicks" Daily Activity  Outcome Measure  Difficulty turning over in bed (including adjusting bedclothes, sheets and blankets)?: A Little Difficulty moving from lying  on back to sitting on the side of the bed? : Unable Difficulty sitting down on and standing up from a chair with arms (e.g., wheelchair, bedside commode, etc,.)?: Unable Help needed moving to and from a bed to chair (including a wheelchair)?: A Little Help needed walking in hospital room?: A Little Help needed climbing 3-5 steps with a railing? : A Lot 6 Click Score: 13    End of Session Equipment Utilized During Treatment: Gait belt Activity Tolerance: Patient tolerated treatment well Patient left: in bed;with call bell/phone within reach Nurse Communication: Mobility status;Patient requests pain meds PT Visit Diagnosis: Other abnormalities of gait and mobility (R26.89)     Time: 4332-9518 PT Time Calculation (min) (ACUTE ONLY): 25 min  Charges:  $Gait Training: 8-22 mins $Therapeutic Activity: 8-22 mins                     G Codes:       Rica Koyanagi  PTA [facility]  Acute  Rehab Pager      [phone]

## 2017-05-17 NOTE — Progress Notes (Signed)
Physical Therapy Treatment Patient Details Name: [name] MRN: [id number] DOB: May 26, 1961 Today's Date: 05/17/2017    History of Present Illness Pt is a 56 year old female s/p Right total hip resection with antibiotic spacer    PT Comments    POD # 5 pm session Assisted with a second walk in hallway tolerated an increased distance.  Does well to adhere to her PWB.  Decreased c/o ABD pain.    Follow Up Recommendations  SNF     Equipment Recommendations  Rolling walker with 5" wheels    Recommendations for Other Services       Precautions / Restrictions Precautions Precautions: Posterior Hip Precaution Comments: reviewed posterior THPs and PWB Restrictions Weight Bearing Restrictions: Yes RLE Weight Bearing: Partial weight bearing RLE Partial Weight Bearing Percentage or Pounds: 50%    Mobility  Bed Mobility Overal bed mobility: Needs Assistance Bed Mobility: Supine to Sit;Sit to Supine     Supine to sit: Supervision;Min guard Sit to supine: Supervision;Min guard   General bed mobility comments: able to self perform  Transfers Overall transfer level: Needs assistance Equipment used: Rolling walker (2 wheeled) Transfers: Sit to/from [facility] Sit to Stand: Supervision Stand pivot transfers: Supervision       General transfer comment: one VC safety with turn completion and to advance R LE to adhere to THP with stand to sit  Ambulation/Gait Ambulation/Gait assistance: Min guard;Supervision Ambulation Distance (Feet): 85 Feet Assistive device: Rolling walker (2 wheeled) Gait Pattern/deviations: Step-to pattern;Decreased stance time - right;Antalgic Gait velocity: decreased   General Gait Details: tolerated an increased distance.  One VC safety with turns.  Adhering to [facility].  Minimal hip pain   Stairs            Wheelchair Mobility    Modified Rankin (Stroke Patients Only)       Balance                                             Cognition Arousal/Alertness: Awake/alert Behavior During Therapy: WFL for tasks assessed/performed Overall Cognitive Status: Within Functional Limits for tasks assessed                                 General Comments: increased safety cognition and adhering to THP and PWB      Exercises      General Comments        Pertinent Vitals/Pain Pain Assessment: Faces Faces Pain Scale: Hurts even more Pain Location: severe ABD pain Pain Descriptors / Indicators: Constant Pain Intervention(s): Monitored during session    Home Living                      Prior Function            PT Goals (current goals can now be found in the care plan section) Progress towards PT goals: Progressing toward goals    Frequency    7X/week      PT Plan Current plan remains appropriate    Co-evaluation              AM-PAC PT "6 Clicks" Daily Activity  Outcome Measure  Difficulty turning over in bed (including adjusting bedclothes, sheets and blankets)?: A Little Difficulty moving from lying on back to sitting on  the side of the bed? : Unable Difficulty sitting down on and standing up from a chair with arms (e.g., wheelchair, bedside commode, etc,.)?: Unable Help needed moving to and from a bed to chair (including a wheelchair)?: A Little Help needed walking in hospital room?: A Little Help needed climbing 3-5 steps with a railing? : A Lot 6 Click Score: 13    End of Session Equipment Utilized During Treatment: Gait belt Activity Tolerance: Patient tolerated treatment well Patient left: in bed;with call bell/phone within reach Nurse Communication: Mobility status;Patient requests pain meds PT Visit Diagnosis: Other abnormalities of gait and mobility (R26.89)     Time: 1975-8832 PT Time Calculation (min) (ACUTE ONLY): 15 min  Charges:  $Gait Training: 8-22 mins $Therapeutic Activity: 8-22 mins                    G Codes:        {Zandria Woldt  PTA [facility]  Acute  Rehab Pager      [phone]

## 2017-05-18 NOTE — Progress Notes (Signed)
LCSW following for SNF placement.  Patient does not have PASRR. Patient cannot go to any facility without a PASRR. [facility] is closed until 12/27.  Patient and family has chosen bed at [facility].   LCSw will continue to follow.   [name]

## 2017-05-18 NOTE — Progress Notes (Signed)
Patient ID: [name], female   DOB: 02-27-1961, 56 y.o.   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  Doing fairly overall given situation.  Waiting for SNF placement  Objective:   VITALS:   Vitals:   05/17/17 2049 05/18/17 0529  BP: (!) 147/68 (!) 120/45  Pulse: 84 90  Resp: 17 15  Temp: 98.6 F (37 C) 98.4 F (36.9 C)  SpO2: 100% 100%    Neurovascular intact Incision: dressing C/D/I  LABS No results for input(s): HGB, HCT, WBC, PLT in the last 72 hours.  No results for input(s): NA, K, BUN, CREATININE, GLUCOSE in the last 72 hours.  No results for input(s): LABPT, INR in the last 72 hours.   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

## 2017-05-18 NOTE — Progress Notes (Signed)
Physical Therapy Treatment Patient Details Name: [name] MRN: [id number] DOB: 03/30/1961 Today's Date: 05/18/2017    History of Present Illness Pt is a 56 year old female s/p Right total hip resection with antibiotic spacer    PT Comments    Assisted OOB to amb to bathroom then in hallway.  Pt adhering to her PWB and requires <25% VC's to adhere to THP as pt can be impulsive.   Follow Up Recommendations  SNF     Equipment Recommendations  Rolling walker with 5" wheels    Recommendations for Other Services       Precautions / Restrictions Precautions Precautions: Posterior Hip Precaution Comments: reviewed posterior THPs and PWB Restrictions Weight Bearing Restrictions: Yes RLE Weight Bearing: Partial weight bearing RLE Partial Weight Bearing Percentage or Pounds: 50%    Mobility  Bed Mobility Overal bed mobility: Needs Assistance       Supine to sit: Supervision Sit to supine: Supervision   General bed mobility comments: able to self perform  Transfers Overall transfer level: Needs assistance Equipment used: Rolling walker (2 wheeled) Transfers: Sit to/from [facility] Sit to Stand: Supervision Stand pivot transfers: Supervision       General transfer comment: one VC safety with turn completion and to advance R LE to adhere to THP with stand to sit  Ambulation/Gait Ambulation/Gait assistance: Supervision;Min guard Ambulation Distance (Feet): 125 Feet Assistive device: Rolling walker (2 wheeled) Gait Pattern/deviations: Step-to pattern;Decreased stance time - right;Antalgic Gait velocity: decreased   General Gait Details: tolerated an increased distance.  One VC safety with turns.  Adhering to [facility].  Minimal hip pain.   Stairs            Wheelchair Mobility    Modified Rankin (Stroke Patients Only)       Balance                                            Cognition Arousal/Alertness:  Awake/alert Behavior During Therapy: WFL for tasks assessed/performed Overall Cognitive Status: Within Functional Limits for tasks assessed                                        Exercises      General Comments        Pertinent Vitals/Pain Pain Assessment: Faces Faces Pain Scale: Hurts a little bit Pain Location: ABD pain "cramps" Pain Descriptors / Indicators: Constant Pain Intervention(s): Monitored during session    Home Living                      Prior Function            PT Goals (current goals can now be found in the care plan section) Progress towards PT goals: Progressing toward goals    Frequency    7X/week      PT Plan Current plan remains appropriate    Co-evaluation              AM-PAC PT "6 Clicks" Daily Activity  Outcome Measure  Difficulty turning over in bed (including adjusting bedclothes, sheets and blankets)?: A Little Difficulty moving from lying on back to sitting on the side of the bed? : Unable Difficulty sitting down on and standing up from a chair  with arms (e.g., wheelchair, bedside commode, etc,.)?: Unable Help needed moving to and from a bed to chair (including a wheelchair)?: A Little Help needed walking in hospital room?: A Little Help needed climbing 3-5 steps with a railing? : A Lot 6 Click Score: 13    End of Session Equipment Utilized During Treatment: Gait belt Activity Tolerance: Patient tolerated treatment well Patient left: in bed;with call bell/phone within reach Nurse Communication: Mobility status;Patient requests pain meds PT Visit Diagnosis: Other abnormalities of gait and mobility (R26.89)     Time: 1050-1100 PT Time Calculation (min) (ACUTE ONLY): 10 min  Charges:  $Gait Training: 8-22 mins                    G Codes:       {Sonali Wivell  PTA [facility]  Acute  Rehab Pager      [phone]

## 2017-05-19 NOTE — Progress Notes (Signed)
Patient ID: [name], female   DOB: May 02, 1961, 56 y.o.   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  She is having some pruritis and feels swollen.  Otherwise no complanits.  Objective:   VITALS:   Vitals:   05/18/17 2108 05/19/17 0616  BP: (!) 117/58 122/66  Pulse: 82 82  Resp: 18 18  Temp: 98.4 F (36.9 C) 98.6 F (37 C)  SpO2: 97% 99%    Neurovascular intact Incision: dressing C/D/I  LABS No results for input(s): HGB, HCT, WBC, PLT in the last 72 hours.  No results for input(s): NA, K, BUN, CREATININE, GLUCOSE in the last 72 hours.  No results for input(s): LABPT, INR in the last 72 hours.   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 PWB RLE until further directed

## 2017-05-19 NOTE — Progress Notes (Signed)
Physical Therapy Treatment Patient Details Name: [name] MRN: [id number] DOB: 1960/09/18 Today's Date: 05/19/2017    History of Present Illness Pt is a 56 year old female s/p Right total hip resection with antibiotic spacer    PT Comments    Pt continues to progress steadily with mobility but continues to require cues for safety awareness and adherence to THP.   Follow Up Recommendations  SNF     Equipment Recommendations  Rolling walker with 5" wheels    Recommendations for Other Services       Precautions / Restrictions Precautions Precautions: Posterior Hip Restrictions Weight Bearing Restrictions: Yes RLE Weight Bearing: Partial weight bearing RLE Partial Weight Bearing Percentage or Pounds: 50%    Mobility  Bed Mobility Overal bed mobility: Needs Assistance Bed Mobility: Supine to Sit;Sit to Supine     Supine to sit: Supervision Sit to supine: Supervision   General bed mobility comments: able to self perform with increased time  Transfers Overall transfer level: Needs assistance Equipment used: Rolling walker (2 wheeled) Transfers: Sit to/from Stand Sit to Stand: Supervision         General transfer comment: cues for safe transition position and to advance R LE to adhere to THP with stand to sit  Ambulation/Gait Ambulation/Gait assistance: Supervision;Min guard Ambulation Distance (Feet): 100 Feet Assistive device: Rolling walker (2 wheeled) Gait Pattern/deviations: Decreased stance time - right;Antalgic;Step-to pattern;Step-through pattern;Trunk flexed Gait velocity: decreased Gait velocity interpretation: Below normal speed for age/gender General Gait Details: cues for posture, position from RW, adherence to [facility]   Stairs            Wheelchair Mobility    Modified Rankin (Stroke Patients Only)       Balance                                            Cognition Arousal/Alertness: Awake/alert Behavior During  Therapy: WFL for tasks assessed/performed;Impulsive Overall Cognitive Status: Within Functional Limits for tasks assessed                                 General Comments: increased safety cognition and adhering to THP and PWB      Exercises      General Comments        Pertinent Vitals/Pain Pain Assessment: 0-10 Pain Score: 8  Faces Pain Scale: Hurts a little bit Pain Location: R hip Pain Descriptors / Indicators: Aching;Sore Pain Intervention(s): Limited activity within patient's tolerance;Monitored during session;Premedicated before session;Ice applied    Home Living                      Prior Function            PT Goals (current goals can now be found in the care plan section) Acute Rehab PT Goals Patient Stated Goal: Home PT Goal Formulation: With patient Time For Goal Achievement: 05/18/17 Potential to Achieve Goals: Good Progress towards PT goals: Progressing toward goals    Frequency    7X/week      PT Plan Current plan remains appropriate    Co-evaluation              AM-PAC PT "6 Clicks" Daily Activity  Outcome Measure  Difficulty turning over in bed (including adjusting bedclothes, sheets and blankets)?:  A Little Difficulty moving from lying on back to sitting on the side of the bed? : A Lot Difficulty sitting down on and standing up from a chair with arms (e.g., wheelchair, bedside commode, etc,.)?: A Lot Help needed moving to and from a bed to chair (including a wheelchair)?: A Little Help needed walking in hospital room?: A Little Help needed climbing 3-5 steps with a railing? : A Lot 6 Click Score: 15    End of Session Equipment Utilized During Treatment: Gait belt Activity Tolerance: Patient tolerated treatment well Patient left: in bed;with call bell/phone within reach Nurse Communication: Mobility status;Patient requests pain meds PT Visit Diagnosis: Other abnormalities of gait and mobility (R26.89)      Time: 1345-1410 PT Time Calculation (min) (ACUTE ONLY): 25 min  Charges:  $Gait Training: 23-37 mins                    G Codes:       Pg [phone]    Jeanell Mangan 05/19/2017, 5:04 PM

## 2017-05-20 NOTE — Progress Notes (Signed)
PT Cancellation Note  Patient Details Name: GRICELDA FOLAND MRN: [id number] DOB: [birth year]   Cancelled Treatment:     PT attempted x 2 but deferred this am 2* pt fatigue and this pm 2* headache.  Will follow.   Donjuan Robison 05/20/2017, 2:21 PM

## 2017-05-20 NOTE — Progress Notes (Signed)
Occupational Therapy Treatment Patient Details Name: [name] MRN: [id number] DOB: [birth year] Today's Date: 05/20/2017    History of present illness Pt is a 56 year old female s/p Right total hip resection with antibiotic spacer   OT comments  Reviewed AE and worked with Secondary school teacher and sock aide. Pt reports not feeling well this am.  Follow Up Recommendations  SNF    Equipment Recommendations  3 in 1 bedside commode    Recommendations for Other Services      Precautions / Restrictions Precautions Precautions: Posterior Hip Precaution Comments: reviewed posterior THPs and PWB Restrictions RLE Weight Bearing: Partial weight bearing RLE Partial Weight Bearing Percentage or Pounds: 50%       Mobility Bed Mobility                  Transfers                      Balance                                           ADL either performed or assessed with clinical judgement   ADL                       Lower Body Dressing: Minimal assistance;Bed level                 General ADL Comments: donned and doffed socks with reacher and sock aide from bed level. Pt reports not feeling well this am.  Reviewed AE and issued this.  She was willing to practice with reacher and sock aide at bed level for bil LEs.       Vision       Perception     Praxis      Cognition Arousal/Alertness: Awake/alert Behavior During Therapy: WFL for tasks assessed/performed;Impulsive                                   General Comments: continues to need cues for THPs        Exercises     Shoulder Instructions       General Comments      Pertinent Vitals/ Pain       Pain Score: 6  Pain Location: R hip Pain Descriptors / Indicators: Aching;Sore Pain Intervention(s): Limited activity within patient's tolerance;Monitored during session;Premedicated before session;Repositioned  Home Living                                           Prior Functioning/Environment              Frequency           Progress Toward Goals  OT Goals(current goals can now be found in the care plan section)  Progress towards OT goals: Progressing toward goals     Plan      Co-evaluation                 AM-PAC PT "6 Clicks" Daily Activity     Outcome Measure   Help from another person eating meals?: None Help from another person taking care of personal grooming?: A Little Help from  another person toileting, which includes using toliet, bedpan, or urinal?: A Little Help from another person bathing (including washing, rinsing, drying)?: A Little Help from another person to put on and taking off regular upper body clothing?: A Little Help from another person to put on and taking off regular lower body clothing?: A Little 6 Click Score: 19    End of Session    OT Visit Diagnosis: Pain Pain - Right/Left: Right Pain - part of body: Hip   Activity Tolerance Patient limited by fatigue   Patient Left in bed;with call bell/phone within reach;with bed alarm set   Nurse Communication          Time: 6553-7482 OT Time Calculation (min): 19 min  Charges: OT General Charges $OT Visit: 1 Visit OT Treatments $Self Care/Home Management : 8-22 mins  Lesle Chris, OTR/L [phone] 05/20/2017   [name] 05/20/2017, 8:32 AM

## 2017-05-20 NOTE — Progress Notes (Signed)
     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  No events throughout the night.  Awaiting for approval to d/c to SNF.  Objective:   VITALS:   Vitals:   05/20/17 0415 05/20/17 0942  BP: 94/78 109/67  Pulse:  82  Resp: 18   Temp: 99.3 F (37.4 C)   SpO2: 99% 98%    Dorsiflexion/Plantar flexion intact Incision: dressing C/D/I No cellulitis present Compartment soft  LABS No results for input(s): HGB, HCT, WBC, PLT in the last 72 hours.  No results for input(s): NA, K, BUN, CREATININE, GLUCOSE in the last 72 hours.   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 [name]   PAC  05/20/2017, 1:42 PM

## 2017-05-21 LAB — BASIC METABOLIC PANEL
ANION GAP: 4 — AB (ref 5–15)
BUN: 20 mg/dL (ref 6–20)
CALCIUM: 8.7 mg/dL — AB (ref 8.9–10.3)
CO2: 30 mmol/L (ref 22–32)
Chloride: 101 mmol/L (ref 101–111)
Creatinine, Ser: 0.91 mg/dL (ref 0.44–1.00)
Glucose, Bld: 100 mg/dL — ABNORMAL HIGH (ref 65–99)
Potassium: 4.2 mmol/L (ref 3.5–5.1)
Sodium: 135 mmol/L (ref 135–145)

## 2017-05-21 LAB — CBC
HCT: 25 % — ABNORMAL LOW (ref 36.0–46.0)
Hemoglobin: 7.8 g/dL — ABNORMAL LOW (ref 12.0–15.0)
MCH: 28.1 pg (ref 26.0–34.0)
MCHC: 31.2 g/dL (ref 30.0–36.0)
MCV: 89.9 fL (ref 78.0–100.0)
PLATELETS: 194 10*3/uL (ref 150–400)
RBC: 2.78 MIL/uL — ABNORMAL LOW (ref 3.87–5.11)
RDW: 19 % — AB (ref 11.5–15.5)
WBC: 5.8 10*3/uL (ref 4.0–10.5)

## 2017-05-21 NOTE — Progress Notes (Signed)
Patient still has SNF bed at [facility]. PASRR still under review. CSW called PASRR-they are behind in reviewing clinical information due to holiday.  CSW will inform medical staff when PASRR is received.   Kathrin Greathouse, Latanya Presser, MSW Clinical Social Worker  [phone] 05/21/2017  1:43 PM

## 2017-05-21 NOTE — Progress Notes (Signed)
Physical Therapy Treatment Patient Details Name: [name] MRN: [id number] DOB: May 19, 1961 Today's Date: 05/21/2017    History of Present Illness Pt is a 56 year old female s/p Right total hip resection with antibiotic spacer    PT Comments    Assisted OOB to amb a limited distance in hallway due to pain and fatigue.   Follow Up Recommendations  SNF     Equipment Recommendations  Rolling walker with 5" wheels    Recommendations for Other Services       Precautions / Restrictions Precautions Precautions: Posterior Hip Precaution Comments: reviewed posterior THPs and PWB Restrictions Weight Bearing Restrictions: Yes RLE Weight Bearing: Partial weight bearing RLE Partial Weight Bearing Percentage or Pounds: 50%    Mobility  Bed Mobility Overal bed mobility: Needs Assistance Bed Mobility: Supine to Sit;Sit to Supine     Supine to sit: Supervision     General bed mobility comments: able to self perform with increased time  Transfers Overall transfer level: Needs assistance Equipment used: Rolling walker (2 wheeled)   Sit to Stand: Supervision Stand pivot transfers: Supervision          Ambulation/Gait Ambulation/Gait assistance: Min guard;Supervision Ambulation Distance (Feet): 76 Feet Assistive device: Rolling walker (2 wheeled) Gait Pattern/deviations: Decreased stance time - right;Antalgic;Step-to pattern;Step-through pattern;Trunk flexed Gait velocity: decreased   General Gait Details: cues for posture, position from RW, adherence to [facility]   Stairs            Wheelchair Mobility    Modified Rankin (Stroke Patients Only)       Balance                                            Cognition Arousal/Alertness: Awake/alert Behavior During Therapy: WFL for tasks assessed/performed Overall Cognitive Status: Within Functional Limits for tasks assessed                                        Exercises       General Comments        Pertinent Vitals/Pain Pain Assessment: No/denies pain Faces Pain Scale: Hurts a little bit Pain Location: R hip Pain Descriptors / Indicators: Aching;Sore;Operative site guarding Pain Intervention(s): Monitored during session;Repositioned    Home Living                      Prior Function            PT Goals (current goals can now be found in the care plan section) Progress towards PT goals: Progressing toward goals    Frequency    7X/week      PT Plan Current plan remains appropriate    Co-evaluation              AM-PAC PT "6 Clicks" Daily Activity  Outcome Measure  Difficulty turning over in bed (including adjusting bedclothes, sheets and blankets)?: A Little Difficulty moving from lying on back to sitting on the side of the bed? : A Lot Difficulty sitting down on and standing up from a chair with arms (e.g., wheelchair, bedside commode, etc,.)?: A Lot Help needed moving to and from a bed to chair (including a wheelchair)?: A Little Help needed walking in hospital room?: A Little Help needed climbing 3-5  steps with a railing? : A Lot 6 Click Score: 15    End of Session Equipment Utilized During Treatment: Gait belt Activity Tolerance: Patient tolerated treatment well Patient left: in bed;with call bell/phone within reach Nurse Communication: Mobility status PT Visit Diagnosis: Other abnormalities of gait and mobility (R26.89)     Time: 8768-1157 PT Time Calculation (min) (ACUTE ONLY): 13 min  Charges:  $Gait Training: 8-22 mins                    G Codes:       Rica Koyanagi  PTA [facility]  Acute  Rehab Pager      [phone]

## 2017-05-21 NOTE — Progress Notes (Signed)
     Subjective: 9 Days Post-Op Procedure(s) (LRB): Right total hip resection WITH ANTIBIOTIC SPACER (Right)   Patient reports pain as mild, pain controlled. Little more pain this morning, but doing ok.  No events throughout the night.  Awaiting for approval to d/c to SNF.   Objective:   VITALS:   Vitals:   05/20/17 2051 05/21/17 0554  BP: 136/68 126/63  Pulse: 94 80  Resp: 17 16  Temp: 98.7 F (37.1 C) 98.5 F (36.9 C)  SpO2: 98% 96%    Dorsiflexion/Plantar flexion intact Incision: dressing C/D/I No cellulitis present Compartment soft  LABS Recent Labs    05/21/17 0509  HGB 7.8*  HCT 25.0*  WBC 5.8  PLT 194    Recent Labs    05/21/17 0509  NA 135  K 4.2  BUN 20  CREATININE 0.91  GLUCOSE 100*     Assessment/Plan: 9 Days Post-Op Procedure(s) (LRB): Right total hip resection WITH ANTIBIOTIC SPACER (Right)  Up with therapy Discharge to SNF when able.  Awaiting word from SW regarding status     [place]. Letti Towell   PAC  05/21/2017, 7:36 AM

## 2017-05-22 MED ORDER — HEPARIN SOD (PORK) LOCK FLUSH 100 UNIT/ML IV SOLN: 250.0000 [IU] | Freq: Every day | INTRAVENOUS | Status: DC

## 2017-05-22 MED ORDER — HEPARIN SOD (PORK) LOCK FLUSH 100 UNIT/ML IV SOLN
250.0000 [IU] | INTRAVENOUS | Status: DC | PRN
  Administered 2017-05-22: 11:00:00

## 2017-05-22 NOTE — Progress Notes (Signed)
Report called and given to [facility], [place] 10:15 AM  05-22-2017

## 2017-05-22 NOTE — Progress Notes (Addendum)
PASRR received. Patient is able to discharge to [facility] today.  D/C Summary sent.  Patient will transport by [facility]. Scheduled for 1:00pm pick up. Patient has arranged for her daughter to carry her personal belongings.  [facility]. Form Complete.  Kathrin Greathouse, Latanya Presser, MSW Clinical Social Worker  [phone] 05/22/2017  9:11 AM

## 2017-05-22 NOTE — Progress Notes (Signed)
     Subjective: 10 Days Post-Op Procedure(s) (LRB): Right total hip resection WITH ANTIBIOTIC SPACER (Right)   Patient reports pain as mild, in the hip.  Starting to get a HA this morning. No events throughout the night.  Still  awaiting for approval to d/c to SNF.    Objective:   VITALS:   Vitals:   05/21/17 2130 05/22/17 0535  BP: (!) 122/56 120/75  Pulse: 87 77  Resp: 18 18  Temp: 98.4 F (36.9 C) 98.4 F (36.9 C)  SpO2: 97% 97%    Dorsiflexion/Plantar flexion intact Incision: dressing C/D/I No cellulitis present Compartment soft  LABS Recent Labs    05/21/17 0509  HGB 7.8*  HCT 25.0*  WBC 5.8  PLT 194    Recent Labs    05/21/17 0509  NA 135  K 4.2  BUN 20  CREATININE 0.91  GLUCOSE 100*     Assessment/Plan: 10 Days Post-Op Procedure(s) (LRB): Right total hip resection WITH ANTIBIOTIC SPACER (Right)  Up with therapy Discharge to SNF when approved Follow up in 2 weeks at [facility]. Follow up with OLIN,Earvin Blazier D in 1 weeks.  Contact information:  [facility] [address], [address] [place]   PAC  05/22/2017, 7:31 AM

## 2017-06-04 ENCOUNTER — Other Ambulatory Visit: Payer: Self-pay | Admitting: *Deleted

## 2017-06-04 ENCOUNTER — Ambulatory Visit (INDEPENDENT_AMBULATORY_CARE_PROVIDER_SITE_OTHER): Payer: Medicare Other | Admitting: Internal Medicine

## 2017-06-04 ENCOUNTER — Encounter: Payer: Self-pay | Admitting: Internal Medicine

## 2017-06-04 VITALS — BP 136/77 | HR 89 | Temp 97.9°F | Ht 67.0 in | Wt 160.0 lb

## 2017-06-04 DIAGNOSIS — T8450XA Infection and inflammatory reaction due to unspecified internal joint prosthesis, initial encounter: Secondary | ICD-10-CM | POA: Insufficient documentation

## 2017-06-04 DIAGNOSIS — T8450XD Infection and inflammatory reaction due to unspecified internal joint prosthesis, subsequent encounter: Secondary | ICD-10-CM

## 2017-06-04 DIAGNOSIS — B182 Chronic viral hepatitis C: Secondary | ICD-10-CM

## 2017-06-04 DIAGNOSIS — K7460 Unspecified cirrhosis of liver: Secondary | ICD-10-CM | POA: Diagnosis not present

## 2017-06-04 MED ORDER — PROMETHAZINE HCL 12.5 MG PO TABS: 12.5000 mg | ORAL_TABLET | Freq: Four times a day (QID) | ORAL | 0 refills | Status: AC | PRN

## 2017-06-04 MED ORDER — MUPIROCIN 2 % EX OINT: TOPICAL_OINTMENT | CUTANEOUS | 0 refills | Status: DC

## 2017-06-04 MED ORDER — PROMETHAZINE HCL 12.5 MG PO TABS: 12.5000 mg | ORAL_TABLET | Freq: Four times a day (QID) | ORAL | 0 refills | Status: DC | PRN

## 2017-06-04 NOTE — Assessment & Plan Note (Signed)
No concerns on history or exam though still with pain.  No labs done at facility that are available to me. Will check crp, ESR today.  If no concerns, will plan on completing treatment on January 28th as previously outlined and will get reimplanted by Dr. Olin after that.  Some swelling of her leg but none currently.   

## 2017-06-04 NOTE — Assessment & Plan Note (Signed)
Normal platelets, low albumin c/w advanced cirrhosis.  Cirrhosis noted on ultrasound.

## 2017-06-04 NOTE — Progress Notes (Signed)
   Subjective:    Patient ID: [name], female    DOB: 03-22-1961, 57 y.o.   MRN: [id number]  HPI Here for follow up of PJI. Has a history of right THA 10 years ago and recent pain and underwent 2 stage revision by Dr. Alvan Dame 12/18 with some remaining femoral component and spacer placement.  Culture grew MSSA and she has remained on cefazolin for 6 weeks through January 28th.  No labs available now. She does endorse a lot of pain, some swelling she has noted in her leg.  Complaint of nausea as well.  No fever, no chills.   She did have MRSA bacteremia the year before this infectionl.  History of IVDU. Nervous about having a Staph infection.    Review of Systems  Constitutional: Negative for chills and fever.  Gastrointestinal: Positive for nausea. Negative for diarrhea.  Musculoskeletal: Positive for arthralgias.  Skin: Negative for rash.  Neurological: Negative for dizziness.       Objective:   Physical Exam  Constitutional: She appears well-developed and well-nourished. No distress.  HENT:  Mouth/Throat: No oropharyngeal exudate.  Eyes: No scleral icterus.  Cardiovascular: Normal rate, regular rhythm and normal heart sounds.  No murmur heard. Pulmonary/Chest: Effort normal and breath sounds normal. No respiratory distress.  Skin: No rash noted.   SH: no current drug use, in a SNF for duration of the treatment.        Assessment & Plan:

## 2017-06-04 NOTE — Patient Instructions (Signed)
OK to use phernergan as needed.

## 2017-06-04 NOTE — Assessment & Plan Note (Signed)
Will do labs today and she can return after surgery and I will consider treatment for hepatitis C

## 2017-06-05 LAB — COMPLETE METABOLIC PANEL WITH GFR
AG RATIO: 0.9 (calc) — AB (ref 1.0–2.5)
ALT: 4 U/L — AB (ref 6–29)
AST: 25 U/L (ref 10–35)
Albumin: 3.4 g/dL — ABNORMAL LOW (ref 3.6–5.1)
Alkaline phosphatase (APISO): 100 U/L (ref 33–130)
BILIRUBIN TOTAL: 0.2 mg/dL (ref 0.2–1.2)
BUN: 7 mg/dL (ref 7–25)
CHLORIDE: 101 mmol/L (ref 98–110)
CO2: 24 mmol/L (ref 20–32)
Calcium: 8.5 mg/dL — ABNORMAL LOW (ref 8.6–10.4)
Creat: 0.76 mg/dL (ref 0.50–1.05)
GFR, EST AFRICAN AMERICAN: 102 mL/min/{1.73_m2} (ref >=60)
GFR, Est Non African American: 88 mL/min/{1.73_m2} (ref >=60)
Globulin: 4 g/dL (calc) — ABNORMAL HIGH (ref 1.9–3.7)
Glucose, Bld: 78 mg/dL (ref 65–99)
POTASSIUM: 4.3 mmol/L (ref 3.5–5.3)
Sodium: 135 mmol/L (ref 135–146)
Total Protein: 7.4 g/dL (ref 6.1–8.1)

## 2017-06-05 LAB — CBC WITH DIFFERENTIAL/PLATELET
Basophils Absolute: 19 cells/uL (ref 0–200)
Basophils Relative: 0.6 %
EOS ABS: 141 {cells}/uL (ref 15–500)
Eosinophils Relative: 4.4 %
HCT: 26.5 % — ABNORMAL LOW (ref 35.0–45.0)
Hemoglobin: 8.7 g/dL — ABNORMAL LOW (ref 11.7–15.5)
Lymphs Abs: 781 cells/uL — ABNORMAL LOW (ref 850–3900)
MCH: 28.2 pg (ref 27.0–33.0)
MCHC: 32.8 g/dL (ref 32.0–36.0)
MCV: 86 fL (ref 80.0–100.0)
MPV: 10.1 fL (ref 7.5–12.5)
Monocytes Relative: 7.8 %
NEUTROS PCT: 62.8 %
Neutro Abs: 2010 cells/uL (ref 1500–7800)
PLATELETS: 160 10*3/uL (ref 140–400)
RBC: 3.08 10*6/uL — ABNORMAL LOW (ref 3.80–5.10)
RDW: 15.8 % — AB (ref 11.0–15.0)
TOTAL LYMPHOCYTE: 24.4 %
WBC: 3.2 10*3/uL — ABNORMAL LOW (ref 3.8–10.8)
WBCMIX: 250 {cells}/uL (ref 200–950)

## 2017-06-05 LAB — SEDIMENTATION RATE: Sed Rate: 77 mm/h — ABNORMAL HIGH (ref 0–30)

## 2017-06-05 LAB — HIV ANTIBODY (ROUTINE TESTING W REFLEX): HIV: NONREACTIVE

## 2017-06-05 LAB — HEPATITIS B SURFACE ANTIGEN: Hepatitis B Surface Ag: NONREACTIVE

## 2017-06-05 LAB — HEPATITIS B CORE ANTIBODY, TOTAL: Hep B Core Total Ab: NONREACTIVE

## 2017-06-05 LAB — HEPATITIS B SURFACE ANTIBODY,QUALITATIVE: Hep B S Ab: NONREACTIVE

## 2017-06-05 LAB — C-REACTIVE PROTEIN: CRP: 10.6 mg/L — ABNORMAL HIGH (ref <8.0)

## 2017-06-05 LAB — HEPATITIS A ANTIBODY, TOTAL: HEPATITIS A AB,TOTAL: NONREACTIVE

## 2017-06-08 LAB — HEPATITIS C RNA QUANTITATIVE
HCV QUANT LOG: 6.65 {Log_IU}/mL — AB
HCV RNA, PCR, QN: 4460000 [IU]/mL — AB

## 2017-06-08 LAB — HEPATITIS C GENOTYPE

## 2017-06-09 ENCOUNTER — Telehealth: Payer: Self-pay | Admitting: *Deleted

## 2017-06-09 LAB — LIVER FIBROSIS, FIBROTEST-ACTITEST
ALT: 3 U/L — AB (ref 6–29)
Alpha-2-Macroglobulin: 310 mg/dL — ABNORMAL HIGH (ref 106–279)
Apolipoprotein A1: 137 mg/dL (ref 101–198)
Bilirubin: 0.1 mg/dL — ABNORMAL LOW (ref 0.2–1.2)
Fibrosis Score: 0.16
GGT: 50 U/L (ref 3–70)
HAPTOGLOBIN: 210 mg/dL (ref 43–212)
NECROINFLAMMAT ACT SCORE: 0
REFERENCE ID: 2281814

## 2017-06-09 NOTE — Telephone Encounter (Signed)
Patient called 1/14 stating she was just notified on 1/12 that her medicare needed to be renewed, that it would expire on 1/17 and she was to be discharged from [facility] at that time. She is concerned regarding continuation of her medication, her high copays without insurance coverage. She is asking Dr Linus Salmons to call Medicare on her behalf, since he is the one who was able to get her in to the facility after her hospital stay. RN advised patient to speak with social worker or charge nurse at the rehab center regarding steps to renew her medicare. [facility] should be setting up discharge plans to continue IV antibiotics if they are discharging her from their care before she has completed therapy.  Patient said she would follow up. RN called [facility] and asked to speak with a Education officer, museum, notified her of the situation. She will follow up with Valinda Hoar in their business office to see what the status is. Landis Gandy, RN

## 2017-06-22 ENCOUNTER — Ambulatory Visit (INDEPENDENT_AMBULATORY_CARE_PROVIDER_SITE_OTHER): Payer: Medicare Other | Admitting: Internal Medicine

## 2017-06-22 ENCOUNTER — Encounter: Payer: Self-pay | Admitting: Internal Medicine

## 2017-06-22 DIAGNOSIS — T8450XD Infection and inflammatory reaction due to unspecified internal joint prosthesis, subsequent encounter: Secondary | ICD-10-CM | POA: Diagnosis not present

## 2017-06-22 NOTE — Progress Notes (Signed)
[facility] for Infectious Disease  Patient Active Problem List   Diagnosis Date Noted  . 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 MRSA bacteremia 04/15/2016    Priority: High  . IVDU (intravenous drug user) 05/14/2016    Priority: Medium  . Polysubstance abuse ([place])     Priority: Medium  . Cirrhosis of liver without ascites (HCC)     Priority: Medium  . History of bipolar disorder 10/05/2014    Priority: Medium  . Chronic hepatitis C without hepatic coma (HCC)     Priority: Medium  . Right hip spacer / infection 05/12/2017  . Upper GI bleed   . Bipolar affective disorder ([place]) 04/15/2016  . History of cirrhosis 10/05/2014  . Essential hypertension 10/05/2014  . S/P left THA, AA 08/25/2012  . Expected blood loss anemia 08/25/2012  . Obesity (BMI 30-39.9) 08/25/2012  . Pancytopenia 09/08/2011  . Anemia 09/04/2011  . PUD (peptic ulcer disease)   . Thrombocytopenia ([place])     Patient's Medications  New Prescriptions   No medications on file  Previous Medications   ALPRAZOLAM (XANAX) 1 MG TABLET    Take 1 tablet (1 mg total) by mouth 2 (two) times daily as needed for anxiety.   AMPHETAMINE-DEXTROAMPHETAMINE (ADDERALL) 20 MG TABLET    Take 20 mg by mouth 2 (two) times daily.    CYCLOBENZAPRINE (FLEXERIL) 10 MG TABLET    Take 1 tablet (10 mg total) by mouth 2 (two) times daily as needed for muscle spasms.   DOCUSATE SODIUM (COLACE) 100 MG CAPSULE    Take 1 capsule (100 mg total) by mouth 2 (two) times daily.   FERROUS SULFATE 325 (65 FE) MG TABLET    Take 1 tablet (325 mg total) by mouth 3 (three) times daily after meals.   LISINOPRIL (PRINIVIL,ZESTRIL) 5 MG TABLET    Take 1 tablet (5 mg total) by mouth daily.   MUPIROCIN OINTMENT (BACTROBAN) 2 %    Place 1 application into the nose and under the fingernails 2 (two) times daily for 5 days.   NICOTINE (NICODERM CQ - DOSED IN MG/24 HOURS) 14 MG/24HR PATCH    Place 1 patch (14 mg total) onto  the skin daily.   OXYCODONE 10 MG TABS    Take 1-1.5 tablets (10-15 mg total) by mouth every 4 (four) hours as needed for moderate pain ((score 1 to 5)).   POLYETHYLENE GLYCOL (MIRALAX / GLYCOLAX) PACKET    Take 17 g by mouth 2 (two) times daily.   PROMETHAZINE (PHENERGAN) 12.5 MG TABLET    Take 1 tablet (12.5 mg total) by mouth every 6 (six) hours as needed for nausea or vomiting.   QUETIAPINE (SEROQUEL) 200 MG TABLET    Take 200 mg by mouth at bedtime as needed (mood, agitation).    RIVAROXABAN (XARELTO) 10 MG TABS TABLET    Take 1 tablet (10 mg total) by mouth daily for 14 days.  Modified Medications   No medications on file  Discontinued Medications   CEFAZOLIN (ANCEF) IVPB    Inject 2 g into the vein every 8 (eight) hours. Indication:  Prosthetic joint infection Last Day of Therapy:  06/22/17 Labs - Once weekly:  CBC/D and BMP, Labs - Every other week:  ESR and CRP    Subjective: [name] is in for a routine follow-up visit.  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  She underwent debridement of her right hand she was readmitted and underwent right hip surgery on 05/15/2017.  I am not entirely clear what that is surgery involved.  The operative note indicates that the femoral stem was retained and a new acetabular liner and femoral head was placed.  The discharge summary indicates an antibiotic spacer was placed.  She has now completed 6 weeks of postoperative cefazolin.  She has had no problems tolerating her antibiotic or PICC.  She was released from her skilled nursing facility this morning.  She is still having some discomfort in her right hip but overall is feeling much better.  Review of Systems: Review of Systems  Constitutional: Negative for chills, diaphoresis and fever.  Gastrointestinal: Negative for abdominal pain, diarrhea, nausea and vomiting.  Musculoskeletal: Positive for joint pain.    Past Medical History:    Diagnosis Date  . Anxiety   . Bipolar affective disorder ([place])    ADHD AND SCHZIOPHRENIC--PT GOES TO [facility]. [facility]  . Blood transfusion without reported diagnosis   . Cervical radiculopathy   . Chronic pain syndrome   . Complication of anesthesia    STATES SHE WOKE UP TWICE DURING SURGERIES  . DJD (degenerative joint disease)   . GERD (gastroesophageal reflux disease)   . H/O hiatal hernia   . Hepatitis C infection    STATES COMPLETED TREATMENT AND NO LONGER HAS INFECTION  . Hypertension   . Leukocytopenia   . Osteoarthritis   . Osteoporosis   . Peptic ulcer disease   . Pneumonia 2013  . Polysubstance (excluding opioids) dependence ([place])   . Seizures (HCC)    SEIZURE AFTER BREATHING TX (ALBUTEROL) IN ER; ALSO PAST HX SEIZURES DURING DRUG DETOX  . Shortness of breath    WITH EXERTION; SMOKER  . Thrombocytopenia (HCC)     Social History   Tobacco Use  . Smoking status: Current Some Day Smoker    Packs/day: 0.25    Years: 20.00    Pack years: 5.00    Types: Cigarettes  . Smokeless tobacco: Never Used  . Tobacco comment: 4/day 05/15/16; a pack a week   Substance Use Topics  . Alcohol use: No    Comment: HX OF ABUSE OF PAIN MEDS--PT IS CURRENTLY IN TREATMENT AT [facility]  . Drug use: No    Family History  Problem Relation Age of Onset  . Breast cancer Sister   . Cancer Sister   . Heart disease Mother   . Heart disease Father     Allergies  Allergen Reactions  . Albuterol Other (See Comments)    PT states she had seizures from an albuterol treatment and was hospitalized for it  . Aspirin Hives    Vomits blood  . Adhesive [Tape] Rash    Objective: Vitals:   06/22/17 1518  BP: (!) 145/85  Pulse: 99  Temp: 97.9 F (36.6 C)  TempSrc: Oral  Weight: 156 lb (70.8 kg)  Height: '5\' 7"'$  (1.702 m)   Body mass index is 24.43 kg/m.  Physical Exam  Constitutional: She is oriented to person, place, and time.   She is pleasant and talkative.  Cardiovascular: Normal rate and regular rhythm.  No murmur heard. Pulmonary/Chest: Effort normal and breath sounds normal.  Abdominal: Soft. She exhibits no distension.  Musculoskeletal:  Her right hip incision has healed.  There is no unusual swelling, warmth or redness.  Neurological: She is alert and oriented  to person, place, and time.  Skin: No rash noted.  Right arm PICC site looks normal.  Psychiatric: Mood and affect normal.    Lab Results Sed Rate  Date Value  06/04/2017 77 mm/h (H)  05/15/2017 80 mm/hr (H)   CRP  Date Value  06/04/2017 10.6 mg/L (H)  05/15/2017 <0.8 mg/dL     Problem List Items Addressed This Visit      High   Prosthetic joint infection ([place])    She has now completed 6 weeks of postoperative cefazolin for MSSA left prosthetic hip infection.  Rifampin was not used because she is on rivaroxaban.  Her inflammatory markers remained elevated to 18 days ago when she was seen by Dr. Linus Salmons.  She tells me that she can no longer afford to stay in the skilled nursing facility.  She is not a candidate for outpatient IV antibiotic therapy given her history of injecting drug use.  I will have her stop cefazolin now and her PICC was pulled here in clinic today.  Repeat inflammatory markers were obtained.  I told her that the only way we will know if the infection is cured is to observe her closely off of antibiotics.      Relevant Orders   C-reactive protein   Sedimentation rate       Michel Bickers, MD [facility] for Infectious [place] [phone] pager   [phone] cell 06/22/2017, 3:59 PM

## 2017-06-22 NOTE — Assessment & Plan Note (Signed)
She has now completed 6 weeks of postoperative cefazolin for MSSA left prosthetic hip infection.  Rifampin was not used because she is on rivaroxaban.  Her inflammatory markers remained elevated to 18 days ago when she was seen by Dr. Linus Salmons.  She tells me that she can no longer afford to stay in the skilled nursing facility.  She is not a candidate for outpatient IV antibiotic therapy given her history of injecting drug use.  I will have her stop cefazolin now and her PICC was pulled here in clinic today.  Repeat inflammatory markers were obtained.  I told her that the only way we will know if the infection is cured is to observe her closely off of antibiotics.

## 2017-06-23 ENCOUNTER — Inpatient Hospital Stay: Payer: Medicare Other | Admitting: Internal Medicine

## 2017-06-23 LAB — SEDIMENTATION RATE: Sed Rate: 46 mm/h — ABNORMAL HIGH (ref 0–30)

## 2017-06-23 LAB — C-REACTIVE PROTEIN: CRP: 1.2 mg/L (ref <8.0)

## 2017-06-23 NOTE — Progress Notes (Signed)
Per verbal order from Dr Megan Salon, 37 cm Single Lumen Peripherally Inserted Central Catheter removed from right basilic, tip intact. No sutures present. RN confirmed length per chart. Dressing was clean and dry. Petroleum dressing applied. Pt advised no heavy lifting with this arm, leave dressing for 24 hours and call the office or seek emergent care if dressing becomes soaked with blood, there is swelling, or sharp pain presents. Patient verbalized understanding and agreement.  Patient's questions answered to their satisfaction. Patient tolerated procedure well, RN walked patient to lab for peripheral stick, as her PICC has not had drawback for multiple weeks. Landis Gandy, RN

## 2017-08-13 ENCOUNTER — Ambulatory Visit: Payer: Medicare Other | Admitting: Internal Medicine

## 2017-09-03 ENCOUNTER — Ambulatory Visit: Payer: Medicare Other | Admitting: Internal Medicine

## 2017-09-14 NOTE — Progress Notes (Signed)
Preop on 4/25.  Need orders in epic.

## 2017-09-16 NOTE — Patient Instructions (Signed)
RANESHIA DERICK  09/16/2017   Your procedure is scheduled on: 09/21/2017   Report to [facility] Main  Entrance  Report to admitting at     [place] AM    Call this number if you have problems the morning of surgery [phone]   Remember: Do not eat food or drink liquids :After Midnight.     Take these medicines the morning of surgery with A SIP OF WATER: Xanax, Adderall, Oxycodone                                 You may not have any metal on your body including hair pins and              piercings  Do not wear jewelry, make-up, lotions, powders or perfumes, deodorant             Do not wear nail polish.  Do not shave  48 hours prior to surgery.              Do not bring valuables to the hospital. [place].  Contacts, dentures or bridgework may not be worn into surgery.  Leave suitcase in the car. After surgery it may be brought to your room.                   Please read over the following fact sheets you were given: _____________________________________________________________________             [facility] - Preparing for Surgery Before surgery, you can play an important role.  Because skin is not sterile, your skin needs to be as free of germs as possible.  You can reduce the number of germs on your skin by washing with CHG (chlorahexidine gluconate) soap before surgery.  CHG is an antiseptic cleaner which kills germs and bonds with the skin to continue killing germs even after washing. Please DO NOT use if you have an allergy to CHG or antibacterial soaps.  If your skin becomes reddened/irritated stop using the CHG and inform your nurse when you arrive at Short Stay. Do not shave (including legs and underarms) for at least 48 hours prior to the first CHG shower.  You may shave your face/neck. Please follow these instructions carefully:  1.  Shower with CHG Soap the night before surgery and the  morning  of Surgery.  2.  If you choose to wash your hair, wash your hair first as usual with your  normal  shampoo.  3.  After you shampoo, rinse your hair and body thoroughly to remove the  shampoo.                           4.  Use CHG as you would any other liquid soap.  You can apply chg directly  to the skin and wash                       Gently with a scrungie or clean washcloth.  5.  Apply the CHG Soap to your body ONLY FROM THE NECK DOWN.   Do not use on face/ open  Wound or open sores. Avoid contact with eyes, ears mouth and genitals (private parts).                       Wash face,  Genitals (private parts) with your normal soap.             6.  Wash thoroughly, paying special attention to the area where your surgery  will be performed.  7.  Thoroughly rinse your body with warm water from the neck down.  8.  DO NOT shower/wash with your normal soap after using and rinsing off  the CHG Soap.                9.  Pat yourself dry with a clean towel.            10.  Wear clean pajamas.            11.  Place clean sheets on your bed the night of your first shower and do not  sleep with pets. Day of Surgery : Do not apply any lotions/deodorants the morning of surgery.  Please wear clean clothes to the hospital/surgery center.  FAILURE TO FOLLOW THESE INSTRUCTIONS MAY RESULT IN THE CANCELLATION OF YOUR SURGERY PATIENT SIGNATURE_________________________________  NURSE SIGNATURE__________________________________  ________________________________________________________________________  WHAT IS A BLOOD TRANSFUSION? Blood Transfusion Information  A transfusion is the replacement of blood or some of its parts. Blood is made up of multiple cells which provide different functions.  Red blood cells carry oxygen and are used for blood loss replacement.  White blood cells fight against infection.  Platelets control bleeding.  Plasma helps clot blood.  Other blood products  are available for specialized needs, such as hemophilia or other clotting disorders. BEFORE THE TRANSFUSION  Who gives blood for transfusions?   Healthy volunteers who are fully evaluated to make sure their blood is safe. This is blood bank blood. Transfusion therapy is the safest it has ever been in the practice of medicine. Before blood is taken from a donor, a complete history is taken to make sure that person has no history of diseases nor engages in risky social behavior (examples are intravenous drug use or sexual activity with multiple partners). The donor's travel history is screened to minimize risk of transmitting infections, such as malaria. The donated blood is tested for signs of infectious diseases, such as HIV and hepatitis. The blood is then tested to be sure it is compatible with you in order to minimize the chance of a transfusion reaction. If you or a relative donates blood, this is often done in anticipation of surgery and is not appropriate for emergency situations. It takes many days to process the donated blood. RISKS AND COMPLICATIONS Although transfusion therapy is very safe and saves many lives, the main dangers of transfusion include:   Getting an infectious disease.  Developing a transfusion reaction. This is an allergic reaction to something in the blood you were given. Every precaution is taken to prevent this. The decision to have a blood transfusion has been considered carefully by your caregiver before blood is given. Blood is not given unless the benefits outweigh the risks. AFTER THE TRANSFUSION  Right after receiving a blood transfusion, you will usually feel much better and more energetic. This is especially true if your red blood cells have gotten low (anemic). The transfusion raises the level of the red blood cells which carry oxygen, and this usually causes an energy increase.  The  nurse administering the transfusion will monitor you carefully for  complications. HOME CARE INSTRUCTIONS  No special instructions are needed after a transfusion. You may find your energy is better. Speak with your caregiver about any limitations on activity for underlying diseases you may have. SEEK MEDICAL CARE IF:   Your condition is not improving after your transfusion.  You develop redness or irritation at the intravenous (IV) site. SEEK IMMEDIATE MEDICAL CARE IF:  Any of the following symptoms occur over the next 12 hours:  Shaking chills.  You have a temperature by mouth above 102 F (38.9 C), not controlled by medicine.  Chest, back, or muscle pain.  People around you feel you are not acting correctly or are confused.  Shortness of breath or difficulty breathing.  Dizziness and fainting.  You get a rash or develop hives.  You have a decrease in urine output.  Your urine turns a dark color or changes to pink, red, or brown. Any of the following symptoms occur over the next 10 days:  You have a temperature by mouth above 102 F (38.9 C), not controlled by medicine.  Shortness of breath.  Weakness after normal activity.  The white part of the eye turns yellow (jaundice).  You have a decrease in the amount of urine or are urinating less often.  Your urine turns a dark color or changes to pink, red, or brown. Document Released: 05/09/2000 Document Revised: 08/04/2011 Document Reviewed: 12/27/2007 ExitCare Patient Information 2014 [place].  _______________________________________________________________________  Incentive Spirometer  An incentive spirometer is a tool that can help keep your lungs clear and active. This tool measures how well you are filling your lungs with each breath. Taking long deep breaths may help reverse or decrease the chance of developing breathing (pulmonary) problems (especially infection) following:  A long period of time when you are unable to move or be active. BEFORE THE PROCEDURE   If  the spirometer includes an indicator to show your best effort, your nurse or respiratory therapist will set it to a desired goal.  If possible, sit up straight or lean slightly forward. Try not to slouch.  Hold the incentive spirometer in an upright position. INSTRUCTIONS FOR USE  1. Sit on the edge of your bed if possible, or sit up as far as you can in bed or on a chair. 2. Hold the incentive spirometer in an upright position. 3. Breathe out normally. 4. Place the mouthpiece in your mouth and seal your lips tightly around it. 5. Breathe in slowly and as deeply as possible, raising the piston or the ball toward the top of the column. 6. Hold your breath for 3-5 seconds or for as long as possible. Allow the piston or ball to fall to the bottom of the column. 7. Remove the mouthpiece from your mouth and breathe out normally. 8. Rest for a few seconds and repeat Steps 1 through 7 at least 10 times every 1-2 hours when you are awake. Take your time and take a few normal breaths between deep breaths. 9. The spirometer may include an indicator to show your best effort. Use the indicator as a goal to work toward during each repetition. 10. After each set of 10 deep breaths, practice coughing to be sure your lungs are clear. If you have an incision (the cut made at the time of surgery), support your incision when coughing by placing a pillow or rolled up towels firmly against it. Once you are able to get  out of bed, walk around indoors and cough well. You may stop using the incentive spirometer when instructed by your caregiver.  RISKS AND COMPLICATIONS  Take your time so you do not get dizzy or light-headed.  If you are in pain, you may need to take or ask for pain medication before doing incentive spirometry. It is harder to take a deep breath if you are having pain. AFTER USE  Rest and breathe slowly and easily.  It can be helpful to keep track of a log of your progress. Your caregiver can  provide you with a simple table to help with this. If you are using the spirometer at home, follow these instructions: [place] IF:   You are having difficultly using the spirometer.  You have trouble using the spirometer as often as instructed.  Your pain medication is not giving enough relief while using the spirometer.  You develop fever of 100.5 F (38.1 C) or higher. SEEK IMMEDIATE MEDICAL CARE IF:   You cough up bloody sputum that had not been present before.  You develop fever of 102 F (38.9 C) or greater.  You develop worsening pain at or near the incision site. MAKE SURE YOU:   Understand these instructions.  Will watch your condition.  Will get help right away if you are not doing well or get worse. Document Released: 09/22/2006 Document Revised: 08/04/2011 Document Reviewed: 11/23/2006 [facility] Patient Information 2014 [place], [place].   ________________________________________________________________________

## 2017-09-17 ENCOUNTER — Other Ambulatory Visit: Payer: Self-pay

## 2017-09-17 ENCOUNTER — Encounter (HOSPITAL_COMMUNITY)
Admission: RE | Admit: 2017-09-17 | Discharge: 2017-09-17 | Disposition: A | Discharge status: Home / Self Care | Payer: Medicare Other | Source: Ambulatory Visit | Attending: Orthopedic Surgery | Admitting: Orthopedic Surgery

## 2017-09-17 ENCOUNTER — Encounter (HOSPITAL_COMMUNITY): Payer: Self-pay

## 2017-09-17 DIAGNOSIS — Z01812 Encounter for preprocedural laboratory examination: Secondary | ICD-10-CM | POA: Insufficient documentation

## 2017-09-17 DIAGNOSIS — T8451XA Infection and inflammatory reaction due to internal right hip prosthesis, initial encounter: Secondary | ICD-10-CM | POA: Diagnosis not present

## 2017-09-17 HISTORY — DX: Headache, unspecified: R51.9

## 2017-09-17 HISTORY — DX: Headache: R51

## 2017-09-17 LAB — CBC
HEMATOCRIT: 39.5 % (ref 36.0–46.0)
HEMOGLOBIN: 13 g/dL (ref 12.0–15.0)
MCH: 30 pg (ref 26.0–34.0)
MCHC: 32.9 g/dL (ref 30.0–36.0)
MCV: 91 fL (ref 78.0–100.0)
Platelets: 132 10*3/uL — ABNORMAL LOW (ref 150–400)
RBC: 4.34 MIL/uL (ref 3.87–5.11)
RDW: 14 % (ref 11.5–15.5)
WBC: 5.6 10*3/uL (ref 4.0–10.5)

## 2017-09-17 LAB — BASIC METABOLIC PANEL
ANION GAP: 11 (ref 5–15)
BUN: 15 mg/dL (ref 6–20)
CO2: 22 mmol/L (ref 22–32)
Calcium: 9.1 mg/dL (ref 8.9–10.3)
Chloride: 108 mmol/L (ref 101–111)
Creatinine, Ser: 0.95 mg/dL (ref 0.44–1.00)
GFR calc Af Amer: >60 mL/min (ref >60)
GLUCOSE: 169 mg/dL — AB (ref 65–99)
POTASSIUM: 3.4 mmol/L — AB (ref 3.5–5.1)
Sodium: 141 mmol/L (ref 135–145)

## 2017-09-17 NOTE — Progress Notes (Signed)
Cbc and bmp done 09-17-17 routed to Dr. Alvan Dame via epic.

## 2017-09-18 LAB — SURGICAL PCR SCREEN
MRSA, PCR: POSITIVE — AB
STAPHYLOCOCCUS AUREUS: POSITIVE — AB

## 2017-09-20 NOTE — H&P (Signed)
[name] is an 57 y.o. female.    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 female complaining of right hip infection with a spacer for <1 year. Pain had continually increased since the beginning. X-rays in the clinic show an antibiotic spacer of the right hip. Pt has tried various conservative treatments which have failed to alleviate their symptoms.  [name] does wish to proceed with reimplantation of the right THA.  I did discuss with her that if there was still evidence of fracture that we would need to do the right thing and wash out the hip and place another spacer.  [name] states that [name] really doesn't want this to be the case, however understands and agrees that would be the best things if infection is still present.  Various options are discussed with the patient. Risks, benefits and expectations were discussed with the patient. Patient understand the risks, benefits and expectations and wishes to proceed with surgery.    PCP: System, Pcp Not In  D/C Plans:       Home with HHPT  Post-op Meds:       No Rx given  Tranexamic Acid:      To be given - IV   Decadron:      Is to be given  FYI:      ASA  Oxycodone  DME:   Pt already has equipment  PT:   No PT    PMH: Past Medical History:  Diagnosis Date  . Anxiety   . Bipolar affective disorder ([place])    ADHD AND SCHZIOPHRENIC--PT GOES TO [place]. BLANKMAN  . Blood transfusion without reported diagnosis   . Chronic pain syndrome   . Complication of anesthesia    STATES [name] WOKE UP TWICE DURING SURGERIES  . DJD (degenerative joint disease)   . GERD (gastroesophageal reflux disease)   . H/O hiatal hernia   . Headache   . Hepatitis C infection    STATES COMPLETED TREATMENT AND NO LONGER HAS INFECTION  . Hypertension   . Leukocytopenia   . Osteoarthritis   .  Osteoporosis   . Peptic ulcer disease   . Pneumonia    hx of x 3   . Polysubstance (excluding opioids) dependence ([place])   . Seizures (HCC)    SEIZURE AFTER BREATHING TX (ALBUTEROL) IN ER; ALSO PAST HX SEIZURES DURING DRUG DETOX  . Thrombocytopenia (HCC)     PSH: Past Surgical History:  Procedure Laterality Date  . ABDOMINAL HYSTERECTOMY    . ANTERIOR CERVICAL DECOMP/DISCECTOMY FUSION    . ANTERIOR HIP REVISION Right 05/12/2017   Procedure: Right total hip resection WITH ANTIBIOTIC SPACER;  Surgeon: Paralee Cancel, MD;  Location: [facility] ORS;  Service: Orthopedics;  Laterality: Right;  2 hrs  . APPENDECTOMY    . CHOLECYSTECTOMY    . ESOPHAGOGASTRODUODENOSCOPY N/A 04/18/2016   Procedure: ESOPHAGOGASTRODUODENOSCOPY (EGD);  Surgeon: Carol Ada, MD;  [facility] ENDOSCOPY;  Service: Endoscopy;  Laterality: N/A;  . Hyperectomy     due to obstructive ovarian cytst  . I&D EXTREMITY Right 04/14/2016   Procedure: IRRIGATION AND DEBRIDEMENT EXTREMITY;  Surgeon: Leanora Cover, MD;  Location: [place];  Service: Orthopedics;  Laterality: Right;  . JOINT REPLACEMENT  2012   RIGHT TOTAL HIP REPLACEMENT  . Right knee arthroscopic surgery    .  SPINE SURGERY     CERVIAL 6-7 SURGERY - FUSION  . TONSILECTOMY, ADENOIDECTOMY, BILATERAL MYRINGOTOMY AND TUBES    . TOTAL HIP ARTHROPLASTY Left 08/24/2012   Procedure: LEFT TOTAL HIP ARTHROPLASTY ANTERIOR APPROACH;  Surgeon: [name], MD;  Location: [facility] ORS;  Service: Orthopedics;  Laterality: Left;  . Total hip replacement      Social History:  reports that [name] has been smoking cigarettes.  [name] has a 5.00 pack-year smoking history. [name] has never used smokeless tobacco. [name] reports that [name] does not drink alcohol or use drugs.  Allergies:  Allergies  Allergen Reactions  . Albuterol Other (See Comments)    PT states [name] had seizures from an albuterol treatment and was hospitalized for it  . Aspirin Hives    Vomits blood  . Adhesive [Tape] Rash     Medications: No current facility-administered medications for this encounter.    Current Outpatient Medications  Medication Sig Dispense Refill  . ALPRAZolam (XANAX) 1 MG tablet Take 1 tablet (1 mg total) by mouth 2 (two) times daily as needed for anxiety. (Patient taking differently: Take 1 mg by mouth 2 (two) times daily. ) 30 tablet 0  . amphetamine-dextroamphetamine (ADDERALL) 20 MG tablet Take 20 mg by mouth 3 (three) times daily.     . calcium carbonate (TUMS - DOSED IN MG ELEMENTAL CALCIUM) 500 MG chewable tablet Chew 1-2 tablets by mouth daily as needed for indigestion or heartburn.    . desonide (DESOWEN) 0.05 % cream Apply 1 application topically daily as needed (itching).    Marland Kitchen ibuprofen (ADVIL,MOTRIN) 200 MG tablet Take 400 mg by mouth daily as needed for headache or moderate pain.    Marland Kitchen lisinopril (PRINIVIL,ZESTRIL) 5 MG tablet Take 1 tablet (5 mg total) by mouth daily. 30 tablet 0  . oxyCODONE 10 MG TABS Take 1-1.5 tablets (10-15 mg total) by mouth every 4 (four) hours as needed for moderate pain ((score 1 to 5)). (Patient taking differently: Take 10 mg by mouth 3 (three) times daily. ) 50 tablet 0  . promethazine (PHENERGAN) 12.5 MG tablet Take 1 tablet (12.5 mg total) by mouth every 6 (six) hours as needed for nausea or vomiting. 30 tablet 0  . QUEtiapine (SEROQUEL) 200 MG tablet Take 200 mg by mouth at bedtime.     . cyclobenzaprine (FLEXERIL) 10 MG tablet Take 1 tablet (10 mg total) by mouth 2 (two) times daily as needed for muscle spasms. (Patient not taking: Reported on 09/15/2017) 30 tablet 0  . docusate sodium (COLACE) 100 MG capsule Take 1 capsule (100 mg total) by mouth 2 (two) times daily. (Patient not taking: Reported on 09/15/2017) 10 capsule 0  . ferrous sulfate 325 (65 FE) MG tablet Take 1 tablet (325 mg total) by mouth 3 (three) times daily after meals. (Patient not taking: Reported on 09/15/2017) 30 tablet 3  . mupirocin ointment (BACTROBAN) 2 % Place 1 application  into the nose and under the fingernails 2 (two) times daily for 5 days. (Patient not taking: Reported on 09/15/2017) 22 g 0  . nicotine (NICODERM CQ - DOSED IN MG/24 HOURS) 14 mg/24hr patch Place 1 patch (14 mg total) onto the skin daily. (Patient not taking: Reported on 06/04/2017) 28 patch 0  . polyethylene glycol (MIRALAX / GLYCOLAX) packet Take 17 g by mouth 2 (two) times daily. (Patient not taking: Reported on 09/15/2017) 14 each 0  . rivaroxaban (XARELTO) 10 MG TABS tablet Take 1 tablet (10 mg total) by mouth  daily for 14 days. (Patient not taking: Reported on 09/15/2017) 14 tablet 0      Review of Systems  Constitutional: Negative.   HENT: Negative.   Eyes: Negative.   Respiratory: Negative.   Cardiovascular: Negative.   Gastrointestinal: Positive for heartburn.  Genitourinary: Negative.   Musculoskeletal: Positive for joint pain.  Skin: Negative.   Neurological: Positive for headaches.  Endo/Heme/Allergies: Negative.   Psychiatric/Behavioral: The patient is nervous/anxious.        Physical Exam  Constitutional: [name] is oriented to person, place, and time. [name] appears well-developed.  HENT:  Head: Normocephalic.  Eyes: Pupils are equal, round, and reactive to light.  Neck: Neck supple. No JVD present. No tracheal deviation present. No thyromegaly present.  Cardiovascular: Normal rate, regular rhythm and intact distal pulses.  Respiratory: Effort normal and breath sounds normal. No respiratory distress. [name] has no wheezes.  GI: Soft. There is no tenderness. There is no guarding.  Musculoskeletal:       Right hip: [name] exhibits decreased range of motion, decreased strength, tenderness, bony tenderness and laceration (healed previous incision). [name] exhibits no swelling.  Lymphadenopathy:    [name] has no cervical adenopathy.  Neurological: [name] is alert and oriented to person, place, and time.  Skin: Skin is warm and dry.  Psychiatric: [name] has a normal mood and affect.        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 Alvan Dame at [facility]. Risks benefits and expectations were discussed with the patient. Patient understand risks, benefits and expectations and wishes to proceed.   [place] Pugh Phoua Hoadley   PA-C  09/20/2017, 10:58 PM

## 2017-09-21 ENCOUNTER — Other Ambulatory Visit: Payer: Self-pay

## 2017-09-21 ENCOUNTER — Encounter (HOSPITAL_COMMUNITY): Payer: Self-pay | Admitting: *Deleted

## 2017-09-21 ENCOUNTER — Inpatient Hospital Stay (HOSPITAL_COMMUNITY): Payer: Medicare Other | Admitting: Certified Registered Nurse Anesthetist

## 2017-09-21 ENCOUNTER — Encounter (HOSPITAL_COMMUNITY)
Admission: RE | Disposition: A | Discharge status: Home Health | Payer: Self-pay | Source: Home / Self Care | Attending: Orthopedic Surgery

## 2017-09-21 ENCOUNTER — Inpatient Hospital Stay (HOSPITAL_COMMUNITY): Payer: Medicare Other

## 2017-09-21 ENCOUNTER — Inpatient Hospital Stay (HOSPITAL_COMMUNITY)
Admission: RE | Admit: 2017-09-21 | Discharge: 2017-09-22 | DRG: 470 | Disposition: A | Discharge status: Home Health | Payer: Medicare Other | Attending: Orthopedic Surgery | Admitting: Orthopedic Surgery

## 2017-09-21 DIAGNOSIS — Z886 Allergy status to analgesic agent status: Secondary | ICD-10-CM

## 2017-09-21 DIAGNOSIS — F17210 Nicotine dependence, cigarettes, uncomplicated: Secondary | ICD-10-CM | POA: Diagnosis present

## 2017-09-21 DIAGNOSIS — I10 Essential (primary) hypertension: Secondary | ICD-10-CM | POA: Diagnosis present

## 2017-09-21 DIAGNOSIS — M25551 Pain in right hip: Secondary | ICD-10-CM | POA: Diagnosis present

## 2017-09-21 DIAGNOSIS — F909 Attention-deficit hyperactivity disorder, unspecified type: Secondary | ICD-10-CM | POA: Diagnosis present

## 2017-09-21 DIAGNOSIS — Z96642 Presence of left artificial hip joint: Secondary | ICD-10-CM | POA: Diagnosis present

## 2017-09-21 DIAGNOSIS — Z96641 Presence of right artificial hip joint: Secondary | ICD-10-CM

## 2017-09-21 DIAGNOSIS — M810 Age-related osteoporosis without current pathological fracture: Secondary | ICD-10-CM | POA: Diagnosis present

## 2017-09-21 DIAGNOSIS — Z9109 Other allergy status, other than to drugs and biological substances: Secondary | ICD-10-CM

## 2017-09-21 DIAGNOSIS — Z981 Arthrodesis status: Secondary | ICD-10-CM | POA: Diagnosis not present

## 2017-09-21 DIAGNOSIS — G894 Chronic pain syndrome: Secondary | ICD-10-CM | POA: Diagnosis present

## 2017-09-21 DIAGNOSIS — T8451XA Infection and inflammatory reaction due to internal right hip prosthesis, initial encounter: Secondary | ICD-10-CM | POA: Diagnosis present

## 2017-09-21 DIAGNOSIS — Z888 Allergy status to other drugs, medicaments and biological substances status: Secondary | ICD-10-CM | POA: Diagnosis not present

## 2017-09-21 DIAGNOSIS — Z90710 Acquired absence of both cervix and uterus: Secondary | ICD-10-CM

## 2017-09-21 DIAGNOSIS — Z8711 Personal history of peptic ulcer disease: Secondary | ICD-10-CM | POA: Diagnosis not present

## 2017-09-21 DIAGNOSIS — Z96649 Presence of unspecified artificial hip joint: Secondary | ICD-10-CM

## 2017-09-21 DIAGNOSIS — F319 Bipolar disorder, unspecified: Secondary | ICD-10-CM | POA: Diagnosis present

## 2017-09-21 DIAGNOSIS — Z79899 Other long term (current) drug therapy: Secondary | ICD-10-CM | POA: Diagnosis not present

## 2017-09-21 HISTORY — PX: REIMPLANTATION OF TOTAL HIP: SHX6051

## 2017-09-21 LAB — TYPE AND SCREEN
ABO/RH(D): A POS
Antibody Screen: NEGATIVE

## 2017-09-21 SURGERY — REIMPLANTATION OF TOTAL HIP
Anesthesia: General | Site: Hip | Laterality: Right

## 2017-09-21 MED ORDER — PHENYLEPHRINE 40 MCG/ML (10ML) SYRINGE FOR IV PUSH (FOR BLOOD PRESSURE SUPPORT)
PREFILLED_SYRINGE | INTRAVENOUS | Status: DC | PRN
  Administered 2017-09-21 (×6): 80 ug via INTRAVENOUS

## 2017-09-21 MED ORDER — OXYCODONE HCL 5 MG PO TABS: 5.0000 mg | ORAL_TABLET | Freq: Once | ORAL | Status: DC | PRN

## 2017-09-21 MED ORDER — BISACODYL 10 MG RE SUPP: 10.0000 mg | Freq: Every day | RECTAL | Status: DC | PRN

## 2017-09-21 MED ORDER — ONDANSETRON HCL 4 MG/2ML IJ SOLN
INTRAMUSCULAR | Status: AC
  Filled 2017-09-21: qty 2

## 2017-09-21 MED ORDER — PHENOL 1.4 % MT LIQD: 1.0000 | OROMUCOSAL | Status: DC | PRN

## 2017-09-21 MED ORDER — TRANEXAMIC ACID 1000 MG/10ML IV SOLN
1000.0000 mg | INTRAVENOUS | Status: AC
  Administered 2017-09-21: 1000 mg via INTRAVENOUS
  Filled 2017-09-21: qty 1100

## 2017-09-21 MED ORDER — MEPERIDINE HCL 50 MG/ML IJ SOLN: 6.2500 mg | INTRAMUSCULAR | Status: DC | PRN

## 2017-09-21 MED ORDER — MENTHOL 3 MG MT LOZG: 1.0000 | LOZENGE | OROMUCOSAL | Status: DC | PRN

## 2017-09-21 MED ORDER — ONDANSETRON HCL 4 MG PO TABS: 4.0000 mg | ORAL_TABLET | Freq: Four times a day (QID) | ORAL | Status: DC | PRN

## 2017-09-21 MED ORDER — DEXAMETHASONE SODIUM PHOSPHATE 10 MG/ML IJ SOLN
10.0000 mg | Freq: Once | INTRAMUSCULAR | Status: AC
  Administered 2017-09-21: 10 mg via INTRAVENOUS

## 2017-09-21 MED ORDER — MIDAZOLAM HCL 5 MG/5ML IJ SOLN
INTRAMUSCULAR | Status: DC | PRN
  Administered 2017-09-21: 2 mg via INTRAVENOUS

## 2017-09-21 MED ORDER — CEPHALEXIN 500 MG PO CAPS: 500.0000 mg | ORAL_CAPSULE | Freq: Four times a day (QID) | ORAL | 0 refills | Status: AC

## 2017-09-21 MED ORDER — ONDANSETRON HCL 4 MG/2ML IJ SOLN: 4.0000 mg | Freq: Four times a day (QID) | INTRAMUSCULAR | Status: DC | PRN

## 2017-09-21 MED ORDER — VANCOMYCIN HCL 1000 MG IV SOLR
INTRAVENOUS | Status: AC
  Filled 2017-09-21: qty 1000

## 2017-09-21 MED ORDER — METOCLOPRAMIDE HCL 5 MG PO TABS: 5.0000 mg | ORAL_TABLET | Freq: Three times a day (TID) | ORAL | Status: DC | PRN

## 2017-09-21 MED ORDER — SODIUM CHLORIDE 0.9 % IR SOLN
Status: DC | PRN
  Administered 2017-09-21 (×2): 1000 mL

## 2017-09-21 MED ORDER — EPHEDRINE SULFATE-NACL 50-0.9 MG/10ML-% IV SOSY
PREFILLED_SYRINGE | INTRAVENOUS | Status: DC | PRN
  Administered 2017-09-21 (×4): 5 mg via INTRAVENOUS

## 2017-09-21 MED ORDER — OXYCODONE HCL 5 MG/5ML PO SOLN: 5.0000 mg | Freq: Once | ORAL | Status: DC | PRN

## 2017-09-21 MED ORDER — DOCUSATE SODIUM 100 MG PO CAPS
100.0000 mg | ORAL_CAPSULE | Freq: Two times a day (BID) | ORAL | Status: DC
  Administered 2017-09-21 – 2017-09-22 (×2): 100 mg via ORAL
  Filled 2017-09-21 (×2): qty 1

## 2017-09-21 MED ORDER — TRANEXAMIC ACID 1000 MG/10ML IV SOLN
1000.0000 mg | Freq: Once | INTRAVENOUS | Status: AC
  Administered 2017-09-21: 1000 mg via INTRAVENOUS
  Filled 2017-09-21: qty 1100

## 2017-09-21 MED ORDER — HYDROMORPHONE HCL 1 MG/ML IJ SOLN
INTRAMUSCULAR | Status: DC | PRN
  Administered 2017-09-21 (×5): .4 mg via INTRAVENOUS

## 2017-09-21 MED ORDER — CALCIUM CARBONATE ANTACID 500 MG PO CHEW
1.0000 | CHEWABLE_TABLET | Freq: Every day | ORAL | Status: DC | PRN
  Administered 2017-09-21: 400 mg via ORAL
  Filled 2017-09-21: qty 2

## 2017-09-21 MED ORDER — OXYCODONE HCL 10 MG PO TABS: 5.0000 mg | ORAL_TABLET | ORAL | 0 refills | Status: AC | PRN

## 2017-09-21 MED ORDER — OXYCODONE HCL 5 MG PO TABS
20.0000 mg | ORAL_TABLET | ORAL | Status: DC | PRN
  Administered 2017-09-22 (×2): 20 mg via ORAL
  Filled 2017-09-21: qty 4

## 2017-09-21 MED ORDER — NICOTINE 14 MG/24HR TD PT24: 14.0000 mg | MEDICATED_PATCH | Freq: Every day | TRANSDERMAL | Status: DC | PRN

## 2017-09-21 MED ORDER — CEFAZOLIN SODIUM-DEXTROSE 2-4 GM/100ML-% IV SOLN
2.0000 g | Freq: Four times a day (QID) | INTRAVENOUS | Status: AC
  Administered 2017-09-21 (×2): 2 g via INTRAVENOUS
  Filled 2017-09-21 (×2): qty 100

## 2017-09-21 MED ORDER — RIVAROXABAN 10 MG PO TABS: 10.0000 mg | ORAL_TABLET | Freq: Every day | ORAL | 0 refills | Status: AC

## 2017-09-21 MED ORDER — PROMETHAZINE HCL 25 MG/ML IJ SOLN: 6.2500 mg | INTRAMUSCULAR | Status: DC | PRN

## 2017-09-21 MED ORDER — QUETIAPINE FUMARATE 50 MG PO TABS: 200.0000 mg | ORAL_TABLET | Freq: Every day | ORAL | Status: DC

## 2017-09-21 MED ORDER — FERROUS SULFATE 325 (65 FE) MG PO TABS
325.0000 mg | ORAL_TABLET | Freq: Three times a day (TID) | ORAL | Status: DC
  Administered 2017-09-22 (×2): 325 mg via ORAL
  Filled 2017-09-21 (×2): qty 1

## 2017-09-21 MED ORDER — LACTATED RINGERS IV SOLN
INTRAVENOUS | Status: DC | PRN
Start: 2017-09-21 — End: 2017-09-21
  Administered 2017-09-21 (×2): via INTRAVENOUS

## 2017-09-21 MED ORDER — POLYETHYLENE GLYCOL 3350 17 G PO PACK
17.0000 g | PACK | Freq: Two times a day (BID) | ORAL | Status: DC
  Administered 2017-09-21 – 2017-09-22 (×2): 17 g via ORAL
  Filled 2017-09-21 (×2): qty 1

## 2017-09-21 MED ORDER — LIDOCAINE 2% (20 MG/ML) 5 ML SYRINGE
INTRAMUSCULAR | Status: DC | PRN
  Administered 2017-09-21: 80 mg via INTRAVENOUS

## 2017-09-21 MED ORDER — ACETAMINOPHEN 500 MG PO TABS: 1000.0000 mg | ORAL_TABLET | Freq: Three times a day (TID) | ORAL | 0 refills | Status: AC

## 2017-09-21 MED ORDER — HYDROMORPHONE HCL 2 MG/ML IJ SOLN
INTRAMUSCULAR | Status: AC
  Filled 2017-09-21: qty 1

## 2017-09-21 MED ORDER — PROPOFOL 10 MG/ML IV BOLUS
INTRAVENOUS | Status: AC
  Filled 2017-09-21: qty 20

## 2017-09-21 MED ORDER — RIVAROXABAN 10 MG PO TABS
10.0000 mg | ORAL_TABLET | ORAL | Status: DC
  Administered 2017-09-22: 10 mg via ORAL
  Filled 2017-09-21: qty 1

## 2017-09-21 MED ORDER — FENTANYL CITRATE (PF) 100 MCG/2ML IJ SOLN
INTRAMUSCULAR | Status: DC | PRN
  Administered 2017-09-21 (×5): 50 ug via INTRAVENOUS

## 2017-09-21 MED ORDER — POLYETHYLENE GLYCOL 3350 17 G PO PACK: 17.0000 g | PACK | Freq: Two times a day (BID) | ORAL | 0 refills | Status: AC

## 2017-09-21 MED ORDER — METOCLOPRAMIDE HCL 5 MG/ML IJ SOLN: 5.0000 mg | Freq: Three times a day (TID) | INTRAMUSCULAR | Status: DC | PRN

## 2017-09-21 MED ORDER — METHOCARBAMOL 500 MG PO TABS
500.0000 mg | ORAL_TABLET | Freq: Four times a day (QID) | ORAL | Status: DC | PRN
  Administered 2017-09-21 – 2017-09-22 (×3): 500 mg via ORAL
  Filled 2017-09-21 (×3): qty 1

## 2017-09-21 MED ORDER — METHOCARBAMOL 1000 MG/10ML IJ SOLN
500.0000 mg | Freq: Four times a day (QID) | INTRAVENOUS | Status: DC | PRN
  Administered 2017-09-21: 500 mg via INTRAVENOUS
  Filled 2017-09-21: qty 550

## 2017-09-21 MED ORDER — DEXAMETHASONE SODIUM PHOSPHATE 10 MG/ML IJ SOLN
10.0000 mg | Freq: Once | INTRAMUSCULAR | Status: AC
  Administered 2017-09-22: 10 mg via INTRAVENOUS
  Filled 2017-09-21: qty 1

## 2017-09-21 MED ORDER — MIDAZOLAM HCL 2 MG/2ML IJ SOLN
INTRAMUSCULAR | Status: AC
  Filled 2017-09-21: qty 2

## 2017-09-21 MED ORDER — ALPRAZOLAM 1 MG PO TABS
1.0000 mg | ORAL_TABLET | Freq: Two times a day (BID) | ORAL | Status: DC | PRN
Start: 2017-09-21 — End: 2017-09-22
  Administered 2017-09-21: 1 mg via ORAL
  Filled 2017-09-21: qty 1

## 2017-09-21 MED ORDER — CEFAZOLIN SODIUM-DEXTROSE 2-4 GM/100ML-% IV SOLN
2.0000 g | INTRAVENOUS | Status: AC
  Administered 2017-09-21: 2 g via INTRAVENOUS
  Filled 2017-09-21: qty 100

## 2017-09-21 MED ORDER — FERROUS SULFATE 325 (65 FE) MG PO TABS: 325.0000 mg | ORAL_TABLET | Freq: Three times a day (TID) | ORAL | 3 refills | Status: AC

## 2017-09-21 MED ORDER — PHENYLEPHRINE 40 MCG/ML (10ML) SYRINGE FOR IV PUSH (FOR BLOOD PRESSURE SUPPORT)
PREFILLED_SYRINGE | INTRAVENOUS | Status: AC
  Filled 2017-09-21: qty 10

## 2017-09-21 MED ORDER — DEXAMETHASONE SODIUM PHOSPHATE 10 MG/ML IJ SOLN
INTRAMUSCULAR | Status: AC
  Filled 2017-09-21: qty 1

## 2017-09-21 MED ORDER — CHLORHEXIDINE GLUCONATE 4 % EX LIQD: 60.0000 mL | Freq: Once | CUTANEOUS | Status: DC

## 2017-09-21 MED ORDER — ACETAMINOPHEN 500 MG PO TABS
1000.0000 mg | ORAL_TABLET | Freq: Four times a day (QID) | ORAL | Status: DC
  Administered 2017-09-21 – 2017-09-22 (×3): 1000 mg via ORAL
  Filled 2017-09-21 (×3): qty 2

## 2017-09-21 MED ORDER — VANCOMYCIN HCL 1000 MG IV SOLR
INTRAVENOUS | Status: DC | PRN
  Administered 2017-09-21: 1000 mg

## 2017-09-21 MED ORDER — METHOCARBAMOL 500 MG PO TABS: 500.0000 mg | ORAL_TABLET | Freq: Four times a day (QID) | ORAL | 0 refills | Status: AC | PRN

## 2017-09-21 MED ORDER — ONDANSETRON HCL 4 MG/2ML IJ SOLN
INTRAMUSCULAR | Status: DC | PRN
  Administered 2017-09-21: 4 mg via INTRAVENOUS

## 2017-09-21 MED ORDER — ALUM & MAG HYDROXIDE-SIMETH 200-200-20 MG/5ML PO SUSP: 15.0000 mL | ORAL | Status: DC | PRN

## 2017-09-21 MED ORDER — STERILE WATER FOR IRRIGATION IR SOLN
Status: DC | PRN
  Administered 2017-09-21: 2000 mL

## 2017-09-21 MED ORDER — FENTANYL CITRATE (PF) 250 MCG/5ML IJ SOLN
INTRAMUSCULAR | Status: AC
  Filled 2017-09-21: qty 5

## 2017-09-21 MED ORDER — DIPHENHYDRAMINE HCL 12.5 MG/5ML PO ELIX: 12.5000 mg | ORAL_SOLUTION | ORAL | Status: DC | PRN

## 2017-09-21 MED ORDER — DOCUSATE SODIUM 100 MG PO CAPS: 100.0000 mg | ORAL_CAPSULE | Freq: Two times a day (BID) | ORAL | 0 refills | Status: AC

## 2017-09-21 MED ORDER — SODIUM CHLORIDE 0.9 % IR SOLN
Status: DC | PRN
  Administered 2017-09-21: 1000 mL

## 2017-09-21 MED ORDER — AMPHETAMINE-DEXTROAMPHETAMINE 20 MG PO TABS
20.0000 mg | ORAL_TABLET | Freq: Three times a day (TID) | ORAL | Status: DC
  Administered 2017-09-21 – 2017-09-22 (×2): 20 mg via ORAL
  Filled 2017-09-21 (×2): qty 1

## 2017-09-21 MED ORDER — LIDOCAINE 2% (20 MG/ML) 5 ML SYRINGE
INTRAMUSCULAR | Status: AC
  Filled 2017-09-21: qty 5

## 2017-09-21 MED ORDER — VANCOMYCIN HCL IN DEXTROSE 1-5 GM/200ML-% IV SOLN
1000.0000 mg | INTRAVENOUS | Status: AC
  Administered 2017-09-21: 1000 mg via INTRAVENOUS
  Filled 2017-09-21: qty 200

## 2017-09-21 MED ORDER — SODIUM CHLORIDE 0.9 % IV SOLN
INTRAVENOUS | Status: DC
  Administered 2017-09-21 – 2017-09-22 (×2): via INTRAVENOUS

## 2017-09-21 MED ORDER — EPHEDRINE 5 MG/ML INJ
INTRAVENOUS | Status: AC
  Filled 2017-09-21: qty 10

## 2017-09-21 MED ORDER — HYDROMORPHONE HCL 1 MG/ML IJ SOLN
0.2500 mg | INTRAMUSCULAR | Status: DC | PRN
  Administered 2017-09-21 (×2): 0.5 mg via INTRAVENOUS

## 2017-09-21 MED ORDER — PROPOFOL 10 MG/ML IV BOLUS
INTRAVENOUS | Status: DC | PRN
  Administered 2017-09-21: 180 mg via INTRAVENOUS

## 2017-09-21 MED ORDER — CELECOXIB 200 MG PO CAPS
200.0000 mg | ORAL_CAPSULE | Freq: Two times a day (BID) | ORAL | Status: DC
  Administered 2017-09-21 – 2017-09-22 (×2): 200 mg via ORAL
  Filled 2017-09-21 (×2): qty 1

## 2017-09-21 MED ORDER — HYDROMORPHONE HCL 1 MG/ML IJ SOLN
0.5000 mg | INTRAMUSCULAR | Status: DC | PRN
  Administered 2017-09-21: 1 mg via INTRAVENOUS
  Administered 2017-09-22: 0.5 mg via INTRAVENOUS
  Filled 2017-09-21 (×2): qty 1

## 2017-09-21 MED ORDER — HYDROMORPHONE HCL 1 MG/ML IJ SOLN
INTRAMUSCULAR | Status: AC
  Filled 2017-09-21: qty 2

## 2017-09-21 MED ORDER — MAGNESIUM CITRATE PO SOLN: 1.0000 | Freq: Once | ORAL | Status: DC | PRN

## 2017-09-21 MED ORDER — OXYCODONE HCL 5 MG PO TABS
10.0000 mg | ORAL_TABLET | ORAL | Status: DC | PRN
  Administered 2017-09-21 – 2017-09-22 (×2): 10 mg via ORAL
  Filled 2017-09-21 (×4): qty 2

## 2017-09-21 SURGICAL SUPPLY — 52 items
BAG SPEC THK2 15X12 ZIP CLS (MISCELLANEOUS) ×1
BAG ZIPLOCK 12X15 (MISCELLANEOUS) ×2 IMPLANT
BLADE SAW SGTL 11.0X1.19X90.0M (BLADE) IMPLANT
BLADE SAW SGTL 18X1.27X75 (BLADE) IMPLANT
BRUSH FEMORAL CANAL (MISCELLANEOUS) IMPLANT
COVER SURGICAL LIGHT HANDLE (MISCELLANEOUS) ×2 IMPLANT
CUP ACETAB 56MM (Orthopedic Implant) ×2 IMPLANT
DERMABOND ADVANCED (GAUZE/BANDAGES/DRESSINGS) ×1
DERMABOND ADVANCED .7 DNX12 (GAUZE/BANDAGES/DRESSINGS) ×1 IMPLANT
DRAPE ORTHO SPLIT 77X108 STRL (DRAPES) ×2
DRAPE POUCH INSTRU U-SHP 10X18 (DRAPES) ×2 IMPLANT
DRAPE SURG 17X11 SM STRL (DRAPES) ×2 IMPLANT
DRAPE SURG ORHT 6 SPLT 77X108 (DRAPES) ×2 IMPLANT
DRAPE U-SHAPE 47X51 STRL (DRAPES) ×2 IMPLANT
DRESSING AQUACEL AG SP 3.5X10 (GAUZE/BANDAGES/DRESSINGS) ×1 IMPLANT
DRSG AQUACEL AG SP 3.5X10 (GAUZE/BANDAGES/DRESSINGS) ×2
DURAPREP 26ML APPLICATOR (WOUND CARE) ×2 IMPLANT
ELECT BLADE TIP CTD 4 INCH (ELECTRODE) ×2 IMPLANT
ELECT REM PT RETURN 15FT ADLT (MISCELLANEOUS) ×2 IMPLANT
FACESHIELD WRAPAROUND (MASK) ×8 IMPLANT
GLOVE BIOGEL PI IND STRL 7.5 (GLOVE) ×1 IMPLANT
GLOVE BIOGEL PI IND STRL 8.5 (GLOVE) ×1 IMPLANT
GLOVE BIOGEL PI INDICATOR 7.5 (GLOVE) ×1
GLOVE BIOGEL PI INDICATOR 8.5 (GLOVE) ×1
GLOVE ORTHO TXT STRL SZ7.5 (GLOVE) ×4 IMPLANT
GLOVE SURG ORTHO 8.0 STRL STRW (GLOVE) ×4 IMPLANT
GOWN STRL REUS W/TWL LRG LVL3 (GOWN DISPOSABLE) ×6 IMPLANT
GOWN STRL REUS W/TWL XL LVL3 (GOWN DISPOSABLE) ×4 IMPLANT
GRAFT IC CHAMBER LG (Bone Implant) ×2 IMPLANT
HANDPIECE INTERPULSE COAX TIP (DISPOSABLE) ×2
HEAD FEM DLT TS CER 36X+5.0 (Head) ×2 IMPLANT
LINER NEUTRAL 52MMX36MMX56 (Hips) ×2 IMPLANT
MANIFOLD NEPTUNE II (INSTRUMENTS) ×2 IMPLANT
NS IRRIG 1000ML POUR BTL (IV SOLUTION) ×4 IMPLANT
POSITIONER SURGICAL ARM (MISCELLANEOUS) ×2 IMPLANT
SCREW 6.5MMX25MM (Screw) ×2 IMPLANT
SCREW 6.5MMX40MM (Screw) ×2 IMPLANT
SET HNDPC FAN SPRY TIP SCT (DISPOSABLE) ×1 IMPLANT
SUCTION FRAZIER HANDLE 10FR (MISCELLANEOUS) ×1
SUCTION TUBE FRAZIER 10FR DISP (MISCELLANEOUS) ×1 IMPLANT
SUT MNCRL AB 3-0 PS2 18 (SUTURE) ×2 IMPLANT
SUT STRATAFIX 0 PDS 27 VIOLET (SUTURE) ×2
SUT VIC AB 1 CT1 36 (SUTURE) ×6 IMPLANT
SUT VIC AB 2-0 CT1 27 (SUTURE) ×3
SUT VIC AB 2-0 CT1 TAPERPNT 27 (SUTURE) ×3 IMPLANT
SUT VLOC 180 0 24IN GS25 (SUTURE) ×4 IMPLANT
SUTURE STRATFX 0 PDS 27 VIOLET (SUTURE) ×1 IMPLANT
TOWEL OR 17X26 10 PK STRL BLUE (TOWEL DISPOSABLE) ×4 IMPLANT
TOWER CARTRIDGE SMART MIX (DISPOSABLE) IMPLANT
TRAY FOLEY CATH 14FR (SET/KITS/TRAYS/PACK) ×2 IMPLANT
WATER STERILE IRR 1000ML POUR (IV SOLUTION) ×4 IMPLANT
YANKAUER SUCT BULB TIP 10FT TU (MISCELLANEOUS) IMPLANT

## 2017-09-21 NOTE — Transfer of Care (Signed)
Immediate Anesthesia Transfer of Care Note  Patient: [name]  Procedure(s) Performed: REIMPLANTATION OF RIGHT TOTAL HIP (Right Hip)  Patient Location: PACU  Anesthesia Type:General  Level of Consciousness: drowsy  Airway & Oxygen Therapy: Patient Spontanous Breathing and Patient connected to face mask  Post-op Assessment: Report given to RN and Post -op Vital signs reviewed and stable  Post vital signs: Reviewed and stable  Last Vitals:  Vitals Value Taken Time  BP 146/83 09/21/2017 12:15 PM  Temp    Pulse 89 09/21/2017 12:18 PM  Resp    SpO2 100 % 09/21/2017 12:18 PM  Vitals shown include unvalidated device data.  Last Pain:  Vitals:   09/21/17 0756  TempSrc:   PainSc: 3       Patients Stated Pain Goal: 4 (03/24/12 1438)  Complications: No apparent anesthesia complications

## 2017-09-21 NOTE — Anesthesia Procedure Notes (Signed)
Procedure Name: LMA Insertion Date/Time: 09/21/2017 10:15 AM Performed by: Claudia Desanctis, CRNA Pre-anesthesia Checklist: Emergency Drugs available, Patient identified, Suction available and Patient being monitored Patient Re-evaluated:Patient Re-evaluated prior to induction Oxygen Delivery Method: Circle system utilized Preoxygenation: Pre-oxygenation with 100% oxygen Induction Type: IV induction Ventilation: Mask ventilation without difficulty LMA: LMA inserted LMA Size: 4.0 Number of attempts: 1 Placement Confirmation: positive ETCO2 and breath sounds checked- equal and bilateral Tube secured with: Tape Dental Injury: Teeth and Oropharynx as per pre-operative assessment

## 2017-09-21 NOTE — Brief Op Note (Signed)
09/21/2017  11:45 AM  PATIENT:  [name]  57 y.o. female  PRE-OPERATIVE DIAGNOSIS:  Status post resection right total hip infection  POST-OPERATIVE DIAGNOSIS:  Status post resection right total hip infection  PROCEDURE:  Procedure(s) with comments: REIMPLANTATION OF RIGHT TOTAL HIP (Right) - 120 mins  SURGEON:  Surgeon(s) and Role:    Paralee Cancel, MD - Primary  PHYSICIAN ASSISTANT: Danae Orleans, PA-C  ANESTHESIA:   general  EBL:  550 mL   BLOOD ADMINISTERED:none  DRAINS: none   LOCAL MEDICATIONS USED:  Vancomycin  SPECIMEN:  No Specimen  DISPOSITION OF SPECIMEN:  N/A  COUNTS:  YES  TOURNIQUET:  * No tourniquets in log *  DICTATION: .Other Dictation: Dictation Number [id number]  PLAN OF CARE: Admit to inpatient   PATIENT DISPOSITION:  PACU - hemodynamically stable.   Delay start of Pharmacological VTE agent (>24hrs) due to surgical blood loss or risk of bleeding: no

## 2017-09-21 NOTE — Discharge Instructions (Addendum)

## 2017-09-21 NOTE — Evaluation (Signed)
Physical Therapy Evaluation Patient Details Name: [name] MRN: [id number] DOB: 01-06-1961 Today's Date: 09/21/2017   History of Present Illness  s/p L THA reimplantation; PMH: bipolar, poly substance abuse, Hep C  Clinical Impression  Pt is s/p THA resulting in the deficits listed below (see PT Problem List). Pt requires cues for Safety and to maintain THP, she admits to being non-compliant with PWB in the past; will continue to follow; she is requesting HHPT at d/c which would be a good plan;  Pt will benefit from skilled PT to increase their independence and safety with mobility to allow discharge to the venue listed below.       Follow surgeon's recommendation for DC plan and follow-up therapies;Home health PT(wants HHPT)    Equipment Recommendations  None recommended by PT    Recommendations for Other Services       Precautions / Restrictions Precautions Precautions: Posterior Hip Precaution Booklet Issued: Yes (comment) Restrictions Weight Bearing Restrictions: Yes RLE Weight Bearing: Partial weight bearing RLE Partial Weight Bearing Percentage or Pounds: 50      Mobility  Bed Mobility Overal bed mobility: Needs Assistance Bed Mobility: Supine to Sit     Supine to sit: Min assist     General bed mobility comments: min assist and multi-modal cues to maintain THP  Transfers Overall transfer level: Needs assistance Equipment used: Rolling walker (2 wheeled) Transfers: Sit to/from Stand Sit to Stand: Min assist         General transfer comment: cues for THP, LLE position and hand placement  Ambulation/Gait Ambulation/Gait assistance: Min guard Ambulation Distance (Feet): 60 Feet Assistive device: Rolling walker (2 wheeled) Gait Pattern/deviations: Step-to pattern     General Gait Details: cues for step to gait and PWB, pt tends to amb with step through and incr wt on LLE  Stairs            Wheelchair Mobility    Modified Rankin (Stroke  Patients Only)       Balance                                             Pertinent Vitals/Pain Pain Assessment: 0-10 Pain Score: 9  Pain Location: left hip Pain Descriptors / Indicators: Sore Pain Intervention(s): Limited activity within patient's tolerance;Monitored during session;Premedicated before session;Repositioned    Home Living Family/patient expects to be discharged to:: Private residence Living Arrangements: Alone Available Help at Discharge: Family(dtr-in-law) Type of Home: Apartment Home Access: Level entry     Home Layout: One level Home Equipment: Walker - 4 wheels;Walker - 2 wheels;Crutches;Wheelchair - manual      Prior Function Level of Independence: Independent with assistive device(s)         Comments: using walker d/t pain     Hand Dominance        Extremity/Trunk Assessment   Upper Extremity Assessment Upper Extremity Assessment: Defer to OT evaluation    Lower Extremity Assessment Lower Extremity Assessment: LLE deficits/detail LLE Deficits / Details: ankle WFL, knee and hip grossly 3/5, limited by pain       Communication      Cognition Arousal/Alertness: Awake/alert Behavior During Therapy: WFL for tasks assessed/performed Overall Cognitive Status: No family/caregiver present to determine baseline cognitive functioning Area of Impairment: Attention;Safety/judgement(anticipate this is pt baseline)  Current Attention Level: Sustained     Safety/Judgement: Decreased awareness of safety;Decreased awareness of deficits     General Comments: easily distracted      General Comments      Exercises     Assessment/Plan    PT Assessment Patient needs continued PT services  PT Problem List Decreased strength;Decreased range of motion;Decreased activity tolerance;Decreased mobility;Decreased safety awareness;Decreased knowledge of use of DME;Pain;Decreased knowledge of precautions        PT Treatment Interventions DME instruction;Gait training;Functional mobility training;Therapeutic activities;Therapeutic exercise;Patient/family education    PT Goals (Current goals can be found in the Care Plan section)  Acute Rehab PT Goals Patient Stated Goal: home PT Goal Formulation: With patient Time For Goal Achievement: 09/28/17 Potential to Achieve Goals: Good    Frequency 7X/week   Barriers to discharge        Co-evaluation               AM-PAC PT "6 Clicks" Daily Activity  Outcome Measure Difficulty turning over in bed (including adjusting bedclothes, sheets and blankets)?: A Little Difficulty moving from lying on back to sitting on the side of the bed? : A Little Difficulty sitting down on and standing up from a chair with arms (e.g., wheelchair, bedside commode, etc,.)?: A Little Help needed moving to and from a bed to chair (including a wheelchair)?: A Little Help needed walking in hospital room?: A Little Help needed climbing 3-5 steps with a railing? : A Lot 6 Click Score: 17    End of Session Equipment Utilized During Treatment: Gait belt Activity Tolerance: Patient tolerated treatment well Patient left: with call bell/phone within reach;in chair;with chair alarm set   PT Visit Diagnosis: Difficulty in walking, not elsewhere classified (R26.2)    Time: 6440-3474 PT Time Calculation (min) (ACUTE ONLY): 17 min   Charges:   PT Evaluation $PT Eval Low Complexity: 1 Low     PT G CodesKenyon Ana, PT Pager: [phone] 09/21/2017   [facility] 09/21/2017, 5:17 PM

## 2017-09-21 NOTE — Anesthesia Postprocedure Evaluation (Signed)
Anesthesia Post Note  Patient: [name]  Procedure(s) Performed: REIMPLANTATION OF RIGHT TOTAL HIP (Right Hip)     Patient location during evaluation: PACU Anesthesia Type: General Level of consciousness: awake and alert Pain management: pain level controlled Vital Signs Assessment: post-procedure vital signs reviewed and stable Respiratory status: spontaneous breathing, nonlabored ventilation and respiratory function stable Cardiovascular status: blood pressure returned to baseline and stable Postop Assessment: no apparent nausea or vomiting Anesthetic complications: no    Last Vitals:  Vitals:   09/21/17 1300 09/21/17 1318  BP: 136/89 140/76  Pulse: 94 88  Resp: 20 18  Temp: 36.5 C (!) 36.4 C  SpO2: 100% 99%    Last Pain:  Vitals:   09/21/17 1300  TempSrc:   PainSc: Asleep                 Lynda Rainwater

## 2017-09-21 NOTE — Anesthesia Preprocedure Evaluation (Signed)
Anesthesia Evaluation  Patient identified by MRN, date of birth, ID band Patient awake    Reviewed: Allergy & Precautions, NPO status , Patient's Chart, lab work & pertinent test results  Airway Mallampati: I       Dental  (+) Edentulous Upper, Edentulous Lower   Pulmonary Current Smoker,    Pulmonary exam normal breath sounds clear to auscultation       Cardiovascular hypertension, Pt. on medications Normal cardiovascular exam Rhythm:Regular Rate:Normal     Neuro/Psych PSYCHIATRIC DISORDERS Anxiety Bipolar Disorder Schizophrenia    GI/Hepatic GERD  ,  Endo/Other  negative endocrine ROS  Renal/GU      Musculoskeletal   Abdominal Normal abdominal exam  (+)   Peds  Hematology  (+) anemia ,   Anesthesia Other Findings   Reproductive/Obstetrics                             Anesthesia Physical  Anesthesia Plan  ASA: II  Anesthesia Plan: General   Post-op Pain Management:    Induction: Intravenous  PONV Risk Score and Plan: 3 and Ondansetron, Dexamethasone and Midazolam  Airway Management Planned: LMA  Additional Equipment:   Intra-op Plan:   Post-operative Plan: Extubation in OR  Informed Consent: I have reviewed the patients History and Physical, chart, labs and discussed the procedure including the risks, benefits and alternatives for the proposed anesthesia with the patient or authorized representative who has indicated his/her understanding and acceptance.   Dental advisory given  Plan Discussed with: CRNA and Surgeon  Anesthesia Plan Comments:         Anesthesia Quick Evaluation

## 2017-09-22 LAB — CBC
HEMATOCRIT: 26.4 % — AB (ref 36.0–46.0)
HEMOGLOBIN: 8.6 g/dL — AB (ref 12.0–15.0)
MCH: 29.7 pg (ref 26.0–34.0)
MCHC: 32.6 g/dL (ref 30.0–36.0)
MCV: 91 fL (ref 78.0–100.0)
Platelets: 112 10*3/uL — ABNORMAL LOW (ref 150–400)
RBC: 2.9 MIL/uL — ABNORMAL LOW (ref 3.87–5.11)
RDW: 14.1 % (ref 11.5–15.5)
WBC: 6.9 10*3/uL (ref 4.0–10.5)

## 2017-09-22 LAB — BASIC METABOLIC PANEL
Anion gap: 9 (ref 5–15)
BUN: 14 mg/dL (ref 6–20)
CALCIUM: 8.2 mg/dL — AB (ref 8.9–10.3)
CHLORIDE: 105 mmol/L (ref 101–111)
CO2: 23 mmol/L (ref 22–32)
CREATININE: 0.79 mg/dL (ref 0.44–1.00)
GFR calc Af Amer: >60 mL/min (ref >60)
GFR calc non Af Amer: >60 mL/min (ref >60)
GLUCOSE: 139 mg/dL — AB (ref 65–99)
Potassium: 4.5 mmol/L (ref 3.5–5.1)
Sodium: 137 mmol/L (ref 135–145)

## 2017-09-22 MED ORDER — ALUM & MAG HYDROXIDE-SIMETH 200-200-20 MG/5ML PO SUSP
15.0000 mL | ORAL | Status: DC | PRN
  Administered 2017-09-22: 15 mL via ORAL
  Filled 2017-09-22: qty 30

## 2017-09-22 NOTE — Progress Notes (Signed)
Discharge planning, spoke with patient at bedside. Have chosen [facility] for [facility] PT, evaluate and treat. Contacted [facility] for referral. Needs a shower chair, contacted [facility] to deliver to room. Requesting a hospital bed, will await orders if indicated. [phone]

## 2017-09-22 NOTE — Progress Notes (Signed)
Discharge instructions provided at 1300. Any questions or concerns were addressed. Pt reports no further questions or concerns at this time.

## 2017-09-22 NOTE — Progress Notes (Signed)
Physical Therapy Treatment Patient Details Name: [name] MRN: [id number] DOB: August 07, 1960 Today's Date: 09/22/2017    History of Present Illness s/p R THA reimplantation; PMH: bipolar, poly substance abuse, Hep C    PT Comments    Pt continues to require safety cues and cues for posterior THP, pt is easily distracted (both internally and externally); no LOB during gait x 260' with RW, pt is anxious to d/c home; assited pt with donning shorts--oferred OT consult and pt declines stating she will do HHOT; will provide pt with hip kit;   Follow Up Recommendations  Follow surgeon's recommendation for DC plan and follow-up therapies;Home health PT     Equipment Recommendations  None recommended by PT(pt wants tub seat and hospital bed-defer to CM)    Recommendations for Other Services       Precautions / Restrictions Precautions Precautions: Posterior Hip;Fall Restrictions Weight Bearing Restrictions: No RLE Weight Bearing: Weight bearing as tolerated RLE Partial Weight Bearing Percentage or Pounds: 50 Other Position/Activity Restrictions: per Dr. Aurea Graff progress note today--WBAT    Mobility  Bed Mobility Overal bed mobility: Needs Assistance Bed Mobility: Supine to Sit;Sit to Supine     Supine to sit: Supervision Sit to supine: Supervision   General bed mobility comments:  multi-modal cues to maintain THP  Transfers Overall transfer level: Needs assistance   Transfers: Sit to/from Stand Sit to Stand: Supervision         General transfer comment: cues for THP, LLE position and hand placement  Ambulation/Gait Ambulation/Gait assistance: Min guard;Supervision Ambulation Distance (Feet): 260 Feet Assistive device: Rolling walker (2 wheeled) Gait Pattern/deviations: Step-to pattern;Step-through pattern     General Gait Details:  cues for safety with turns, avoid right hip internal rotation   Stairs             Wheelchair Mobility    Modified Rankin  (Stroke Patients Only)       Balance                                            Cognition Arousal/Alertness: Awake/alert Behavior During Therapy: WFL for tasks assessed/performed   Area of Impairment: Attention;Safety/judgement                   Current Attention Level: Sustained     Safety/Judgement: Decreased awareness of safety;Decreased awareness of deficits     General Comments: easily distracted, cues for THP      Exercises Total Joint Exercises Ankle Circles/Pumps: AROM;Both;10 reps Quad Sets: AROM;Both;10 reps    General Comments        Pertinent Vitals/Pain Pain Assessment: 0-10 Pain Score: 6  Pain Location: right hip Pain Descriptors / Indicators: Sore Pain Intervention(s): Monitored during session;Premedicated before session;Ice applied    Home Living                      Prior Function            PT Goals (current goals can now be found in the care plan section) Acute Rehab PT Goals Patient Stated Goal: home PT Goal Formulation: With patient Time For Goal Achievement: 09/28/17 Potential to Achieve Goals: Good Progress towards PT goals: Progressing toward goals    Frequency    7X/week      PT Plan Current plan remains appropriate    Co-evaluation  AM-PAC PT "6 Clicks" Daily Activity  Outcome Measure  Difficulty turning over in bed (including adjusting bedclothes, sheets and blankets)?: A Little Difficulty moving from lying on back to sitting on the side of the bed? : A Little Difficulty sitting down on and standing up from a chair with arms (e.g., wheelchair, bedside commode, etc,.)?: A Little Help needed moving to and from a bed to chair (including a wheelchair)?: A Little Help needed walking in hospital room?: A Little Help needed climbing 3-5 steps with a railing? : A Little 6 Click Score: 18    End of Session Equipment Utilized During Treatment: Gait belt Activity  Tolerance: Patient tolerated treatment well Patient left: in bed;with call bell/phone within reach;with bed alarm set   PT Visit Diagnosis: Difficulty in walking, not elsewhere classified (R26.2)     Time: 1010-1030 PT Time Calculation (min) (ACUTE ONLY): 20 min  Charges:  $Gait Training: 8-22 mins                    G CodesKenyon Ana, PT Pager: [phone] 09/22/2017    Kenyon Ana 09/22/2017, 11:36 AM

## 2017-09-22 NOTE — Progress Notes (Signed)
Patient ID: [name], female   DOB: November 28, 1960, 57 y.o.   MRN: [id number] Subjective: 1 Day Post-Op Procedure(s) (LRB): REIMPLANTATION OF RIGHT TOTAL HIP (Right)    Patient reports pain as moderate.  Wants to go home to her dog.  No events  Objective:   VITALS:   Vitals:   09/22/17 0201 09/22/17 0602  BP: 119/66 128/68  Pulse: 79 78  Resp: 17 19  Temp: 98 F (36.7 C)   SpO2: 100% 100%    Neurovascular intact Incision: dressing C/D/I  LABS No results for input(s): HGB, HCT, WBC, PLT in the last 72 hours.  Recent Labs    09/22/17 0637  NA 137  K 4.5  BUN 14  CREATININE 0.79  GLUCOSE 139*    No results for input(s): LABPT, INR in the last 72 hours.   Assessment/Plan: 1 Day Post-Op Procedure(s) (LRB): REIMPLANTATION OF RIGHT TOTAL HIP (Right)   Advance diet Up with therapy   Reviewed intra-operative findings and post-operative plans Antibiotics for long term suppression  Add diflucan prn for antibiotic induce vaginal yeast infections PWB to WBAT with walker as described to her due to acetabular bone stock RTC in 2 weeks

## 2017-09-22 NOTE — Op Note (Signed)
NAME:  [name], [name]                ACCOUNT NO.:  [id number]  MEDICAL RECORD NO.:  [id number]  LOCATION:                                 FACILITY:  PHYSICIAN:  Pietro Cassis. Alvan Dame, M.D.       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  PROCEDURE:  Reimplantation of right total hip.  COMPONENTS USED:  Size 56 Gription Pinnacle shell revision component, multi-hole with a 36 +4, 10-degree face-changing liner, 36 +5 Titanium sleeve insert head ball.  SURGEON:  Pietro Cassis. Alvan Dame, M.D.  ASSISTANT:  Danae Orleans, PA-C.  Note that Mr. Guinevere Scarlet was present for the entirety of the case from preoperative positioning, perioperative management of the operative extremity, general facilitation of the case and primary wound closure.  ANESTHESIA:  General.  SPECIMENS:  None.  COMPLICATIONS:  None.  DRAINS:  None.  BLOOD LOSS:  About 550 cc.  INDICATION FOR PROCEDURE:  The patient is a 57 year old female with history of bilateral total hip arthroplasties.  She presented to the office towards the end of last year with increasing pain in her right hip.  Workup was concerning for potential metallosis versus infection. Evaluation confirmed concern for infection.  We had resected her acetabular shell maintaining her femoral stem due to the affected joint space.  She was treated with IV antibiotics.  Antibiotics have been stopped for now probably 2 months or more.  She has been complaining of the pain.  Her labs including sedimentation, C-reactive protein have returned back to within the  normal range.  She at this point was interested to proceed with reimplantation.  The risks of recurrent infection and postoperative course were reviewed including concern for dislocation, neurovascular injury, need for future surgeries.  Consent was obtained for benefit of pain relief.  PROCEDURE IN DETAIL:  The patient was brought to the operative theater. 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  The right lower extremity was then prepped and draped in sterile fashion.  A time-out was performed identifying the patient, planned procedure and extremity.  Her old incision was utilized, extended slightly proximal and distal for exposure of normal anatomy.  Soft tissue plane was created.  The iliotibial band and gluteal fascia were then incised for posterior approach.  As we entered the posterior aspect of the hip, I excised all the synovium.  She had a blood-tinged fluid within the joint itself.  There was no active purulence.  Following exposure of the posterior two-thirds of the acetabulum including synovectomy, we dislocated the hip and removed the femoral head.  I then placed the trunnion onto the ilium and retracted the femur anteriorly.  At this point, further synovectomy exposure was carried out.  At this point, the old acetabular liner was removed.  I then used an osteotome and was able to remove the cement holding the acetabulum placed in total.  Due to previous exposure and knowledge of her hip as well as identified now she had basically no medial wall as exposed intrapelvic musculature was present.  The posterior wall was significantly eroded away as well from her past surgery as well as removal of the acetabulum.  I carefully reamed from a 51 reamer up to a 53 reamer.  I then used  a curette and removed fibrous tissue within areas into the ilium as well as into the pubis and ischium.  Once I felt like I had adequately debrided the bony surfaces, I opened up 15 cc of bone graft and reverse reamed first with a 54, then a 55 reamer, selected a 56 Pinnacle Gription revision shell, it was opened.  I then impacted it into position.  I had good initial scratch fit without any mobility concerns. I then placed two screws into the ilium with very good purchase.  At this point, I placed a trial liner, did a trial reduction.  Though, I felt like her cup position was in good position with about 35 to 40 degrees of abduction as well as about 15 degrees or so of forward flexion and her combined anteversion appeared to be around 45 to 50 degrees.  There was a little bit of concern in the sleep position with the leg abducted and internally rotated.  I thus trialed with +4, 10- degree face-changing liner and felt this provided a little more stability, not knowing how compliant she would be with the restrictions.  At this point, the trials were removed.  I placed vancomycin powder within the acetabulum and then impacted the final 36 +4, 10-degree face- changing liner with the lip portion at around 10 o'clock for this right hip.  It was then impacted with good rim fit.  Based on the trial reduction, a 36 +5 Delta ceramic ball with a Titanium sleeve insert was then impacted on clean and dried trunnion.  The hip had been irrigated with 3 liters of normal saline solution throughout the portion of the case and again at this point, I placed another portion of vancomycin powder into the deep tissues and then reapproximated the iliotibial band and gluteal fascia using #1 Vicryl and #1 Stratafix suture.  The remainder of the vancomycin powder was sprinkled into the superficial layer.  The remainder of the wound was closed with 2-0 Vicryl and a running Monocryl stitch.  The hip was then  cleaned, dried and dressed sterilely using surgical glue and Aquacel dressing.  She was then brought to the recovery room, extubated in stable condition tolerating the procedure well.  Findings were reviewed with family. Postoperatively, she will be partial weightbearing.  Based on her history of this infection, most likely we used chronic suppressive antibiotics on as long as tolerable.  Findings were reviewed with family.    Pietro Cassis Alvan Dame, M.D.    MDO/MEDQ  D:  09/21/2017  T:  09/22/2017  Job:  [id number]

## 2017-09-28 NOTE — Discharge Summary (Signed)
Physician Discharge Summary  Patient ID: [name] MRN: [id number] DOB/AGE: [birth year] 57 y.o.  Admit date: 09/21/2017 Discharge date: 09/22/2017   Procedures:  Procedure(s) (LRB): REIMPLANTATION OF RIGHT TOTAL HIP (Right)  Attending Physician:  Dr. 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 Anxiety   . Bipolar affective disorder ([place])    ADHD AND SCHZIOPHRENIC--PT GOES TO [facility]. BLANKMAN  . Blood transfusion without reported diagnosis   . Chronic pain syndrome   . Complication of anesthesia    STATES SHE WOKE UP TWICE DURING SURGERIES  . DJD (degenerative joint disease)   . GERD (gastroesophageal reflux disease)   . H/O hiatal hernia   . Headache   . Hepatitis C infection    STATES COMPLETED TREATMENT AND NO LONGER HAS INFECTION  . Hypertension   . Leukocytopenia   . Osteoarthritis   . Osteoporosis   . Peptic ulcer disease   . Pneumonia    hx of x 3   . Polysubstance (excluding opioids) dependence ([place])   . Seizures (HCC)    SEIZURE AFTER BREATHING TX (ALBUTEROL) IN ER; ALSO PAST HX SEIZURES DURING DRUG DETOX  . Thrombocytopenia (HCC)     HPI:    Pt is a 57 y.o. female complaining of right hip infection with a spacer for <1 year. Pain had continually increased since the beginning. X-rays in the clinic show an antibiotic spacer of the right hip. Pt has tried various conservative treatments which have failed to alleviate their symptoms.  She does wish to proceed with reimplantation of the right THA.  I did discuss with her that if there was still evidence of fracture that we would need to do the right thing and wash out the hip and place another spacer.  She states that she really doesn't want this to be the case, however understands and agrees that would be the best things if infection is still present.   Various options are discussed with the patient. Risks, benefits and expectations were discussed with the patient. Patient understand the risks, benefits and expectations and wishes to proceed with surgery.   PCP: System, Pcp Not In   Discharged Condition: good  Hospital Course:  Patient underwent the above stated procedure on 09/21/2017. Patient tolerated the procedure well and brought to the recovery room in good condition and subsequently to the floor.  POD #1 BP: 128/68 ; Pulse: 78 ; Temp: 98 F (36.7 C) ; Resp: 19 Patient reports pain as moderate.  Wants to go home to her dog.  No events. Neurovascular intact and incision: dressing C/D/I.   LABS  Basename    HGB     8.6  HCT     26.4    Discharge Exam: General appearance: alert, cooperative and no distress Extremities: Homans sign is negative, no sign of DVT, no edema, redness or tenderness in the calves or thighs and no ulcers, gangrene or trophic changes  Disposition:  Home with follow up in 2 weeks   Follow-up Information    Paralee Cancel, MD. Schedule an appointment as soon as possible for a visit in 2 weeks.   Specialty:  Orthopedic Surgery Contact information: [address] [place] [address] [phone]        Home, [facility] Follow up.   Specialty:  Home Health Services Why:  physical therapy Contact information: [address] [place] [address] [facility] Follow up.   Why:  shower chair Contact information: [address] [place] [address] [phone]           Discharge Instructions    Call MD / Call 911   Complete by:  As directed    If you experience chest pain or shortness of breath, CALL 911 and be transported to the hospital emergency room.  If you develope a fever above 101 F, pus (white drainage) or increased drainage or redness at the wound, or calf pain, call your surgeon's office.   Change dressing    Complete by:  As directed    Maintain surgical dressing until follow up in the clinic. If the edges start to pull up, may reinforce with tape. If the dressing is no longer working, may remove and cover with gauze and tape, but must keep the area dry and clean.  Call with any questions or concerns.   Constipation Prevention   Complete by:  As directed    Drink plenty of fluids.  Prune juice may be helpful.  You may use a stool softener, such as Colace (over the counter) 100 mg twice a day.  Use MiraLax (over the counter) for constipation as needed.   Diet - low sodium heart healthy   Complete by:  As directed    Discharge instructions   Complete by:  As directed    Maintain surgical dressing until follow up in the clinic. If the edges start to pull up, may reinforce with tape. If the dressing is no longer working, may remove and cover with gauze and tape, but must keep the area dry and clean.  Follow up in 2 weeks at [facility]. Call with any questions or concerns.   Increase activity slowly as tolerated   Complete by:  As directed    Partial weight bearing with assist device as directed.  50% right lower extremity.   Partial weight bearing   Complete by:  As directed    % Body Weight:  50%   Laterality:  right   Extremity:  Lower   TED hose   Complete by:  As directed    Use stockings (TED hose) for 2 weeks on both leg(s).  You may remove them at night for sleeping.      Allergies as of 09/22/2017      Reactions   Albuterol Other (See Comments)   PT states she had seizures from an albuterol treatment and was hospitalized for it   Aspirin Hives   Vomits blood   Adhesive [tape] Rash      Medication List    STOP taking these medications   cyclobenzaprine 10 MG tablet Commonly known as:  FLEXERIL   ibuprofen 200 MG tablet Commonly known as:  ADVIL,MOTRIN   mupirocin ointment 2 % Commonly known as:  BACTROBAN   nicotine 14 mg/24hr patch Commonly known as:   NICODERM CQ - dosed in mg/24 hours   Oxycodone HCl 10 MG Tabs     TAKE these medications   acetaminophen 500 MG tablet Commonly known as:  TYLENOL Take 2 tablets (1,000 mg total) by mouth every 8 (eight) hours.   ALPRAZolam 1 MG tablet Commonly known as:  XANAX Take 1 tablet (1 mg total) by mouth 2 (two) times daily as  needed for anxiety. What changed:  when to take this   amphetamine-dextroamphetamine 20 MG tablet Commonly known as:  ADDERALL Take 20 mg by mouth 3 (three) times daily.   calcium carbonate 500 MG chewable tablet Commonly known as:  TUMS - dosed in mg elemental calcium Chew 1-2 tablets by mouth daily as needed for indigestion or heartburn.   cephALEXin 500 MG capsule Commonly known as:  KEFLEX Take 1 capsule (500 mg total) by mouth 4 (four) times daily for 15 days.   desonide 0.05 % cream Commonly known as:  DESOWEN Apply 1 application topically daily as needed (itching).   docusate sodium 100 MG capsule Commonly known as:  COLACE Take 1 capsule (100 mg total) by mouth 2 (two) times daily.   ferrous sulfate 325 (65 FE) MG tablet Commonly known as:  FERROUSUL Take 1 tablet (325 mg total) by mouth 3 (three) times daily with meals. What changed:  when to take this   lisinopril 5 MG tablet Commonly known as:  PRINIVIL,ZESTRIL Take 1 tablet (5 mg total) by mouth daily.   methocarbamol 500 MG tablet Commonly known as:  ROBAXIN Take 1 tablet (500 mg total) by mouth every 6 (six) hours as needed for muscle spasms.   polyethylene glycol packet Commonly known as:  MIRALAX / GLYCOLAX Take 17 g by mouth 2 (two) times daily.   promethazine 12.5 MG tablet Commonly known as:  PHENERGAN Take 1 tablet (12.5 mg total) by mouth every 6 (six) hours as needed for nausea or vomiting.   QUEtiapine 200 MG tablet Commonly known as:  SEROQUEL Take 200 mg by mouth at bedtime.   rivaroxaban 10 MG Tabs tablet Commonly known as:  XARELTO Take 1 tablet (10 mg total) by  mouth daily for 14 days. What changed:  when to take this     ASK your doctor about these medications   Oxycodone HCl 10 MG Tabs Take 0.5-1 tablets (5-10 mg total) by mouth every 4 (four) hours as needed for up to 5 days for moderate pain or severe pain. Ask about: Should I take this medication?            Discharge Care Instructions  (From admission, onward)        Start     Ordered   09/22/17 0000  Change dressing    Comments:  Maintain surgical dressing until follow up in the clinic. If the edges start to pull up, may reinforce with tape. If the dressing is no longer working, may remove and cover with gauze and tape, but must keep the area dry and clean.  Call with any questions or concerns.   09/22/17 0939   09/22/17 0000  Partial weight bearing    Question Answer Comment  % Body Weight 50%   Laterality right   Extremity Lower      09/22/17 0939       Signed: West Pugh. Cyan [place]   PA-C  09/28/2017, 4:46 PM

## 2017-09-29 ENCOUNTER — Ambulatory Visit: Payer: Medicare Other | Admitting: Internal Medicine

## 2017-10-06 ENCOUNTER — Ambulatory Visit: Payer: Medicare Other | Admitting: Internal Medicine

## 2017-10-12 ENCOUNTER — Other Ambulatory Visit: Payer: Self-pay

## 2017-10-12 ENCOUNTER — Emergency Department (HOSPITAL_COMMUNITY): Payer: Medicare Other

## 2017-10-12 ENCOUNTER — Encounter (HOSPITAL_COMMUNITY): Payer: Self-pay | Admitting: Emergency Medicine

## 2017-10-12 ENCOUNTER — Emergency Department (HOSPITAL_COMMUNITY)
Admission: EM | Admit: 2017-10-12 | Discharge: 2017-10-12 | Disposition: A | Discharge status: Home / Self Care | Payer: Medicare Other | Attending: Emergency Medicine | Admitting: Emergency Medicine

## 2017-10-12 DIAGNOSIS — Y92009 Unspecified place in unspecified non-institutional (private) residence as the place of occurrence of the external cause: Secondary | ICD-10-CM | POA: Diagnosis not present

## 2017-10-12 DIAGNOSIS — F319 Bipolar disorder, unspecified: Secondary | ICD-10-CM | POA: Diagnosis not present

## 2017-10-12 DIAGNOSIS — Y9389 Activity, other specified: Secondary | ICD-10-CM | POA: Diagnosis not present

## 2017-10-12 DIAGNOSIS — Z79899 Other long term (current) drug therapy: Secondary | ICD-10-CM | POA: Insufficient documentation

## 2017-10-12 DIAGNOSIS — S79911A Unspecified injury of right hip, initial encounter: Secondary | ICD-10-CM | POA: Diagnosis present

## 2017-10-12 DIAGNOSIS — X58XXXA Exposure to other specified factors, initial encounter: Secondary | ICD-10-CM | POA: Diagnosis not present

## 2017-10-12 DIAGNOSIS — F17210 Nicotine dependence, cigarettes, uncomplicated: Secondary | ICD-10-CM | POA: Insufficient documentation

## 2017-10-12 DIAGNOSIS — S73014A Posterior dislocation of right hip, initial encounter: Secondary | ICD-10-CM | POA: Insufficient documentation

## 2017-10-12 DIAGNOSIS — F419 Anxiety disorder, unspecified: Secondary | ICD-10-CM | POA: Diagnosis not present

## 2017-10-12 DIAGNOSIS — Y998 Other external cause status: Secondary | ICD-10-CM | POA: Insufficient documentation

## 2017-10-12 DIAGNOSIS — Z96643 Presence of artificial hip joint, bilateral: Secondary | ICD-10-CM | POA: Insufficient documentation

## 2017-10-12 DIAGNOSIS — I10 Essential (primary) hypertension: Secondary | ICD-10-CM | POA: Insufficient documentation

## 2017-10-12 LAB — CBC WITH DIFFERENTIAL/PLATELET
BASOS ABS: 0 10*3/uL (ref 0.0–0.1)
BASOS PCT: 0 %
Eosinophils Absolute: 0.1 10*3/uL (ref 0.0–0.7)
Eosinophils Relative: 2 %
HEMATOCRIT: 57.6 % — AB (ref 36.0–46.0)
Hemoglobin: 19.1 g/dL — ABNORMAL HIGH (ref 12.0–15.0)
LYMPHS PCT: 21 %
Lymphs Abs: 0.6 10*3/uL — ABNORMAL LOW (ref 0.7–4.0)
MCH: 30.3 pg (ref 26.0–34.0)
MCHC: 33.2 g/dL (ref 30.0–36.0)
MCV: 91.4 fL (ref 78.0–100.0)
Monocytes Absolute: 0.2 10*3/uL (ref 0.1–1.0)
Monocytes Relative: 8 %
NEUTROS ABS: 2 10*3/uL (ref 1.7–7.7)
Neutrophils Relative %: 69 %
PLATELETS: 68 10*3/uL — AB (ref 150–400)
RBC: 6.3 MIL/uL — ABNORMAL HIGH (ref 3.87–5.11)
RDW: 14.3 % (ref 11.5–15.5)
WBC: 3 10*3/uL — AB (ref 4.0–10.5)

## 2017-10-12 LAB — BASIC METABOLIC PANEL
Anion gap: 10 (ref 5–15)
BUN: 16 mg/dL (ref 6–20)
CALCIUM: 9 mg/dL (ref 8.9–10.3)
CO2: 23 mmol/L (ref 22–32)
CREATININE: 0.84 mg/dL (ref 0.44–1.00)
Chloride: 109 mmol/L (ref 101–111)
Glucose, Bld: 109 mg/dL — ABNORMAL HIGH (ref 65–99)
Potassium: 3.4 mmol/L — ABNORMAL LOW (ref 3.5–5.1)
SODIUM: 142 mmol/L (ref 135–145)

## 2017-10-12 LAB — CK: Total CK: 195 U/L (ref 38–234)

## 2017-10-12 MED ORDER — PROPOFOL 10 MG/ML IV BOLUS
INTRAVENOUS | Status: AC | PRN
  Administered 2017-10-12: 70 mg via INTRAVENOUS
  Administered 2017-10-12 (×7): 15 mg via INTRAVENOUS

## 2017-10-12 MED ORDER — ONDANSETRON HCL 4 MG/2ML IJ SOLN
4.0000 mg | Freq: Once | INTRAMUSCULAR | Status: AC
  Administered 2017-10-12: 4 mg via INTRAVENOUS
  Filled 2017-10-12: qty 2

## 2017-10-12 MED ORDER — OXYCODONE HCL 5 MG PO TABS
5.0000 mg | ORAL_TABLET | Freq: Once | ORAL | Status: AC
  Administered 2017-10-12: 5 mg via ORAL
  Filled 2017-10-12: qty 1

## 2017-10-12 MED ORDER — ACETAMINOPHEN 500 MG PO TABS
1000.0000 mg | ORAL_TABLET | Freq: Once | ORAL | Status: AC
  Administered 2017-10-12: 1000 mg via ORAL
  Filled 2017-10-12: qty 2

## 2017-10-12 MED ORDER — MORPHINE SULFATE (PF) 4 MG/ML IV SOLN
4.0000 mg | Freq: Once | INTRAVENOUS | Status: AC
  Administered 2017-10-12: 4 mg via INTRAVENOUS
  Filled 2017-10-12: qty 1

## 2017-10-12 MED ORDER — KETOROLAC TROMETHAMINE 30 MG/ML IJ SOLN
15.0000 mg | Freq: Once | INTRAMUSCULAR | Status: AC
  Administered 2017-10-12: 15 mg via INTRAVENOUS
  Filled 2017-10-12: qty 1

## 2017-10-12 MED ORDER — PROPOFOL 10 MG/ML IV BOLUS
1.0000 mg/kg | Freq: Once | INTRAVENOUS | Status: AC
Start: 2017-10-12 — End: 2017-10-12
  Administered 2017-10-12: 70.3 mg via INTRAVENOUS
  Filled 2017-10-12: qty 20

## 2017-10-12 MED ORDER — FENTANYL CITRATE (PF) 100 MCG/2ML IJ SOLN
100.0000 ug | Freq: Once | INTRAMUSCULAR | Status: AC
  Administered 2017-10-12: 100 ug via INTRAVENOUS
  Filled 2017-10-12: qty 2

## 2017-10-12 NOTE — ED Notes (Signed)
CASE MANAGER ANGELA RN AT BEDSIDE. PT WILL GO HOME BY [facility]

## 2017-10-12 NOTE — ED Notes (Signed)
Bed: EL38 Expected date:  Expected time:  Means of arrival:  Comments: 55f hip and leg pain

## 2017-10-12 NOTE — ED Notes (Signed)
PT CALLED FOR BSC TO BE PICK UP. BSC WILL BE PICKED UP BY SON OR FRIEND. BSC LABELED AND PLACED AT NURSING STATION AT ROOM #16. [facility] TRANSPORTED PT HOME. IMMOBILIZER GIVEN AT DISCHARGE. WEDGE REMAINS INTACT. FEMALE URINAL GIVEN FOR PT COMFORT.

## 2017-10-12 NOTE — ED Triage Notes (Signed)
Pt arriving from home with right hip/leg pain as well as lower back pain on the right side x2 days. Pt had hip surgery 2 weeks ago. Right foot has good pedal pulse but is cool to touch.

## 2017-10-12 NOTE — ED Provider Notes (Signed)
[facility] DEPT Provider Note   CSN: [id number] Arrival date & time: 10/12/17  0219     History   Chief Complaint Chief Complaint  Patient presents with  . Leg Pain    HPI [name] is a 57 y.o. female.  57 yo F with a chief complaint of right hip pain.  This is been going on for the past couple days.  She says she stood up and she felt a pop and since then has had severe pain to her right hip and had difficulty walking.  She has been lying in bed and has not been eating and drinking with anticipation that she would need to have another surgery done.  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  She had a antibiotic spacer in and had a revision done approximately 2 weeks ago.  The patient states that she had a replacement with a functional hip.  She denies trauma.  The history is provided by the patient.  Leg Pain   This is a new problem. The current episode started 2 days ago. The problem occurs constantly. The problem has not changed since onset.The pain is present in the right hip. The quality of the pain is described as aching and sharp. The pain is at a severity of 8/10. The pain is moderate. Associated symptoms include limited range of motion. She has tried nothing for the symptoms. The treatment provided no relief.    Past Medical History:  Diagnosis Date  . Anxiety   . Bipolar affective disorder ([place])    ADHD AND SCHZIOPHRENIC--PT GOES TO [place]. BLANKMAN  . Blood transfusion without reported diagnosis   . Chronic pain syndrome   . Complication of anesthesia    STATES SHE WOKE UP TWICE DURING SURGERIES  . DJD (degenerative joint disease)   . GERD (gastroesophageal reflux disease)   . H/O hiatal hernia   . Headache   . Hepatitis C infection    STATES COMPLETED TREATMENT AND NO LONGER HAS INFECTION  . Hypertension   . Leukocytopenia   . Osteoarthritis   .  Osteoporosis   . Peptic ulcer disease   . Pneumonia    hx of x 3   . Polysubstance (excluding opioids) dependence ([place])   . Seizures (HCC)    SEIZURE AFTER BREATHING TX (ALBUTEROL) IN ER; ALSO PAST HX SEIZURES DURING DRUG DETOX  . Thrombocytopenia [facility])     Patient Active Problem List   Diagnosis Date Noted  . 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 Right hip spacer / infection 05/12/2017  . IVDU (intravenous drug user) 05/14/2016  . Polysubstance abuse ([place])   . Upper GI bleed   . Cirrhosis of liver without ascites ([place])   . MRSA bacteremia 04/15/2016  . Bipolar affective disorder ([place]) 04/15/2016  . History of bipolar disorder 10/05/2014  . History of cirrhosis 10/05/2014  . Essential hypertension 10/05/2014  . S/P left THA, AA 08/25/2012  . Expected blood loss anemia 08/25/2012  . Obesity (BMI 30-39.9) 08/25/2012  . Pancytopenia 09/08/2011  . Anemia 09/04/2011  . PUD (peptic ulcer disease)   . Thrombocytopenia ([place])   . Chronic hepatitis C without hepatic coma [facility])     Past Surgical History:  Procedure Laterality Date  . ABDOMINAL HYSTERECTOMY    . ANTERIOR CERVICAL DECOMP/DISCECTOMY FUSION    . ANTERIOR HIP REVISION Right 05/12/2017   Procedure: Right  total hip resection WITH ANTIBIOTIC SPACER;  Surgeon: Paralee Cancel, MD;  Location: [facility] ORS;  Service: Orthopedics;  Laterality: Right;  2 hrs  . APPENDECTOMY    . CHOLECYSTECTOMY    . ESOPHAGOGASTRODUODENOSCOPY N/A 04/18/2016   Procedure: ESOPHAGOGASTRODUODENOSCOPY (EGD);  Surgeon: Carol Ada, MD;  Location: [facility] ENDOSCOPY;  Service: Endoscopy;  Laterality: N/A;  . Hyperectomy     due to obstructive ovarian cytst  . I&D EXTREMITY Right 04/14/2016   Procedure: IRRIGATION AND DEBRIDEMENT EXTREMITY;  Surgeon: Leanora Cover, MD;  Location: [facility];  Service: Orthopedics;  Laterality: Right;  . JOINT REPLACEMENT  2012   RIGHT TOTAL HIP REPLACEMENT  . REIMPLANTATION OF TOTAL HIP Right 09/21/2017   Procedure:  REIMPLANTATION OF RIGHT TOTAL HIP;  Surgeon: Paralee Cancel, MD;  Location: [facility]S;  Service: Orthopedics;  Laterality: Right;  120 mins  . Right knee arthroscopic surgery    . SPINE SURGERY     CERVIAL 6-7 SURGERY - FUSION  . TONSILECTOMY, ADENOIDECTOMY, BILATERAL MYRINGOTOMY AND TUBES    . TOTAL HIP ARTHROPLASTY Left 08/24/2012   Procedure: LEFT TOTAL HIP ARTHROPLASTY ANTERIOR APPROACH;  Surgeon: [name], MD;  Location: [facility]S;  Service: Orthopedics;  Laterality: Left;  . Total hip replacement       OB History   None      Home Medications    Prior to Admission medications   Medication Sig Start Date End Date Taking? Authorizing Provider  ALPRAZolam Duanne Moron) 1 MG tablet Take 1 tablet (1 mg total) by mouth 2 (two) times daily as needed for anxiety. Patient taking differently: Take 1 mg by mouth 2 (two) times daily.  05/15/17  Yes Babish, Rodman Key, PA-C  amphetamine-dextroamphetamine (ADDERALL) 20 MG tablet Take 20 mg by mouth 3 (three) times daily.  01/09/17  Yes [provider]  calcium carbonate (TUMS - DOSED IN MG ELEMENTAL CALCIUM) 500 MG chewable tablet Chew 1-2 tablets by mouth daily as needed for indigestion or heartburn.   Yes [provider]  cephALEXin (KEFLEX) 500 MG capsule Take 500 mg by mouth 4 (four) times daily.   Yes [provider]  desonide (DESOWEN) 0.05 % cream Apply 1 application topically daily as needed (itching).   Yes [provider]  docusate sodium (COLACE) 100 MG capsule Take 1 capsule (100 mg total) by mouth 2 (two) times daily. Patient taking differently: Take 100 mg by mouth 2 (two) times daily as needed for mild constipation or moderate constipation.  09/21/17  Yes Babish, Rodman Key, PA-C  levothyroxine (SYNTHROID, LEVOTHROID) 25 MCG tablet Take 25 mcg by mouth daily before breakfast.   Yes [provider]  lisinopril (PRINIVIL,ZESTRIL) 5 MG tablet Take 1 tablet (5 mg total) by mouth daily. 08/14/14  Yes Baker,  Freeman Caldron, PA-C  Oxycodone HCl 10 MG TABS Take 5-10 mg by mouth every 4 (four) hours as needed (pain).  10/02/17  Yes [provider]  polyethylene glycol (MIRALAX / GLYCOLAX) packet Take 17 g by mouth 2 (two) times daily. Patient taking differently: Take 17 g by mouth daily as needed for mild constipation or moderate constipation.  09/21/17  Yes Babish, Rodman Key, PA-C  QUEtiapine (SEROQUEL) 200 MG tablet Take 200 mg by mouth at bedtime.  03/19/16  Yes [provider]  rivaroxaban (XARELTO) 10 MG TABS tablet Take 1 tablet (10 mg total) by mouth daily for 14 days. 09/24/17 10/12/17 Yes Babish, Rodman Key, PA-C  acetaminophen (TYLENOL) 500 MG tablet Take 2 tablets (1,000 mg total) by mouth  every 8 (eight) hours. Patient not taking: Reported on 10/12/2017 09/21/17   Danae Orleans, PA-C  ferrous sulfate (FERROUSUL) 325 (65 FE) MG tablet Take 1 tablet (325 mg total) by mouth 3 (three) times daily with meals. Patient not taking: Reported on 10/12/2017 09/21/17   Danae Orleans, PA-C  methocarbamol (ROBAXIN) 500 MG tablet Take 1 tablet (500 mg total) by mouth every 6 (six) hours as needed for muscle spasms. Patient not taking: Reported on 10/12/2017 09/21/17   Danae Orleans, PA-C  promethazine (PHENERGAN) 12.5 MG tablet Take 1 tablet (12.5 mg total) by mouth every 6 (six) hours as needed for nausea or vomiting. Patient not taking: Reported on 10/12/2017 06/04/17   Thayer Headings, MD    Family History Family History  Problem Relation Age of Onset  . Breast cancer Sister   . Cancer Sister   . Heart disease Mother   . Heart disease Father     Social History Social History   Tobacco Use  . Smoking status: Current Some Day Smoker    Packs/day: 0.25    Years: 20.00    Pack years: 5.00    Types: Cigarettes  . Smokeless tobacco: Never Used  . Tobacco comment: 4/day 05/15/16; a pack a week   Substance Use Topics  . Alcohol use: No  . Drug use: No    Comment: hx of years ago - crystal        Allergies   Albuterol; Aspirin; and Adhesive [tape]   Review of Systems Review of Systems  Constitutional: Negative for chills and fever.  HENT: Negative for congestion and rhinorrhea.   Eyes: Negative for redness and visual disturbance.  Respiratory: Negative for shortness of breath and wheezing.   Cardiovascular: Negative for chest pain and palpitations.  Gastrointestinal: Negative for nausea and vomiting.  Genitourinary: Negative for dysuria and urgency.  Musculoskeletal: Positive for arthralgias and gait problem. Negative for myalgias.  Skin: Negative for pallor and wound.  Neurological: Negative for dizziness and headaches.     Physical Exam Updated Vital Signs BP (!) 169/86   Pulse (!) 105   Resp 11   Wt 70.3 kg (155 lb)   SpO2 100%   BMI 24.28 kg/m   Physical Exam  Constitutional: She is oriented to person, place, and time. She appears well-developed and well-nourished. No distress.  HENT:  Head: Normocephalic and atraumatic.  Eyes: Pupils are equal, round, and reactive to light. EOM are normal.  Neck: Normal range of motion. Neck supple.  Cardiovascular: Normal rate and regular rhythm. Exam reveals no gallop and no friction rub.  No murmur heard. Pulmonary/Chest: Effort normal. She has no wheezes. She has no rales.  Abdominal: Soft. She exhibits no distension. There is no tenderness.  Musculoskeletal: She exhibits no edema or tenderness.  Shortened RLE, PMS intact distally.  Pain with internal and external rotation.   Neurological: She is alert and oriented to person, place, and time.  Skin: Skin is warm and dry. She is not diaphoretic.  Psychiatric: She has a normal mood and affect. Her behavior is normal.  Nursing note and vitals reviewed.    ED Treatments / Results  Labs (all labs ordered are listed, but only abnormal results are displayed) Labs Reviewed  CBC WITH DIFFERENTIAL/PLATELET - Abnormal; Notable for the following components:       Result Value   WBC 3.0 (*)    RBC 6.30 (*)    Hemoglobin 19.1 (*)    HCT 57.6 (*)  Platelets 68 (*)    Lymphs Abs 0.6 (*)    All other components within normal limits  BASIC METABOLIC PANEL - Abnormal; Notable for the following components:   Potassium 3.4 (*)    Glucose, Bld 109 (*)    All other components within normal limits    EKG None  Radiology Dg Hip Port Unilat W Or Wo Pelvis 1 View Right  Result Date: 10/12/2017 CLINICAL DATA:  Right hip dislocation, postreduction. EXAM: DG HIP (WITH OR WITHOUT PELVIS) 1V PORT RIGHT COMPARISON:  Pre reduction radiographs earlier this day. FINDINGS: Femoral component of the right hip arthroplasty is now appropriately seated in the acetabular component. Small osseous densities adjacent to the superior acetabular cup may be chronic or small fracture fragments. Thinning of the acetabular cortex centrally. IMPRESSION: Femoral component of right hip arthroplasty is now appropriately seated in the acetabular component. Small osseous densities may represent possible fracture fragments adjacent to the acetabular cup. Electronically Signed   By: Jeb Levering M.D.   On: 10/12/2017 05:31   Dg Hip Unilat W Or Wo Pelvis 2-3 Views Right  Result Date: 10/12/2017 CLINICAL DATA:  Hip pain for 2 days EXAM: DG HIP (WITH OR WITHOUT PELVIS) 2-3V RIGHT COMPARISON:  04/20/2017 FINDINGS: Status post bilateral hip replacements with normal alignment on the left. Superior dislocation of the right femoral component with respect to the acetabular cup. No definitive fracture is seen. IMPRESSION: Status post bilateral hip replacements with superior dislocation of right femoral component with respect to the acetabular cup Electronically Signed   By: Donavan Foil M.D.   On: 10/12/2017 03:36    Procedures .Sedation Date/Time: 10/12/2017 5:00 AM Performed by: Deno Etienne, DO Authorized by: Deno Etienne, DO   Consent:    Consent obtained:  Verbal and written   Consent given  by:  Patient Universal protocol:    Immediately prior to procedure a time out was called: yes     Patient identity confirmation method:  Anonymous protocol, patient vented/unresponsive and verbally with patient Pre-sedation assessment:    Time since last food or drink:  48hr   ASA classification: class 2 - patient with mild systemic disease     Neck mobility: normal     Mouth opening:  2 finger widths   Thyromental distance:  3 finger widths   Mallampati score:  I - soft palate, uvula, fauces, pillars visible   Pre-sedation assessments completed and reviewed: airway patency, cardiovascular function, hydration status, mental status, nausea/vomiting, pain level, respiratory function and temperature   Immediate pre-procedure details:    Reviewed: vital signs   Procedure details (see MAR for exact dosages):    Preoxygenation:  Nasal cannula   Sedation:  Propofol   Analgesia:  Fentanyl   Intra-procedure monitoring:  Blood pressure monitoring, cardiac monitor, continuous capnometry, continuous pulse oximetry, frequent LOC assessments and frequent vital sign checks   Intra-procedure events: none     Total Provider sedation time (minutes):  40 Post-procedure details:    Post-sedation assessments completed and reviewed: airway patency, cardiovascular function, hydration status, mental status, nausea/vomiting, pain level, respiratory function and temperature     Patient is stable for discharge or admission: yes     Patient tolerance:  Tolerated well, no immediate complications Reduction of dislocation Date/Time: 10/12/2017 5:02 AM Performed by: Deno Etienne, DO Authorized by: Deno Etienne, DO  Consent: Verbal consent obtained. Written consent obtained. Consent given by: patient Patient understanding: patient states understanding of the procedure being performed Patient consent: the  patient's understanding of the procedure matches consent given Imaging studies: imaging studies available Required  items: required blood products, implants, devices, and special equipment available Patient identity confirmed: verbally with patient Local anesthesia used: no  Anesthesia: Local anesthesia used: no  Sedation: Patient sedated: yes Sedation type: moderate (conscious) sedation Sedatives: propofol Vitals: Vital signs were monitored during sedation.  Patient tolerance: Patient tolerated the procedure well with no immediate complications Comments: R hip dislocation reduction    (including critical care time) Procedure note: Ultrasound Guided Peripheral IV Ultrasound guided peripheral 1.88 inch angiocath IV placement performed by me. Indications: Nursing unable to place IV. Details: The antecubital fossa and upper arm were evaluated with a multifrequency linear probe. Patent brachial veins were noted. 1 attempt was made to cannulate a vein under realtime US guidance with successful cannulation of the vein and catheter placement. There is return of non-pulsatile dark red blood. The patient tolerated the procedure well without complications. Images archived electronically.  CPT codes: [phone] and [id number]  Medications Ordered in ED Medications  morphine 4 MG/ML injection 4 mg (4 mg Intravenous Given 10/12/17 0414)  ondansetron (ZOFRAN) injection 4 mg (4 mg Intravenous Given 10/12/17 0414)  propofol (DIPRIVAN) 10 mg/mL bolus/IV push 70.3 mg (70.3 mg Intravenous Given 10/12/17 0432)  fentaNYL (SUBLIMAZE) injection 100 mcg (100 mcg Intravenous Given 10/12/17 0429)  propofol (DIPRIVAN) 10 mg/mL bolus/IV push (15 mg Intravenous Given 10/12/17 0441)  ketorolac (TORADOL) 30 MG/ML injection 15 mg (15 mg Intravenous Given 10/12/17 0506)  acetaminophen (TYLENOL) tablet 1,000 mg (1,000 mg Oral Given 10/12/17 0506)  oxyCODONE (Oxy IR/ROXICODONE) immediate release tablet 5 mg (5 mg Oral Given 10/12/17 0506)     Initial Impression / Assessment and Plan / ED Course  I have reviewed the triage vital signs and the  nursing notes.  Pertinent labs & imaging results that were available during my care of the patient were reviewed by me and considered in my medical decision making (see chart for details).     57 yo F with right hip pain.  Clinically the patient has a dislocated hip.  Will obtain a plain film.  Viewed by me with R hip dislocation.  Discussed with Dr. Gladstone Lighter.  [place] for me to attempt relocation at bedside.   Reduced at bedside.  Will have follow up with surgeon in the office.   Patient with no one at home to help her, with limited mobility post reduction will have case management eval.   The patients results and plan were reviewed and discussed.   Any x-rays performed were independently reviewed by myself.   Differential diagnosis were considered with the presenting HPI.  Medications  morphine 4 MG/ML injection 4 mg (4 mg Intravenous Given 10/12/17 0414)  ondansetron (ZOFRAN) injection 4 mg (4 mg Intravenous Given 10/12/17 0414)  propofol (DIPRIVAN) 10 mg/mL bolus/IV push 70.3 mg (70.3 mg Intravenous Given 10/12/17 0432)  fentaNYL (SUBLIMAZE) injection 100 mcg (100 mcg Intravenous Given 10/12/17 0429)  propofol (DIPRIVAN) 10 mg/mL bolus/IV push (15 mg Intravenous Given 10/12/17 0441)  ketorolac (TORADOL) 30 MG/ML injection 15 mg (15 mg Intravenous Given 10/12/17 0506)  acetaminophen (TYLENOL) tablet 1,000 mg (1,000 mg Oral Given 10/12/17 0506)  oxyCODONE (Oxy IR/ROXICODONE) immediate release tablet 5 mg (5 mg Oral Given 10/12/17 0506)    Vitals:   10/12/17 0555 10/12/17 0600 10/12/17 0605 10/12/17 0610  BP: (!) 156/82 (!) 165/78 (!) 159/99 (!) 169/86  Pulse: (!) 102 (!) 104 (!) 107 (!) 105  Resp: (!) 22 [place])  22 (!) 25 11  SpO2: 100% 100% 100% 100%  Weight:        Final diagnoses:  Closed posterior dislocation of right hip, initial encounter [facility])      Final Clinical Impressions(s) / ED Diagnoses   Final diagnoses:  Closed posterior dislocation of right hip, initial encounter [facility])     ED Discharge Orders        [place]     10/12/17 0518    Face-to-face encounter (required for Medicare/Medicaid patients)    Comments:  I Cecilio Asper certify that this patient is under my care and that I, or a nurse practitioner or physician's assistant working with me, had a face-to-face encounter that meets the physician face-to-face encounter requirements with this patient on 10/12/2017. The encounter with the patient was in whole, or in part for the following medical condition(s) which is the primary reason for home health care (List medical condition): recent hip replacement with recent dislocation, limited mobility with limited help at home   10/12/17 [place], [place], DO 10/12/17 [phone]

## 2017-10-12 NOTE — ED Provider Notes (Signed)
Pt seen by Care management.  Additional home resources offered. Pt is ready for discharge now.  Updated pt and she agrees she is ready to go home.   Dorie Rank, MD 10/12/17 1002

## 2017-10-12 NOTE — Care Management Note (Signed)
Case Management Note  Patient Details  Name: [name] MRN: [id number] Date of Birth: 1961/02/08  CM consulted for [facility] services.  CM noted pt was D/C on previous admission 09/22/2017 with [facility].  CM spoke with pt who advised she was not ready for [facility] services last month but is ready for them now.  Pt also requested a 3-in-1.  CM contacted Ronalee Belts with [facility] who accepted pt for services.  CM contacted Santiago Glad with [facility] who brought the 3-in-1 to the pt's room.  Pt advises that someone will be able to come get the DME today since she is going home via [facility].  Updated Dr. Tomi Bamberger and Caryl Pina, RN.  No further CM needs noted at this time.  Expected Discharge Date:   10/12/2017               Expected Discharge Plan:  [place]  Discharge planning Services  CM Consult  Post Acute Care Choice:  Home Health, Durable Medical Equipment Choice offered to:  Patient  DME Arranged:  3-N-1 DME Agency:  [place]:  RN, PT, OT, Nurse's Aide, Social Work [employer] Agency:  [facility] [employer] [facility] [employer]  Status of Service:  Completed, signed off  Bernadene Garside, Benjaman Lobe, RN 10/12/2017, 9:25 AM

## 2017-10-12 NOTE — ED Notes (Signed)
PT TALKING ON THE CELL PHONE WITH A FRIEND

## 2017-10-12 NOTE — ED Notes (Signed)
PT WILL DISCHARGE BY [facility]. [facility] CALLED. PT ATTEMPTING TO GET [facility] HOME BY A FRIEND.

## 2017-10-12 NOTE — Discharge Instructions (Addendum)
Do not bend past 90 degrees.  Use the pillow inbetween your legs for comfort, do not cross your right leg across midline.  Try to not bear weight to the leg.

## 2017-11-23 ENCOUNTER — Ambulatory Visit: Payer: Medicaid Other | Admitting: Internal Medicine

## 2017-12-25 ENCOUNTER — Ambulatory Visit: Payer: Medicaid Other | Admitting: Internal Medicine

## 2018-01-04 ENCOUNTER — Ambulatory Visit: Payer: Medicaid Other | Admitting: Internal Medicine

## 2018-06-20 DIAGNOSIS — D649 Anemia, unspecified: Secondary | ICD-10-CM | POA: Diagnosis not present

## 2018-06-20 DIAGNOSIS — R269 Unspecified abnormalities of gait and mobility: Secondary | ICD-10-CM | POA: Diagnosis not present

## 2018-06-20 DIAGNOSIS — Z96649 Presence of unspecified artificial hip joint: Secondary | ICD-10-CM | POA: Diagnosis not present

## 2018-06-20 DIAGNOSIS — R5381 Other malaise: Secondary | ICD-10-CM | POA: Diagnosis not present

## 2018-06-20 DIAGNOSIS — J154 Pneumonia due to other streptococci: Secondary | ICD-10-CM | POA: Diagnosis not present

## 2018-06-20 DIAGNOSIS — T8450XA Infection and inflammatory reaction due to unspecified internal joint prosthesis, initial encounter: Secondary | ICD-10-CM | POA: Diagnosis not present

## 2018-07-06 IMAGING — CR DG HAND COMPLETE 3+V*R*
3 series · 3 of 3 positions shown · non-contrast
Comparison: None.

CLINICAL DATA: Left arm swelling.  Reported flea bites.

EXAM:
RIGHT HAND - COMPLETE 3+ VIEW

[x hand pa right]
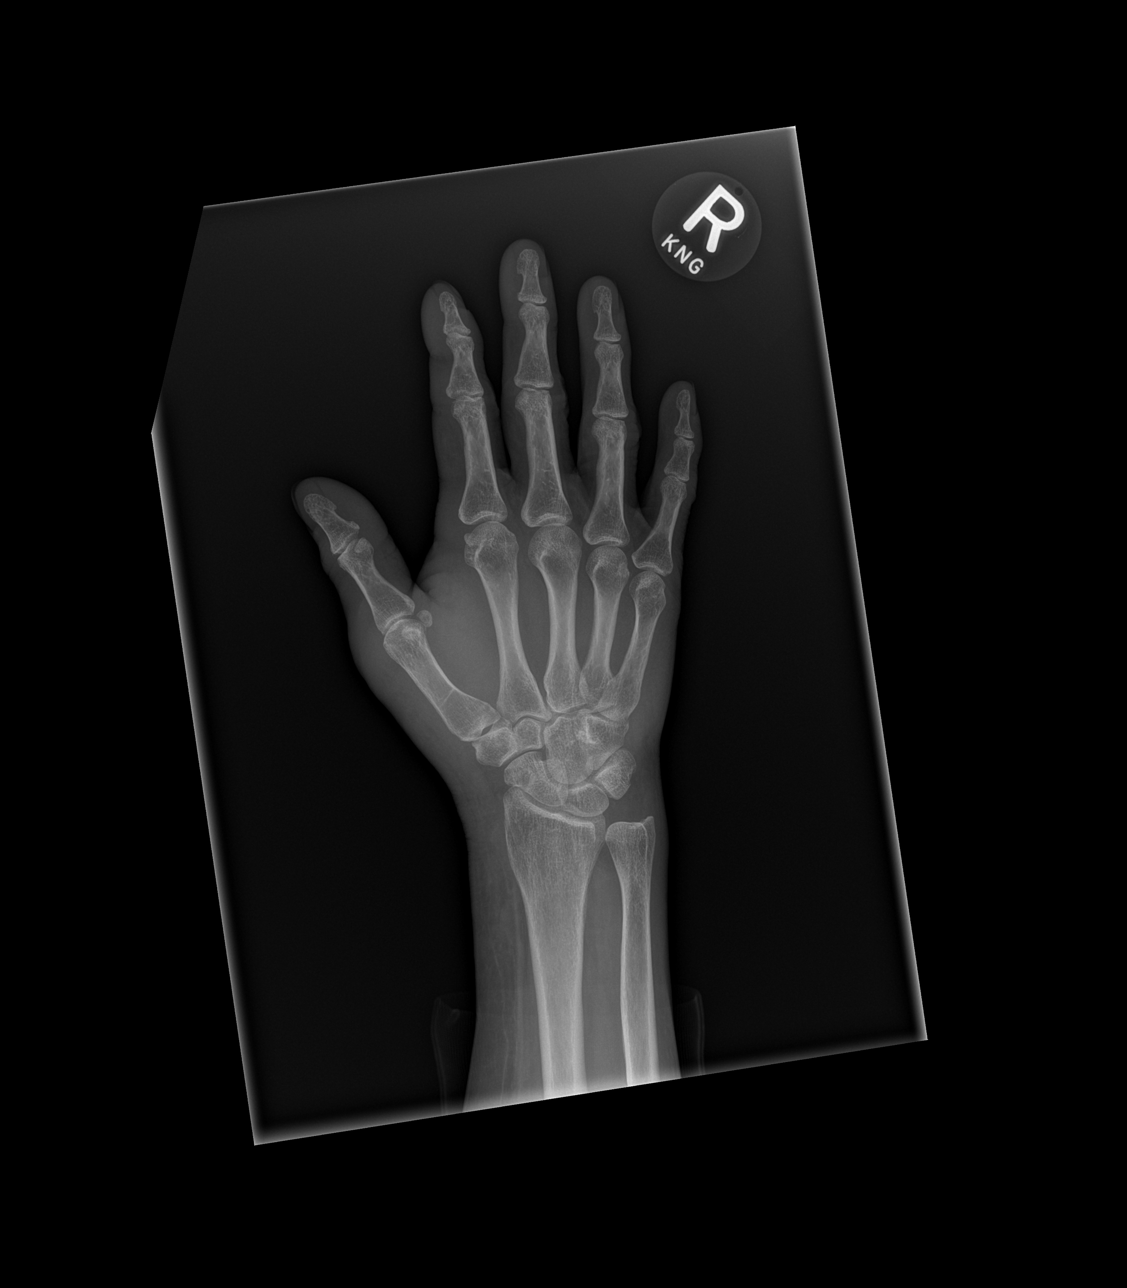

[x hand obl right]
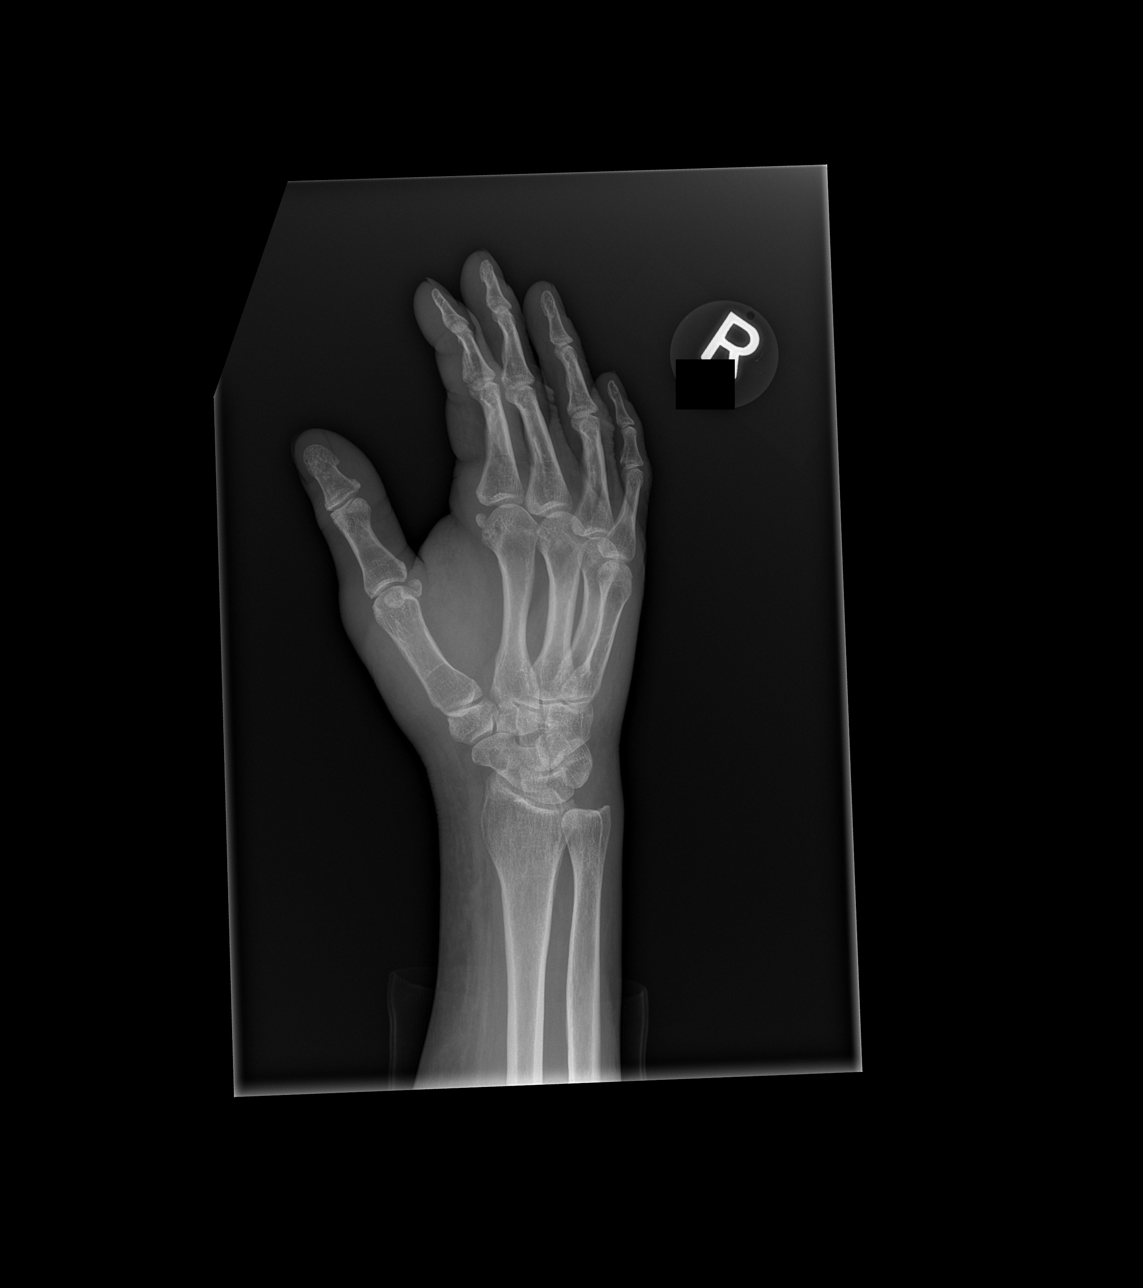

[x hand lat right]
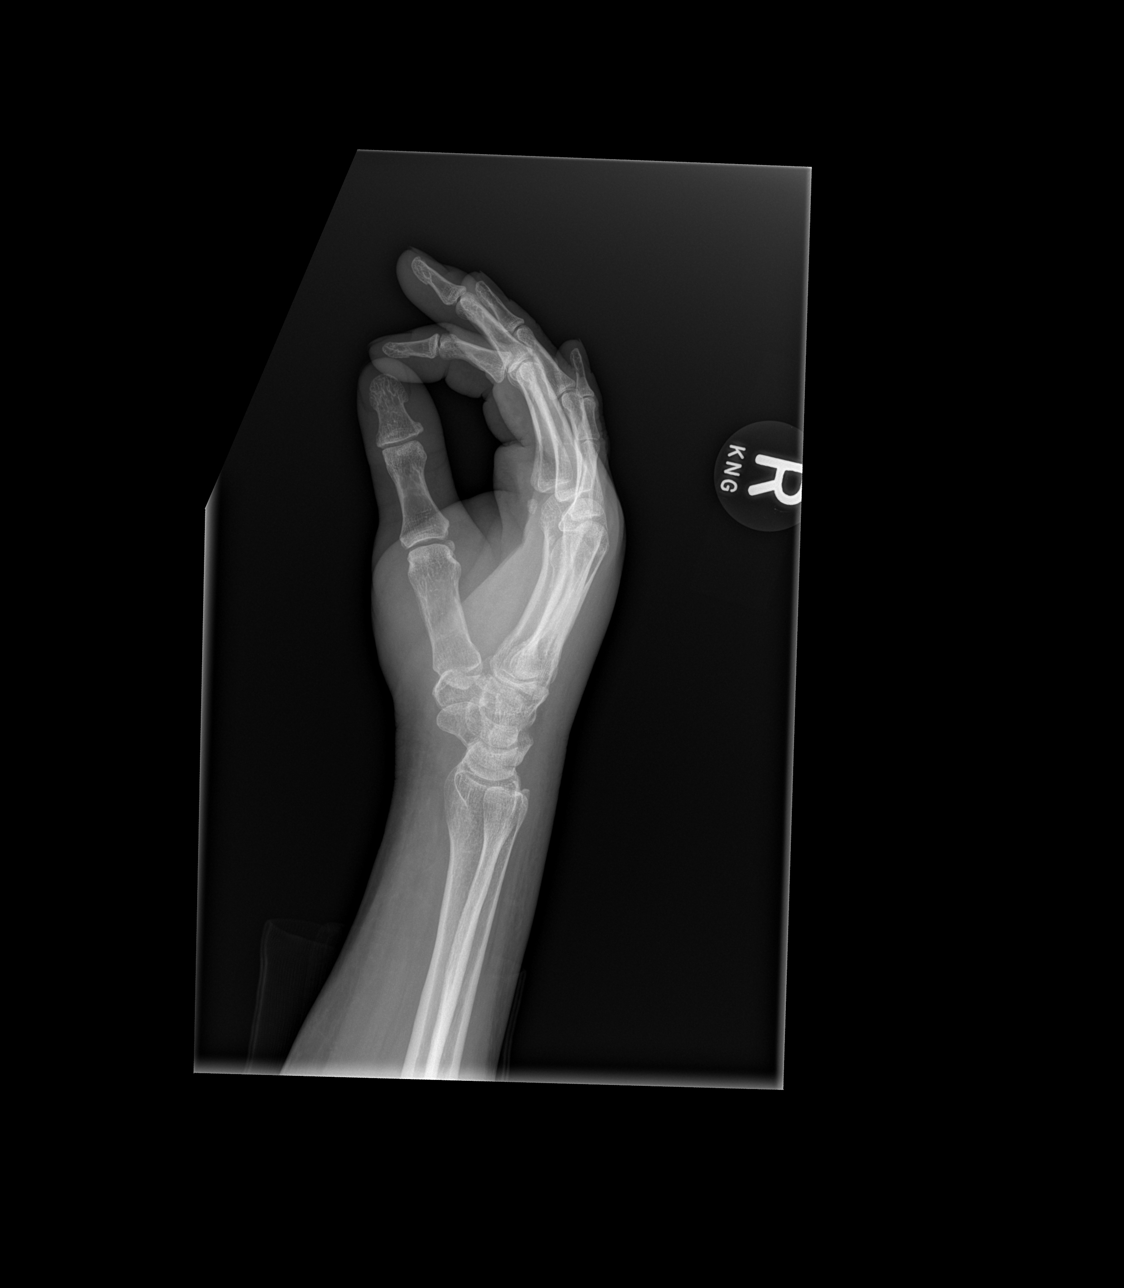

[3 of 3 positions shown; findings below may reference images not displayed]

FINDINGS: There is no evidence of fracture or dislocation. There is no
evidence of arthropathy or other focal bone abnormality. Soft
tissues are unremarkable.
IMPRESSION: No focal osseous abnormality.

## 2018-07-06 IMAGING — CR DG CHEST 2V
2 series · 2 of 2 positions shown · non-contrast
Comparison: 10/06/2015

CLINICAL DATA: Left arm swelling since yesterday. The irritated
sleeve bites on the hands. Skin abscess. Swelling in the hand and
arm. Redness. Right lower rib cage pain after coughing yesterday.

EXAM:
CHEST  2 VIEW

[w chest lat]
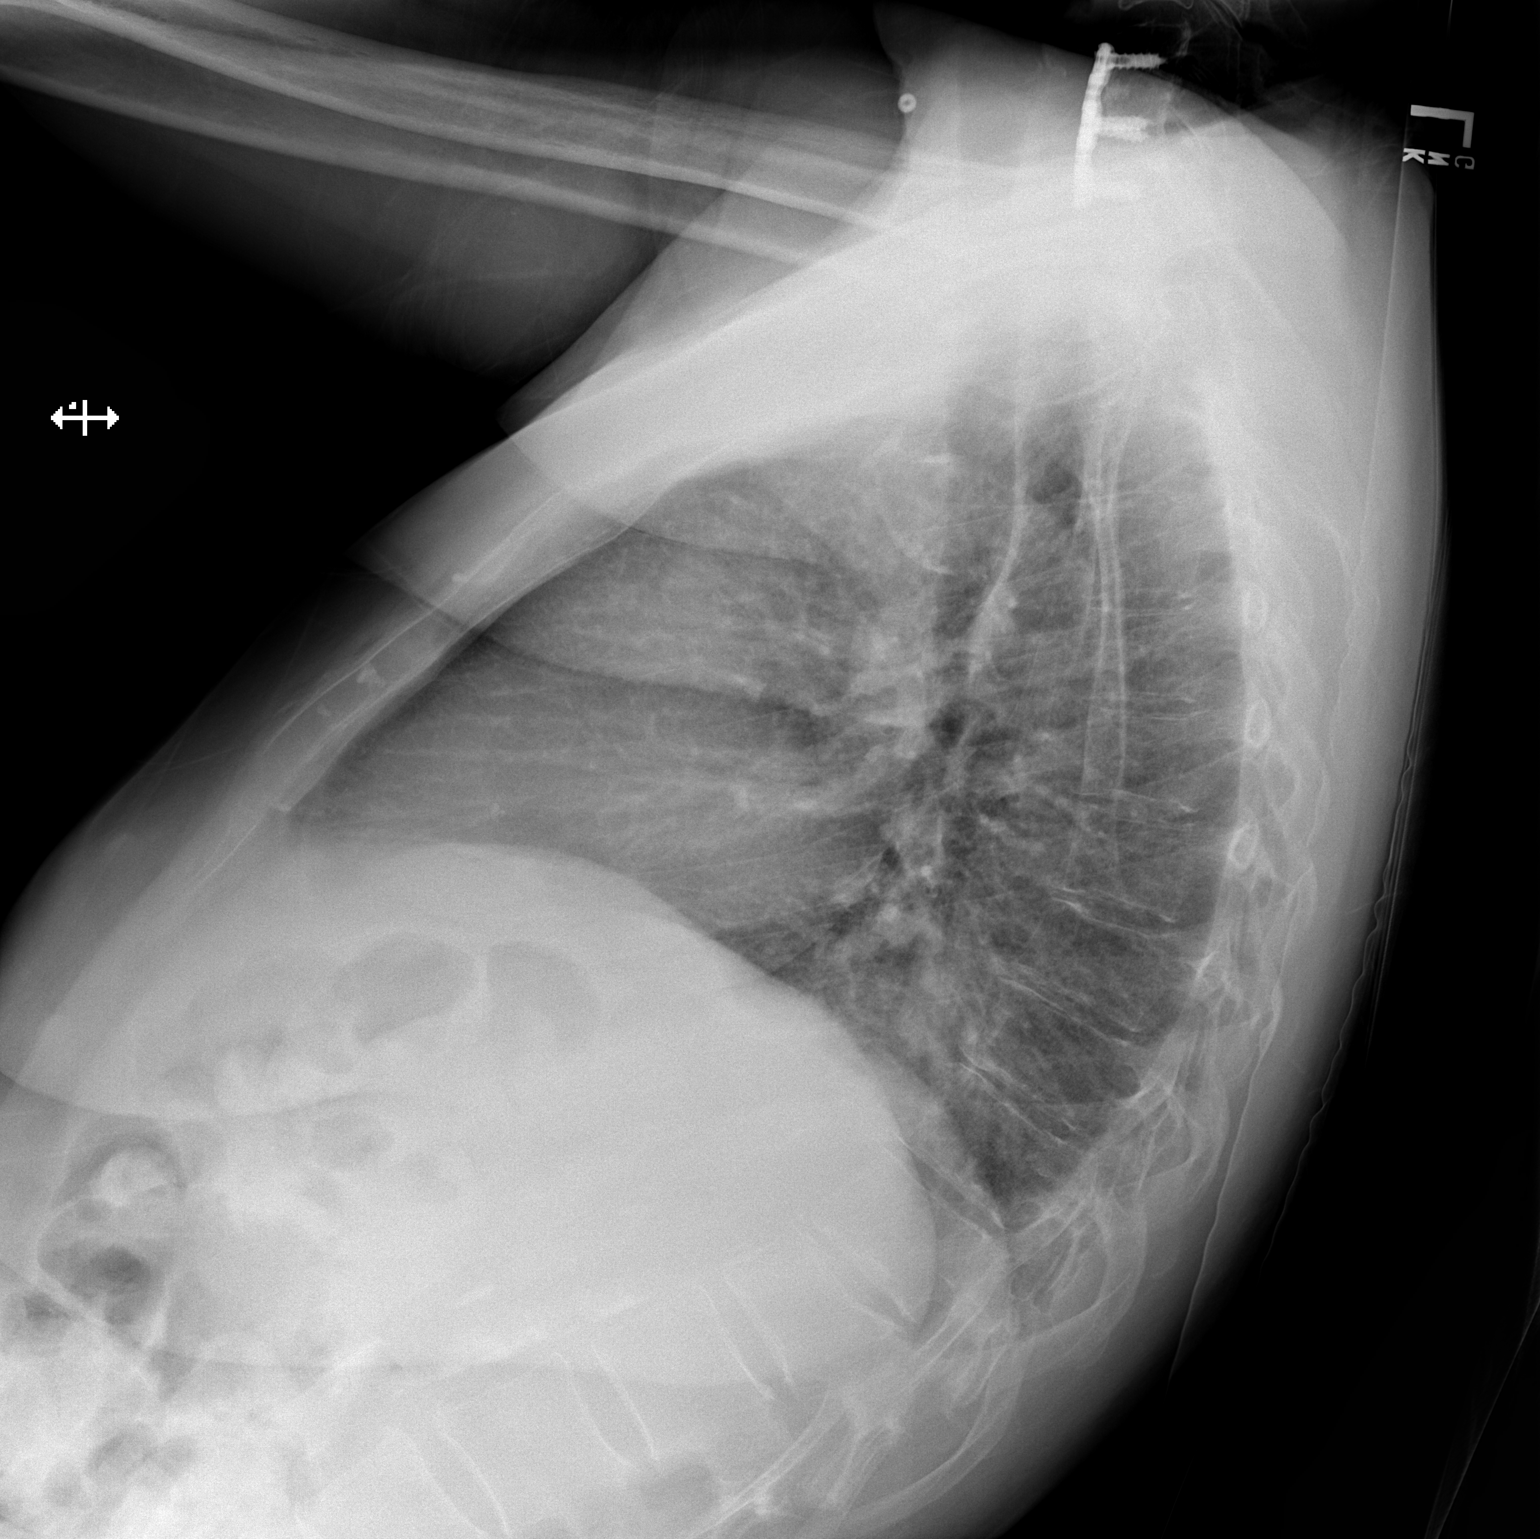

[x chest ap]
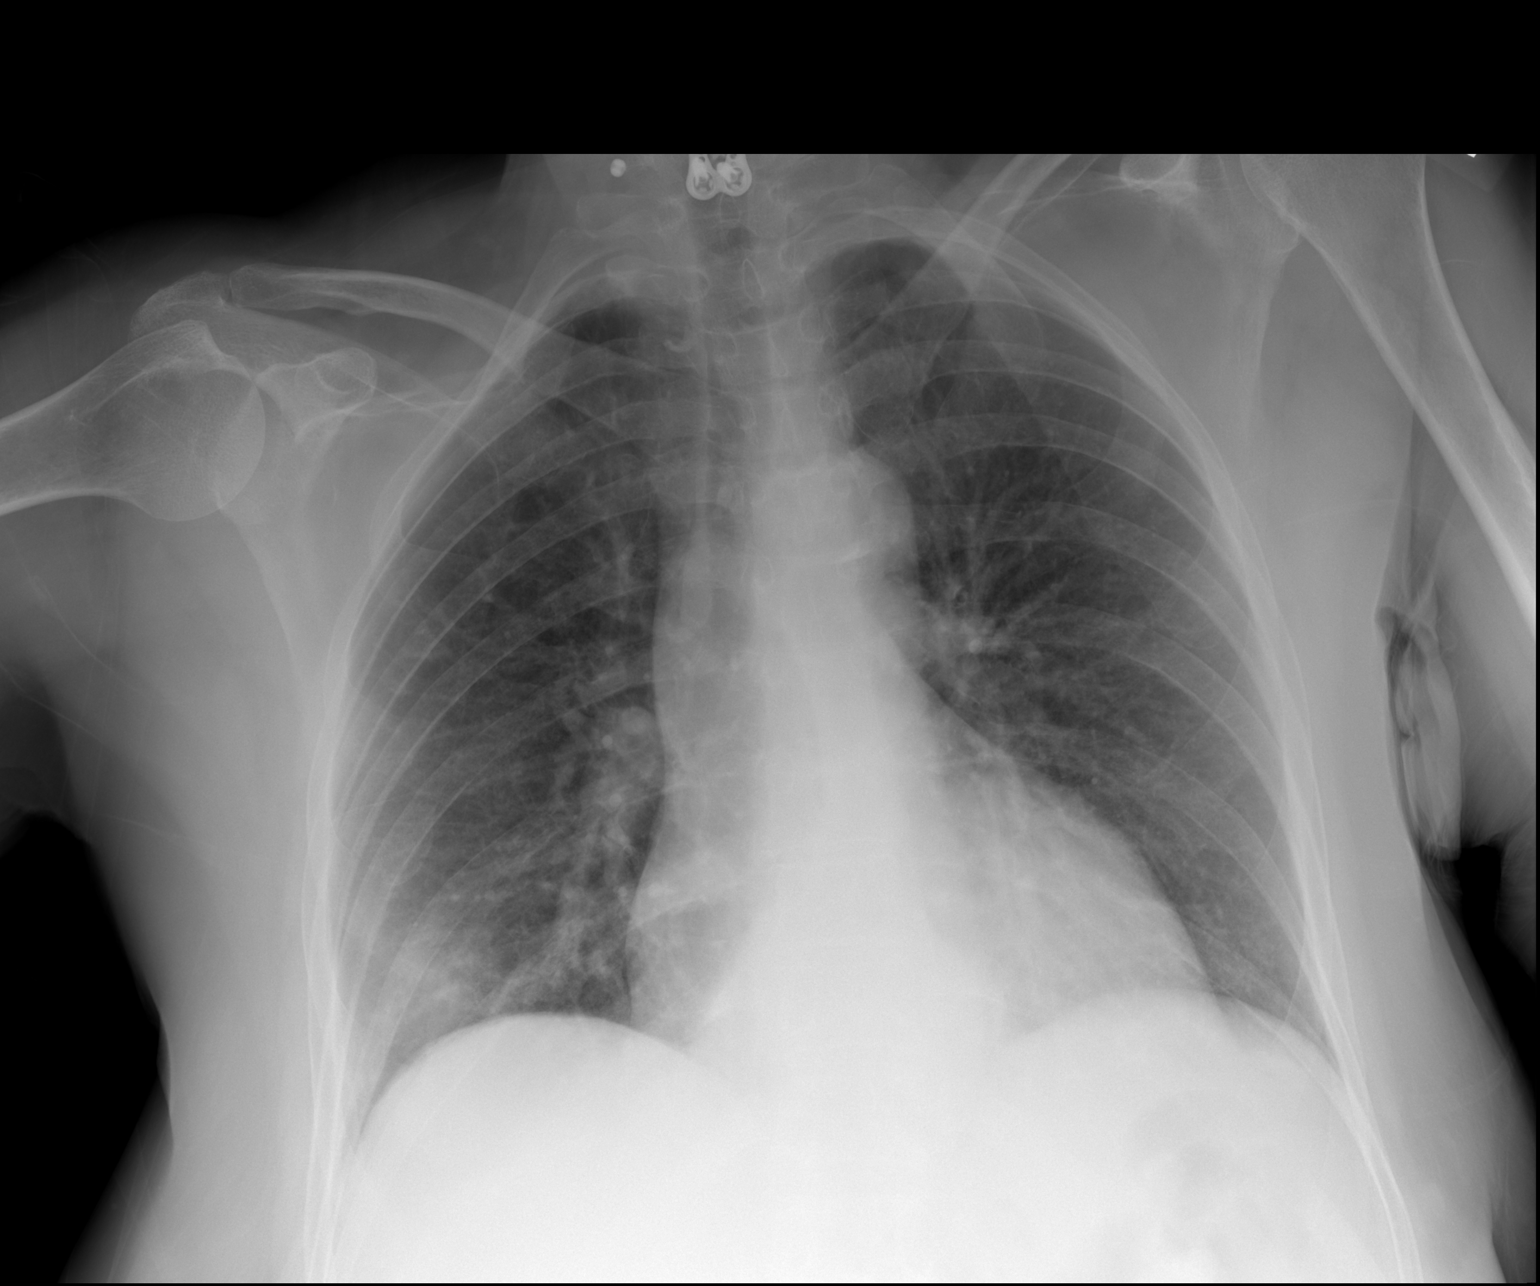

[2 of 2 positions shown; findings below may reference images not displayed]

FINDINGS: There is focal area of increased density in the right lung base
laterally which may indicate focal pneumonia. Left lung is clear.
Normal heart size and pulmonary vascularity. No blunting of
costophrenic angles. No pneumothorax. Mediastinal contours appear
intact. Calcified aorta. Esophageal hiatal hernia behind the heart.
Postoperative changes in the cervical spine.
IMPRESSION: Small focal area of infiltration in the right lung base may indicate
focal pneumonia.

## 2018-08-17 DIAGNOSIS — F900 Attention-deficit hyperactivity disorder, predominantly inattentive type: Secondary | ICD-10-CM | POA: Diagnosis not present

## 2018-08-17 DIAGNOSIS — F310 Bipolar disorder, current episode hypomanic: Secondary | ICD-10-CM | POA: Diagnosis not present

## 2018-12-27 DIAGNOSIS — F4310 Post-traumatic stress disorder, unspecified: Secondary | ICD-10-CM | POA: Diagnosis not present

## 2018-12-27 DIAGNOSIS — F900 Attention-deficit hyperactivity disorder, predominantly inattentive type: Secondary | ICD-10-CM | POA: Diagnosis not present

## 2018-12-27 DIAGNOSIS — F310 Bipolar disorder, current episode hypomanic: Secondary | ICD-10-CM | POA: Diagnosis not present

## 2019-02-11 DIAGNOSIS — R69 Illness, unspecified: Secondary | ICD-10-CM | POA: Diagnosis not present

## 2019-03-08 DIAGNOSIS — H66001 Acute suppurative otitis media without spontaneous rupture of ear drum, right ear: Secondary | ICD-10-CM | POA: Diagnosis not present

## 2019-04-12 DIAGNOSIS — R1013 Epigastric pain: Secondary | ICD-10-CM | POA: Diagnosis not present

## 2019-04-12 DIAGNOSIS — F39 Unspecified mood [affective] disorder: Secondary | ICD-10-CM | POA: Diagnosis not present

## 2019-04-12 DIAGNOSIS — F17210 Nicotine dependence, cigarettes, uncomplicated: Secondary | ICD-10-CM | POA: Diagnosis not present

## 2019-04-12 DIAGNOSIS — K921 Melena: Secondary | ICD-10-CM | POA: Diagnosis not present

## 2019-04-12 DIAGNOSIS — R062 Wheezing: Secondary | ICD-10-CM | POA: Diagnosis not present

## 2019-04-25 DIAGNOSIS — R062 Wheezing: Secondary | ICD-10-CM | POA: Diagnosis not present

## 2019-04-25 DIAGNOSIS — R05 Cough: Secondary | ICD-10-CM | POA: Diagnosis not present

## 2019-04-27 DIAGNOSIS — F17210 Nicotine dependence, cigarettes, uncomplicated: Secondary | ICD-10-CM | POA: Diagnosis not present

## 2019-04-27 DIAGNOSIS — Z713 Dietary counseling and surveillance: Secondary | ICD-10-CM | POA: Diagnosis not present

## 2019-04-27 DIAGNOSIS — N281 Cyst of kidney, acquired: Secondary | ICD-10-CM | POA: Diagnosis not present

## 2019-04-27 DIAGNOSIS — T189XXA Foreign body of alimentary tract, part unspecified, initial encounter: Secondary | ICD-10-CM | POA: Diagnosis not present

## 2019-04-27 DIAGNOSIS — R1013 Epigastric pain: Secondary | ICD-10-CM | POA: Diagnosis not present

## 2019-04-27 DIAGNOSIS — I10 Essential (primary) hypertension: Secondary | ICD-10-CM | POA: Diagnosis not present

## 2019-04-27 DIAGNOSIS — K7460 Unspecified cirrhosis of liver: Secondary | ICD-10-CM | POA: Diagnosis not present

## 2019-04-27 DIAGNOSIS — Z87821 Personal history of retained foreign body fully removed: Secondary | ICD-10-CM | POA: Diagnosis not present

## 2019-04-27 DIAGNOSIS — K449 Diaphragmatic hernia without obstruction or gangrene: Secondary | ICD-10-CM | POA: Diagnosis not present

## 2019-05-12 DIAGNOSIS — Z01812 Encounter for preprocedural laboratory examination: Secondary | ICD-10-CM | POA: Diagnosis not present

## 2019-05-12 DIAGNOSIS — R1084 Generalized abdominal pain: Secondary | ICD-10-CM | POA: Diagnosis not present

## 2019-05-12 DIAGNOSIS — K219 Gastro-esophageal reflux disease without esophagitis: Secondary | ICD-10-CM | POA: Diagnosis not present

## 2019-05-12 DIAGNOSIS — K922 Gastrointestinal hemorrhage, unspecified: Secondary | ICD-10-CM | POA: Diagnosis not present

## 2019-05-12 DIAGNOSIS — Z20828 Contact with and (suspected) exposure to other viral communicable diseases: Secondary | ICD-10-CM | POA: Diagnosis not present

## 2019-05-12 DIAGNOSIS — R109 Unspecified abdominal pain: Secondary | ICD-10-CM | POA: Diagnosis not present
